# Patient Record
Sex: Female | Born: 1980
Health system: Southern US, Community
[De-identification: ages and names within clinical notes are randomized; demographics above are authoritative.]

## PROBLEM LIST (undated history)

## (undated) ENCOUNTER — Emergency Department (HOSPITAL_COMMUNITY): Admission: EM | Payer: Self-pay

## (undated) DIAGNOSIS — F32A Depression, unspecified: Secondary | ICD-10-CM

## (undated) DIAGNOSIS — J45909 Unspecified asthma, uncomplicated: Secondary | ICD-10-CM

## (undated) DIAGNOSIS — I10 Essential (primary) hypertension: Secondary | ICD-10-CM

## (undated) DIAGNOSIS — F329 Major depressive disorder, single episode, unspecified: Secondary | ICD-10-CM

## (undated) DIAGNOSIS — I639 Cerebral infarction, unspecified: Secondary | ICD-10-CM

## (undated) DIAGNOSIS — M109 Gout, unspecified: Secondary | ICD-10-CM

## (undated) HISTORY — DX: Cerebral infarction, unspecified: I63.9

---

## 2017-07-14 ENCOUNTER — Encounter (HOSPITAL_COMMUNITY): Payer: Self-pay

## 2017-07-14 ENCOUNTER — Emergency Department (HOSPITAL_COMMUNITY)
Admission: EM | Admit: 2017-07-14 | Discharge: 2017-07-14 | Disposition: A | Discharge status: Home / Self Care | Payer: Self-pay | Attending: Emergency Medicine | Admitting: Emergency Medicine

## 2017-07-14 ENCOUNTER — Other Ambulatory Visit: Payer: Self-pay

## 2017-07-14 ENCOUNTER — Emergency Department (HOSPITAL_COMMUNITY): Payer: Self-pay

## 2017-07-14 DIAGNOSIS — R51 Headache: Secondary | ICD-10-CM

## 2017-07-14 DIAGNOSIS — I10 Essential (primary) hypertension: Secondary | ICD-10-CM | POA: Insufficient documentation

## 2017-07-14 DIAGNOSIS — H538 Other visual disturbances: Secondary | ICD-10-CM | POA: Insufficient documentation

## 2017-07-14 DIAGNOSIS — Z760 Encounter for issue of repeat prescription: Secondary | ICD-10-CM

## 2017-07-14 DIAGNOSIS — F17210 Nicotine dependence, cigarettes, uncomplicated: Secondary | ICD-10-CM | POA: Insufficient documentation

## 2017-07-14 DIAGNOSIS — H53149 Visual discomfort, unspecified: Secondary | ICD-10-CM | POA: Insufficient documentation

## 2017-07-14 DIAGNOSIS — J4521 Mild intermittent asthma with (acute) exacerbation: Secondary | ICD-10-CM

## 2017-07-14 DIAGNOSIS — R05 Cough: Secondary | ICD-10-CM | POA: Insufficient documentation

## 2017-07-14 DIAGNOSIS — Z79899 Other long term (current) drug therapy: Secondary | ICD-10-CM | POA: Insufficient documentation

## 2017-07-14 DIAGNOSIS — J069 Acute upper respiratory infection, unspecified: Secondary | ICD-10-CM

## 2017-07-14 DIAGNOSIS — R519 Headache, unspecified: Secondary | ICD-10-CM

## 2017-07-14 HISTORY — DX: Essential (primary) hypertension: I10

## 2017-07-14 HISTORY — DX: Depression, unspecified: F32.A

## 2017-07-14 HISTORY — DX: Major depressive disorder, single episode, unspecified: F32.9

## 2017-07-14 HISTORY — DX: Gout, unspecified: M10.9

## 2017-07-14 HISTORY — DX: Unspecified asthma, uncomplicated: J45.909

## 2017-07-14 MED ORDER — PREDNISONE 20 MG PO TABS
60.0000 mg | ORAL_TABLET | Freq: Once | ORAL | Status: AC
  Administered 2017-07-14: 60 mg via ORAL
  Filled 2017-07-14: qty 3

## 2017-07-14 MED ORDER — ALBUTEROL SULFATE HFA 108 (90 BASE) MCG/ACT IN AERS
2.0000 | INHALATION_SPRAY | RESPIRATORY_TRACT | Status: DC | PRN
  Administered 2017-07-14: 2 via RESPIRATORY_TRACT
  Filled 2017-07-14: qty 6.7

## 2017-07-14 MED ORDER — METOCLOPRAMIDE HCL 5 MG/ML IJ SOLN
10.0000 mg | Freq: Once | INTRAMUSCULAR | Status: AC
  Administered 2017-07-14: 10 mg via INTRAVENOUS
  Filled 2017-07-14: qty 2

## 2017-07-14 MED ORDER — ASPIRIN-ACETAMINOPHEN-CAFFEINE 250-250-65 MG PO TABS: 1.0000 | ORAL_TABLET | Freq: Four times a day (QID) | ORAL | 0 refills | Status: DC | PRN

## 2017-07-14 MED ORDER — IPRATROPIUM BROMIDE 0.02 % IN SOLN
0.5000 mg | Freq: Once | RESPIRATORY_TRACT | Status: AC
  Administered 2017-07-14: 0.5 mg via RESPIRATORY_TRACT
  Filled 2017-07-14: qty 2.5

## 2017-07-14 MED ORDER — SODIUM CHLORIDE 0.9 % IV BOLUS (SEPSIS)
500.0000 mL | Freq: Once | INTRAVENOUS | Status: AC
  Administered 2017-07-14: 500 mL via INTRAVENOUS

## 2017-07-14 MED ORDER — DIPHENHYDRAMINE HCL 50 MG/ML IJ SOLN
25.0000 mg | Freq: Once | INTRAMUSCULAR | Status: AC
  Administered 2017-07-14: 25 mg via INTRAVENOUS
  Filled 2017-07-14: qty 1

## 2017-07-14 MED ORDER — ALBUTEROL SULFATE (2.5 MG/3ML) 0.083% IN NEBU
5.0000 mg | INHALATION_SOLUTION | Freq: Once | RESPIRATORY_TRACT | Status: AC
  Administered 2017-07-14: 5 mg via RESPIRATORY_TRACT
  Filled 2017-07-14: qty 6

## 2017-07-14 MED ORDER — LISINOPRIL 20 MG PO TABS
20.0000 mg | ORAL_TABLET | Freq: Once | ORAL | Status: AC
  Administered 2017-07-14: 20 mg via ORAL
  Filled 2017-07-14: qty 1

## 2017-07-14 MED ORDER — LISINOPRIL-HYDROCHLOROTHIAZIDE 20-12.5 MG PO TABS: 1.0000 | ORAL_TABLET | Freq: Every day | ORAL | 0 refills | Status: DC

## 2017-07-14 MED ORDER — AEROCHAMBER PLUS FLO-VU LARGE MISC
1.0000 | Freq: Once | Status: AC
  Administered 2017-07-14: 1
  Filled 2017-07-14 (×2): qty 1

## 2017-07-14 MED ORDER — PREDNISONE 10 MG PO TABS: 40.0000 mg | ORAL_TABLET | Freq: Every day | ORAL | 0 refills | Status: DC

## 2017-07-14 NOTE — ED Triage Notes (Addendum)
Patient c/o intermittent headache x 1 week, sensitivity to light. Patient has a productive cough with yellow sputum x 3 days. Patient states she is out of her Albuterol inhaler. Patient did not want a neb treatment because she gave herself one at home at 0600 today.  Patient was hypertensive in triage, but states she is out of her medication (Lisinopril and HCTZ)

## 2017-07-14 NOTE — ED Notes (Signed)
PA made aware patient refused chest XR.

## 2017-07-14 NOTE — ED Notes (Signed)
ED Provider at bedside. 

## 2017-07-14 NOTE — Discharge Instructions (Signed)
As discussed, use your inhaler with spacer as instructed and as needed.  Try Excedrin for headache or ibuprofen or Tylenol as needed.  Make sure that you stay well-hydrated.  Prednisone 40 mg daily for the next 4 days starting tomorrow.  [facility].  Return if symptoms worsen or new concerning symptoms in the meantime.

## 2017-07-14 NOTE — ED Provider Notes (Signed)
[facility]-EMERGENCY DEPT Provider Note   CSN: [id number] Arrival date & time: 07/14/17  9562     History   Chief Complaint Chief Complaint  Patient presents with  . Headache  . Wheezing  . Medication Refill  . Hypertension    HPI Suzanne Graham is a 37 y.o. female with past medical history significant for asthma, hypertension (noncompliant with medication), and anxiety/depression presenting with 1 week of intermittent left-sided throbbing headache with light sensitivity and 3 days of productive cough.  She also reports associated right-sided blurred vision.  Has had headaches like this before but not lasting as long or as frequent. She has tried ibuprofen for her headaches with relief for a few hours but the headache returns. She started having nasal congestion 5 days ago which improved and now she has been having a productive cough over the last 3 days. she has tried nebulizing treatment at home early this am but denies wheezing or shortness of breath.  She reports being out of her albuterol inhaler. She recently moved from [place] and does not have a PCP in the area.  She is currently out of her lisinopril/HCTZ 20/12.5. Denies chest pain, fever, chills, myalgias, nausea, vomiting.   HPI  Past Medical History:  Diagnosis Date  . Asthma   . Depression   . Gout   . Hypertension     There are no active problems to display for this patient.   History reviewed. No pertinent surgical history.  OB History    No data available       Home Medications    Prior to Admission medications   Medication Sig Start Date End Date Taking? Authorizing Provider  albuterol (PROVENTIL HFA;VENTOLIN HFA) 108 (90 Base) MCG/ACT inhaler Inhale 2 puffs into the lungs every 4 (four) hours as needed for wheezing or shortness of breath.   Yes [provider]  ibuprofen (ADVIL,MOTRIN) 200 MG tablet Take 400 mg by mouth 3 (three) times daily as needed.   Yes [provider]  meloxicam (MOBIC) 15 MG tablet Take 7.5 mg by mouth daily as needed for pain.   Yes [provider]  aspirin-acetaminophen-caffeine (EXCEDRIN MIGRAINE) [phone] MG tablet Take 1 tablet by mouth every 6 (six) hours as needed for headache or migraine. 07/14/17   Georgiana Shore, PA-C  lisinopril-hydrochlorothiazide (ZESTORETIC) 20-12.5 MG tablet Take 1 tablet by mouth daily. 07/14/17 08/13/17  Mathews Robinsons B, PA-C  predniSONE (DELTASONE) 10 MG tablet Take 4 tablets (40 mg total) by mouth daily for 4 days. 07/14/17 07/18/17  Georgiana Shore, PA-C    Family History Family History  Problem Relation Age of Onset  . Heart failure Mother   . Hypertension Mother     Social History Social History   Tobacco Use  . Smoking status: Current Every Day Smoker    Packs/day: 0.50    Types: Cigarettes  . Smokeless tobacco: Never Used  Substance Use Topics  . Alcohol use: Yes    Comment: occasinally  . Drug use: No     Allergies   Patient has no known allergies.   Review of Systems Review of Systems  Constitutional: Negative for chills, diaphoresis and fever.  HENT: Negative for congestion, ear pain, facial swelling, sinus pressure, sinus pain and sore throat.   Eyes: Positive for photophobia and visual disturbance. Negative for pain and redness.  Respiratory: Positive for cough. Negative for choking, chest tightness, shortness of breath, wheezing and stridor.   Cardiovascular: Negative  for chest pain, palpitations and leg swelling.  Gastrointestinal: Negative for abdominal distention, abdominal pain, nausea and vomiting.  Genitourinary: Negative for difficulty urinating, dysuria, flank pain and hematuria.  Musculoskeletal: Negative for gait problem, myalgias, neck pain and neck stiffness.  Skin: Negative for color change, pallor and rash.  Neurological: Positive for headaches. Negative for dizziness, tremors, seizures, syncope, facial asymmetry, speech  difficulty, weakness, light-headedness and numbness.  Psychiatric/Behavioral: Negative for behavioral problems.     Physical Exam Updated Vital Signs BP (!) 180/102   Pulse 75   Temp 98 F (36.7 C) (Oral)   Resp 16   Ht 5\' 4"  (1.626 m)   Wt 108.9 kg (240 lb)   LMP 07/14/2017   SpO2 99%   BMI 41.20 kg/m   Physical Exam  Constitutional: She is oriented to person, place, and time. She appears well-developed and well-nourished.  Non-toxic appearance. She does not appear ill. No distress.  Afebrile, nontoxic appearing in no acute distress, sitting comfortably in bed  HENT:  Head: Normocephalic and atraumatic.  Mouth/Throat: Oropharynx is clear and moist.  Eyes: EOM are normal. Pupils are equal, round, and reactive to light. Right eye exhibits normal extraocular motion and no nystagmus. Left eye exhibits normal extraocular motion and no nystagmus.  No photophobia on exam  Neck: Normal range of motion. Neck supple.  Cardiovascular: Normal rate, regular rhythm, normal heart sounds and intact distal pulses.  No murmur heard. Pulmonary/Chest: Effort normal. No stridor. No respiratory distress. She has wheezes. She has no rales. She exhibits no tenderness.  Diffuse expiratory wheezing bilaterally.  Abdominal: She exhibits no distension.  Musculoskeletal: Normal range of motion. She exhibits no edema or tenderness.  Neurological: She is alert and oriented to person, place, and time. She has normal strength. She is not disoriented. No cranial nerve deficit or sensory deficit. She displays a negative Romberg sign. Coordination and gait normal.  Neurologic Exam:  - Mental status: Patient is alert and cooperative. Fluent speech and words are clear. Coherent thought processes and insight is good. Patient is oriented x 4 to person, place, time and event.  - Cranial nerves:  CN III, IV, VI: pupils equally round, reactive to light both direct and conscensual. Full extra-ocular movement. CN V: motor  temporalis and masseter strength intact. CN VII : muscles of facial expression intact. CN X :  midline uvula. XI strength of sternocleidomastoid and trapezius muscles 5/5, XII: tongue is midline when protruded. - Motor: No involuntary movements. Muscle tone and bulk normal throughout. Muscle strength is 5/5 in bilateral shoulder abduction, elbow flexion and extension, grip, hip extension, flexion, leg flexion and extension, ankle dorsiflexion and plantar flexion.  - Sensory:light tough sensation intact in all extremities.  - Cerebellar: rapid alternating movements and point to point movement intact in upper and lower extremities. Normal stance and gait.  Skin: Skin is warm and dry. She is not diaphoretic. No erythema. No pallor.  Psychiatric: She has a normal mood and affect. Her behavior is normal.  Nursing note and vitals reviewed.    ED Treatments / Results  Labs (all labs ordered are listed, but only abnormal results are displayed) Labs Reviewed - No data to display  EKG  EKG Interpretation None       Radiology No results found.  Procedures Procedures (including critical care time)  Medications Ordered in ED Medications  albuterol (PROVENTIL HFA;VENTOLIN HFA) 108 (90 Base) MCG/ACT inhaler 2 puff (not administered)  AEROCHAMBER PLUS FLO-VU LARGE MISC 1 each (not  administered)  albuterol (PROVENTIL) (2.5 MG/3ML) 0.083% nebulizer solution 5 mg (5 mg Nebulization Given 07/14/17 1734)  ipratropium (ATROVENT) nebulizer solution 0.5 mg (0.5 mg Nebulization Given 07/14/17 1734)  predniSONE (DELTASONE) tablet 60 mg (60 mg Oral Given 07/14/17 1709)  sodium chloride 0.9 % bolus 500 mL (500 mLs Intravenous New Bag/Given 07/14/17 1716)  metoCLOPramide (REGLAN) injection 10 mg (10 mg Intravenous Given 07/14/17 1716)  diphenhydrAMINE (BENADRYL) injection 25 mg (25 mg Intravenous Given 07/14/17 1716)  lisinopril (PRINIVIL,ZESTRIL) tablet 20 mg (20 mg Oral Given 07/14/17 1709)     Initial Impression /  Assessment and Plan / ED Course  I have reviewed the triage vital signs and the nursing notes.  Pertinent labs & imaging results that were available during my care of the patient were reviewed by me and considered in my medical decision making (see chart for details).     Patient presenting with intermittent left-sided throbbing headache with right-sided blurred vision.  She also complains of productive cough for the last 3 days.  Patient recently moved to the area does not have a primary care provider and is noncompliant with medications at this time including antihypertensive and albuterol inhaler.  Reassuring exam, normal neuro Diffuse wheezing bilaterally Elevated blood pressure noted.  We will give migraine cocktail, nebulizing treatment and home antihypertensive meds and reassess  On reassessment, patient reported significant improvement and she stated that she was breathing much better and her headache had completely resolved. Denies any visual changes. Sates that the blurred vision is intermittent and only with the headache. Lungs CTA bilaterally. Blood pressure improving.  Patient was provided with short term refill of her antihypertensive and close follow-up with the wellness center. She was provided with a spacer and instructed on its use.  Discharge home with short course of prednisone and close follow-up with PCP.  Discussed strict return precautions and advised to return to the emergency department if experiencing any new or worsening symptoms. Instructions were understood and patient agreed with discharge plan.  Final Clinical Impressions(s) / ED Diagnoses   Final diagnoses:  Mild intermittent asthma with exacerbation  Medication refill  Viral upper respiratory tract infection  Acute nonintractable headache, unspecified headache type    ED Discharge Orders        Ordered    lisinopril-hydrochlorothiazide (ZESTORETIC) 20-12.5 MG tablet  Daily     07/14/17 1859     predniSONE (DELTASONE) 10 MG tablet  Daily     07/14/17 1859    aspirin-acetaminophen-caffeine (EXCEDRIN MIGRAINE) 250-250-65 MG tablet  Every 6 hours PRN     07/14/17 1859       Georgiana Shore, PA-C 07/14/17 Acquanetta Belling, MD 07/16/17 1424

## 2017-07-17 ENCOUNTER — Other Ambulatory Visit: Payer: Self-pay

## 2017-07-17 ENCOUNTER — Emergency Department (HOSPITAL_COMMUNITY): Payer: Self-pay

## 2017-07-17 ENCOUNTER — Inpatient Hospital Stay (HOSPITAL_COMMUNITY)
Admission: EM | Admit: 2017-07-17 | Discharge: 2017-07-18 | DRG: 066 | Disposition: A | Discharge status: Home Health | Payer: Self-pay | Attending: Internal Medicine | Admitting: Internal Medicine

## 2017-07-17 ENCOUNTER — Encounter (HOSPITAL_COMMUNITY): Payer: Self-pay | Admitting: Emergency Medicine

## 2017-07-17 DIAGNOSIS — R402362 Coma scale, best motor response, obeys commands, at arrival to emergency department: Secondary | ICD-10-CM | POA: Diagnosis present

## 2017-07-17 DIAGNOSIS — I10 Essential (primary) hypertension: Secondary | ICD-10-CM | POA: Diagnosis present

## 2017-07-17 DIAGNOSIS — M722 Plantar fascial fibromatosis: Secondary | ICD-10-CM | POA: Diagnosis present

## 2017-07-17 DIAGNOSIS — M109 Gout, unspecified: Secondary | ICD-10-CM | POA: Diagnosis present

## 2017-07-17 DIAGNOSIS — I63432 Cerebral infarction due to embolism of left posterior cerebral artery: Secondary | ICD-10-CM

## 2017-07-17 DIAGNOSIS — Z79899 Other long term (current) drug therapy: Secondary | ICD-10-CM

## 2017-07-17 DIAGNOSIS — R402252 Coma scale, best verbal response, oriented, at arrival to emergency department: Secondary | ICD-10-CM | POA: Diagnosis present

## 2017-07-17 DIAGNOSIS — E669 Obesity, unspecified: Secondary | ICD-10-CM | POA: Diagnosis present

## 2017-07-17 DIAGNOSIS — H53451 Other localized visual field defect, right eye: Secondary | ICD-10-CM

## 2017-07-17 DIAGNOSIS — R29702 NIHSS score 2: Secondary | ICD-10-CM | POA: Diagnosis present

## 2017-07-17 DIAGNOSIS — R8271 Bacteriuria: Secondary | ICD-10-CM | POA: Diagnosis present

## 2017-07-17 DIAGNOSIS — J45909 Unspecified asthma, uncomplicated: Secondary | ICD-10-CM | POA: Diagnosis present

## 2017-07-17 DIAGNOSIS — F17210 Nicotine dependence, cigarettes, uncomplicated: Secondary | ICD-10-CM | POA: Diagnosis present

## 2017-07-17 DIAGNOSIS — Z6839 Body mass index (BMI) 39.0-39.9, adult: Secondary | ICD-10-CM

## 2017-07-17 DIAGNOSIS — E876 Hypokalemia: Secondary | ICD-10-CM | POA: Diagnosis present

## 2017-07-17 DIAGNOSIS — I639 Cerebral infarction, unspecified: Secondary | ICD-10-CM | POA: Diagnosis present

## 2017-07-17 DIAGNOSIS — R402142 Coma scale, eyes open, spontaneous, at arrival to emergency department: Secondary | ICD-10-CM | POA: Diagnosis present

## 2017-07-17 DIAGNOSIS — F329 Major depressive disorder, single episode, unspecified: Secondary | ICD-10-CM | POA: Diagnosis present

## 2017-07-17 DIAGNOSIS — H53461 Homonymous bilateral field defects, right side: Secondary | ICD-10-CM | POA: Diagnosis present

## 2017-07-17 DIAGNOSIS — I63532 Cerebral infarction due to unspecified occlusion or stenosis of left posterior cerebral artery: Principal | ICD-10-CM | POA: Diagnosis present

## 2017-07-17 LAB — URINALYSIS, ROUTINE W REFLEX MICROSCOPIC
Bilirubin Urine: NEGATIVE
Glucose, UA: NEGATIVE mg/dL
Ketones, ur: NEGATIVE mg/dL
Nitrite: POSITIVE — AB
PROTEIN: NEGATIVE mg/dL
SPECIFIC GRAVITY, URINE: 1.014 (ref 1.005–1.030)
pH: 5 (ref 5.0–8.0)

## 2017-07-17 LAB — CBC
HCT: 37.1 % (ref 36.0–46.0)
HEMATOCRIT: 38.3 % (ref 36.0–46.0)
Hemoglobin: 12.2 g/dL (ref 12.0–15.0)
Hemoglobin: 11.8 g/dL — ABNORMAL LOW (ref 12.0–15.0)
MCH: 24.2 pg — ABNORMAL LOW (ref 26.0–34.0)
MCH: 24.1 pg — ABNORMAL LOW (ref 26.0–34.0)
MCHC: 31.9 g/dL (ref 30.0–36.0)
MCHC: 31.8 g/dL (ref 30.0–36.0)
MCV: 75.8 fL — ABNORMAL LOW (ref 78.0–100.0)
MCV: 75.9 fL — ABNORMAL LOW (ref 78.0–100.0)
PLATELETS: 240 10*3/uL (ref 150–400)
Platelets: 236 10*3/uL (ref 150–400)
RBC: 5.05 MIL/uL (ref 3.87–5.11)
RBC: 4.89 MIL/uL (ref 3.87–5.11)
RDW: 14 % (ref 11.5–15.5)
RDW: 13.9 % (ref 11.5–15.5)
WBC: 7.3 10*3/uL (ref 4.0–10.5)
WBC: 6.8 10*3/uL (ref 4.0–10.5)

## 2017-07-17 LAB — COMPREHENSIVE METABOLIC PANEL
ALBUMIN: 4 g/dL (ref 3.5–5.0)
ALT: 16 U/L (ref 14–54)
AST: 18 U/L (ref 15–41)
Alkaline Phosphatase: 41 U/L (ref 38–126)
Anion gap: 8 (ref 5–15)
BUN: 11 mg/dL (ref 6–20)
CHLORIDE: 102 mmol/L (ref 101–111)
CO2: 28 mmol/L (ref 22–32)
Calcium: 9.8 mg/dL (ref 8.9–10.3)
Creatinine, Ser: 0.99 mg/dL (ref 0.44–1.00)
GFR calc Af Amer: >60 mL/min (ref >60)
GFR calc non Af Amer: >60 mL/min (ref >60)
GLUCOSE: 92 mg/dL (ref 65–99)
POTASSIUM: 3.2 mmol/L — AB (ref 3.5–5.1)
Sodium: 138 mmol/L (ref 135–145)
Total Bilirubin: 0.8 mg/dL (ref 0.3–1.2)
Total Protein: 8.2 g/dL — ABNORMAL HIGH (ref 6.5–8.1)

## 2017-07-17 LAB — DIFFERENTIAL
BASOS ABS: 0 10*3/uL (ref 0.0–0.1)
Basophils Relative: 0 %
EOS ABS: 0.2 10*3/uL (ref 0.0–0.7)
Eosinophils Relative: 3 %
Lymphocytes Relative: 27 %
Lymphs Abs: 2 10*3/uL (ref 0.7–4.0)
MONO ABS: 0.7 10*3/uL (ref 0.1–1.0)
Monocytes Relative: 10 %
Neutro Abs: 4.4 10*3/uL (ref 1.7–7.7)
Neutrophils Relative %: 60 %

## 2017-07-17 LAB — RAPID URINE DRUG SCREEN, HOSP PERFORMED
Amphetamines: NOT DETECTED
BARBITURATES: NOT DETECTED
BENZODIAZEPINES: NOT DETECTED
COCAINE: NOT DETECTED
Opiates: NOT DETECTED
TETRAHYDROCANNABINOL: NOT DETECTED

## 2017-07-17 LAB — I-STAT CHEM 8, ED
BUN: 8 mg/dL (ref 6–20)
CHLORIDE: 100 mmol/L — AB (ref 101–111)
Calcium, Ion: 1.22 mmol/L (ref 1.15–1.40)
Creatinine, Ser: 0.9 mg/dL (ref 0.44–1.00)
GLUCOSE: 92 mg/dL (ref 65–99)
HCT: 42 % (ref 36.0–46.0)
Hemoglobin: 14.3 g/dL (ref 12.0–15.0)
POTASSIUM: 3.2 mmol/L — AB (ref 3.5–5.1)
Sodium: 141 mmol/L (ref 135–145)
TCO2: 27 mmol/L (ref 22–32)

## 2017-07-17 LAB — ETHANOL

## 2017-07-17 LAB — APTT: APTT: 32 s (ref 24–36)

## 2017-07-17 LAB — I-STAT TROPONIN, ED: Troponin i, poc: 0.01 ng/mL (ref 0.00–0.08)

## 2017-07-17 LAB — CREATININE, SERUM
CREATININE: 0.85 mg/dL (ref 0.44–1.00)
GFR calc Af Amer: >60 mL/min (ref >60)

## 2017-07-17 LAB — PROTIME-INR
INR: 1.07
PROTHROMBIN TIME: 13.8 s (ref 11.4–15.2)

## 2017-07-17 MED ORDER — ACETAMINOPHEN 325 MG PO TABS
650.0000 mg | ORAL_TABLET | ORAL | Status: DC | PRN
  Administered 2017-07-18: 650 mg via ORAL
  Filled 2017-07-17: qty 2

## 2017-07-17 MED ORDER — COLCHICINE 0.6 MG PO TABS
1.2000 mg | ORAL_TABLET | Freq: Once | ORAL | Status: AC
  Administered 2017-07-17: 1.2 mg via ORAL
  Filled 2017-07-17: qty 2

## 2017-07-17 MED ORDER — ACETAMINOPHEN 650 MG RE SUPP: 650.0000 mg | RECTAL | Status: DC | PRN

## 2017-07-17 MED ORDER — POTASSIUM CHLORIDE CRYS ER 20 MEQ PO TBCR
40.0000 meq | EXTENDED_RELEASE_TABLET | Freq: Once | ORAL | Status: AC
  Administered 2017-07-17: 40 meq via ORAL
  Filled 2017-07-17: qty 2

## 2017-07-17 MED ORDER — HEPARIN SODIUM (PORCINE) 5000 UNIT/ML IJ SOLN
5000.0000 [IU] | Freq: Three times a day (TID) | INTRAMUSCULAR | Status: DC
  Administered 2017-07-18 (×2): 5000 [IU] via SUBCUTANEOUS
  Filled 2017-07-17 (×3): qty 1

## 2017-07-17 MED ORDER — NICOTINE 14 MG/24HR TD PT24: 14.0000 mg | MEDICATED_PATCH | Freq: Every day | TRANSDERMAL | Status: DC | PRN

## 2017-07-17 MED ORDER — HYDROCHLOROTHIAZIDE 12.5 MG PO CAPS
12.5000 mg | ORAL_CAPSULE | Freq: Every day | ORAL | Status: DC
  Administered 2017-07-18: 12.5 mg via ORAL
  Filled 2017-07-17: qty 1

## 2017-07-17 MED ORDER — SENNOSIDES-DOCUSATE SODIUM 8.6-50 MG PO TABS: 1.0000 | ORAL_TABLET | Freq: Every evening | ORAL | Status: DC | PRN

## 2017-07-17 MED ORDER — DEXTROSE 5 % IV SOLN
1.0000 g | Freq: Once | INTRAVENOUS | Status: AC
  Administered 2017-07-17: 1 g via INTRAVENOUS
  Filled 2017-07-17: qty 10

## 2017-07-17 MED ORDER — ASPIRIN 325 MG PO TABS
325.0000 mg | ORAL_TABLET | Freq: Every day | ORAL | Status: DC
  Administered 2017-07-18: 325 mg via ORAL
  Filled 2017-07-17: qty 1

## 2017-07-17 MED ORDER — LISINOPRIL 20 MG PO TABS
20.0000 mg | ORAL_TABLET | Freq: Every day | ORAL | Status: DC
  Administered 2017-07-18: 20 mg via ORAL
  Filled 2017-07-17: qty 1

## 2017-07-17 MED ORDER — PRAVASTATIN SODIUM 40 MG PO TABS: 80.0000 mg | ORAL_TABLET | Freq: Every day | ORAL | Status: DC

## 2017-07-17 MED ORDER — STROKE: EARLY STAGES OF RECOVERY BOOK
Freq: Once | Status: AC
  Administered 2017-07-17: 23:00:00
  Filled 2017-07-17: qty 1

## 2017-07-17 MED ORDER — SODIUM CHLORIDE 0.9 % IV SOLN
INTRAVENOUS | Status: AC
  Administered 2017-07-18: 01:00:00 via INTRAVENOUS

## 2017-07-17 MED ORDER — HYDRALAZINE HCL 20 MG/ML IJ SOLN: 10.0000 mg | Freq: Three times a day (TID) | INTRAMUSCULAR | Status: DC | PRN

## 2017-07-17 MED ORDER — IOPAMIDOL (ISOVUE-370) INJECTION 76%
INTRAVENOUS | Status: AC
  Administered 2017-07-17: 100 mL via INTRAVENOUS
  Filled 2017-07-17: qty 100

## 2017-07-17 MED ORDER — LISINOPRIL-HYDROCHLOROTHIAZIDE 20-12.5 MG PO TABS
1.0000 | ORAL_TABLET | Freq: Every day | ORAL | Status: DC
Start: 2017-07-17 — End: 2017-07-17

## 2017-07-17 MED ORDER — ASPIRIN 81 MG PO CHEW
324.0000 mg | CHEWABLE_TABLET | Freq: Once | ORAL | Status: AC
  Administered 2017-07-17: 324 mg via ORAL
  Filled 2017-07-17: qty 4

## 2017-07-17 MED ORDER — ACETAMINOPHEN 160 MG/5ML PO SOLN: 650.0000 mg | ORAL | Status: DC | PRN

## 2017-07-17 MED ORDER — PROCHLORPERAZINE MALEATE 10 MG PO TABS
10.0000 mg | ORAL_TABLET | Freq: Once | ORAL | Status: AC
  Administered 2017-07-17: 10 mg via ORAL
  Filled 2017-07-17: qty 1

## 2017-07-17 MED ORDER — LORAZEPAM 2 MG/ML IJ SOLN
1.0000 mg | Freq: Once | INTRAMUSCULAR | Status: AC
  Administered 2017-07-17: 1 mg via INTRAVENOUS
  Filled 2017-07-17: qty 1

## 2017-07-17 NOTE — H&P (Signed)
[facility] History and Physical  [name] ZOX:[id number] DOB: May 15, 1981 DOA: 07/17/2017  Referring physician:  PCP: Patient, No Pcp Per   Chief Complaint: "My vision got blurry."  HPI: Suzanne Graham is a 37 y.o. female with past medical history of asthma, depression, gout and hypertension patient states 2 days ago she woke up with blurry vision.  In her right visual field her vision went in and out the first day.  Then the visual loss became permanent.  No history of visual issues.  Family history of stroke.  Personal history of stroke.  Patient states he also has a headache.   ED Course: EDP consulted optho and neuro.  Review of Systems:  As per HPI otherwise 10 point review of systems negative.    Past Medical History:  Diagnosis Date  . Asthma   . Depression   . Gout   . Hypertension    History reviewed. No pertinent surgical history. Social History:  reports that she has been smoking cigarettes.  She has been smoking about 0.50 packs per day. she has never used smokeless tobacco. She reports that she drinks alcohol. She reports that she does not use drugs.  No Known Allergies  Family History  Problem Relation Age of Onset  . Heart failure Mother   . Hypertension Mother   . Stroke Neg Hx      Prior to Admission medications   Medication Sig Start Date End Date Taking? Authorizing Provider  albuterol (PROVENTIL HFA;VENTOLIN HFA) 108 (90 Base) MCG/ACT inhaler Inhale 2 puffs into the lungs every 4 (four) hours as needed for wheezing or shortness of breath.   Yes [provider]  ibuprofen (ADVIL,MOTRIN) 200 MG tablet Take 400 mg by mouth 3 (three) times daily as needed.   Yes [provider]  lisinopril-hydrochlorothiazide (ZESTORETIC) 20-12.5 MG tablet Take 1 tablet by mouth daily. 07/14/17 08/13/17 Yes Mathews Robinsons B, PA-C  meloxicam (MOBIC) 15 MG tablet Take 7.5 mg by mouth daily as needed for pain.   Yes [provider]    predniSONE (DELTASONE) 10 MG tablet Take 4 tablets (40 mg total) by mouth daily for 4 days. 07/14/17 07/18/17 Yes Georgiana Shore, PA-C  aspirin-acetaminophen-caffeine (EXCEDRIN MIGRAINE) [phone] MG tablet Take 1 tablet by mouth every 6 (six) hours as needed for headache or migraine. 07/14/17   Georgiana Shore, PA-C   Physical Exam: Vitals:   07/17/17 1703 07/17/17 1705 07/17/17 1824 07/17/17 1825  BP:  (!) 163/110 (!) 177/105   Pulse: 74 75 81 81  Resp: 19 15 (!) 21 (!) 21  Temp:      TempSrc:      SpO2: 100% 100% 100% 98%    Wt Readings from Last 3 Encounters:  07/14/17 108.9 kg (240 lb)    General:  Appears calm and comfortable; A&Ox3 Eyes:  PERRL, EOMI, normal lids, iris ENT:  grossly normal hearing, lips & tongue Neck:  no LAD, masses or thyromegaly Cardiovascular:  RRR, no m/r/g. No LE edema.  Respiratory:  CTA bilaterally, no w/r/r. Normal respiratory effort. Abdomen:  soft, ntnd Skin:  no rash or induration seen on limited exam Musculoskeletal:  grossly normal tone BUE/BLE Psychiatric:  grossly normal mood and affect, speech fluent and appropriate Neurologic:  CN 2-12 grossly intact, moves all extremities in coordinated fashion.          Labs on Admission:  Basic Metabolic Panel: Recent Labs  Lab 07/17/17 1638 07/17/17 1648  NA 138 141  K 3.2*  3.2*  CL 102 100*  CO2 28  --   GLUCOSE 92 92  BUN 11 8  CREATININE 0.99 0.90  CALCIUM 9.8  --    Liver Function Tests: Recent Labs  Lab 07/17/17 1638  AST 18  ALT 16  ALKPHOS 41  BILITOT 0.8  PROT 8.2*  ALBUMIN 4.0   No results for input(s): LIPASE, AMYLASE in the last 168 hours. No results for input(s): AMMONIA in the last 168 hours. CBC: Recent Labs  Lab 07/17/17 1638 07/17/17 1648  WBC 7.3  --   NEUTROABS 4.4  --   HGB 12.2 14.3  HCT 38.3 42.0  MCV 75.8*  --   PLT 240  --    Cardiac Enzymes: No results for input(s): CKTOTAL, CKMB, CKMBINDEX, TROPONINI in the last 168 hours.  BNP  (last 3 results) No results for input(s): BNP in the last 8760 hours.  ProBNP (last 3 results) No results for input(s): PROBNP in the last 8760 hours.   Serum creatinine: 0.9 mg/dL 16/04/9600/10/19 [id number] Estimated creatinine clearance: 102.2 mL/min  CBG: No results for input(s): GLUCAP in the last 168 hours.  Radiological Exams on Admission: Ct Angio Head W Or Wo Contrast  Result Date: 07/17/2017 CLINICAL DATA:  37 y/o  F; vision changes. EXAM: CT ANGIOGRAPHY HEAD AND NECK TECHNIQUE: Multidetector CT imaging of the head and neck was performed using the standard protocol during bolus administration of intravenous contrast. Multiplanar CT image reconstructions and MIPs were obtained to evaluate the vascular anatomy. Carotid stenosis measurements (when applicable) are obtained utilizing NASCET criteria, using the distal internal carotid diameter as the denominator. CONTRAST:  100mL ISOVUE-370 IOPAMIDOL (ISOVUE-370) INJECTION 76% COMPARISON:  Same-day MRI of the brain. FINDINGS: CTA NECK FINDINGS Aortic arch: Bovine variant branching. Imaged portion shows no evidence of aneurysm or dissection. No significant stenosis of the major arch vessel origins. Mild calcific atherosclerosis. Right carotid system: No evidence of dissection, stenosis (50% or greater) or occlusion. Left carotid system: No evidence of dissection, stenosis (50% or greater) or occlusion. Vertebral arteries: Left dominant. No evidence of dissection, stenosis (50% or greater) or occlusion. Skeleton: Negative. Other neck: Negative. Upper chest: 13 mm left cheek dermal cyst. Review of the MIP images confirms the above findings CTA HEAD FINDINGS Anterior circulation: No significant stenosis, proximal occlusion, aneurysm, or vascular malformation. Posterior circulation: Bilateral P2 short segments of occlusion/near occlusion with distal reconstitution (series 12, image 23 and 17 as well as series 8, image 103). Otherwise no large vessel occlusion,  aneurysm, or significant stenosis is identified. Venous sinuses: As permitted by contrast timing, patent. Anatomic variants: Anterior and bilateral posterior communicating arteries are present. Delayed phase: No abnormal intracranial enhancement. Review of the MIP images confirms the above findings IMPRESSION: 1. Negative CTA of the neck. 2. Bilateral P2 short segments of occlusion/near occlusion with distal reconstitution. Given patient's age and symmetry consideration for vasculitis, reversible cerebral vasoconstriction syndrome, or vasospasm should be considered in addition to thromboembolic disease. 3. Otherwise negative CTA of the head. These results were called by telephone at the time of interpretation on 07/17/2017 at 6:19 pm to Dr. Erma HeritageIsaacs , who verbally acknowledged these results. Electronically Signed   By: Mitzi HansenLance  Furusawa-Stratton M.D.   On: 07/17/2017 18:31   Ct Head Wo Contrast  Result Date: 07/17/2017 CLINICAL DATA:  Right eye blurred vision. EXAM: CT HEAD WITHOUT CONTRAST TECHNIQUE: Contiguous axial images were obtained from the base of the skull through the vertex without intravenous contrast. COMPARISON:  None. FINDINGS:  Brain: Low-density noted in the left occipital lobe compatible with infarction. No hemorrhage or hydrocephalus. No mass lesion. Vascular: No hyperdense vessel or unexpected calcification. Skull: No acute calvarial abnormality. Sinuses/Orbits: No acute finding. Other: None IMPRESSION: Low-density in the left occipital lobe compatible with acute to subacute infarct. Electronically Signed   By: Charlett Nose M.D.   On: 07/17/2017 15:49   Ct Angio Neck W Or Wo Contrast  Result Date: 07/17/2017 CLINICAL DATA:  37 y/o  F; vision changes. EXAM: CT ANGIOGRAPHY HEAD AND NECK TECHNIQUE: Multidetector CT imaging of the head and neck was performed using the standard protocol during bolus administration of intravenous contrast. Multiplanar CT image reconstructions and MIPs were obtained to  evaluate the vascular anatomy. Carotid stenosis measurements (when applicable) are obtained utilizing NASCET criteria, using the distal internal carotid diameter as the denominator. CONTRAST:  ISOVUE-370 IOPAMIDOL (ISOVUE-370) INJECTION 76% COMPARISON:  Same-day MRI of the brain. FINDINGS: CTA NECK FINDINGS Aortic arch: Bovine variant branching. Imaged portion shows no evidence of aneurysm or dissection. No significant stenosis of the major arch vessel origins. Mild calcific atherosclerosis. Right carotid system: No evidence of dissection, stenosis (50% or greater) or occlusion. Left carotid system: No evidence of dissection, stenosis (50% or greater) or occlusion. Vertebral arteries: Left dominant. No evidence of dissection, stenosis (50% or greater) or occlusion. Skeleton: Negative. Other neck: Negative. Upper chest: 13 mm left cheek dermal cyst. Review of the MIP images confirms the above findings CTA HEAD FINDINGS Anterior circulation: No significant stenosis, proximal occlusion, aneurysm, or vascular malformation. Posterior circulation: Bilateral P2 short segments of occlusion/near occlusion with distal reconstitution (series 12, image 23 and 17 as well as series 8, image 103). Otherwise no large vessel occlusion, aneurysm, or significant stenosis is identified. Venous sinuses: As permitted by contrast timing, patent. Anatomic variants: Anterior and bilateral posterior communicating arteries are present. Delayed phase: No abnormal intracranial enhancement. Review of the MIP images confirms the above findings IMPRESSION: 1. Negative CTA of the neck. 2. Bilateral P2 short segments of occlusion/near occlusion with distal reconstitution. Given patient's age and symmetry consideration for vasculitis, reversible cerebral vasoconstriction syndrome, or vasospasm should be considered in addition to thromboembolic disease. 3. Otherwise negative CTA of the head. These results were called by telephone at the time of  interpretation on 07/17/2017 at 6:19 pm to Dr. Erma Heritage , who verbally acknowledged these results. Electronically Signed   By: Mitzi Hansen M.D.   On: 07/17/2017 18:31   Mr Brain Wo Contrast  Result Date: 07/17/2017 CLINICAL DATA:  37 y/o F; right-sided eye blurriness and visual field changes. EXAM: MRI HEAD WITHOUT CONTRAST TECHNIQUE: Multiplanar, multiecho pulse sequences of the brain and surrounding structures were obtained without intravenous contrast. COMPARISON:  07/17/2016 CT head.  CT angiogram of head. FINDINGS: Brain: Multiple foci of reduced diffusion are present throughout the left PCA distribution involving left medial temporal lobe, occipital lobe, left splenium of corpus callosum, and left thalamus compatible with acute/early subacute infarction. Areas of infarction demonstrate T2 FLAIR hyperintense signal abnormality. No significant mass effect or hemorrhage. Fewnonspecific foci of T2 FLAIR hyperintense signal abnormality in subcortical and periventricular white matter are compatible withmildchronic microvascular ischemic changes for age. No extra-axial collection, hydrocephalus, or effacement of basilar cisterns. No herniation. Vascular: Refer to same-day CT angiogram of head. Skull and upper cervical spine: Normal marrow signal. Sinuses/Orbits: Moderate maxillary and anterior ethmoid sinus mucosal thickening. No abnormal signal of mastoid air cells. Orbits are unremarkable. Other: None. IMPRESSION: Several areas of acute/early  subacute infarction within the left posterior cerebral artery distribution. No hemorrhage or mass effect. Electronically Signed   By: Mitzi Hansen M.D.   On: 07/17/2017 18:16    EKG: Independently reviewed.  Normal sinus rhythm.  T wave inversion in anterior lateral leads. No STEMI.  Assessment/Plan Active Problems:   Stroke [facility])   Stroke CT head negative for acute bleed, showed stroke Right lateral hemianopsia of R eye MRI ordered and  showed stroke Aspirin full dose given  And daily A1c ordered Lipid panel ordered Admitted to telemetry bed Echo ordered Carotid Dopplers ordered MRA ordered showed P2 narrowing bil Stroke team to follow patient Started statin EDP consulted neurology who will see patient in the morning  Low K Given in ED Will recheck in AM  UA abn Dirty catch pt w/o symptoms, no further abx  Hypertension When necessary hydralazine 10 mg IV as needed for severe blood pressure Cont Prinzide  L ankle gout attack Colchcine given Hold steroids  Tobacco dependence Nicotine patch prn  Plantar fascitis F/u as OP  Depression No SI/HI Will monitor    Code Status: FC  DVT Prophylaxis: heparin Family Communication: MOP at bedside Disposition Plan: Pending Improvement  Status: tele inpt  Suzanne Salter, MD Family Medicine [facility] [email or web address] Password [email or web address]

## 2017-07-17 NOTE — ED Provider Notes (Signed)
[facility]-EMERGENCY DEPT Provider Note   CSN: [id number] Arrival date & time: 07/17/17  1102     History   Chief Complaint Chief Complaint  Patient presents with  . Blurred Vision    HPI Suzanne Graham is a 37 y.o. female.  HPI   Reports blurred vision, began 3 days ago, initially it was intermittent and associated with headaches. Initially only had it with headache and it would go away when headache went away. No hx of known migraines. Now for 2 days has been noticing constant feeling of darkness in peripheral vision of the right eye.  Aching left sided headache. Comes and goes. Now having vision change even without headache.  Nothing makes headache or vision worse.  No eye pain.  No eye drainage.  No numbness or weakness one one side other. Has gout acting up in foot.  No trauma.  No family or personal hx of MS.   Smokes, hx of htn has been off of medications Also has had recent URI symptoms which are improving Had been seen for combination of symptoms above 3 days ago, was asymptomatic prior to discharge and had resolution of visual changes with resolution of headache and htn    Past Medical History:  Diagnosis Date  . Asthma   . Depression   . Gout   . Hypertension     There are no active problems to display for this patient.   History reviewed. No pertinent surgical history.  OB History    No data available       Home Medications    Prior to Admission medications   Medication Sig Start Date End Date Taking? Authorizing Provider  albuterol (PROVENTIL HFA;VENTOLIN HFA) 108 (90 Base) MCG/ACT inhaler Inhale 2 puffs into the lungs every 4 (four) hours as needed for wheezing or shortness of breath.   Yes [provider]  ibuprofen (ADVIL,MOTRIN) 200 MG tablet Take 400 mg by mouth 3 (three) times daily as needed.   Yes [provider]  lisinopril-hydrochlorothiazide (ZESTORETIC) 20-12.5 MG tablet Take 1 tablet by mouth  daily. 07/14/17 08/13/17 Yes Mathews Robinsons B, PA-C  meloxicam (MOBIC) 15 MG tablet Take 7.5 mg by mouth daily as needed for pain.   Yes [provider]  predniSONE (DELTASONE) 10 MG tablet Take 4 tablets (40 mg total) by mouth daily for 4 days. 07/14/17 07/18/17 Yes Georgiana Shore, PA-C  aspirin-acetaminophen-caffeine (EXCEDRIN MIGRAINE) [phone] MG tablet Take 1 tablet by mouth every 6 (six) hours as needed for headache or migraine. 07/14/17   Georgiana Shore, PA-C    Family History Family History  Problem Relation Age of Onset  . Heart failure Mother   . Hypertension Mother     Social History Social History   Tobacco Use  . Smoking status: Current Every Day Smoker    Packs/day: 0.50    Types: Cigarettes  . Smokeless tobacco: Never Used  Substance Use Topics  . Alcohol use: Yes    Comment: occasinally  . Drug use: No     Allergies   Patient has no known allergies.   Review of Systems Review of Systems  Constitutional: Negative for fever.  HENT: Positive for congestion (4 days).   Eyes: Positive for visual disturbance. Negative for photophobia, pain and discharge.  Respiratory: Positive for cough. Negative for shortness of breath.   Cardiovascular: Negative for chest pain.  Gastrointestinal: Negative for abdominal pain, nausea and vomiting.  Genitourinary: Negative for dysuria.  Musculoskeletal: Positive  for arthralgias.  Neurological: Positive for headaches. Negative for dizziness, speech difficulty, weakness and numbness.     Physical Exam Updated Vital Signs BP (!) 163/110 (BP Location: Left Arm)   Pulse 75   Temp 98.8 F (37.1 C) (Oral)   Resp 15   LMP 07/14/2017   SpO2 100%   Physical Exam  Constitutional: She is oriented to person, place, and time. She appears well-developed and well-nourished. No distress.  HENT:  Head: Normocephalic and atraumatic.  Eyes: Conjunctivae and EOM are normal.  Neck: Normal range of motion.    Cardiovascular: Normal rate, regular rhythm, normal heart sounds and intact distal pulses. Exam reveals no gallop and no friction rub.  No murmur heard. Pulmonary/Chest: Effort normal and breath sounds normal. No respiratory distress. She has no wheezes. She has no rales.  Abdominal: Soft. She exhibits no distension. There is no tenderness. There is no guarding.  Musculoskeletal: She exhibits no edema or tenderness.  Neurological: She is alert and oriented to person, place, and time. She has normal strength. No cranial nerve deficit or sensory deficit. Coordination and gait (walking with crutch now due to gout pain, difficult to assess) normal. GCS eye subscore is 4. GCS verbal subscore is 5. GCS motor subscore is 6.  Sensation of right sided visual field deficit in right eye, when tested initially reports she can see fingers, then says she cannot  Skin: Skin is warm and dry. No rash noted. She is not diaphoretic. No erythema.  Nursing note and vitals reviewed.    ED Treatments / Results  Labs (all labs ordered are listed, but only abnormal results are displayed) Labs Reviewed  I-STAT CHEM 8, ED - Abnormal; Notable for the following components:      Result Value   Potassium 3.2 (*)    Chloride 100 (*)    All other components within normal limits  ETHANOL  PROTIME-INR  APTT  CBC  DIFFERENTIAL  COMPREHENSIVE METABOLIC PANEL  RAPID URINE DRUG SCREEN, HOSP PERFORMED  URINALYSIS, ROUTINE W REFLEX MICROSCOPIC  I-STAT TROPONIN, ED    EKG  EKG Interpretation  Date/Time:  Thursday July 17 2017 16:33:54 EST Ventricular Rate:  71 PR Interval:    QRS Duration: 91 QT Interval:  400 QTC Calculation: 435 R Axis:   75 Text Interpretation:  Sinus rhythm Probable LVH with secondary repol abnrm No previous ECGs available Confirmed by Alvira MondaySchlossman, Mollyann Halbert ([id number]) on 07/17/2017 5:14:49 PM       Radiology Ct Head Wo Contrast  Result Date: 07/17/2017 CLINICAL DATA:  Right eye blurred  vision. EXAM: CT HEAD WITHOUT CONTRAST TECHNIQUE: Contiguous axial images were obtained from the base of the skull through the vertex without intravenous contrast. COMPARISON:  None. FINDINGS: Brain: Low-density noted in the left occipital lobe compatible with infarction. No hemorrhage or hydrocephalus. No mass lesion. Vascular: No hyperdense vessel or unexpected calcification. Skull: No acute calvarial abnormality. Sinuses/Orbits: No acute finding. Other: None IMPRESSION: Low-density in the left occipital lobe compatible with acute to subacute infarct. Electronically Signed   By: Charlett NoseKevin  Dover M.D.   On: 07/17/2017 15:49    Procedures Procedures (including critical care time)  Medications Ordered in ED Medications  prochlorperazine (COMPAZINE) tablet 10 mg (10 mg Oral Given 07/17/17 1652)  LORazepam (ATIVAN) injection 1 mg (1 mg Intravenous Given 07/17/17 1703)     Initial Impression / Assessment and Plan / ED Course  I have reviewed the triage vital signs and the nursing notes.  Pertinent labs & imaging  results that were available during my care of the patient were reviewed by me and considered in my medical decision making (see chart for details).     37 year old female with a history of asthma, hypertension, recently not on medications, who presents with concern for right sided eye blurriness and visual field changes.  Patient had been seen 3 days ago, with multiple concerns at that time, which included right eye blurriness that would come and go in association with her headache, and had resolved prior to discharge.  She reports for the last 2 days, and now the symptoms has been constant, and this time notes more of a right visual fields problem, and constant symptoms at this time that are not associated with her headache.  CT head was done which shows signs of acute or subacute CVA.  Discussed with Dr. Amada Jupiter of neurology.  Will plan to admit to [facility] for further stroke care.   Ordered MRI, CT Angio of head and neck for further evaluation.  Signed out to Dr. Erma Heritage with labwork and hospitalist consult for admission pending. Discussed plan with patient in detail.   Final Clinical Impressions(s) / ED Diagnoses   Final diagnoses:  Loss of peripheral visual field, right  Occipital stroke [facility])    ED Discharge Orders    None       Alvira Monday, MD 07/17/17 [phone]

## 2017-07-17 NOTE — ED Notes (Signed)
Patient c/o of blurred vision. Patient explains that everything is blurry even up close.

## 2017-07-17 NOTE — ED Provider Notes (Signed)
Assumed care from Dr. Dalene SeltzerSchlossman at 4:45 PM. Briefly, the patient is a 37 y.o. female with PMHx of  has a past medical history of Asthma, Depression, Gout, and Hypertension. here with new occipital stroke. Right sided peripheral vision impaired. Had HA x 2 days. CT scan is c/f subacute infarct.   Labs Reviewed  CBC - Abnormal; Notable for the following components:      Result Value   MCV 75.8 (*)    MCH 24.2 (*)    All other components within normal limits  COMPREHENSIVE METABOLIC PANEL - Abnormal; Notable for the following components:   Potassium 3.2 (*)    Total Protein 8.2 (*)    All other components within normal limits  URINALYSIS, ROUTINE W REFLEX MICROSCOPIC - Abnormal; Notable for the following components:   APPearance CLOUDY (*)    Hgb urine dipstick SMALL (*)    Nitrite POSITIVE (*)    Leukocytes, UA LARGE (*)    Bacteria, UA RARE (*)    Squamous Epithelial / LPF 6-30 (*)    All other components within normal limits  I-STAT CHEM 8, ED - Abnormal; Notable for the following components:   Potassium 3.2 (*)    Chloride 100 (*)    All other components within normal limits  ETHANOL  PROTIME-INR  APTT  DIFFERENTIAL  RAPID URINE DRUG SCREEN, HOSP PERFORMED  I-STAT TROPONIN, ED    Course of Care: -Lab work shows mild hypokalemia which has been replaced.  Patient also with possible UTI and will treat as such.  Will plan [facility] hospitalist [facility].     Suzanne Graham, Suzanne Trem[place], MD 07/17/17 [phone]

## 2017-07-17 NOTE — ED Notes (Signed)
EKG given to Schlossman,MD.

## 2017-07-17 NOTE — ED Notes (Signed)
Patient calls out for headache pain. Suzanne PicklesMade Schlossman, MD aware. Verbal compazine put in managed orders. Alerted pharmacy to verify and send medication.

## 2017-07-17 NOTE — ED Triage Notes (Signed)
Pt verbalizes continued right eye blurriness since seen three days ago; "they gave me something for my blood pressure but did nothing for my eye." Pt denies change in vision since last seen "it just isn't getting better."

## 2017-07-17 NOTE — ED Notes (Signed)
Patient in MRI 

## 2017-07-17 NOTE — ED Notes (Signed)
Waiting for pharmacy to send COMPAZINE.

## 2017-07-17 NOTE — ED Notes (Signed)
ED TO INPATIENT HANDOFF REPORT  Name/Age/Gender Suzanne Graham 37 y.o. female  Code Status    Code Status Orders  (From admission, onward)        Start     Ordered   07/17/17 2134  Full code  Continuous     07/17/17 2135    Code Status History    Date Active Date Inactive Code Status Order ID Comments User Context   This patient has a current code status but no historical code status.      Home/SNF/Other Home  Chief Complaint vision trouble  Level of Care/Admitting Diagnosis ED Disposition    ED Disposition Condition [place]: [place] [[id number]]  Level of Care: Telemetry [5]  Diagnosis: Stroke [facility]) [[id number]]  Admitting Physician: Elwin Mocha [[id number]]  Attending Physician: Elwin Mocha [[id number]]  Estimated length of stay: 3 - 4 days  Certification:: I certify this patient will need inpatient services for at least 2 midnights  PT Class (Do Not Modify): Inpatient [101]  PT Acc Code (Do Not Modify): Private [1]       Medical History Past Medical History:  Diagnosis Date  . Asthma   . Depression   . Gout   . Hypertension     Allergies No Known Allergies  IV Location/Drains/Wounds Patient Lines/Drains/Airways Status   Active Line/Drains/Airways    Name:   Placement date:   Placement time:   Site:   Days:   Peripheral IV 07/17/17 Left Antecubital   07/17/17    1941    Antecubital   less than 1          Labs/Imaging Results for orders placed or performed during the hospital encounter of 07/17/17 (from the past 48 hour(s))  Urine rapid drug screen (hosp performed)     Status: None   Collection Time: 07/17/17  4:29 PM  Result Value Ref Range   Opiates NONE DETECTED NONE DETECTED   Cocaine NONE DETECTED NONE DETECTED   Benzodiazepines NONE DETECTED NONE DETECTED   Amphetamines NONE DETECTED NONE DETECTED   Tetrahydrocannabinol NONE DETECTED NONE DETECTED   Barbiturates NONE DETECTED NONE DETECTED     Comment: (NOTE) DRUG SCREEN FOR MEDICAL PURPOSES ONLY.  IF CONFIRMATION IS NEEDED FOR ANY PURPOSE, NOTIFY LAB WITHIN 5 DAYS. LOWEST DETECTABLE LIMITS FOR URINE DRUG SCREEN Drug Class                     Cutoff (ng/mL) Amphetamine and metabolites    1000 Barbiturate and metabolites    200 Benzodiazepine                 937 Tricyclics and metabolites     300 Opiates and metabolites        300 Cocaine and metabolites        300 THC                            50   Urinalysis, Routine w reflex microscopic     Status: Abnormal   Collection Time: 07/17/17  4:29 PM  Result Value Ref Range   Color, Urine YELLOW YELLOW   APPearance CLOUDY (A) CLEAR   Specific Gravity, Urine 1.014 1.005 - 1.030   pH 5.0 5.0 - 8.0   Glucose, UA NEGATIVE NEGATIVE mg/dL   Hgb urine dipstick SMALL (A) NEGATIVE   Bilirubin Urine NEGATIVE NEGATIVE   Ketones, ur NEGATIVE  NEGATIVE mg/dL   Protein, ur NEGATIVE NEGATIVE mg/dL   Nitrite POSITIVE (A) NEGATIVE   Leukocytes, UA LARGE (A) NEGATIVE   RBC / HPF 6-30 0 - 5 RBC/hpf   WBC, UA 6-30 0 - 5 WBC/hpf   Bacteria, UA RARE (A) NONE SEEN   Squamous Epithelial / LPF 6-30 (A) NONE SEEN   Mucus PRESENT    Uric Acid Crys, UA PRESENT   Ethanol     Status: None   Collection Time: 07/17/17  4:38 PM  Result Value Ref Range   Alcohol, Ethyl (B) <10 <10 mg/dL    Comment:        LOWEST DETECTABLE LIMIT FOR SERUM ALCOHOL IS 10 mg/dL FOR MEDICAL PURPOSES ONLY   Protime-INR     Status: None   Collection Time: 07/17/17  4:38 PM  Result Value Ref Range   Prothrombin Time 13.8 11.4 - 15.2 seconds   INR 1.07   APTT     Status: None   Collection Time: 07/17/17  4:38 PM  Result Value Ref Range   aPTT 32 24 - 36 seconds  CBC     Status: Abnormal   Collection Time: 07/17/17  4:38 PM  Result Value Ref Range   WBC 7.3 4.0 - 10.5 K/uL   RBC 5.05 3.87 - 5.11 MIL/uL   Hemoglobin 12.2 12.0 - 15.0 g/dL   HCT 38.3 36.0 - 46.0 %   MCV 75.8 (L) 78.0 - 100.0 fL   MCH 24.2 (L)  26.0 - 34.0 pg   MCHC 31.9 30.0 - 36.0 g/dL   RDW 14.0 11.5 - 15.5 %   Platelets 240 150 - 400 K/uL  Differential     Status: None   Collection Time: 07/17/17  4:38 PM  Result Value Ref Range   Neutrophils Relative % 60 %   Lymphocytes Relative 27 %   Monocytes Relative 10 %   Eosinophils Relative 3 %   Basophils Relative 0 %   Neutro Abs 4.4 1.7 - 7.7 K/uL   Lymphs Abs 2.0 0.7 - 4.0 K/uL   Monocytes Absolute 0.7 0.1 - 1.0 K/uL   Eosinophils Absolute 0.2 0.0 - 0.7 K/uL   Basophils Absolute 0.0 0.0 - 0.1 K/uL   Smear Review MORPHOLOGY UNREMARKABLE     Comment: PLATELET COUNT CONFIRMED BY SMEAR  Comprehensive metabolic panel     Status: Abnormal   Collection Time: 07/17/17  4:38 PM  Result Value Ref Range   Sodium 138 135 - 145 mmol/L   Potassium 3.2 (L) 3.5 - 5.1 mmol/L   Chloride 102 101 - 111 mmol/L   CO2 28 22 - 32 mmol/L   Glucose, Bld 92 65 - 99 mg/dL   BUN 11 6 - 20 mg/dL   Creatinine, Ser 0.99 0.44 - 1.00 mg/dL   Calcium 9.8 8.9 - 10.3 mg/dL   Total Protein 8.2 (H) 6.5 - 8.1 g/dL   Albumin 4.0 3.5 - 5.0 g/dL   AST 18 15 - 41 U/L   ALT 16 14 - 54 U/L   Alkaline Phosphatase 41 38 - 126 U/L   Total Bilirubin 0.8 0.3 - 1.2 mg/dL   GFR calc non Af Amer >60 >60 mL/min   GFR calc Af Amer >60 >60 mL/min    Comment: (NOTE) The eGFR has been calculated using the CKD EPI equation. This calculation has not been validated in all clinical situations. eGFR's persistently <60 mL/min signify possible Chronic Kidney Disease.    Anion gap 8  5 - 15  I-stat troponin, ED     Status: None   Collection Time: 07/17/17  4:45 PM  Result Value Ref Range   Troponin i, poc 0.01 0.00 - 0.08 ng/mL   Comment 3            Comment: Due to the release kinetics of cTnI, a negative result within the first hours of the onset of symptoms does not rule out myocardial infarction with certainty. If myocardial infarction is still suspected, repeat the test at appropriate intervals.   I-Stat Chem  8, ED     Status: Abnormal   Collection Time: 07/17/17  4:48 PM  Result Value Ref Range   Sodium 141 135 - 145 mmol/L   Potassium 3.2 (L) 3.5 - 5.1 mmol/L   Chloride 100 (L) 101 - 111 mmol/L   BUN 8 6 - 20 mg/dL   Creatinine, Ser 0.90 0.44 - 1.00 mg/dL   Glucose, Bld 92 65 - 99 mg/dL   Calcium, Ion 1.22 1.15 - 1.40 mmol/L   TCO2 27 22 - 32 mmol/L   Hemoglobin 14.3 12.0 - 15.0 g/dL   HCT 42.0 36.0 - 46.0 %  CBC     Status: Abnormal   Collection Time: 07/17/17  9:34 PM  Result Value Ref Range   WBC 6.8 4.0 - 10.5 K/uL   RBC 4.89 3.87 - 5.11 MIL/uL   Hemoglobin 11.8 (L) 12.0 - 15.0 g/dL   HCT 37.1 36.0 - 46.0 %   MCV 75.9 (L) 78.0 - 100.0 fL   MCH 24.1 (L) 26.0 - 34.0 pg   MCHC 31.8 30.0 - 36.0 g/dL   RDW 13.9 11.5 - 15.5 %   Platelets 236 150 - 400 K/uL  Creatinine, serum     Status: None   Collection Time: 07/17/17  9:34 PM  Result Value Ref Range   Creatinine, Ser 0.85 0.44 - 1.00 mg/dL   GFR calc non Af Amer >60 >60 mL/min   GFR calc Af Amer >60 >60 mL/min    Comment: (NOTE) The eGFR has been calculated using the CKD EPI equation. This calculation has not been validated in all clinical situations. eGFR's persistently <60 mL/min signify possible Chronic Kidney Disease.    Ct Angio Head W Or Wo Contrast  Result Date: 07/17/2017 CLINICAL DATA:  37 y/o  F; vision changes. EXAM: CT ANGIOGRAPHY HEAD AND NECK TECHNIQUE: Multidetector CT imaging of the head and neck was performed using the standard protocol during bolus administration of intravenous contrast. Multiplanar CT image reconstructions and MIPs were obtained to evaluate the vascular anatomy. Carotid stenosis measurements (when applicable) are obtained utilizing NASCET criteria, using the distal internal carotid diameter as the denominator. CONTRAST:  176m ISOVUE-370 IOPAMIDOL (ISOVUE-370) INJECTION 76% COMPARISON:  Same-day MRI of the brain. FINDINGS: CTA NECK FINDINGS Aortic arch: Bovine variant branching. Imaged portion  shows no evidence of aneurysm or dissection. No significant stenosis of the major arch vessel origins. Mild calcific atherosclerosis. Right carotid system: No evidence of dissection, stenosis (50% or greater) or occlusion. Left carotid system: No evidence of dissection, stenosis (50% or greater) or occlusion. Vertebral arteries: Left dominant. No evidence of dissection, stenosis (50% or greater) or occlusion. Skeleton: Negative. Other neck: Negative. Upper chest: 13 mm left cheek dermal cyst. Review of the MIP images confirms the above findings CTA HEAD FINDINGS Anterior circulation: No significant stenosis, proximal occlusion, aneurysm, or vascular malformation. Posterior circulation: Bilateral P2 short segments of occlusion/near occlusion with distal reconstitution (series 12,  image 23 and 17 as well as series 8, image 103). Otherwise no large vessel occlusion, aneurysm, or significant stenosis is identified. Venous sinuses: As permitted by contrast timing, patent. Anatomic variants: Anterior and bilateral posterior communicating arteries are present. Delayed phase: No abnormal intracranial enhancement. Review of the MIP images confirms the above findings IMPRESSION: 1. Negative CTA of the neck. 2. Bilateral P2 short segments of occlusion/near occlusion with distal reconstitution. Given patient's age and symmetry consideration for vasculitis, reversible cerebral vasoconstriction syndrome, or vasospasm should be considered in addition to thromboembolic disease. 3. Otherwise negative CTA of the head. These results were called by telephone at the time of interpretation on 07/17/2017 at 6:19 pm to Dr. Ellender Hose , who verbally acknowledged these results. Electronically Signed   By: Kristine Garbe M.D.   On: 07/17/2017 18:31   Ct Head Wo Contrast  Result Date: 07/17/2017 CLINICAL DATA:  Right eye blurred vision. EXAM: CT HEAD WITHOUT CONTRAST TECHNIQUE: Contiguous axial images were obtained from the base of  the skull through the vertex without intravenous contrast. COMPARISON:  None. FINDINGS: Brain: Low-density noted in the left occipital lobe compatible with infarction. No hemorrhage or hydrocephalus. No mass lesion. Vascular: No hyperdense vessel or unexpected calcification. Skull: No acute calvarial abnormality. Sinuses/Orbits: No acute finding. Other: None IMPRESSION: Low-density in the left occipital lobe compatible with acute to subacute infarct. Electronically Signed   By: Rolm Baptise M.D.   On: 07/17/2017 15:49   Ct Angio Neck W Or Wo Contrast  Result Date: 07/17/2017 CLINICAL DATA:  37 y/o  F; vision changes. EXAM: CT ANGIOGRAPHY HEAD AND NECK TECHNIQUE: Multidetector CT imaging of the head and neck was performed using the standard protocol during bolus administration of intravenous contrast. Multiplanar CT image reconstructions and MIPs were obtained to evaluate the vascular anatomy. Carotid stenosis measurements (when applicable) are obtained utilizing NASCET criteria, using the distal internal carotid diameter as the denominator. CONTRAST:  144m ISOVUE-370 IOPAMIDOL (ISOVUE-370) INJECTION 76% COMPARISON:  Same-day MRI of the brain. FINDINGS: CTA NECK FINDINGS Aortic arch: Bovine variant branching. Imaged portion shows no evidence of aneurysm or dissection. No significant stenosis of the major arch vessel origins. Mild calcific atherosclerosis. Right carotid system: No evidence of dissection, stenosis (50% or greater) or occlusion. Left carotid system: No evidence of dissection, stenosis (50% or greater) or occlusion. Vertebral arteries: Left dominant. No evidence of dissection, stenosis (50% or greater) or occlusion. Skeleton: Negative. Other neck: Negative. Upper chest: 13 mm left cheek dermal cyst. Review of the MIP images confirms the above findings CTA HEAD FINDINGS Anterior circulation: No significant stenosis, proximal occlusion, aneurysm, or vascular malformation. Posterior circulation:  Bilateral P2 short segments of occlusion/near occlusion with distal reconstitution (series 12, image 23 and 17 as well as series 8, image 103). Otherwise no large vessel occlusion, aneurysm, or significant stenosis is identified. Venous sinuses: As permitted by contrast timing, patent. Anatomic variants: Anterior and bilateral posterior communicating arteries are present. Delayed phase: No abnormal intracranial enhancement. Review of the MIP images confirms the above findings IMPRESSION: 1. Negative CTA of the neck. 2. Bilateral P2 short segments of occlusion/near occlusion with distal reconstitution. Given patient's age and symmetry consideration for vasculitis, reversible cerebral vasoconstriction syndrome, or vasospasm should be considered in addition to thromboembolic disease. 3. Otherwise negative CTA of the head. These results were called by telephone at the time of interpretation on 07/17/2017 at 6:19 pm to Dr. IEllender Hose, who verbally acknowledged these results. Electronically Signed   By: LMia Creek  Furusawa-Stratton M.D.   On: 07/17/2017 18:31   Mr Brain Wo Contrast  Result Date: 07/17/2017 CLINICAL DATA:  37 y/o F; right-sided eye blurriness and visual field changes. EXAM: MRI HEAD WITHOUT CONTRAST TECHNIQUE: Multiplanar, multiecho pulse sequences of the brain and surrounding structures were obtained without intravenous contrast. COMPARISON:  07/17/2016 CT head.  CT angiogram of head. FINDINGS: Brain: Multiple foci of reduced diffusion are present throughout the left PCA distribution involving left medial temporal lobe, occipital lobe, left splenium of corpus callosum, and left thalamus compatible with acute/early subacute infarction. Areas of infarction demonstrate T2 FLAIR hyperintense signal abnormality. No significant mass effect or hemorrhage. Fewnonspecific foci of T2 FLAIR hyperintense signal abnormality in subcortical and periventricular white matter are compatible withmildchronic microvascular  ischemic changes for age. No extra-axial collection, hydrocephalus, or effacement of basilar cisterns. No herniation. Vascular: Refer to same-day CT angiogram of head. Skull and upper cervical spine: Normal marrow signal. Sinuses/Orbits: Moderate maxillary and anterior ethmoid sinus mucosal thickening. No abnormal signal of mastoid air cells. Orbits are unremarkable. Other: None. IMPRESSION: Several areas of acute/early subacute infarction within the left posterior cerebral artery distribution. No hemorrhage or mass effect. Electronically Signed   By: Kristine Garbe M.D.   On: 07/17/2017 18:16    Pending Labs Unresulted Labs (From admission, onward)   Start     Ordered   07/18/17 0500  Hemoglobin A1c  Tomorrow morning,   R     07/17/17 2135   07/18/17 0500  Lipid panel  Tomorrow morning,   R    Comments:  Fasting    07/17/17 2135   07/17/17 2134  HIV antibody (Routine Testing)  Once,   R     07/17/17 2135   07/17/17 1745  Urine culture  STAT,   STAT    Question:  Patient immune status  Answer:  Normal   07/17/17 1744      Vitals/Pain Today's Vitals   07/17/17 1705 07/17/17 1824 07/17/17 1825 07/17/17 2131  BP: (!) 163/110 (!) 177/105  (!) 156/90  Pulse: 75 81 81 83  Resp: 15 (!) 21 (!) 21 (!) 21  Temp:      TempSrc:      SpO2: 100% 100% 98% 99%    Isolation Precautions No active isolations  Medications Medications  pravastatin (PRAVACHOL) tablet 80 mg (not administered)  nicotine (NICODERM CQ - dosed in mg/24 hours) patch 14 mg (not administered)  colchicine tablet 1.2 mg (not administered)  hydrALAZINE (APRESOLINE) injection 10 mg (not administered)  aspirin tablet 325 mg (not administered)  lisinopril-hydrochlorothiazide (PRINZIDE,ZESTORETIC) 20-12.5 MG per tablet 1 tablet (not administered)   stroke: mapping our early stages of recovery book (not administered)  0.9 %  sodium chloride infusion (not administered)  acetaminophen (TYLENOL) tablet 650 mg (not  administered)    Or  acetaminophen (TYLENOL) solution 650 mg (not administered)    Or  acetaminophen (TYLENOL) suppository 650 mg (not administered)  senna-docusate (Senokot-S) tablet 1 tablet (not administered)  heparin injection 5,000 Units (not administered)  prochlorperazine (COMPAZINE) tablet 10 mg (10 mg Oral Given 07/17/17 1652)  LORazepam (ATIVAN) injection 1 mg (1 mg Intravenous Given 07/17/17 1703)  iopamidol (ISOVUE-370) 76 % injection (100 mLs Intravenous Contrast Given 07/17/17 1758)  potassium chloride SA (K-DUR,KLOR-CON) CR tablet 40 mEq (40 mEq Oral Given 07/17/17 1817)  cefTRIAXone (ROCEPHIN) 1 g in dextrose 5 % 50 mL IVPB (0 g Intravenous Stopped 07/17/17 1851)  aspirin chewable tablet 324 mg (324 mg Oral Given 07/17/17 1919)  Mobility walks  

## 2017-07-17 NOTE — ED Notes (Signed)
Stroke swallow screen done at 1707.

## 2017-07-17 NOTE — ED Notes (Signed)
Called pharmacy to check on compazine medication.

## 2017-07-18 ENCOUNTER — Inpatient Hospital Stay (HOSPITAL_COMMUNITY): Payer: Self-pay

## 2017-07-18 ENCOUNTER — Other Ambulatory Visit: Payer: Self-pay

## 2017-07-18 DIAGNOSIS — I63432 Cerebral infarction due to embolism of left posterior cerebral artery: Secondary | ICD-10-CM

## 2017-07-18 DIAGNOSIS — Z720 Tobacco use: Secondary | ICD-10-CM

## 2017-07-18 DIAGNOSIS — I639 Cerebral infarction, unspecified: Secondary | ICD-10-CM

## 2017-07-18 LAB — BASIC METABOLIC PANEL
ANION GAP: 10 (ref 5–15)
BUN: 7 mg/dL (ref 6–20)
CALCIUM: 9.8 mg/dL (ref 8.9–10.3)
CO2: 26 mmol/L (ref 22–32)
Chloride: 101 mmol/L (ref 101–111)
Creatinine, Ser: 0.97 mg/dL (ref 0.44–1.00)
GFR calc Af Amer: >60 mL/min (ref >60)
GLUCOSE: 101 mg/dL — AB (ref 65–99)
POTASSIUM: 3.6 mmol/L (ref 3.5–5.1)
Sodium: 137 mmol/L (ref 135–145)

## 2017-07-18 LAB — ECHOCARDIOGRAM COMPLETE
HEIGHTINCHES: 64 in
Weight: 3714.31 oz

## 2017-07-18 LAB — LIPID PANEL
CHOLESTEROL: 162 mg/dL (ref 0–200)
HDL: 48 mg/dL (ref >40)
LDL CALC: 98 mg/dL (ref 0–99)
Total CHOL/HDL Ratio: 3.4 RATIO
Triglycerides: 80 mg/dL (ref <150)
VLDL: 16 mg/dL (ref 0–40)

## 2017-07-18 LAB — HIV ANTIBODY (ROUTINE TESTING W REFLEX): HIV Screen 4th Generation wRfx: NONREACTIVE

## 2017-07-18 LAB — HEMOGLOBIN A1C
HEMOGLOBIN A1C: 5.1 % (ref 4.8–5.6)
Mean Plasma Glucose: 99.67 mg/dL

## 2017-07-18 LAB — SEDIMENTATION RATE: Sed Rate: 47 mm/hr — ABNORMAL HIGH (ref 0–22)

## 2017-07-18 LAB — ANTITHROMBIN III: ANTITHROMB III FUNC: 93 % (ref 75–120)

## 2017-07-18 MED ORDER — ATORVASTATIN CALCIUM 20 MG PO TABS: 20.0000 mg | ORAL_TABLET | Freq: Every day | ORAL | 0 refills | Status: DC

## 2017-07-18 MED ORDER — ATORVASTATIN CALCIUM 10 MG PO TABS: 20.0000 mg | ORAL_TABLET | Freq: Every day | ORAL | Status: DC

## 2017-07-18 MED ORDER — NICOTINE 14 MG/24HR TD PT24: 14.0000 mg | MEDICATED_PATCH | Freq: Every day | TRANSDERMAL | 0 refills | Status: DC | PRN

## 2017-07-18 MED ORDER — ALBUTEROL SULFATE (2.5 MG/3ML) 0.083% IN NEBU: 2.5000 mg | INHALATION_SOLUTION | RESPIRATORY_TRACT | Status: DC | PRN

## 2017-07-18 MED ORDER — ASPIRIN 325 MG PO TABS: 325.0000 mg | ORAL_TABLET | Freq: Every day | ORAL | 0 refills | Status: DC

## 2017-07-18 MED ORDER — COLCHICINE 0.6 MG PO TABS
0.6000 mg | ORAL_TABLET | Freq: Once | ORAL | Status: AC
  Administered 2017-07-18: 0.6 mg via ORAL
  Filled 2017-07-18: qty 1

## 2017-07-18 NOTE — Progress Notes (Signed)
Gave patient AVS information to sign...Marland Kitchen.answered all discharge information.  Patient has no questions at this time.  Awaiting ride.

## 2017-07-18 NOTE — Progress Notes (Signed)
*  PRELIMINARY RESULTS* Vascular Ultrasound Transcranial Doppler with Bubbles has been completed with Dr. Pearlean BrownieSethi. There is no evidence of high intensity transient signals (HITS) at rest or with Valsalva maneuver. Therefore, there is no evidence of patent foramen ovale (PFO).  Bilateral lower extremity venous duplex completed. Bilateral lower extremities are negative for deep vein thrombosis. There is no evidence of Baker's cyst bilaterally.  07/18/2017 4:08 PM Gertie FeyMichelle Koichi Platte, BS, RVT, RDCS, RDMS

## 2017-07-18 NOTE — Care Management Note (Signed)
Case Management Note  Patient Details  Name: Suzanne Graham MRN: [id number] Date of Birth: 11/02/1980  Subjective/Objective:  Pt admitted with CVA. She is from home with her mother. Pt has no PCP listed or insurance.                   Action/Plan: Pt states [facility] and has an appointment Feb 1st. CM will follow up with the clinics on Monday since they are currently closed.  [facility]  Pt has 2 prescriptions at d/c. CM cant assist with the nicotine patch. CM provided her with Good [employer] card to assist with the cost of the lipitor. Pt feels she can afford the medication with the discount card.  No DME needs.  Pt states her mother will provide transportation home.   Expected Discharge Date:  07/18/17               Expected Discharge Plan:  Home w Home Health Services  In-House Referral:     Discharge planning Services  CM Consult  Post Acute Care Choice:  Home Health Choice offered to:  Patient  DME Arranged:    DME Agency:     HH Arranged:  PT, OT, Speech Therapy HH Agency:  [facility](charity)  Status of Service:  Completed, signed off  If discussed at Long Length of Stay Meetings, dates discussed:    Additional Comments:  Kermit BaloKelli F Biruk Troia, RN 07/18/2017, 4:49 PM

## 2017-07-18 NOTE — Evaluation (Signed)
Occupational Therapy Evaluation Patient Details Name: Suzanne Graham MRN: [id number] DOB: 07/20/1980 Today's Date: 07/18/2017    History of Present Illness 37 y.o. female past history of asthma, depression and anxiety, gout and hypertension, along with chronic headaches, who presented to the emergency room for evaluation of headache and blurry vision. MRI of the brain was done that showed several areas of acute/early subacute infarct within the left PCA distribution.  She was admitted for further stroke workup.   Clinical Impression   Pt admitted with the above diagnoses and presents with below problem list. Pt will benefit from continued acute OT to address the below listed deficits and maximize independence with ADLs prior to d/c home. 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 Pt presents with visual changes impaciting ADLs. Pt also with 1 LOB ambulating in room when she encountered an obstacle (sink) on her right side, min A to correct balance. Overall pt is supervision to min guard with ADLs and functional transfers. Discussed visual compensation strategies for R hemianopsia to maximize safety in the home.      Follow Up Recommendations  Home health OT   Equipment Recommendations  None recommended by OT    Recommendations for Other Services PT consult     Precautions / Restrictions Restrictions Weight Bearing Restrictions: No      Mobility Bed Mobility Overal bed mobility: Modified Independent                Transfers Overall transfer level: Modified independent Equipment used: (1 crutch, pt reports using sometimes due to gout)             General transfer comment: supervision for safety    Balance Overall balance assessment: Needs assistance         Standing balance support: Single extremity supported;No upper extremity supported Standing balance-Leahy Scale: Fair Standing balance comment: fair  static, wide BOS and occasional external support sought during dynamic tasks.                            ADL either performed or assessed with clinical judgement   ADL Overall ADL's : Needs assistance/impaired Eating/Feeding: Set up;Sitting   Grooming: Set up;Sitting   Upper Body Bathing: Set up;Sitting   Lower Body Bathing: Min guard;Sit to/from stand   Upper Body Dressing : Set up;Sitting   Lower Body Dressing: Min guard;Sit to/from stand   Toilet Transfer: Min guard;Ambulation   Toileting- Clothing Manipulation and Hygiene: Set up;Sit to/from stand;Supervision/safety   Tub/ Shower Transfer: Tub transfer;Min guard;Ambulation   Functional mobility during ADLs: Min guard;Minimal assistance General ADL Comments: Pt with 1x LOB around obstacle (sink) on right side. Pt able to correct with min A from therapist. Otherwise min guard for safety.      Vision Baseline Vision/History: ("I'm suppose to wear glasses but I don't.") Patient Visual Report: Peripheral vision impairment;Other (comment)(right eye periphery) Vision Assessment?: Yes Eye Alignment: Within Functional Limits Ocular Range of Motion: Within Functional Limits Alignment/Gaze Preference: Within Defined Limits Visual Fields: Right homonymous hemianopsia     Perception     Praxis      Pertinent Vitals/Pain Pain Assessment: Faces Faces Pain Scale: Hurts even more Pain Location: headache Pain Descriptors / Indicators: Aching Pain Intervention(s): Monitored during session;Patient requesting pain meds-RN notified     Hand Dominance     Extremity/Trunk Assessment Upper Extremity Assessment Upper Extremity Assessment: Overall [facility]  for tasks assessed   Lower Extremity Assessment Lower Extremity Assessment: Defer to PT evaluation       Communication Communication Communication: No difficulties   Cognition Arousal/Alertness: Awake/alert Behavior During Therapy: WFL for tasks  assessed/performed Overall Cognitive Status: Within Functional Limits for tasks assessed                                     General Comments  Pt's mother present throughout session.     Exercises     Shoulder Instructions      Home Living Family/patient expects to be discharged to:: Private residence Living Arrangements: Parent Available Help at Discharge: Family Type of Home: House Home Access: Stairs to enter Secretary/administrator of Steps: 2   Home Layout: One level     Bathroom Shower/Tub: Tub/shower unit             Additional Comments: works at [employer] center, sorting and directing direction of items.      Prior Functioning/Environment Level of Independence: Independent                 OT Problem List: Impaired balance (sitting and/or standing);Impaired vision/perception;Decreased knowledge of use of DME or AE;Decreased knowledge of precautions      OT Treatment/Interventions: Self-care/ADL training;DME and/or AE instruction;Therapeutic activities;Visual/perceptual remediation/compensation;Balance training;Patient/family education    OT Goals(Current goals can be found in the care plan section) Acute Rehab OT Goals Patient Stated Goal: back to normal OT Goal Formulation: With patient Time For Goal Achievement: 07/25/17 Potential to Achieve Goals: Good ADL Goals Pt Will Perform Grooming: with modified independence;standing Pt Will Transfer to Toilet: with modified independence;ambulating Pt Will Perform Tub/Shower Transfer: Tub transfer;with modified independence;ambulating Additional ADL Goal #1: Pt will independently demonstrate 2 visual compensation strategies to improve safety with ADLs.  OT Frequency: Min 2X/week   Barriers to D/C:            Co-evaluation              AM-PAC PT "6 Clicks" Daily Activity     Outcome Measure Help from another person eating meals?: None Help from another person taking care of  personal grooming?: A Little Help from another person toileting, which includes using toliet, bedpan, or urinal?: A Little Help from another person bathing (including washing, rinsing, drying)?: A Little Help from another person to put on and taking off regular upper body clothing?: None Help from another person to put on and taking off regular lower body clothing?: A Little 6 Click Score: 20   End of Session Nurse Communication: Patient requests pain meds;Other (comment)(headache)  Activity Tolerance: Patient tolerated treatment well Patient left: in bed;with call bell/phone within reach;with nursing/sitter in room  OT Visit Diagnosis: Unsteadiness on feet (R26.81);Low vision, both eyes (H54.2)                Time: 1610-9604 OT Time Calculation (min): 22 min Charges:  OT General Charges $OT Visit: 1 Visit OT Evaluation $OT Eval Low Complexity: 1 Low G-Codes:       Pilar Grammes 07/18/2017, 1:23 PM

## 2017-07-18 NOTE — Evaluation (Signed)
Speech Language Pathology Evaluation Patient Details Name: Suzanne Graham MRN: [id number] DOB: 04/30/1981 Today's Date: 07/18/2017 Time: [id number] SLP Time Calculation (min) (ACUTE ONLY): 23 min  Problem List:  Patient Active Problem List   Diagnosis Date Noted  . Stroke [facility](HCC) 07/17/2017   Past Medical History:  Past Medical History:  Diagnosis Date  . Asthma   . Depression   . Gout   . Hypertension    Past Surgical History: History reviewed. No pertinent surgical history. HPI:  37 y.o. female past history of asthma, depression and anxiety, gout and hypertension, along with chronic headaches, who presented to the emergency room for evaluation of headache and blurry vision. MRI of the brain was done that showed several areas of acute/early subacute infarct within the left PCA distribution.  She was admitted for further stroke workup.   Assessment / Plan / Recommendation Clinical Impression   Pt presents with mild-moderate cognitive deficits characterized by decreased storage and retrieval of information, decreased recall of new information, decreased executive functioning, decreased safety awareness, decreased emergent awareness and decreased functional problem solving.  Pt scored 16/30 on the [employer] standardized cognitive assessment (N >/=26) and needed mod assist on evaluation tasks.  Pt's mother reports pt to be more forgetful and notes that pt "repeats herself" more often than at baseline.  Recommend HH ST follow up and 24/7 supervision at discharge.  ST will also follow up acutely for further ongoing diagnostic treatment of cognition.      SLP Assessment  SLP Recommendation/Assessment: Patient needs continued Speech Lanaguage Pathology Services SLP Visit Diagnosis: Cognitive communication deficit (R41.841)    Follow Up Recommendations  Home health SLP;24 hour supervision/assistance    Frequency and Duration min 1 x/week         SLP Evaluation Cognition  Overall Cognitive  Status: Impaired/Different from baseline Arousal/Alertness: Awake/alert Orientation Level: Oriented X4 Attention: Selective Selective Attention: Impaired Selective Attention Impairment: Verbal basic;Functional basic Memory: Impaired Memory Impairment: Retrieval deficit;Decreased recall of new information Awareness: Impaired Awareness Impairment: Emergent impairment Problem Solving: Impaired Problem Solving Impairment: Functional basic Executive Function: Organizing;Decision Making;Self Monitoring;Self Correcting Organizing: Impaired Organizing Impairment: Functional basic Decision Making: Impaired Decision Making Impairment: Functional basic;Verbal basic Self Monitoring: Impaired Self Monitoring Impairment: Functional basic Self Correcting: Impaired Self Correcting Impairment: Functional basic Behaviors: Impulsive Safety/Judgment: Impaired       Comprehension  Auditory Comprehension Overall Auditory Comprehension: Appears within functional limits for tasks assessed    Expression Expression Primary Mode of Expression: Verbal Verbal Expression Overall Verbal Expression: Appears within functional limits for tasks assessed   Oral / Motor  Oral Motor/Sensory Function Overall Oral Motor/Sensory Function: Within functional limits Motor Speech Overall Motor Speech: Appears within functional limits for tasks assessed   GO                    Maryjane HurterPage, Lil Lepage L 07/18/2017, 1:57 PM

## 2017-07-18 NOTE — Evaluation (Addendum)
Physical Therapy Evaluation Patient Details Name: Suzanne Graham MRN: [id number] DOB: [birth year] Today's Date: 07/18/2017   History of Present Illness  37 y.o. female past history of asthma, depression and anxiety, gout and hypertension, along with chronic headaches, who presented to the emergency room for evaluation of headache and blurry vision. MRI of the brain was done that showed several areas of acute/early subacute infarct within the left PCA distribution.  She was admitted for further stroke workup.  Clinical Impression  Pt admitted with above diagnosis. Pt currently with functional limitations due to the deficits listed below (see PT Problem List). 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 Pt is currently limited in her safe mobility by visual field deficits and decreased safety awareness. Pt is currently mod I for bed mobility, supervision for transfers and min guard for ambulation with single crutch to offload pressure on L foot secondary to gout flair up. Pt also min guard for ascent/descent of 3 steps with use of bilateral handrails. Pt educated in [facility] stroke symptomology and need to call 911 if she experiences any symptoms in the future. Pt requires vc for scanning Pt will benefit from skilled PT to increase their independence and safety with mobility to allow discharge to the venue listed below.       Follow Up Recommendations Home health PT;Supervision/Assistance - 24 hour    Equipment Recommendations  None recommended by PT    Recommendations for Other Services       Precautions / Restrictions Restrictions Weight Bearing Restrictions: No      Mobility  Bed Mobility Overal bed mobility: Modified Independent                Transfers Overall transfer level: Needs assistance Equipment used: Right platform walker(1 crutch, pt reports using sometimes due to gout) Transfers: Sit to/from Stand Sit to Stand: Supervision          General transfer comment: supervision for safety, good power up and steadying   Ambulation/Gait Ambulation/Gait assistance: Min guard Ambulation Distance (Feet): 150 Feet Assistive device: Crutches(1 crutch ) Gait Pattern/deviations: Step-through pattern;Decreased weight shift to left;Decreased stance time - left;Shuffle;Antalgic;Drifts right/left Gait velocity: slowed Gait velocity interpretation: Below normal speed for age/gender General Gait Details: hands on min guard for safety, antalgic gait with decreased weightbearing through L LE secondary to L foot gout, vc for visual scanning of environment to compensate for visual field deficit to account for obstacles in her pathway  Stairs Stairs: Yes Stairs assistance: Min guard Stair Management: Two rails;Forwards;Step to pattern Number of Stairs: 3 General stair comments: hands on min guard for safety with ascent/descent of 3 steps, vc for sequencing         Balance Overall balance assessment: Needs assistance Sitting-balance support: Feet supported Sitting balance-Leahy Scale: Good     Standing balance support: Single extremity supported;No upper extremity supported Standing balance-Leahy Scale: Poor Standing balance comment: pt requires crutch to maintain balance in dynamic activity                             Pertinent Vitals/Pain Pain Assessment: 0-10 Pain Score: 8  Faces Pain Scale: Hurts even more Pain Location: headache(L foot with ambulation ) Pain Descriptors / Indicators: Aching;Throbbing Pain Intervention(s): Monitored during session    Home Living Family/patient expects to be discharged to:: Private residence Living Arrangements: Parent Available Help at Discharge: Family Type of Home: House Home Access: Stairs to enter  Entrance Stairs-Number of Steps: 2 Home Layout: One level   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    Prior Function  Level of Independence: Independent                  Extremity/Trunk Assessment   Upper Extremity Assessment Upper Extremity Assessment: Overall WFL for tasks assessed    Lower Extremity Assessment Lower Extremity Assessment: Overall WFL for tasks assessed       Communication   Communication: No difficulties  Cognition Arousal/Alertness: Awake/alert Behavior During Therapy: WFL for tasks assessed/performed Overall Cognitive Status: Within Functional Limits for tasks assessed                                        General Comments General comments (skin integrity, edema, etc.): Pt's mother present throughout session.         Assessment/Plan    PT Assessment Patient needs continued PT services  PT Problem List Decreased activity tolerance;Decreased mobility;Decreased safety awareness;Decreased balance;Pain       PT Treatment Interventions DME instruction;Gait training;Stair training;Functional mobility training;Therapeutic activities;Therapeutic exercise;Balance training;Patient/family education;Cognitive remediation;Neuromuscular re-education    PT Goals (Current goals can be found in the Care Plan section)  Acute Rehab PT Goals Patient Stated Goal: back to normal PT Goal Formulation: With patient Time For Goal Achievement: 08/01/17 Potential to Achieve Goals: Good    Frequency Min 4X/week    AM-PAC PT "6 Clicks" Daily Activity  Outcome Measure Difficulty turning over in bed (including adjusting bedclothes, sheets and blankets)?: A Little Difficulty moving from lying on back to sitting on the side of the bed? : A Little Difficulty sitting down on and standing up from a chair with arms (e.g., wheelchair, bedside commode, etc,.)?: A Little Help needed moving to and from a bed to chair (including a wheelchair)?: A Little Help needed walking in hospital room?: A Little Help needed climbing 3-5 steps with a railing? : A Little 6 Click Score:  18    End of Session Equipment Utilized During Treatment: Back brace Activity Tolerance: Patient limited by pain Patient left: in bed;with call bell/phone within reach Nurse Communication: Mobility status PT Visit Diagnosis: Unsteadiness on feet (R26.81);Other abnormalities of gait and mobility (R26.89);Difficulty in walking, not elsewhere classified (R26.2);Other symptoms and signs involving the nervous system (R29.898);Pain Pain - Right/Left: Left Pain - part of body: Ankle and joints of foot    Time: 1345-1405 PT Time Calculation (min) (ACUTE ONLY): 20 min   Charges:   PT Evaluation $PT Eval Low Complexity: 1 Low     PT G Codes:        Suzanne Graham PT, DPT Acute Rehabilitation  [phone] Pager [phone]    Suzanne Graham 07/18/2017, 2:33 PM

## 2017-07-18 NOTE — Progress Notes (Signed)
PROGRESS NOTE    Suzanne BentonShawnell Graham  UJW:[id number]:[id number] DOB: 05/27/1981 DOA: 07/17/2017 PCP: Patient, No Pcp Per   Chief Complaint  Patient presents with  . Blurred Vision    Brief Narrative:  Admitted for CVA Assessment & Plan   Vision changes/Acute-subacute CVA -CT head:Low-density in the left occipital lobe compatible with acute to subacute infarct -MRI brain:Several areas of acute/early subacute infarctions of the left posterior cerebral artery distribution. -CTA head and neck:Bilateral P2 short segments of occlusion/near occlusion with distal reconstitution. Otherwise negative CTA of head and neck -Echocardiogram: pending -LDL and hemoglobin A1c pending  -PT/OT consulted -Neurology consulted and appreciated, pending further recommendations -Continueaspirin, statin  Hypokalemia -Pending repeat BMP  Asymptomatic bacteriuria -UA showed 6-30 WBC, rare bacteria, large leukocytes, positive nitrites -Patient remains asymptomatic mother for give antibiotics -Urine culture pending and can be followed up as an outpatient  Essential hypertension -Continue lisinopril, HCTZ  Gout flare, left ankle -Given colchicine on admission, will give additional dose of 0.6 mg given today -Continue to monitor  Tobacco abuse -discussed cessation  Plantarfasciitis -Follow-up as an outpatient  Depression -Outpatient follow up  DVT Prophylaxis  Heparin  Code Status: Full  Family Communication: none at bedside  Disposition Plan: Admitted, pending CVA workup  Consultants Neurology  Procedures  None  Antibiotics   Anti-infectives (From admission, onward)   Start     Dose/Rate Route Frequency Ordered Stop   07/17/17 1745  cefTRIAXone (ROCEPHIN) 1 g in dextrose 5 % 50 mL IVPB     1 g 100 mL/hr over 30 Minutes Intravenous  Once 07/17/17 1744 07/17/17 1851      Subjective:   Suzanne BentonShawnell Graham seen and examined today.  Feels left ankle pain has improved. Continues to complain  of right vision blurriness. Denies chest pain, shortness of breath, abdominal pain, nausea vomiting, diarrhea or constipation.  Objective:   Vitals:   07/18/17 0000 07/18/17 0111 07/18/17 0525 07/18/17 1032  BP: (!) 143/84  134/82 (!) 145/95  Pulse: 83 75 92 69  Resp: 18 20 18 18   Temp: 98.9 F (37.2 C)  98.6 F (37 C) 98.9 F (37.2 C)  TempSrc: Oral  Oral Oral  SpO2: 94% 92% 93% 96%  Weight: 105.3 kg (232 lb 2.3 oz)     Height: 5\' 4"  (1.626 m)       Intake/Output Summary (Last 24 hours) at 07/18/2017 1054 Last data filed at 07/18/2017 0805 Gross per 24 hour  Intake 561.67 ml  Output -  Net 561.67 ml   Filed Weights   07/18/17 0000  Weight: 105.3 kg (232 lb 2.3 oz)    Exam  General: Well developed, well nourished, NAD, appears stated age  HEENT: NCAT, mucous membranes moist.   Cardiovascular: S1 S2 auscultated, no rubs, murmurs or gallops. Regular rate and rhythm.  Respiratory: Clear to auscultation bilaterally with equal chest rise  Abdomen: Soft, obese, nontender, nondistended, + bowel sounds  Extremities: warm dry without cyanosis clubbing or edema. Left ankle wrapped  Neuro: AAOx3, Strength 5/5 in patient's upper and lower extremities bilaterally  Psych: Normal affect and demeanor with intact judgement and insight   Data Reviewed: I have personally reviewed following labs and imaging studies  CBC: Recent Labs  Lab 07/17/17 1638 07/17/17 1648 07/17/17 2134  WBC 7.3  --  6.8  NEUTROABS 4.4  --   --   HGB 12.2 14.3 11.8*  HCT 38.3 42.0 37.1  MCV 75.8*  --  75.9*  PLT 240  --  236   Basic Metabolic Panel: Recent Labs  Lab 07/17/17 1638 07/17/17 1648 07/17/17 2134  NA 138 141  --   K 3.2* 3.2*  --   CL 102 100*  --   CO2 28  --   --   GLUCOSE 92 92  --   BUN 11 8  --   CREATININE 0.99 0.90 0.85  CALCIUM 9.8  --   --    GFR: Estimated Creatinine Clearance: 106.1 mL/min (by C-G formula based on SCr of 0.85 mg/dL). Liver Function  Tests: Recent Labs  Lab 07/17/17 1638  AST 18  ALT 16  ALKPHOS 41  BILITOT 0.8  PROT 8.2*  ALBUMIN 4.0   No results for input(s): LIPASE, AMYLASE in the last 168 hours. No results for input(s): AMMONIA in the last 168 hours. Coagulation Profile: Recent Labs  Lab 07/17/17 1638  INR 1.07   Cardiac Enzymes: No results for input(s): CKTOTAL, CKMB, CKMBINDEX, TROPONINI in the last 168 hours. BNP (last 3 results) No results for input(s): PROBNP in the last 8760 hours. HbA1C: No results for input(s): HGBA1C in the last 72 hours. CBG: No results for input(s): GLUCAP in the last 168 hours. Lipid Profile: No results for input(s): CHOL, HDL, LDLCALC, TRIG, CHOLHDL, LDLDIRECT in the last 72 hours. Thyroid Function Tests: No results for input(s): TSH, T4TOTAL, FREET4, T3FREE, THYROIDAB in the last 72 hours. Anemia Panel: No results for input(s): VITAMINB12, FOLATE, FERRITIN, TIBC, IRON, RETICCTPCT in the last 72 hours. Urine analysis:    Component Value Date/Time   COLORURINE YELLOW 07/17/2017 1629   APPEARANCEUR CLOUDY (A) 07/17/2017 1629   LABSPEC 1.014 07/17/2017 1629   PHURINE 5.0 07/17/2017 1629   GLUCOSEU NEGATIVE 07/17/2017 1629   HGBUR SMALL (A) 07/17/2017 1629   BILIRUBINUR NEGATIVE 07/17/2017 1629   KETONESUR NEGATIVE 07/17/2017 1629   PROTEINUR NEGATIVE 07/17/2017 1629   NITRITE POSITIVE (A) 07/17/2017 1629   LEUKOCYTESUR LARGE (A) 07/17/2017 1629   Sepsis Labs: @LABRCNTIP (procalcitonin:4,lacticidven:4)  )No results found for this or any previous visit (from the past 240 hour(s)).    Radiology Studies: Ct Angio Head W Or Wo Contrast  Result Date: 07/17/2017 CLINICAL DATA:  37 y/o  F; vision changes. EXAM: CT ANGIOGRAPHY HEAD AND NECK TECHNIQUE: Multidetector CT imaging of the head and neck was performed using the standard protocol during bolus administration of intravenous contrast. Multiplanar CT image reconstructions and MIPs were obtained to evaluate the  vascular anatomy. Carotid stenosis measurements (when applicable) are obtained utilizing NASCET criteria, using the distal internal carotid diameter as the denominator. CONTRAST:  ISOVUE-370 IOPAMIDOL (ISOVUE-370) INJECTION 76% COMPARISON:  Same-day MRI of the brain. FINDINGS: CTA NECK FINDINGS Aortic arch: Bovine variant branching. Imaged portion shows no evidence of aneurysm or dissection. No significant stenosis of the major arch vessel origins. Mild calcific atherosclerosis. Right carotid system: No evidence of dissection, stenosis (50% or greater) or occlusion. Left carotid system: No evidence of dissection, stenosis (50% or greater) or occlusion. Vertebral arteries: Left dominant. No evidence of dissection, stenosis (50% or greater) or occlusion. Skeleton: Negative. Other neck: Negative. Upper chest: 13 mm left cheek dermal cyst. Review of the MIP images confirms the above findings CTA HEAD FINDINGS Anterior circulation: No significant stenosis, proximal occlusion, aneurysm, or vascular malformation. Posterior circulation: Bilateral P2 short segments of occlusion/near occlusion with distal reconstitution (series 12, image 23 and 17 as well as series 8, image 103). Otherwise no large vessel occlusion, aneurysm, or significant stenosis is identified. Venous sinuses: As  permitted by contrast timing, patent. Anatomic variants: Anterior and bilateral posterior communicating arteries are present. Delayed phase: No abnormal intracranial enhancement. Review of the MIP images confirms the above findings IMPRESSION: 1. Negative CTA of the neck. 2. Bilateral P2 short segments of occlusion/near occlusion with distal reconstitution. Given patient's age and symmetry consideration for vasculitis, reversible cerebral vasoconstriction syndrome, or vasospasm should be considered in addition to thromboembolic disease. 3. Otherwise negative CTA of the head. These results were called by telephone at the time of  interpretation on 07/17/2017 at 6:19 pm to Dr. Erma Heritage , who verbally acknowledged these results. Electronically Signed   By: Mitzi Hansen M.D.   On: 07/17/2017 18:31   Ct Head Wo Contrast  Result Date: 07/17/2017 CLINICAL DATA:  Right eye blurred vision. EXAM: CT HEAD WITHOUT CONTRAST TECHNIQUE: Contiguous axial images were obtained from the base of the skull through the vertex without intravenous contrast. COMPARISON:  None. FINDINGS: Brain: Low-density noted in the left occipital lobe compatible with infarction. No hemorrhage or hydrocephalus. No mass lesion. Vascular: No hyperdense vessel or unexpected calcification. Skull: No acute calvarial abnormality. Sinuses/Orbits: No acute finding. Other: None IMPRESSION: Low-density in the left occipital lobe compatible with acute to subacute infarct. Electronically Signed   By: Charlett Nose M.D.   On: 07/17/2017 15:49   Ct Angio Neck W Or Wo Contrast  Result Date: 07/17/2017 CLINICAL DATA:  37 y/o  F; vision changes. EXAM: CT ANGIOGRAPHY HEAD AND NECK TECHNIQUE: Multidetector CT imaging of the head and neck was performed using the standard protocol during bolus administration of intravenous contrast. Multiplanar CT image reconstructions and MIPs were obtained to evaluate the vascular anatomy. Carotid stenosis measurements (when applicable) are obtained utilizing NASCET criteria, using the distal internal carotid diameter as the denominator. CONTRAST:  ISOVUE-370 IOPAMIDOL (ISOVUE-370) INJECTION 76% COMPARISON:  Same-day MRI of the brain. FINDINGS: CTA NECK FINDINGS Aortic arch: Bovine variant branching. Imaged portion shows no evidence of aneurysm or dissection. No significant stenosis of the major arch vessel origins. Mild calcific atherosclerosis. Right carotid system: No evidence of dissection, stenosis (50% or greater) or occlusion. Left carotid system: No evidence of dissection, stenosis (50% or greater) or occlusion. Vertebral arteries:  Left dominant. No evidence of dissection, stenosis (50% or greater) or occlusion. Skeleton: Negative. Other neck: Negative. Upper chest: 13 mm left cheek dermal cyst. Review of the MIP images confirms the above findings CTA HEAD FINDINGS Anterior circulation: No significant stenosis, proximal occlusion, aneurysm, or vascular malformation. Posterior circulation: Bilateral P2 short segments of occlusion/near occlusion with distal reconstitution (series 12, image 23 and 17 as well as series 8, image 103). Otherwise no large vessel occlusion, aneurysm, or significant stenosis is identified. Venous sinuses: As permitted by contrast timing, patent. Anatomic variants: Anterior and bilateral posterior communicating arteries are present. Delayed phase: No abnormal intracranial enhancement. Review of the MIP images confirms the above findings IMPRESSION: 1. Negative CTA of the neck. 2. Bilateral P2 short segments of occlusion/near occlusion with distal reconstitution. Given patient's age and symmetry consideration for vasculitis, reversible cerebral vasoconstriction syndrome, or vasospasm should be considered in addition to thromboembolic disease. 3. Otherwise negative CTA of the head. These results were called by telephone at the time of interpretation on 07/17/2017 at 6:19 pm to Dr. Erma Heritage , who verbally acknowledged these results. Electronically Signed   By: Mitzi Hansen M.D.   On: 07/17/2017 18:31   Mr Brain Wo Contrast  Result Date: 07/17/2017 CLINICAL DATA:  37 y/o F; right-sided eye  blurriness and visual field changes. EXAM: MRI HEAD WITHOUT CONTRAST TECHNIQUE: Multiplanar, multiecho pulse sequences of the brain and surrounding structures were obtained without intravenous contrast. COMPARISON:  07/17/2016 CT head.  CT angiogram of head. FINDINGS: Brain: Multiple foci of reduced diffusion are present throughout the left PCA distribution involving left medial temporal lobe, occipital lobe, left splenium of  corpus callosum, and left thalamus compatible with acute/early subacute infarction. Areas of infarction demonstrate T2 FLAIR hyperintense signal abnormality. No significant mass effect or hemorrhage. Fewnonspecific foci of T2 FLAIR hyperintense signal abnormality in subcortical and periventricular white matter are compatible withmildchronic microvascular ischemic changes for age. No extra-axial collection, hydrocephalus, or effacement of basilar cisterns. No herniation. Vascular: Refer to same-day CT angiogram of head. Skull and upper cervical spine: Normal marrow signal. Sinuses/Orbits: Moderate maxillary and anterior ethmoid sinus mucosal thickening. No abnormal signal of mastoid air cells. Orbits are unremarkable. Other: None. IMPRESSION: Several areas of acute/early subacute infarction within the left posterior cerebral artery distribution. No hemorrhage or mass effect. Electronically Signed   By: Mitzi Hansen M.D.   On: 07/17/2017 18:16     Scheduled Meds: . aspirin  325 mg Oral Daily  . colchicine  0.6 mg Oral Once  . heparin  5,000 Units Subcutaneous Q8H  . lisinopril  20 mg Oral Daily   And  . hydrochlorothiazide  12.5 mg Oral Daily  . pravastatin  80 mg Oral q1800   Continuous Infusions:   LOS: 1 day   Time Spent in minutes   30 minutes  Ahmia Colford D.O. on 07/18/2017 at 10:54 AM  Between 7am to 7pm - Pager - [phone]  After 7pm go to [email or web address] - password [email or web address]  And look for the night coverage person covering for me after hours  [facility] Office  [phone]

## 2017-07-18 NOTE — Progress Notes (Signed)
  Echocardiogram 2D Echocardiogram has been performed.  Suzanne SkeenVijay  [name] 07/18/2017, 11:27 AM

## 2017-07-18 NOTE — Progress Notes (Addendum)
NEUROHOSPITALISTS STROKE TEAM - DAILY PROGRESS NOTE   ADMISSION HISTORY: Suzanne Graham is a 37 y.o. female past history of asthma, depression and anxiety, gout and hypertension, along with chronic headaches, who presented to the emergency room for evaluation of headache and blurry vision. She said that she is been having a headache, throbbing kind in whole of her head, with no aggravating or relieving factors.  Along with this headache, she also started having blurred vision which initially was intermittent but then became sustained.  She came to the ER a day or so ago.  She was treated for the headache and sent home with outpatient follow-up.  Her headaches did not resolve.  She presented back again to the emergency room for further evaluation.  A noncontrast CT of the head was done which was concerning for posterior circulation stroke.  MRI of the brain was done that showed several areas of acute/early subacute infarct within the left PCA distribution.  She was admitted for further stroke workup. Neurology consultation was obtained for the stroke. Getting more history about her headache, she says she is always had headaches but her headaches do not last as long as this particular one lasted for these many days.  Her headaches usually last a few hours and are relieved by NSAIDs.  She currently smokes over a pack per day.  She also drinks alcohol-at least 32 ounces of beer a day.  She is not on birth control.  There is no family history of strokes in the young.  No family history of heart attacks in the young.  No family history of miscarriages.  LKW: 3 days ago tpa given?: no, outside the window Premorbid modified Rankin scale (mRS): 0 NIHSS = 2  SUBJECTIVE (INTERVAL HISTORY) No family is at the bedside. Patient is found laying in bed in NAD. Overall she feels her condition is unchanged. Voices no new complaints. No new events reported  overnight. She denies any prior history of TIA, strokes, recurrent miscarriages, DVT or pulmonary embolism. There is no family history of strokes or heart attacks at a young age.   OBJECTIVE Lab Results: CBC:  Recent Labs  Lab 07/17/17 1638 07/17/17 1648 07/17/17 2134  WBC 7.3  --  6.8  HGB 12.2 14.3 11.8*  HCT 38.3 42.0 37.1  MCV 75.8*  --  75.9*  PLT 240  --  236   BMP: Recent Labs  Lab 07/17/17 1638 07/17/17 1648 07/17/17 2134 07/18/17 0938  NA 138 141  --  137  K 3.2* 3.2*  --  3.6  CL 102 100*  --  101  CO2 28  --   --  26  GLUCOSE 92 92  --  101*  BUN 11 8  --  7  CREATININE 0.99 0.90 0.85 0.97  CALCIUM 9.8  --   --  9.8   Liver Function Tests:  Recent Labs  Lab 07/17/17 1638  AST 18  ALT 16  ALKPHOS 41  BILITOT 0.8  PROT 8.2*  ALBUMIN 4.0   Coagulation Studies:  Recent Labs    07/17/17 1638  APTT 32  INR 1.07   Urinalysis:  Recent Labs  Lab 07/17/17 1629  COLORURINE YELLOW  APPEARANCEUR CLOUDY*  LABSPEC 1.014  PHURINE 5.0  GLUCOSEU NEGATIVE  HGBUR SMALL*  BILIRUBINUR NEGATIVE  KETONESUR NEGATIVE  PROTEINUR NEGATIVE  NITRITE POSITIVE*  LEUKOCYTESUR LARGE*   Urine Drug Screen:     Component Value Date/Time   LABOPIA NONE DETECTED 07/17/2017 1629  COCAINSCRNUR NONE DETECTED 07/17/2017 1629   LABBENZ NONE DETECTED 07/17/2017 1629   AMPHETMU NONE DETECTED 07/17/2017 1629   THCU NONE DETECTED 07/17/2017 1629   LABBARB NONE DETECTED 07/17/2017 1629    Alcohol Level:  Recent Labs  Lab 07/17/17 1638  ETH <10    PHYSICAL EXAM Temp:  [98.6 F (37 C)-98.9 F (37.2 C)] 98.9 F (37.2 C) (01/11 1032) Pulse Rate:  [69-92] 69 (01/11 1032) Resp:  [15-21] 18 (01/11 1032) BP: (129-177)/(82-110) 145/95 (01/11 1032) SpO2:  [92 %-100 %] 96 % (01/11 1032) Weight:  [105.3 kg (232 lb 2.3 oz)] 105.3 kg (232 lb 2.3 oz) (01/11 0000) General - Well nourished, well developed, in no apparent distress HEENT-  Normocephalic, Normal external  eye/conjunctiva.  Normal external ears. Normal external nose, mucus membranes and septum.   Cardiovascular - Regular rate and rhythm  Respiratory - Lungs clear bilaterally. No wheezing. Abdomen - soft and non-tender, BS normal Extremities- no edema or cyanosis NEURO:  Mental Status: AA&Ox3  Language: speech is clear.  Naming, repetition, fluency, and comprehension intact. Cranial Nerves: PERR. EOMI, visual fields examination shows  Dense right homonymous hemianopsia, no facial asymmetry, facial sensation intact, hearing intact, tongue/uvula/soft palate midline, normal sternocleidomastoid and trapezius muscle strength. No evidence of tongue atrophy or fibrillations Motor: 5/5 all over with the exception of the left foot plantar and dorsiflexion because of the swelling on the left ankle. Tone: is normal and bulk is normal Sensation- Intact to light touch bilaterally Coordination: FTN intact bilaterally Gait- deferred  IMAGING: I have personally reviewed the radiological images below and agree with the radiology interpretations.  Ct Head Wo Contrast Result Date: 07/17/2017 IMPRESSION: Low-density in the left occipital lobe compatible with acute to subacute infarct. Electronically Signed   By: Charlett Nose M.D.   On: 07/17/2017 15:49   Ct Angio Head and Neck W Or Wo Contrast Result Date: 07/17/2017 IMPRESSION: 1. Negative CTA of the neck. 2. Bilateral P2 short segments of occlusion/near occlusion with distal reconstitution. Given patient's age and symmetry consideration for vasculitis, reversible cerebral vasoconstriction syndrome, or vasospasm should be considered in addition to thromboembolic disease. 3. Otherwise negative CTA of the head. These results were called by telephone at the time of interpretation on 07/17/2017 at 6:19 pm to Dr. Erma Heritage , who verbally acknowledged these results. Electronically Signed   By: Mitzi Hansen M.D.   On: 07/17/2017 18:31   Mr Brain Wo  Contrast Result Date: 07/17/2017 IMPRESSION: Several areas of acute/early subacute infarction within the left posterior cerebral artery distribution. No hemorrhage or mass effect. Electronically Signed   By: Mitzi Hansen M.D.   On: 07/17/2017 18:16   Echocardiogram:                                               Study Conclusions - Left ventricle: The cavity size was normal. There was severe   concentric hypertrophy. Systolic function was normal. The   estimated ejection fraction was in the range of 60% to 65%. Wall   motion was normal; there were no regional wall motion   abnormalities. Left ventricular diastolic function parameters   were normal.  B/L LE Duplex:  PENDING TCD:                                                                   PENDING    IMPRESSION: Suzanne Graham is a 37 y.o. female with PMH of HTN, HLD,smoking, obesity, presenting with headaches and right homonymous hemianopsia, initially thought to be possible complex migraine but because of unremitting headaches received brain imaging that revealed embolic-looking strokes in the left PCA territory.  The CT angiogram of the head shows bilateral P2 short segment occlusions with distal recanalization and the overall appearance of intracerebral vessels is concerning for RCVS versus vasculitis versus atherosclerosis. MRI reveals:  Acute/early subacute infarction within the left posterior cerebral artery distribution  Suspected Etiology:   cardioembolic vs unknown Resultant Symptoms: right homonymous hemianopsia, Stroke Risk Factors: hyperlipidemia and hypertension, Family Hx of CVA Other Stroke Risk Factors: Cigarette smoker, Obesity, Body mass index is 39.85 kg/m.  Migraines, OSA/Likely undx, Gout  Outstanding Stroke Work-up Studies:     B/L LE Duplex:                                                       PENDING TEE /Loop Recorder:                                              PENDING TCD:                                                                      PENDING Hypercoagulable Labs                                          PENDING  07/18/2017 ASSESSMENT:   Neuro exam stable. Continues to complain of Right vision deficit. Denies H/A on exam. Multiple tests pending. Long discussion with patient regarding stroke and POC.  PLAN  07/18/2017: Continue Aspirin/ Statin Frequent neuro checks Telemetry monitoring PT/OT/SLP Outpatient TEE and Loop Recorder Placement- Cardiology aware Needs outpatient OSA - Sleep study Ongoing aggressive stroke risk factor management Patient counseled to be compliant with her antithrombotic medications Patient counseled on Lifestyle modifications including, Diet, Exercise, and Stress Follow up with [facility] in 6 weeks  R/O AFIB: Outpatient TEE and Loop Recorder Placement - Cardiology notified  HYPERTENSION: Stable Permissive hypertension (OK if <220/120) for 24-48 hours post stroke and then gradually normalized within 5-7 days. Long term BP goal normotensive. May slowly restart home B/P medications today Home Meds: Lisinopril-HCTZ  HYPERLIPIDEMIA:    Component Value Date/Time   CHOL 162 07/18/2017 0938   TRIG 80 07/18/2017 0938   HDL 48 07/18/2017 1610  CHOLHDL 3.4 07/18/2017 0938   VLDL 16 07/18/2017 0938   LDLCALC 98 07/18/2017 0938  Home Meds:  NONE LDL  goal < 70 Started on Lipitor to 20 mg daily Continue statin at discharge  R/O DIABETES: Lab Results  Component Value Date   HGBA1C 5.1 07/18/2017  HgbA1c goal < 7.0  TOBACCO ABUSE Current smoker Smoking cessation counseling provided Nicotine patch provided  OBESITY Obesity, Body mass index is 39.85 kg/m. Greater than/equal to 30  Other Active Problems: Active Problems:   Stroke [facility](HCC)    Hospital day # 1 VTE prophylaxis: Heparin Diet : Diet Heart Room service appropriate? Yes; Fluid consistency: Thin   FAMILY  UPDATES: No family at bedside  TEAM UPDATES: Edsel PetrinMikhail, Maryann, DO   Prior Home Stroke Medications:  No antithrombotic  Discharge Stroke Meds:  Please discharge patient on aspirin 325 mg daily   Disposition: 01-Home or Self Care Therapy Recs:               [facility] Home Equipment:         TBD Follow Up:  Follow-up Information    Micki RileySethi, Damante Spragg S, MD. Schedule an appointment as soon as possible for a visit in 6 week(s).   Specialties:  Neurology, Radiology Contact information: [address] [place] [address] [phone]          Patient, No Pcp Per -PCP Follow up in 1-2 weeks   Case Management aware of need   Assessment & plan discussed with with attending physician and they are in agreement.    Beryl MeagerMary A Costello, ANP-C Stroke Neurology Team 07/18/2017 2:02 PM I have personally examined this patient, reviewed notes, independently viewed imaging studies, participated in medical decision making and plan of care.ROS completed by me personally and pertinent positives fully documented  I have made any additions or clarifications directly to the above note. Agree with note above.  She has presented with a subacute left PCA infarct of embolic etiology and source to be determined. Check echocardiogram, bubble study for PFO, lab work for vasculitis and hypercoagulable disorders. Sickle cell screen. Start aspirin for stroke prevention and aggressive risk factor modification. Patient was counseled to quit smoking cigarettes and limit alcohol use not more than 1 or 2 drinks per day. Review of telemetry monitoring strips since admission has not shown any evidence of paroxysmal atrial fibrillation. Greater than 50% time during this 35 minute visit was spent on counseling and coordination of care about her stroke and related workup and answered questions Patient was advised not to drive. Patient has expressed interest in participation in the [facility]  Delia HeadyPramod Cassiel Fernandez,  MD Medical Director [facility] Pager: [phone] 07/18/2017 2:31 PM  To contact Stroke Continuity provider, please refer to [email or web address]. After hours, contact General Neurology

## 2017-07-18 NOTE — Consult Note (Signed)
Neurology Consultation  Reason for Consult: Stroke Referring Physician: Dr. Erma Heritage  CC: Blurred vision, headache  History is obtained from: Chart, patient  HPI: Suzanne Graham is a 37 y.o. female past history of asthma, depression and anxiety, gout and hypertension, along with chronic headaches, who presented to the emergency room for evaluation of headache and blurry vision. She said that she is been having a headache, throbbing kind in whole of her head, with no aggravating or relieving factors.  Along with this headache, she also started having blurred vision which initially was intermittent but then became sustained.  She came to the ER a day or so ago.  She was treated for the headache and sent home with outpatient follow-up.  Her headaches did not resolve.  She presented back again to the emergency room for further evaluation.  A noncontrast CT of the head was done which was concerning for posterior circulation stroke.  MRI of the brain was done that showed several areas of acute/early subacute infarct within the left PCA distribution.  She was admitted for further stroke workup. Neurology consultation was obtained for the stroke. Getting more history about her headache, she says she is always had headaches but her headaches do not last as long as this particular one lasted for these many days.  Her headaches usually last a few hours and are relieved by NSAIDs.  She currently smokes over a pack per day.  She also drinks alcohol-at least 32 ounces of beer a day.  She is not on birth control.  There is no family history of strokes in the young.  No family history of heart attacks in the young.  No family history of miscarriages.  LKW: 3 days ago tpa given?: no, outside the window Premorbid modified Rankin scale (mRS): 0  ROS: Review of Systems  Constitutional: Negative for chills and fever.  HENT: Negative for hearing loss and tinnitus.   Eyes: Positive for blurred vision, double vision and  photophobia. Negative for pain, discharge and redness.  Respiratory: Negative for cough and hemoptysis.   Cardiovascular: Negative for chest pain and palpitations.  Gastrointestinal: Negative for heartburn and nausea.  Genitourinary: Negative for dysuria and urgency.  Musculoskeletal: Negative for myalgias and neck pain.  Skin: Negative for itching and rash.  Neurological: Positive for headaches. Negative for dizziness, tingling, tremors, sensory change, speech change, focal weakness, seizures and loss of consciousness.  Endo/Heme/Allergies: Negative for environmental allergies. Does not bruise/bleed easily.  Psychiatric/Behavioral: Positive for depression. Negative for substance abuse and suicidal ideas. The patient is nervous/anxious.    Past Medical History:  Diagnosis Date  . Asthma   . Depression   . Gout   . Hypertension     Family History  Problem Relation Age of Onset  . Heart failure Mother   . Hypertension Mother   . Stroke Neg Hx     Social History:   reports that she has been smoking cigarettes.  She has been smoking about 0.50 packs per day. she has never used smokeless tobacco. She reports that she drinks alcohol. She reports that she does not use drugs.  Medications  Current Facility-Administered Medications:  .  0.9 %  sodium chloride infusion, , Intravenous, Continuous, Haydee Salter, MD, Last Rate: 50 mL/hr at 07/18/17 0119 .  acetaminophen (TYLENOL) tablet 650 mg, 650 mg, Oral, Q4H PRN **OR** acetaminophen (TYLENOL) solution 650 mg, 650 mg, Per Tube, Q4H PRN **OR** acetaminophen (TYLENOL) suppository 650 mg, 650 mg, Rectal, Q4H PRN, Melynda Ripple,  Talmadge Coventry, MD .  albuterol (PROVENTIL) (2.5 MG/3ML) 0.083% nebulizer solution 2.5 mg, 2.5 mg, Nebulization, Q2H PRN, Haydee Salter, MD .  aspirin tablet 325 mg, 325 mg, Oral, Daily, Haydee Salter, MD .  heparin injection 5,000 Units, 5,000 Units, Subcutaneous, Q8H, Haydee Salter, MD .  hydrALAZINE (APRESOLINE)  injection 10 mg, 10 mg, Intravenous, Q8H PRN, Haydee Salter, MD .  lisinopril (PRINIVIL,ZESTRIL) tablet 20 mg, 20 mg, Oral, Daily **AND** hydrochlorothiazide (MICROZIDE) capsule 12.5 mg, 12.5 mg, Oral, Daily, Haydee Salter, MD .  nicotine (NICODERM CQ - dosed in mg/24 hours) patch 14 mg, 14 mg, Transdermal, Daily PRN, Haydee Salter, MD .  pravastatin (PRAVACHOL) tablet 80 mg, 80 mg, Oral, q1800, Haydee Salter, MD .  senna-docusate (Senokot-S) tablet 1 tablet, 1 tablet, Oral, QHS PRN, Haydee Salter, MD   Exam: Current vital signs: BP (!) 143/84 (BP Location: Right Arm)   Pulse 75   Temp 98.9 F (37.2 C) (Oral)   Resp 20   Ht 5\' 4"  (1.626 m)   Wt 105.3 kg (232 lb 2.3 oz)   LMP 07/14/2017   SpO2 92%   BMI 39.85 kg/m   Vital signs in last 24 hours: Temp:  [98.8 F (37.1 C)-98.9 F (37.2 C)] 98.9 F (37.2 C) (01/11 0000) Pulse Rate:  [71-87] 75 (01/11 0111) Resp:  [15-21] 20 (01/11 0111) BP: (129-177)/(84-110) 143/84 (01/11 0000) SpO2:  [92 %-100 %] 92 % (01/11 0111) Weight:  [105.3 kg (232 lb 2.3 oz)] 105.3 kg (232 lb 2.3 oz) (01/11 0000)  GENERAL: A very pleasant overweight woman, awake, alert in NAD HEENT: - Normocephalic and atraumatic, dry mm, no LN++, no Thyromegally LUNGS - Clear to auscultation bilaterally with no wheezes CV - S1S2 RRR, no m/r/g, equal pulses bilaterally. ABDOMEN - Soft, nontender, nondistended with normoactive BS Ext: warm, well perfused, intact peripheral pulses, left ankle wrapped in bandage attributed to a gout flareup  NEURO:  Mental Status: AA&Ox3  Language: speech is clear.  Naming, repetition, fluency, and comprehension intact. Cranial Nerves: PERR. EOMI, visual fields examination shows right homonymous hemianopsia, no facial asymmetry, facial sensation intact, hearing intact, tongue/uvula/soft palate midline, normal sternocleidomastoid and trapezius muscle strength. No evidence of tongue atrophy or fibrillations Motor: 5/5 all over  with the exception of the left foot plantar and dorsiflexion because of the swelling on the left ankle. Tone: is normal and bulk is normal Sensation- Intact to light touch bilaterally Coordination: FTN intact bilaterally Gait- deferred  NIHSS - 2   Labs I have reviewed labs in epic and the results pertinent to this consultation are:  CBC    Component Value Date/Time   WBC 6.8 07/17/2017 2134   RBC 4.89 07/17/2017 2134   HGB 11.8 (L) 07/17/2017 2134   HCT 37.1 07/17/2017 2134   PLT 236 07/17/2017 2134   MCV 75.9 (L) 07/17/2017 2134   MCH 24.1 (L) 07/17/2017 2134   MCHC 31.8 07/17/2017 2134   RDW 13.9 07/17/2017 2134   LYMPHSABS 2.0 07/17/2017 1638   MONOABS 0.7 07/17/2017 1638   EOSABS 0.2 07/17/2017 1638   BASOSABS 0.0 07/17/2017 1638    CMP     Component Value Date/Time   NA 141 07/17/2017 1648   K 3.2 (L) 07/17/2017 1648   CL 100 (L) 07/17/2017 1648   CO2 28 07/17/2017 1638   GLUCOSE 92 07/17/2017 1648   BUN 8 07/17/2017 1648   CREATININE 0.85 07/17/2017 2134   CALCIUM 9.8 07/17/2017  1638   PROT 8.2 (H) 07/17/2017 1638   ALBUMIN 4.0 07/17/2017 1638   AST 18 07/17/2017 1638   ALT 16 07/17/2017 1638   ALKPHOS 41 07/17/2017 1638   BILITOT 0.8 07/17/2017 1638   GFRNONAA >60 07/17/2017 2134   GFRAA >60 07/17/2017 2134    Imaging I have reviewed the images obtained: CT-scan of the brain -possible hypodensity in the left occipital lobe. CT angiogram of the head and neck shows bilateral P2 short segment occlusions with distal reconstitution, vessels in general look of low caliber. MRI examination of the brain shows multiple areas of restricted diffusion in the left PCA territory including a faint punctate lesion in the left thalamus.  Assessment:  37 year old woman presenting with headaches and right homonymous hemianopsia, initially thought to be possible complex migraine but because of unremitting headaches received brain imaging that revealed embolic-looking  strokes in the left PCA territory.  The CT angiogram of the head shows bilateral P2 short segment occlusions with distal recanalization and the overall appearance of intracerebral vessels is concerning for RCVS versus vasculitis versus atherosclerosis. Given her young age and strokes, she would also benefit from a hypercoagulable workup.  Impression: Acute ischemic stroke in the left PCA territory Evaluate for stroke causes in young Consider evaluation for vasculitis and RCVS Evaluate for PFO  Recommendations: Admit to hospitalist Telemetry monitoring Permissive hypertension-treat only if systolic greater than 220. Normalize blood pressure less than 140 upon discharge Consider diagnostic cerebral angiogram and spinal tap to evaluate for vasculitis versus R CVS Hemoglobin A1c, lipid panel 2D echo cardio gram Aspirin 325 p.o. daily for now Atorvastatin 80 mg p.o. daily for now Risk factor modification-I discussed the importance of exercise as well as cessation of alcohol and tobacco abuse in detail with the patient. PT, OT, speech therapy consults. Hypercoagulable workup per stroke team Might need TEE to evaluate for PFO.  Please page stroke NP/PA/MD (listed on AMION)  from 8am-4 pm as this patient will be followed by the stroke team at this point.  -- Milon DikesAshish Jayvyn Haselton, MD [facility] Pager: [phone] If 7pm to 7am, please call on call as listed on AMION.

## 2017-07-18 NOTE — Discharge Summary (Signed)
Physician Discharge Summary  Suzanne Graham ZOX:[id number] DOB: 10-11-1980 DOA: 07/17/2017  PCP: Patient, No Pcp Per  Admit date: 07/17/2017 Discharge date: 07/18/2017  Time spent: 45 minutes  Recommendations for Outpatient Follow-up:  Patient will be discharged to home with home health physical, occupational, and speech therapy.  Patient will need to follow up with primary care provider within one week of discharge.  Follow up with Dr. Pearlean Brownie, neurology. Cardiology will contact you for TEE. Patient should continue medications as prescribed.  Patient should follow a heart healthy diet.   Discharge Diagnoses:  Vision changes/Acute-subacute CVA Hypokalemia Asymptomatic bacteriuria Essential hypertension Gout flare, left ankle Tobacco abuse Plantar fasciitis Depression  Discharge Condition: Stable  Diet recommendation: heart healthy  Filed Weights   07/18/17 0000  Weight: 105.3 kg (232 lb 2.3 oz)    History of present illness:  On 07/17/2017 by Dr. Eliezer Bottom Suzanne Graham is a 37 y.o. female with past medical history of asthma, depression, gout and hypertension patient states 2 days ago she woke up with blurry vision.  In her right visual field her vision went in and out the first day.  Then the visual loss became permanent.  No history of visual issues.  Family history of stroke.  Personal history of stroke.  Patient states he also has a headache.  Hospital Course:  Vision changes/Acute-subacute CVA -CT head:Low-density in the left occipital lobe compatible with acute to subacute infarct -MRI brain:Several areas of acute/early subacute infarctions of the left posterior cerebral artery distribution. -CTA head and neck:Bilateral P2 short segments of occlusion/near occlusion with distal reconstitution. Otherwise negative CTA of head and neck -Echocardiogram: pending -LDL 98 and hemoglobin A1c 5.1 -PT/OT consulted, recommended [facility] -Neurology consulted and appreciated -TCD  bubble study negative for PFO -LE doppler negative for DVT -Continueaspirin, statin -patient will be contacted by cardiology for outpatient TEE -Hypercoagulable workup pending and can be followed up by neurology -ANA pending -patient will participate in the [facility] registry  Hypokalemia -replaced and resolved  Asymptomatic bacteriuria -UA showed 6-30 WBC, rare bacteria, large leukocytes, positive nitrites -Patient remains asymptomatic mother for give antibiotics -Urine culture pending and can be followed up as an outpatient  Essential hypertension -Continue lisinopril, HCTZ  Gout flare, left ankle -Given colchicine on admission, was given additional dose of 0.6 mg given today  Tobacco abuse/Alcohol abuse -discussed cessation -will discharge with nicotine patch  Plantarfasciitis -Follow-up as an outpatient  Depression -Outpatient follow up  Procedures: Echocardiogram  Consultations: Neurology  Discharge Exam: Vitals:   07/18/17 1032 07/18/17 1415  BP: (!) 145/95 139/88  Pulse: 69 77  Resp: 18 18  Temp: 98.9 F (37.2 C) 98 F (36.7 C)  SpO2: 96% 98%   Exam  General: Well developed, well nourished, NAD, appears stated age  HEENT: NCAT, mucous membranes moist.   Cardiovascular: S1 S2 auscultated, no rubs, murmurs or gallops. Regular rate and rhythm.  Respiratory: Clear to auscultation bilaterally with equal chest rise  Abdomen: Soft, obese, nontender, nondistended, + bowel sounds  Extremities: warm dry without cyanosis clubbing or edema. Left ankle wrapped  Neuro: AAOx3, Strength 5/5 in patient's upper and lower extremities bilaterally  Psych: Normal affect and demeanor with intact judgement and insight  Discharge Instructions Discharge Instructions    Ambulatory referral to Neurology   Complete by:  As directed    An appointment is requested in approximately: 6 weeks Follow up with stroke clinic (Dr  Pearlean Brownie  preferred, if not available,  then consider Sylvie Farrier,  [place] or Lucia Gaskins whoever is available) at [facility] in about 6-8 weeks. Thanks.   Discharge instructions   Complete by:  As directed    Patient will be discharged to home with home health physical, occupational, and speech therapy.  Patient will need to follow up with primary care provider within one week of discharge.  Follow up with Dr. Pearlean Brownie, neurology. Cardiology will contact you for TEE. Patient should continue medications as prescribed.  Patient should follow a heart healthy diet.     Allergies as of 07/18/2017   No Known Allergies     Medication List    STOP taking these medications   aspirin-acetaminophen-caffeine 250-250-65 MG tablet Commonly known as:  EXCEDRIN MIGRAINE   ibuprofen 200 MG tablet Commonly known as:  ADVIL,MOTRIN   meloxicam 15 MG tablet Commonly known as:  MOBIC   predniSONE 10 MG tablet Commonly known as:  DELTASONE     TAKE these medications   albuterol 108 (90 Base) MCG/ACT inhaler Commonly known as:  PROVENTIL HFA;VENTOLIN HFA Inhale 2 puffs into the lungs every 4 (four) hours as needed for wheezing or shortness of breath.   aspirin 325 MG tablet Take 1 tablet (325 mg total) by mouth daily. Start taking on:  07/19/2017   atorvastatin 20 MG tablet Commonly known as:  LIPITOR Take 1 tablet (20 mg total) by mouth daily at 6 PM.   lisinopril-hydrochlorothiazide 20-12.5 MG tablet Commonly known as:  ZESTORETIC Take 1 tablet by mouth daily.   nicotine 14 mg/24hr patch Commonly known as:  NICODERM CQ - dosed in mg/24 hours Place 1 patch (14 mg total) onto the skin daily as needed (nicotine craving).      No Known Allergies Follow-up Information    Micki Riley, MD. Schedule an appointment as soon as possible for a visit in 6 week(s).   Specialties:  Neurology, Radiology Contact information: [address] [place] [address] [phone]            The results of significant diagnostics from  this hospitalization (including imaging, microbiology, ancillary and laboratory) are listed below for reference.    Significant Diagnostic Studies: Ct Angio Head W Or Wo Contrast  Result Date: 07/17/2017 CLINICAL DATA:  37 y/o  F; vision changes. EXAM: CT ANGIOGRAPHY HEAD AND NECK TECHNIQUE: Multidetector CT imaging of the head and neck was performed using the standard protocol during bolus administration of intravenous contrast. Multiplanar CT image reconstructions and MIPs were obtained to evaluate the vascular anatomy. Carotid stenosis measurements (when applicable) are obtained utilizing NASCET criteria, using the distal internal carotid diameter as the denominator. CONTRAST:  ISOVUE-370 IOPAMIDOL (ISOVUE-370) INJECTION 76% COMPARISON:  Same-day MRI of the brain. FINDINGS: CTA NECK FINDINGS Aortic arch: Bovine variant branching. Imaged portion shows no evidence of aneurysm or dissection. No significant stenosis of the major arch vessel origins. Mild calcific atherosclerosis. Right carotid system: No evidence of dissection, stenosis (50% or greater) or occlusion. Left carotid system: No evidence of dissection, stenosis (50% or greater) or occlusion. Vertebral arteries: Left dominant. No evidence of dissection, stenosis (50% or greater) or occlusion. Skeleton: Negative. Other neck: Negative. Upper chest: 13 mm left cheek dermal cyst. Review of the MIP images confirms the above findings CTA HEAD FINDINGS Anterior circulation: No significant stenosis, proximal occlusion, aneurysm, or vascular malformation. Posterior circulation: Bilateral P2 short segments of occlusion/near occlusion with distal reconstitution (series 12, image 23 and 17 as well as series 8, image 103). Otherwise no large vessel occlusion,  aneurysm, or significant stenosis is identified. Venous sinuses: As permitted by contrast timing, patent. Anatomic variants: Anterior and bilateral posterior communicating arteries are present. Delayed  phase: No abnormal intracranial enhancement. Review of the MIP images confirms the above findings IMPRESSION: 1. Negative CTA of the neck. 2. Bilateral P2 short segments of occlusion/near occlusion with distal reconstitution. Given patient's age and symmetry consideration for vasculitis, reversible cerebral vasoconstriction syndrome, or vasospasm should be considered in addition to thromboembolic disease. 3. Otherwise negative CTA of the head. These results were called by telephone at the time of interpretation on 07/17/2017 at 6:19 pm to Dr. Erma HeritageIsaacs , who verbally acknowledged these results. Electronically Signed   By: Mitzi HansenLance  Furusawa-Stratton M.D.   On: 07/17/2017 18:31   Ct Head Wo Contrast  Result Date: 07/17/2017 CLINICAL DATA:  Right eye blurred vision. EXAM: CT HEAD WITHOUT CONTRAST TECHNIQUE: Contiguous axial images were obtained from the base of the skull through the vertex without intravenous contrast. COMPARISON:  None. FINDINGS: Brain: Low-density noted in the left occipital lobe compatible with infarction. No hemorrhage or hydrocephalus. No mass lesion. Vascular: No hyperdense vessel or unexpected calcification. Skull: No acute calvarial abnormality. Sinuses/Orbits: No acute finding. Other: None IMPRESSION: Low-density in the left occipital lobe compatible with acute to subacute infarct. Electronically Signed   By: Charlett NoseKevin  Dover M.D.   On: 07/17/2017 15:49   Ct Angio Neck W Or Wo Contrast  Result Date: 07/17/2017 CLINICAL DATA:  37 y/o  F; vision changes. EXAM: CT ANGIOGRAPHY HEAD AND NECK TECHNIQUE: Multidetector CT imaging of the head and neck was performed using the standard protocol during bolus administration of intravenous contrast. Multiplanar CT image reconstructions and MIPs were obtained to evaluate the vascular anatomy. Carotid stenosis measurements (when applicable) are obtained utilizing NASCET criteria, using the distal internal carotid diameter as the denominator. CONTRAST:  100mL  ISOVUE-370 IOPAMIDOL (ISOVUE-370) INJECTION 76% COMPARISON:  Same-day MRI of the brain. FINDINGS: CTA NECK FINDINGS Aortic arch: Bovine variant branching. Imaged portion shows no evidence of aneurysm or dissection. No significant stenosis of the major arch vessel origins. Mild calcific atherosclerosis. Right carotid system: No evidence of dissection, stenosis (50% or greater) or occlusion. Left carotid system: No evidence of dissection, stenosis (50% or greater) or occlusion. Vertebral arteries: Left dominant. No evidence of dissection, stenosis (50% or greater) or occlusion. Skeleton: Negative. Other neck: Negative. Upper chest: 13 mm left cheek dermal cyst. Review of the MIP images confirms the above findings CTA HEAD FINDINGS Anterior circulation: No significant stenosis, proximal occlusion, aneurysm, or vascular malformation. Posterior circulation: Bilateral P2 short segments of occlusion/near occlusion with distal reconstitution (series 12, image 23 and 17 as well as series 8, image 103). Otherwise no large vessel occlusion, aneurysm, or significant stenosis is identified. Venous sinuses: As permitted by contrast timing, patent. Anatomic variants: Anterior and bilateral posterior communicating arteries are present. Delayed phase: No abnormal intracranial enhancement. Review of the MIP images confirms the above findings IMPRESSION: 1. Negative CTA of the neck. 2. Bilateral P2 short segments of occlusion/near occlusion with distal reconstitution. Given patient's age and symmetry consideration for vasculitis, reversible cerebral vasoconstriction syndrome, or vasospasm should be considered in addition to thromboembolic disease. 3. Otherwise negative CTA of the head. These results were called by telephone at the time of interpretation on 07/17/2017 at 6:19 pm to Dr. Erma HeritageIsaacs , who verbally acknowledged these results. Electronically Signed   By: Mitzi HansenLance  Furusawa-Stratton M.D.   On: 07/17/2017 18:31   Mr Brain Wo  Contrast  Result  Date: 07/17/2017 CLINICAL DATA:  37 y/o F; right-sided eye blurriness and visual field changes. EXAM: MRI HEAD WITHOUT CONTRAST TECHNIQUE: Multiplanar, multiecho pulse sequences of the brain and surrounding structures were obtained without intravenous contrast. COMPARISON:  07/17/2016 CT head.  CT angiogram of head. FINDINGS: Brain: Multiple foci of reduced diffusion are present throughout the left PCA distribution involving left medial temporal lobe, occipital lobe, left splenium of corpus callosum, and left thalamus compatible with acute/early subacute infarction. Areas of infarction demonstrate T2 FLAIR hyperintense signal abnormality. No significant mass effect or hemorrhage. Fewnonspecific foci of T2 FLAIR hyperintense signal abnormality in subcortical and periventricular white matter are compatible withmildchronic microvascular ischemic changes for age. No extra-axial collection, hydrocephalus, or effacement of basilar cisterns. No herniation. Vascular: Refer to same-day CT angiogram of head. Skull and upper cervical spine: Normal marrow signal. Sinuses/Orbits: Moderate maxillary and anterior ethmoid sinus mucosal thickening. No abnormal signal of mastoid air cells. Orbits are unremarkable. Other: None. IMPRESSION: Several areas of acute/early subacute infarction within the left posterior cerebral artery distribution. No hemorrhage or mass effect. Electronically Signed   By: Mitzi Hansen M.D.   On: 07/17/2017 18:16    Microbiology: No results found for this or any previous visit (from the past 240 hour(s)).   Labs: Basic Metabolic Panel: Recent Labs  Lab 07/17/17 1638 07/17/17 1648 07/17/17 2134 07/18/17 0938  NA 138 141  --  137  K 3.2* 3.2*  --  3.6  CL 102 100*  --  101  CO2 28  --   --  26  GLUCOSE 92 92  --  101*  BUN 11 8  --  7  CREATININE 0.99 0.90 0.85 0.97  CALCIUM 9.8  --   --  9.8   Liver Function Tests: Recent Labs  Lab 07/17/17 1638  AST  18  ALT 16  ALKPHOS 41  BILITOT 0.8  PROT 8.2*  ALBUMIN 4.0   No results for input(s): LIPASE, AMYLASE in the last 168 hours. No results for input(s): AMMONIA in the last 168 hours. CBC: Recent Labs  Lab 07/17/17 1638 07/17/17 1648 07/17/17 2134  WBC 7.3  --  6.8  NEUTROABS 4.4  --   --   HGB 12.2 14.3 11.8*  HCT 38.3 42.0 37.1  MCV 75.8*  --  75.9*  PLT 240  --  236   Cardiac Enzymes: No results for input(s): CKTOTAL, CKMB, CKMBINDEX, TROPONINI in the last 168 hours. BNP: BNP (last 3 results) No results for input(s): BNP in the last 8760 hours.  ProBNP (last 3 results) No results for input(s): PROBNP in the last 8760 hours.  CBG: No results for input(s): GLUCAP in the last 168 hours.     Signed:  Edsel Petrin  [facility] 07/18/2017, 4:58 PM

## 2017-07-19 LAB — PROTEIN S ACTIVITY: PROTEIN S ACTIVITY: 93 % (ref 63–140)

## 2017-07-19 LAB — BETA-2-GLYCOPROTEIN I ABS, IGG/M/A
Beta-2-Glycoprotein I IgA: 20 GPI IgA units (ref 0–25)
Beta-2-Glycoprotein I IgM: 13 GPI IgM units (ref 0–32)

## 2017-07-19 LAB — PROTEIN C ACTIVITY: PROTEIN C ACTIVITY: 107 % (ref 73–180)

## 2017-07-19 LAB — PROTEIN S, TOTAL: PROTEIN S AG TOTAL: 89 % (ref 60–150)

## 2017-07-20 ENCOUNTER — Emergency Department (HOSPITAL_COMMUNITY): Payer: Self-pay

## 2017-07-20 ENCOUNTER — Encounter (HOSPITAL_COMMUNITY): Payer: Self-pay | Admitting: Emergency Medicine

## 2017-07-20 ENCOUNTER — Emergency Department (HOSPITAL_COMMUNITY)
Admission: EM | Admit: 2017-07-20 | Discharge: 2017-07-20 | Disposition: A | Discharge status: Home / Self Care | Payer: Self-pay | Attending: Emergency Medicine | Admitting: Emergency Medicine

## 2017-07-20 DIAGNOSIS — J45909 Unspecified asthma, uncomplicated: Secondary | ICD-10-CM | POA: Insufficient documentation

## 2017-07-20 DIAGNOSIS — F17210 Nicotine dependence, cigarettes, uncomplicated: Secondary | ICD-10-CM | POA: Insufficient documentation

## 2017-07-20 DIAGNOSIS — Z79899 Other long term (current) drug therapy: Secondary | ICD-10-CM | POA: Insufficient documentation

## 2017-07-20 DIAGNOSIS — I10 Essential (primary) hypertension: Secondary | ICD-10-CM | POA: Insufficient documentation

## 2017-07-20 DIAGNOSIS — R51 Headache: Secondary | ICD-10-CM | POA: Insufficient documentation

## 2017-07-20 DIAGNOSIS — R519 Headache, unspecified: Secondary | ICD-10-CM

## 2017-07-20 DIAGNOSIS — Z7982 Long term (current) use of aspirin: Secondary | ICD-10-CM | POA: Insufficient documentation

## 2017-07-20 LAB — PROTEIN C, TOTAL: Protein C, Total: 85 % (ref 60–150)

## 2017-07-20 LAB — URINE CULTURE: Special Requests: NORMAL

## 2017-07-20 NOTE — ED Notes (Signed)
Patient able to ambulate independently  

## 2017-07-20 NOTE — ED Notes (Signed)
Patient at CT

## 2017-07-20 NOTE — ED Provider Notes (Signed)
[facility] EMERGENCY DEPARTMENT Provider Note   CSN: [id number] Arrival date & time: 07/20/17  1101     History   Chief Complaint Chief Complaint  Patient presents with  . Headache    HPI Suzanne Graham is a 37 y.o. female.  37 year old female with history of hypertension, asthma, depression and CVA presents following recent admission and diagnosis of CVA.  Patient was just discharged from this facility 2 days prior.  During that admission she was diagnosed with a CVA.  Her presenting symptoms for that admission were mild headache and blurry vision.  She presents today complaining that her headache and blurry vision have not improved.  She denies new symptoms.  She is concerned that her symptoms that were suggestive of the initial diagnosis have failed to improve.  She reports that she is compliant with taking a daily aspirin.  She has yet to establish the follow-up visits as instructed at the time of her recent discharge.  MRI results from recent admission demonstrated several areas of acute/early subacute infarctions of the left posterior cerebral artery distribution.   The history is provided by the patient and medical records.  Headache   This is a recurrent problem. The current episode started more than 1 week ago. The problem occurs constantly. The problem has not changed since onset.The headache is associated with nothing. The quality of the pain is described as dull. The pain does not radiate.    Past Medical History:  Diagnosis Date  . Asthma   . Depression   . Gout   . Hypertension     Patient Active Problem List   Diagnosis Date Noted  . Stroke [facility]) 07/17/2017    History reviewed. No pertinent surgical history.  OB History    No data available       Home Medications    Prior to Admission medications   Medication Sig Start Date End Date Taking? Authorizing Provider  albuterol (PROVENTIL HFA;VENTOLIN HFA) 108 (90 Base) MCG/ACT inhaler  Inhale 2 puffs into the lungs every 4 (four) hours as needed for wheezing or shortness of breath.   Yes [provider]  aspirin 325 MG tablet Take 1 tablet (325 mg total) by mouth daily. 07/19/17  Yes Mikhail, Nita Sells, DO  lisinopril-hydrochlorothiazide (ZESTORETIC) 20-12.5 MG tablet Take 1 tablet by mouth daily. 07/14/17 08/13/17 Yes Mathews Robinsons B, PA-C  atorvastatin (LIPITOR) 20 MG tablet Take 1 tablet (20 mg total) by mouth daily at 6 PM. Patient not taking: Reported on 07/20/2017 07/18/17   Edsel Petrin, DO  nicotine (NICODERM CQ - DOSED IN MG/24 HOURS) 14 mg/24hr patch Place 1 patch (14 mg total) onto the skin daily as needed (nicotine craving). Patient not taking: Reported on 07/20/2017 07/18/17   Edsel Petrin, DO    Family History Family History  Problem Relation Age of Onset  . Heart failure Mother   . Hypertension Mother   . Stroke Neg Hx     Social History Social History   Tobacco Use  . Smoking status: Current Every Day Smoker    Packs/day: 0.50    Types: Cigarettes  . Smokeless tobacco: Never Used  Substance Use Topics  . Alcohol use: Yes    Comment: occasinally  . Drug use: No     Allergies   Patient has no known allergies.   Review of Systems Review of Systems  Neurological: Positive for headaches.  All other systems reviewed and are negative.    Physical Exam Updated Vital  Signs BP 129/78 (BP Location: Left Arm)   Pulse 83   Temp 99.7 F (37.6 C) (Oral)   Resp 16   LMP 07/14/2017   SpO2 97%   Physical Exam  Constitutional: She is oriented to person, place, and time. She appears well-developed and well-nourished. No distress.  HENT:  Head: Normocephalic and atraumatic.  Mouth/Throat: Oropharynx is clear and moist.  Eyes: Conjunctivae and EOM are normal. Pupils are equal, round, and reactive to light.  Neck: Normal range of motion. Neck supple.  Cardiovascular: Normal rate, regular rhythm and normal heart sounds.    Pulmonary/Chest: Effort normal and breath sounds normal. No respiratory distress.  Abdominal: Soft. She exhibits no distension. There is no tenderness.  Musculoskeletal: Normal range of motion. She exhibits no edema or deformity.  Neurological: She is alert and oriented to person, place, and time.  Skin: Skin is warm and dry.  Psychiatric: She has a normal mood and affect.  Nursing note and vitals reviewed.    ED Treatments / Results  Labs (all labs ordered are listed, but only abnormal results are displayed) Labs Reviewed - No data to display  EKG  EKG Interpretation None       Radiology No results found.  Procedures Procedures (including critical care time)  Medications Ordered in ED Medications - No data to display   Initial Impression / Assessment and Plan / ED Course  I have reviewed the triage vital signs and the nursing notes.  Pertinent labs & imaging results that were available during my care of the patient were reviewed by me and considered in my medical decision making (see chart for details).     MDM screen complete  Patient is presenting for reassurance following recent CVA.  Patient is without new acute symptoms.  CT imaging of the brain does not reveal new pathology.  This case was discussed with Dr. Amada JupiterKirkpatrick of Neurology.  He agrees that the patient does not require further workup at this time.  Patient understands that her symptoms, unfortunately, may be an ongoing issue.  Close follow-up with neuro is stressed --patient already understands that she has to make appointments with neurology tomorrow.  Strict return precautions given and understood.  Final Clinical Impressions(s) / ED Diagnoses   Final diagnoses:  Nonintractable headache, unspecified chronicity pattern, unspecified headache type    ED Discharge Orders    None       Wynetta FinesMessick, Peter C, MD 07/20/17 1338

## 2017-07-20 NOTE — ED Notes (Signed)
EDP at bedside, updating patient for d/c.

## 2017-07-20 NOTE — ED Triage Notes (Signed)
Pt presents to ED after being released last week for stroke.  Pt c/o headache has never gotten better, and "my head feels weak all the time".  Denies any new weakness.  Normotensive in room.  Pt's family member states memory issues since.  Pt states she is still smoking.

## 2017-07-20 NOTE — ED Notes (Signed)
EDP at bedside  

## 2017-07-21 ENCOUNTER — Emergency Department (HOSPITAL_COMMUNITY)
Admission: EM | Admit: 2017-07-21 | Discharge: 2017-07-21 | Disposition: A | Discharge status: Home / Self Care | Payer: Self-pay | Attending: Emergency Medicine | Admitting: Emergency Medicine

## 2017-07-21 ENCOUNTER — Encounter (HOSPITAL_COMMUNITY): Payer: Self-pay

## 2017-07-21 ENCOUNTER — Other Ambulatory Visit: Payer: Self-pay

## 2017-07-21 DIAGNOSIS — Z79899 Other long term (current) drug therapy: Secondary | ICD-10-CM | POA: Insufficient documentation

## 2017-07-21 DIAGNOSIS — R519 Headache, unspecified: Secondary | ICD-10-CM

## 2017-07-21 DIAGNOSIS — R51 Headache: Secondary | ICD-10-CM | POA: Insufficient documentation

## 2017-07-21 DIAGNOSIS — Z7982 Long term (current) use of aspirin: Secondary | ICD-10-CM | POA: Insufficient documentation

## 2017-07-21 DIAGNOSIS — I10 Essential (primary) hypertension: Secondary | ICD-10-CM | POA: Insufficient documentation

## 2017-07-21 DIAGNOSIS — J45909 Unspecified asthma, uncomplicated: Secondary | ICD-10-CM | POA: Insufficient documentation

## 2017-07-21 DIAGNOSIS — F17210 Nicotine dependence, cigarettes, uncomplicated: Secondary | ICD-10-CM | POA: Insufficient documentation

## 2017-07-21 LAB — ENA+DNA/DS+ANTICH+CENTRO+JO...
Centromere Ab Screen: <0.2 AI (ref 0.0–0.9)
Chromatin Ab SerPl-aCnc: 0.2 AI (ref 0.0–0.9)
SSA (RO) (ENA) ANTIBODY, IGG: 1.5 AI — AB (ref 0.0–0.9)
SSB (La) (ENA) Antibody, IgG: <0.2 AI (ref 0.0–0.9)
ds DNA Ab: 1 IU/mL (ref 0–9)

## 2017-07-21 LAB — PROTHROMBIN GENE MUTATION

## 2017-07-21 LAB — CARDIOLIPIN ANTIBODIES, IGG, IGM, IGA: ANTICARDIOLIPIN IGM: 12 [MPL'U]/mL (ref 0–12)

## 2017-07-21 LAB — LUPUS ANTICOAGULANT PANEL
DRVVT: 51.3 s — ABNORMAL HIGH (ref 0.0–47.0)
PTT Lupus Anticoagulant: 38.1 s (ref 0.0–51.9)

## 2017-07-21 LAB — HOMOCYSTEINE: HOMOCYSTEINE-NORM: 17.8 umol/L — AB (ref 0.0–15.0)

## 2017-07-21 LAB — ANA W/REFLEX IF POSITIVE: ANA: POSITIVE — AB

## 2017-07-21 LAB — DRVVT MIX: DRVVT MIX: 43.9 s (ref 0.0–47.0)

## 2017-07-21 MED ORDER — ACETAMINOPHEN 500 MG PO TABS
1000.0000 mg | ORAL_TABLET | Freq: Once | ORAL | Status: AC
  Administered 2017-07-21: 1000 mg via ORAL
  Filled 2017-07-21: qty 2

## 2017-07-21 NOTE — Care Management (Signed)
07/21/2017 at 10:17 am: CM was able to obtain her an appointment [facility]. CM called Suzanne Graham and provided her the information. CM also texted her the information per her request.  Pt states she hasnt started her cholesterol medication. CM inquired about why she had not. Pt discussed the cost. CM reminded her that CM had provided her a discount card. Pt states she now remembers and will find the card and go and pick up the medication.

## 2017-07-21 NOTE — Discharge Instructions (Signed)
Take [phone] mg of Tylenol every 6 hours as needed for headache.  Do not exceed 4000 mg of Tylenol daily.  Drink plenty of fluids and get plenty of rest.  Follow-up with your neurologist in 1 week as scheduled.  Return to the ED if any concerning signs or symptoms develop such as change in the severity or quality of your headache, passing out, or fever.

## 2017-07-21 NOTE — ED Triage Notes (Signed)
PT arrives VIA EMS .with complaints of headache and blurred vision x 4 day. Pt reports she was seen for same several days ago and told she had mild stroke and has been back multiple times since for migraine cocktail. Neuro neg.

## 2017-07-21 NOTE — ED Provider Notes (Signed)
[facility] EMERGENCY DEPARTMENT Provider Note   CSN: [id number] Arrival date & time: 07/21/17  1318     History   Chief Complaint No chief complaint on file.   HPI Suzanne Graham is a 37 y.o. female with history of CVA, asthma, hypertension presents with chief complaint gradual onset, intermittent headaches.  She was seen and evaluated for these headaches on a 07/17/17 admitted for further workup and diagnosed with CVA, discharged to the following day.  MRI results from that time demonstrated several areas of acute/early subacute infarctions of the left posterior cerebral artery distribution.  She was seen and evaluated yesterday for the same complaint and was found to be stable for discharge.  She states that headaches are either left-sided or right-sided, sharp in nature and do not radiate.  The will last for several minutes.  Before resolving.  Associated with photophobia and intermittent right eye vision changes.  States she has been compliant with her blood pressure medication and.  Has tried ibuprofen for her headaches at home without significant relief.  Denies nausea, vomiting, recent  trauma, numbness, tingling, weakness, chest pain, shortness of breath, or any other symptoms.  She was instructed to call her neurologist to set up a follow-up appointment and states that her appointment is next week. States most recent headache began 5 minutes prior to my assessment and is left sided.   The history is provided by the patient.    Past Medical History:  Diagnosis Date  . Asthma   . Depression   . Gout   . Hypertension     Patient Active Problem List   Diagnosis Date Noted  . Stroke [facility]) 07/17/2017    History reviewed. No pertinent surgical history.  OB History    No data available       Home Medications    Prior to Admission medications   Medication Sig Start Date End Date Taking? Authorizing Provider  albuterol (PROVENTIL HFA;VENTOLIN HFA) 108  (90 Base) MCG/ACT inhaler Inhale 2 puffs into the lungs every 4 (four) hours as needed for wheezing or shortness of breath.    [provider]  aspirin 325 MG tablet Take 1 tablet (325 mg total) by mouth daily. 07/19/17   Mikhail, Nita Sells, DO  atorvastatin (LIPITOR) 20 MG tablet Take 1 tablet (20 mg total) by mouth daily at 6 PM. Patient not taking: Reported on 07/20/2017 07/18/17   Edsel Petrin, DO  lisinopril-hydrochlorothiazide (ZESTORETIC) 20-12.5 MG tablet Take 1 tablet by mouth daily. 07/14/17 08/13/17  Mathews Robinsons B, PA-C  nicotine (NICODERM CQ - DOSED IN MG/24 HOURS) 14 mg/24hr patch Place 1 patch (14 mg total) onto the skin daily as needed (nicotine craving). Patient not taking: Reported on 07/20/2017 07/18/17   Edsel Petrin, DO    Family History Family History  Problem Relation Age of Onset  . Heart failure Mother   . Hypertension Mother   . Stroke Neg Hx     Social History Social History   Tobacco Use  . Smoking status: Current Every Day Smoker    Packs/day: 0.50    Types: Cigarettes  . Smokeless tobacco: Never Used  Substance Use Topics  . Alcohol use: Yes    Comment: occasinally  . Drug use: No     Allergies   Patient has no known allergies.   Review of Systems Review of Systems  Constitutional: Negative for chills and fever.  Eyes: Positive for photophobia and visual disturbance.  Neurological: Positive for headaches. Negative  for syncope, weakness, light-headedness and numbness.  All other systems reviewed and are negative.    Physical Exam Updated Vital Signs BP 136/88 (BP Location: Left Arm)   Pulse 75   Temp 98 F (36.7 C) (Oral)   Resp 16   LMP 07/14/2017   SpO2 99%   Physical Exam  Constitutional: She is oriented to person, place, and time. She appears well-developed and well-nourished. No distress.  HENT:  Head: Normocephalic and atraumatic.  Right Ear: External ear normal.  Left Ear: External ear normal.  No tenderness  to palpation of the face or skull including the temples  Eyes: Conjunctivae and EOM are normal. Pupils are equal, round, and reactive to light. Right eye exhibits no discharge. Left eye exhibits no discharge.  No pain with EOMs.   Neck: Normal range of motion. Neck supple. No JVD present. No tracheal deviation present.  Cardiovascular: Normal rate, regular rhythm, normal heart sounds and intact distal pulses.  Pulmonary/Chest: Effort normal and breath sounds normal. No respiratory distress.  Abdominal: Soft. Bowel sounds are normal. She exhibits no distension. There is no tenderness.  Musculoskeletal: Normal range of motion. She exhibits no edema or tenderness.  5/5 strength of BUE and BLE major muscle groups  Neurological: She is alert and oriented to person, place, and time. No cranial nerve deficit or sensory deficit. She exhibits normal muscle tone.  Mental Status:  Alert, thought content appropriate, able to give a coherent history. Speech fluent without evidence of aphasia. Able to follow 2 step commands without difficulty.  Cranial Nerves:  II:  pupils equal, round, reactive to light III,IV, VI: ptosis not present, extra-ocular motions intact bilaterally  V,VII: smile symmetric, facial light touch sensation equal VIII: hearing grossly normal to voice  X: uvula elevates symmetrically  XI: bilateral shoulder shrug symmetric and strong XII: midline tongue extension without fassiculations Motor:  Normal tone. 5/5 strength of BUE and BLE major muscle groups including strong and equal grip strength and dorsiflexion/plantar flexion Sensory: light touch normal in all extremities. DTRs: biceps and achilles 2+ symmetric b/l Cerebellar: normal finger-to-nose with bilateral upper extremities Gait: normal gait and balance. Able to walk on toes and heels with ease.  CV: 2+ radial and DP/PT pulses   Skin: Skin is warm and dry. No erythema.  Psychiatric: She has a normal mood and affect. Her  behavior is normal.  Nursing note and vitals reviewed.    ED Treatments / Results  Labs (all labs ordered are listed, but only abnormal results are displayed) Labs Reviewed - No data to display  EKG  EKG Interpretation None       Radiology Ct Head Wo Contrast  Result Date: 07/20/2017 CLINICAL DATA:  Headache for the past 2 days. History of hypertension. EXAM: CT HEAD WITHOUT CONTRAST TECHNIQUE: Contiguous axial images were obtained from the base of the skull through the vertex without intravenous contrast. COMPARISON:  Head CT dated 07/17/2017 and brain MR dated 07/17/2017. FINDINGS: Brain: Better defined previously demonstrated left occipital lobe infarct. There is also better defined infarct in the posterior left temporal lobe. No areas of acute infarction visible. Normal size and position of the ventricles. No intracranial hemorrhage mass. Vascular: No hyperdense vessel or unexpected calcification. Skull: Normal. Negative for fracture or focal lesion. Sinuses/Orbits: Mild left maxillary and bilateral ethmoid sinus mucosal thickening. Minimal right sphenoid sinus mucosal thickening. Unremarkable orbits. Other: None. IMPRESSION: 1. Better defined previously demonstrated infarcts. 2. No new areas of infarction visible by CT. 3. No  intracranial hemorrhage. 4. Mild chronic sinusitis. Electronically Signed   By: Beckie Salts M.D.   On: 07/20/2017 12:08    Procedures Procedures (including critical care time)  Medications Ordered in ED Medications  acetaminophen (TYLENOL) tablet 1,000 mg (not administered)     Initial Impression / Assessment and Plan / ED Course  I have reviewed the triage vital signs and the nursing notes.  Pertinent labs & imaging results that were available during my care of the patient were reviewed by me and considered in my medical decision making (see chart for details).    Patient presents for reevaluation of recurrent headache.  She states no change in  quality or severity of headache.  Afebrile, vital signs are stable.  No focal neurological deficits on examination.  She has follow-up scheduled with a neurologist in 1 week which I encouraged the patient to keep.  Discussed the risks and benefits of a migraine cocktail, patient states she feels comfortable for discharge with just Tylenol for pain control.  I think this is reasonable at this time.  Patient is well-appearing and in no apparent distress.  Ambulatory without difficulty.  No fever or meningeal signs to suggest meningitis.  No red flag signs concerning for [facility] or intracranial hemorrhage.  She will follow-up with her neurologist as scheduled.  She will continue taking her blood pressure medicine and aspirin and will try Tylenol as needed for headache.  She understands that she will likely continue to have recurrent headache.  Discussed indications for return to the ED. Pt verbalized understanding of and agreement with plan and is safe for discharge home at this time.  No complaints prior to discharge.   Final Clinical Impressions(s) / ED Diagnoses   Final diagnoses:  Intermittent headache    ED Discharge Orders    None       Jeanie Sewer, PA-C 07/21/17 1647    Arby Barrette, MD 07/31/17 1310

## 2017-07-23 ENCOUNTER — Telehealth: Payer: Self-pay | Admitting: Pharmacist

## 2017-07-23 ENCOUNTER — Encounter: Payer: Self-pay | Admitting: Pharmacist

## 2017-07-23 ENCOUNTER — Other Ambulatory Visit: Payer: Self-pay

## 2017-07-23 LAB — FACTOR 5 LEIDEN

## 2017-07-23 NOTE — Telephone Encounter (Signed)
-----   Message from Sharalyn InkLavelda M Comer sent at 07/23/2017 10:36 AM EST ----- Regarding: Referral: Order for Leonidas RombergBROWN,Kinzey Nakeshia Waldeck  Referral from Antionette Fairyoshanda Florance, RN  "Please see below request"  Elza RafterLavelda ----- Message ----- From: Charlyn MinervaFlorance, Roshanda Jeanette, RN Sent: 07/23/2017   9:40 AM To: [facility] Cm Communication Orders Subject: Order for Graham,Suzanne                        Patient Name: 16[name],Suzanne([id number]) Sex: Female DOB: 1981/04/22    PCP: NO PCP PER PATIENT   Center: None   Types of orders made on 07/23/2017: Nursing  Order Date:07/23/2017 Ordering User:FLORANCE, Jolene SchimkeROSHANDA J [[id number]][[id number]] Encounter Provider:Florance, Adrienne Mochaoshanda Jeanette, RN [[id number]] Authorizing Provi der: Default, Conversion, NP [[id number]] Department:[facility][[id number]]  Order Specific Information Order: Comm to Pharmacy [Custom: [id number]]  Order #: [id number] Qty: 1   Priority: STAT  Class: Clinic Performed   Comment:[facility] Pharmacy Referral            Referral from: EMMI-STROKE-patient is not on APL            Patient recently discharged from hospital on 07/18/17 following a             st roke.            She is unable to locate her discount card for Lipitor given to her             by inpatient CM.Patient uninsured and unable to afford med. She is             high risk for readmission.     Priority: STAT  Class: Clinic Performed   Comment:[facility] Pharmacy Referral            Referral from: EMMI-STROKE-patient is not on APL            Patient recently discharged from hospital on 07/18/17 following a              stroke.            She is unable to locate her discount card for Lipitor given to her             by inpatient CM.Patient uninsured and unable to afford med. She is             high risk for readmission.

## 2017-07-23 NOTE — Patient Outreach (Signed)
[facility]) Care Management  07/23/2017  Suzanne BentonShawnell Graham 06/24/1981 [id number]     EMMI-STROKE RED ON EMMI ALERT Day # 1 Date: 07/22/17 Red Alert Reason: " Questions/problems with meds? Yes  Been able to take every dose of meds? No  Problems setting up rehab? Yes"   Outreach attempt # 1 to patient.  Spoke with patient. Reviewed and addressed red alerts. Initially, patient reported she did not have any med issus then she called out her meds to RN CM and did not mention Lipitor. Per CM notes patient was given discount card for med. She voices she can not find the card and is asking for a replacement. She is currently uninsured with Medicaid pending. RN CM reviewed with patient appts. [facility] to establish care with PCP(Kimberly Harris). Provided patient with neuro office contact info to call and schedule e appt as she voices she does not know where her discharge papers are currently. RN CM encouraged patient to make f/u as soon as possible. She voiced understanding.  Her mother will be transporting her to appts. [facility].        Plan: [facility] with [facility].  RN CM will f/u with patient within a week to check on her condition and status.    Antionette Fairyoshanda Burnett Spray, RN,BSN,CCM [facility] Care Management Telephonic Care Management Coordinator Direct Phone: [phone] Toll Free: [phone] Fax: [phone]

## 2017-07-24 ENCOUNTER — Telehealth: Payer: Self-pay | Admitting: Neurology

## 2017-07-24 NOTE — Patient Outreach (Addendum)
[facility]) Care Management  [facility]   07/24/2017  Suzanne BentonShawnell Graham 08/30/1980 [id number]  Subjective: Patient was called per referral for medication assistance. HIPAA identifiers were obtained. Patient is a 37 year old female with hypertension, gout, depression and asthma. She was recently hospitalized for a CVA 05/18/18 and subsequently visited the ED 07/20/17 and 07/21/17 for headaches.  Patient reported managing her medications on her own. She reported that she had not picked up her atorvastatin or nicotine patches from [facility].  When the original referral was sent, it stated the patient was given a copay card for Lipitor but lost it.  Patient confirmed she misplaced the card and had no idea how much her prescriptions would cost [facility] at [facility].  Objective:   Encounter Medications: Outpatient Encounter Medications as of 07/23/2017  Medication Sig  . albuterol (PROVENTIL HFA;VENTOLIN HFA) 108 (90 Base) MCG/ACT inhaler Inhale 2 puffs into the lungs every 4 (four) hours as needed for wheezing or shortness of breath.  . lisinopril-hydrochlorothiazide (ZESTORETIC) 20-12.5 MG tablet Take 1 tablet by mouth daily.  Marland Kitchen. aspirin 325 MG tablet Take 1 tablet (325 mg total) by mouth daily.  Marland Kitchen. atorvastatin (LIPITOR) 20 MG tablet Take 1 tablet (20 mg total) by mouth daily at 6 PM. (Patient not taking: Reported on 07/20/2017)  . nicotine (NICODERM CQ - DOSED IN MG/24 HOURS) 14 mg/24hr patch Place 1 patch (14 mg total) onto the skin daily as needed (nicotine craving). (Patient not taking: Reported on 07/20/2017)   No facility-administered encounter medications on file as of 07/23/2017.     Functional Status: In your present state of health, do you have any difficulty performing the following activities: 07/18/2017  Hearing? N  Vision? N  Difficulty concentrating or making decisions? N  Walking or climbing stairs? Y  Dressing or bathing? N  Doing errands, shopping? N      Assessment: ASSESSMENT: Date Discharged from Hospital: 07/18/17 Date Medication Reconciliation Performed: 07/23/17  Medications Discontinued at Discharge:   Excedrin Migraine  Prednisone 10mg   Meloxicam 15mg   Ibuprofen  New Medications at Discharge: (delete if applicable)  Nicotine patches  Atorvastatin   Drugs sorted by system:  Cardiovascular: ASA Lisinopril/HCTZ Atorvastatin   Pulmonary/Allergy: ProAir HFA  Miscellaneous: Nicotine Patches  Medication Assistance Findings:  -patient does not have insurance that pays for her medications -the copay card she was given at discharge can only be used with a patient's insurance as an adjunct to decrease the cost of the medication to $4 -the [facility]vastatin [facility] and had a copay of $17.55 (GoodRx price) -[facility]'s price for the same prescription was $9 but the patient said she would rather just [facility]. -patient said she would go and pick up the prescription or have her mother pick it up for her. -the Nicotine patches were $49 -it was suggested to the patient that she may want move her profile to [facility], [facility] or [facility] as a way to decrease her cost.  Plan: Close Pharmacy Case as patient cannot use the card she was referred for but will go and pick up her prescription for [facility] from [facility].  Beecher McardleKatina J. Raydin Bielinski, PharmD, [facility] SurgeryBCACP [facility] Clinical Pharmacist [phone]

## 2017-07-24 NOTE — Telephone Encounter (Signed)
Physical Therapist Amber from [facility] has called stating she evaluated pt on 07-23-2017 her orders are for balance and navigating since pt has had a stroke and a change in her vision.  Amber is requesting 1 time a week for 4 weeks , she has indicated pt's greater need will be speech therapy

## 2017-07-24 NOTE — Telephone Encounter (Signed)
Rn Engineer, drillingcall Amber at [facility] at [phone]. Rn stated per Dr. Roda ShuttersXu pt can have physical therapy one time weekly for 4 weeks. Also Rn stated she had vision problems in the hospital. Rn recommend Amber tell pt to get eye exam before she sees Dr. Roda ShuttersXu.

## 2017-07-28 ENCOUNTER — Other Ambulatory Visit: Payer: Self-pay

## 2017-07-28 NOTE — Patient Outreach (Signed)
[facility]) Care Management  07/28/2017  Colin BentonShawnell Deihl 04/05/1981 [id number]     EMMI-STROKE RED ON EMMI ALERT Day # 6 Date: 07/27/17 Red Alert Reason: "feeling worse overall? Yes   New problems walking/talking/speaking and seeing? Yes   Outreach attempt # 1 to patient. Spoke with patient. Reviewed and addressed red alerts.Patient voices that she is not feeling worse but admits to still feeling the same. She continues to have some numbness/tingling on affected side, dizziness and blurred vision. She goes to PCP appt on tomorrow. Reviewed with patient PCP info. She voices that her mother will be taking her to her appt. Patient reports she still has not picked up her Zocor and Nicotine patches even [facility]. RN CM strongly encouraged patient to pick up meds and begin taking. No further needs or concerns at this time.      Plan: RN CM will follow with patient within a week.    Antionette Fairyoshanda Jaycen Vercher, RN,BSN,CCM [facility] Care Management Telephonic Care Management Coordinator Direct Phone: [phone] Toll Free: [phone] Fax: [phone]

## 2017-07-29 ENCOUNTER — Encounter: Payer: Self-pay | Admitting: Family Medicine

## 2017-07-29 ENCOUNTER — Ambulatory Visit (INDEPENDENT_AMBULATORY_CARE_PROVIDER_SITE_OTHER): Payer: Self-pay | Admitting: Family Medicine

## 2017-07-29 VITALS — BP 136/80 | HR 70 | Temp 97.9°F | Resp 16 | Ht 64.0 in | Wt 225.0 lb

## 2017-07-29 DIAGNOSIS — I639 Cerebral infarction, unspecified: Secondary | ICD-10-CM

## 2017-07-29 DIAGNOSIS — G8929 Other chronic pain: Secondary | ICD-10-CM

## 2017-07-29 DIAGNOSIS — R51 Headache: Secondary | ICD-10-CM

## 2017-07-29 DIAGNOSIS — I10 Essential (primary) hypertension: Secondary | ICD-10-CM

## 2017-07-29 LAB — POCT URINALYSIS DIP (DEVICE)
BILIRUBIN URINE: NEGATIVE
GLUCOSE, UA: NEGATIVE mg/dL
KETONES UR: NEGATIVE mg/dL
Nitrite: NEGATIVE
PH: 5.5 (ref 5.0–8.0)
Protein, ur: NEGATIVE mg/dL
Specific Gravity, Urine: 1.02 (ref 1.005–1.030)
Urobilinogen, UA: 0.2 mg/dL (ref 0.0–1.0)

## 2017-07-29 MED ORDER — ATORVASTATIN CALCIUM 20 MG PO TABS: 20.0000 mg | ORAL_TABLET | Freq: Every day | ORAL | 0 refills | Status: DC

## 2017-07-29 NOTE — Progress Notes (Signed)
Patient ID: Roslind Michaux, female    DOB: 1980-12-30, 37 y.o.   MRN: [id number]  PCP: Bing Neighbors, FNP  Chief Complaint  Patient presents with  . Establish Care  . Hospitalization Follow-up    stroke, headaches    Subjective:  HPI Adelene Polivka is a 37 y.o. female presents to establish care and hospital follow-up. Medical history significant only for CVA, asthma, current daily smoker, gout, chronic headaches, and hypertension.  On 07/17/17, patient was admitted to [facility] with a complaint of a visual disturbance of the right eye which MRI confirmed was a stroke. She admitted to being off of previously prescribed antihypertension medications for several years due to lack of health insurance. Today, complains of visual deficits bilateral eyes. Difficulty seeing thing far away. She continues to experience significant headaches which are mostly localized to the left temporal region. Comes and go. Since resuming blood pressure medications headache symptoms have improved.  She is accompanied today by her mother that reports patient is experiencing loss of memory mainly short-term and gait/balance instability. Patient agrees with short-term memory deficit although denies any problems with her gait. Currently receiving home health and PT services. She is actively attempting to quit smoking. She denies chest pain, dizziness, shortness of breath, changes in mood, or new weakness. Social History   Socioeconomic History  . Marital status: Single    Spouse name: Not on file  . Number of children: Not on file  . Years of education: Not on file  . Highest education level: Not on file  Social Needs  . Financial resource strain: Not on file  . Food insecurity - worry: Not on file  . Food insecurity - inability: Not on file  . Transportation needs - medical: Not on file  . Transportation needs - non-medical: Not on file  Occupational History  . Not on file  Tobacco Use  . Smoking  status: Current Every Day Smoker    Packs/day: 0.50    Types: Cigarettes  . Smokeless tobacco: Never Used  Substance and Sexual Activity  . Alcohol use: Yes    Comment: occasinally  . Drug use: No  . Sexual activity: Not on file  Other Topics Concern  . Not on file  Social History Narrative  . Not on file    Family History  Problem Relation Age of Onset  . Heart failure Mother   . Hypertension Mother   . Stroke Neg Hx      Review of Systems  Constitutional: Positive for fatigue.  Eyes: Positive for visual disturbance.  Respiratory: Negative.   Cardiovascular: Negative.   Genitourinary: Negative.   Musculoskeletal: Negative.   Neurological: Positive for headaches.  Psychiatric/Behavioral: Negative.     Patient Active Problem List   Diagnosis Date Noted  . Stroke (HCC) 07/17/2017    No Known Allergies  Prior to Admission medications   Medication Sig Start Date End Date Taking? Authorizing Provider  albuterol (PROVENTIL HFA;VENTOLIN HFA) 108 (90 Base) MCG/ACT inhaler Inhale 2 puffs into the lungs every 4 (four) hours as needed for wheezing or shortness of breath.    [provider]  aspirin 325 MG tablet Take 1 tablet (325 mg total) by mouth daily. 07/19/17   Mikhail, Nita Sells, DO  atorvastatin (LIPITOR) 20 MG tablet Take 1 tablet (20 mg total) by mouth daily at 6 PM. Patient not taking: Reported on 07/20/2017 07/18/17   Edsel Petrin, DO  lisinopril-hydrochlorothiazide (ZESTORETIC) 20-12.5 MG tablet Take 1 tablet by  mouth daily. 07/14/17 08/13/17  Mathews RobinsonsMitchell, Jessica B, PA-C  nicotine (NICODERM CQ - DOSED IN MG/24 HOURS) 14 mg/24hr patch Place 1 patch (14 mg total) onto the skin daily as needed (nicotine craving). Patient not taking: Reported on 07/20/2017 07/18/17   Edsel PetrinMikhail, Maryann, DO    Past Medical, Surgical Family and Social History reviewed and updated.    Objective:   Today's Vitals   07/29/17 1037  BP: 136/80  Pulse: 70  Resp: 16  Temp: 97.9 F  (36.6 C)  TempSrc: Oral  SpO2: 99%  Weight: 225 lb (102.1 kg)  Height: 5\' 4"  (1.626 m)    Wt Readings from Last 3 Encounters:  07/18/17 232 lb 2.3 oz (105.3 kg)  07/14/17 240 lb (108.9 kg)    Physical Exam Constitutional: Patient appears well-developed and well-nourished. No distress. HENT: Normocephalic, atraumatic, External right and left ear normal. Oropharynx is clear and moist.  Eyes: Conjunctivae and EOM are normal. PERRLA, no scleral icterus, Impaired distant vision. Neck: Normal ROM. Neck supple. No JVD. No tracheal deviation. No thyromegaly. CVS: RRR, S1/S2 +, no murmurs, no gallops, no carotid bruit.  Pulmonary: Effort and breath sounds normal, no stridor, rhonchi, wheezes, rales.  Abdominal: Soft. BS +, no distension, tenderness, rebound or guarding.  Musculoskeletal: Normal range of motion. No edema and no tenderness.  Lymphadenopathy: Negative of lymphadenopathy-cervical  Neuro: Alert. Normal reflexes, muscle tone coordination. No cranial nerve deficit. Skin: Skin is warm and dry. No rash noted. Not diaphoretic. No erythema. No pallor. Psychiatric: Normal mood and affect. Behavior, judgment, thought content normal.   Assessment & Plan:  1. Cerebrovascular accident (CVA), unspecified mechanism (HCC), visual deficit present. Neurological exam today, remarkably intact. Recommend continue PT and follow-up with the Stroke Clinic as scheduled for March.   2. Essential hypertension, controlled today. We have discussed target BP range and blood pressure goal. I have advised patient to check BP regularly and to call us back or report to clinic if the numbers are consistently higher than 140/90. We discussed the importance of compliance with medical therapy and DASH diet recommended, consequences of uncontrolled hypertension discussed. Continue current BP medications   3. Chronic nonintractable headache, unspecified headache type, headaches have improved, although continue to  occur. HA pain is responsive to acetaminophen. Recommended increasing dose of  acetaminophen 500 mg every 8 hours as needed with small quantity of caffeine beverage.    Meds ordered this encounter  Medications  . atorvastatin (LIPITOR) 20 MG tablet    Sig: Take 1 tablet (20 mg total) by mouth daily at 6 PM.    Dispense:  90 tablet    Refill:  0    Order Specific Question:   Supervising Provider    Answer:   Quentin AngstJEGEDE, OLUGBEMIGA E [id number]]  . lisinopril-hydrochlorothiazide (ZESTORETIC) 20-12.5 MG tablet    Sig: Take 1 tablet by mouth daily.    Dispense:  90 tablet    Refill:  0    Order Specific Question:   Supervising Provider    Answer:   Quentin AngstJEGEDE, OLUGBEMIGA E [id number]]    Orders Placed This Encounter  Procedures  . Comprehensive metabolic panel  . POCT urinalysis dip (device)   RTC: 6 weeks for follow-up of BP and headaches  Provided [place] Patient Assistance Application. Please complete and return the application to the  address list on the application.      Godfrey PickKimberly S. Tiburcio PeaHarris, MSN, FNP-C The Patient Care [facility]  [address] [place], [place]  [address] [phone]    

## 2017-07-29 NOTE — Patient Instructions (Addendum)
For now, treat headache with Tylenol 500 mg every 8 hours as needed. Take tylenol with a small amount of caffeine beverage. If headaches worsen or increase in intensity, return for follow-up sooner.     DASH Eating Plan DASH stands for "Dietary Approaches to Stop Hypertension." The DASH eating plan is a healthy eating plan that has been shown to reduce high blood pressure (hypertension). It may also reduce your risk for type 2 diabetes, heart disease, and stroke. The DASH eating plan may also help with weight loss. What are tips for following this plan? General guidelines  Avoid eating more than 2,300 mg (milligrams) of salt (sodium) a day. If you have hypertension, you may need to reduce your sodium intake to 1,500 mg a day.  Limit alcohol intake to no more than 1 drink a day for nonpregnant women and 2 drinks a day for men. One drink equals 12 oz of beer, 5 oz of wine, or 1 oz of hard liquor.  Work with your health care provider to maintain a healthy body weight or to lose weight. Ask what an ideal weight is for you.  Get at least 30 minutes of exercise that causes your heart to beat faster (aerobic exercise) most days of the week. Activities may include walking, swimming, or biking.  Work with your health care provider or diet and nutrition specialist (dietitian) to adjust your eating plan to your individual calorie needs. Reading food labels  Check food labels for the amount of sodium per serving. Choose foods with less than 5 percent of the Daily Value of sodium. Generally, foods with less than 300 mg of sodium per serving fit into this eating plan.  To find whole grains, look for the word "whole" as the first word in the ingredient list. Shopping  Buy products labeled as "low-sodium" or "no salt added."  Buy fresh foods. Avoid canned foods and premade or frozen meals. Cooking  Avoid adding salt when cooking. Use salt-free seasonings or herbs instead of table salt or sea salt.  Check with your health care provider or pharmacist before using salt substitutes.  Do not fry foods. Cook foods using healthy methods such as baking, boiling, grilling, and broiling instead.  Cook with heart-healthy oils, such as olive, canola, soybean, or sunflower oil. Meal planning   Eat a balanced diet that includes: ? 5 or more servings of fruits and vegetables each day. At each meal, try to fill half of your plate with fruits and vegetables. ? Up to 6-8 servings of whole grains each day. ? Less than 6 oz of lean meat, poultry, or fish each day. A 3-oz serving of meat is about the same size as a deck of cards. One egg equals 1 oz. ? 2 servings of low-fat dairy each day. ? A serving of nuts, seeds, or beans 5 times each week. ? Heart-healthy fats. Healthy fats called Omega-3 fatty acids are found in foods such as flaxseeds and coldwater fish, like sardines, salmon, and mackerel.  Limit how much you eat of the following: ? Canned or prepackaged foods. ? Food that is high in trans fat, such as fried foods. ? Food that is high in saturated fat, such as fatty meat. ? Sweets, desserts, sugary drinks, and other foods with added sugar. ? Full-fat dairy products.  Do not salt foods before eating.  Try to eat at least 2 vegetarian meals each week.  Eat more home-cooked food and less restaurant, buffet, and fast food.  When  eating at a restaurant, ask that your food be prepared with less salt or no salt, if possible. What foods are recommended? The items listed may not be a complete list. Talk with your dietitian about what dietary choices are best for you. Grains Whole-grain or whole-wheat bread. Whole-grain or whole-wheat pasta. Derhammer rice. Modena Morrow. Bulgur. Whole-grain and low-sodium cereals. Pita bread. Low-fat, low-sodium crackers. Whole-wheat flour tortillas. Vegetables Fresh or frozen vegetables (raw, steamed, roasted, or grilled). Low-sodium or reduced-sodium tomato and  vegetable juice. Low-sodium or reduced-sodium tomato sauce and tomato paste. Low-sodium or reduced-sodium canned vegetables. Fruits All fresh, dried, or frozen fruit. Canned fruit in natural juice (without added sugar). Meat and other protein foods Skinless chicken or [place]. Ground chicken or [place]. Pork with fat trimmed off. Fish and seafood. Egg whites. Dried beans, peas, or lentils. Unsalted nuts, nut butters, and seeds. Unsalted canned beans. Lean cuts of beef with fat trimmed off. Low-sodium, lean deli meat. Dairy Low-fat (1%) or fat-free (skim) milk. Fat-free, low-fat, or reduced-fat cheeses. Nonfat, low-sodium ricotta or cottage cheese. Low-fat or nonfat yogurt. Low-fat, low-sodium cheese. Fats and oils Soft margarine without trans fats. Vegetable oil. Low-fat, reduced-fat, or light mayonnaise and salad dressings (reduced-sodium). Canola, safflower, olive, soybean, and sunflower oils. Avocado. Seasoning and other foods Herbs. Spices. Seasoning mixes without salt. Unsalted popcorn and pretzels. Fat-free sweets. What foods are not recommended? The items listed may not be a complete list. Talk with your dietitian about what dietary choices are best for you. Grains Baked goods made with fat, such as croissants, muffins, or some breads. Dry pasta or rice meal packs. Vegetables Creamed or fried vegetables. Vegetables in a cheese sauce. Regular canned vegetables (not low-sodium or reduced-sodium). Regular canned tomato sauce and paste (not low-sodium or reduced-sodium). Regular tomato and vegetable juice (not low-sodium or reduced-sodium). Angie Fava. Olives. Fruits Canned fruit in a light or heavy syrup. Fried fruit. Fruit in cream or butter sauce. Meat and other protein foods Fatty cuts of meat. Ribs. Fried meat. Berniece Salines. Sausage. Bologna and other processed lunch meats. Salami. Fatback. Hotdogs. Bratwurst. Salted nuts and seeds. Canned beans with added salt. Canned or smoked fish. Whole eggs or  egg yolks. Chicken or [place] with skin. Dairy Whole or 2% milk, cream, and half-and-half. Whole or full-fat cream cheese. Whole-fat or sweetened yogurt. Full-fat cheese. Nondairy creamers. Whipped toppings. Processed cheese and cheese spreads. Fats and oils Butter. Stick margarine. Lard. Shortening. Ghee. Bacon fat. Tropical oils, such as coconut, palm kernel, or palm oil. Seasoning and other foods Salted popcorn and pretzels. Onion salt, garlic salt, seasoned salt, table salt, and sea salt. Worcestershire sauce. Tartar sauce. Barbecue sauce. Teriyaki sauce. Soy sauce, including reduced-sodium. Steak sauce. Canned and packaged gravies. Fish sauce. Oyster sauce. Cocktail sauce. Horseradish that you find on the shelf. Ketchup. Mustard. Meat flavorings and tenderizers. Bouillon cubes. Hot sauce and Tabasco sauce. Premade or packaged marinades. Premade or packaged taco seasonings. Relishes. Regular salad dressings. Where to find more information:  National Heart, Lung, and [place]: https://wilson-eaton.com/  American Heart Association: www.heart.org Summary  The DASH eating plan is a healthy eating plan that has been shown to reduce high blood pressure (hypertension). It may also reduce your risk for type 2 diabetes, heart disease, and stroke.  With the DASH eating plan, you should limit salt (sodium) intake to 2,300 mg a day. If you have hypertension, you may need to reduce your sodium intake to 1,500 mg a day.  When on the DASH eating plan, aim  to eat more fresh fruits and vegetables, whole grains, lean proteins, low-fat dairy, and heart-healthy fats.  Work with your health care provider or diet and nutrition specialist (dietitian) to adjust your eating plan to your individual calorie needs. This information is not intended to replace advice given to you by your health care provider. Make sure you discuss any questions you have with your health care provider. Document Released: 06/13/2011  Document Revised: 06/17/2016 Document Reviewed: 06/17/2016 Elsevier Interactive Patient Education  2018 [employer].  Steps to Quit Smoking Smoking tobacco can be bad for your health. It can also affect almost every organ in your body. Smoking puts you and people around you at risk for many serious long-lasting (chronic) diseases. Quitting smoking is hard, but it is one of the best things that you can do for your health. It is never too late to quit. What are the benefits of quitting smoking? When you quit smoking, you lower your risk for getting serious diseases and conditions. They can include:  Lung cancer or lung disease.  Heart disease.  Stroke.  Heart attack.  Not being able to have children (infertility).  Weak bones (osteoporosis) and broken bones (fractures).  If you have coughing, wheezing, and shortness of breath, those symptoms may get better when you quit. You may also get sick less often. If you are pregnant, quitting smoking can help to lower your chances of having a baby of low birth weight. What can I do to help me quit smoking? Talk with your doctor about what can help you quit smoking. Some things you can do (strategies) include:  Quitting smoking totally, instead of slowly cutting back how much you smoke over a period of time.  Going to in-person counseling. You are more likely to quit if you go to many counseling sessions.  Using resources and support systems, such as: ? Agricultural engineer with a Veterinary surgeon. ? Phone quitlines. ? Automotive engineer. ? Support groups or group counseling. ? Text messaging programs. ? Mobile phone apps or applications.  Taking medicines. Some of these medicines may have nicotine in them. If you are pregnant or breastfeeding, do not take any medicines to quit smoking unless your doctor says it is okay. Talk with your doctor about counseling or other things that can help you.  Talk with your doctor about using more than one  strategy at the same time, such as taking medicines while you are also going to in-person counseling. This can help make quitting easier. What things can I do to make it easier to quit? Quitting smoking might feel very hard at first, but there is a lot that you can do to make it easier. Take these steps:  Talk to your family and friends. Ask them to support and encourage you.  Call phone quitlines, reach out to support groups, or work with a Veterinary surgeon.  Ask people who smoke to not smoke around you.  Avoid places that make you want (trigger) to smoke, such as: ? Bars. ? Parties. ? Smoke-break areas at work.  Spend time with people who do not smoke.  Lower the stress in your life. Stress can make you want to smoke. Try these things to help your stress: ? Getting regular exercise. ? Deep-breathing exercises. ? Yoga. ? Meditating. ? Doing a body scan. To do this, close your eyes, focus on one area of your body at a time from head to toe, and notice which parts of your body are tense. Try to  relax the muscles in those areas.  Download or buy apps on your mobile phone or tablet that can help you stick to your quit plan. There are many free apps, such as QuitGuide from the [employer] developer(Centers for Disease Control and Prevention). You can find more support from smokefree.gov and other websites.  This information is not intended to replace advice given to you by your health care provider. Make sure you discuss any questions you have with your health care provider. Document Released: 04/20/2009 Document Revised: 02/20/2016 Document Reviewed: 11/08/2014 Elsevier Interactive Patient Education  2018 [employer]

## 2017-07-30 ENCOUNTER — Telehealth: Payer: Self-pay | Admitting: Neurology

## 2017-07-30 ENCOUNTER — Ambulatory Visit: Payer: Self-pay

## 2017-07-30 LAB — COMPREHENSIVE METABOLIC PANEL
A/G RATIO: 1.3 (ref 1.2–2.2)
ALBUMIN: 4.3 g/dL (ref 3.5–5.5)
ALT: 13 IU/L (ref 0–32)
AST: 15 IU/L (ref 0–40)
Alkaline Phosphatase: 44 IU/L (ref 39–117)
BILIRUBIN TOTAL: 0.2 mg/dL (ref 0.0–1.2)
BUN / CREAT RATIO: 14 (ref 9–23)
BUN: 19 mg/dL (ref 6–20)
CALCIUM: 10.2 mg/dL (ref 8.7–10.2)
CO2: 27 mmol/L (ref 20–29)
Chloride: 99 mmol/L (ref 96–106)
Creatinine, Ser: 1.32 mg/dL — ABNORMAL HIGH (ref 0.57–1.00)
GFR, EST AFRICAN AMERICAN: 59 mL/min/{1.73_m2} — AB (ref >59)
GFR, EST NON AFRICAN AMERICAN: 51 mL/min/{1.73_m2} — AB (ref >59)
Globulin, Total: 3.3 g/dL (ref 1.5–4.5)
Glucose: 104 mg/dL — ABNORMAL HIGH (ref 65–99)
POTASSIUM: 4.1 mmol/L (ref 3.5–5.2)
Sodium: 143 mmol/L (ref 134–144)
TOTAL PROTEIN: 7.6 g/dL (ref 6.0–8.5)

## 2017-07-30 NOTE — Telephone Encounter (Signed)
Revonda Standardllison [facility] [phone] called request VO for speech therapy to address cognition 2 x 3.  She also wants to advise the patient says she is having intermittent numbness in the left and the lips since the stroke. Pt is wanting to know if that is normal is should it be looked into. Please call Revonda Standardllison to advise   [facility]

## 2017-07-30 NOTE — Telephone Encounter (Signed)
Ratazio with [facility] is requesting a call from RN to get verbal orders for OT for twice a week for one week and once a week for two weeks. Please contact at [phone]

## 2017-07-30 NOTE — Telephone Encounter (Signed)
Message sent to Dr. Pearlean BrownieSethi for review, and advice.

## 2017-07-30 NOTE — Telephone Encounter (Signed)
[facility]. Rn stated per Dr .Pearlean BrownieSEthi pt can have orders for OT for twice a week, and once a week for two weeks. Ratazio at [phone] verbalized understanding.

## 2017-07-31 NOTE — Telephone Encounter (Signed)
I spoke to the patient who informed me that she was having transient numbness involving left lower lip and left hand lasting 40 seconds or so off and on since her stroke but for the last 1 week it seems to be getting better.  She denies any accompanying headache vision loss weakness or any other focal stroke like symptoms.  I advised conservative follow-up for now since it seems to be improving.  She has an upcoming appointment to see me.  In case her symptoms worsen or she had new symptoms she was advised to call to be seen sooner if needed.  She voiced understanding.

## 2017-07-31 NOTE — Telephone Encounter (Signed)
Left vm for Suzanne Graham from [facility] to call back speech therapy orders. Also pt having intermittent numbness in lips on left side.

## 2017-07-31 NOTE — Telephone Encounter (Signed)
Rn receive [facility]. Rn stated per Dr.SEthi order for speech therapy to address cognition 2 times for 3 weeks can be done.  Rn stated Dr. Pearlean BrownieSethi call patient before he left for a conference about her intermittent numbness lips and hand. Per patient its getting better, and he told her to do conservative follow up. ALlison verbalized understanding.

## 2017-07-31 NOTE — Telephone Encounter (Signed)
Ok EdwardsAllison Mallroy returned RN's call. She said a message can be left on her VM as she will be with patients this afternoon, said she will do her best to answer the call

## 2017-08-02 ENCOUNTER — Telehealth: Payer: Self-pay | Admitting: Family Medicine

## 2017-08-02 NOTE — Telephone Encounter (Signed)
Advised patient that her renal function is mildly abnormal. I encourage water intake as this abnormalities could be secondary to dehydrations.  I will recheck renal function at next follow-up.  Godfrey PickKimberly S. Tiburcio PeaHarris, MSN, FNP-C The Patient Care [facility]  [address] [place], [place] [address] [phone]

## 2017-08-04 ENCOUNTER — Other Ambulatory Visit: Payer: Self-pay

## 2017-08-04 NOTE — Patient Outreach (Signed)
[facility] Care Management  08/04/2017  [name] 01/16/1981 [id number]   EMMI-Stroke Follow Up Call     Outreach attempt #1 to patient. Spoke with patient. She voices that she is doing well. She continues to get in home therapy. She voices that the sym,symptoms she was having are improving and getting better. patient did complete PCP f/u appt and established care with MD. She voices appt went well. She has f/u appt with neurology in a few weeks. Patient states she still has not picked up cholesterol pill and nicotine patches. She is unable to provide a reason. RN CM reiterated importance of taking meds and getting them ASAP. RN CM reviewed with patient notes and phone [facility]. Cholesterol med will cost $17.55 and patches are $49. Patient reported she will work on getting them. Advised patient that is she does not get them then she needs to notify her PCP. She voiced understanding. No further RN CM needs or interventions warranted at this time.      Plan: RN CM will notify [facility] administrative assistant of case status.   Antionette Fairyoshanda Travares Nelles, RN,BSN,CCM [facility] Care [facility] Telephonic Care Management Coordinator Direct Phone: [phone] Toll Free: [phone] Fax: [phone]

## 2017-08-04 NOTE — Telephone Encounter (Signed)
Patient notified

## 2017-08-06 DIAGNOSIS — R51 Headache: Secondary | ICD-10-CM

## 2017-08-06 DIAGNOSIS — R519 Headache, unspecified: Secondary | ICD-10-CM | POA: Insufficient documentation

## 2017-08-06 DIAGNOSIS — I10 Essential (primary) hypertension: Secondary | ICD-10-CM | POA: Insufficient documentation

## 2017-08-06 MED ORDER — LISINOPRIL-HYDROCHLOROTHIAZIDE 20-12.5 MG PO TABS: 1.0000 | ORAL_TABLET | Freq: Every day | ORAL | 0 refills | Status: DC

## 2017-08-06 MED FILL — LISINOPRIL-HCTZ 20-12.5 MG: 20-12.5 | 30 days supply | Qty: 30 | Fill #0

## 2017-08-08 ENCOUNTER — Telehealth: Payer: Self-pay

## 2017-08-08 MED FILL — ?ATORVASTATIN 20MG TABLET: 20 | 30 days supply | Qty: 30 | Fill #0

## 2017-08-08 NOTE — Telephone Encounter (Addendum)
Dr.Sethi came to me that pt needed blood pressure medicine because she ran out. Rn call patient and explain her the following:  Rn stated there is note she saw her PCP on July 29, 2017 for a follow up. Rn also stated the PCP sent her blood pressure lisinopril on [facility]/06/[facility] [address]. Rn gave pt the address of the pharmacy.Pt will call [facility] about the blood pressure medications.

## 2017-08-08 NOTE — Telephone Encounter (Signed)
Dr.Sethi reviewed chart and he knows patient saw PCP on 07/29/2017, and was given a rx on 08/06/2017. He would like for patient to call her PCP, or the pharmacy.

## 2017-08-08 NOTE — Telephone Encounter (Signed)
Revised. 

## 2017-08-12 ENCOUNTER — Telehealth: Payer: Self-pay

## 2017-08-12 DIAGNOSIS — R200 Anesthesia of skin: Secondary | ICD-10-CM

## 2017-08-12 DIAGNOSIS — I639 Cerebral infarction, unspecified: Secondary | ICD-10-CM

## 2017-08-12 NOTE — H&P (View-Only) (Signed)
  ELECTROPHYSIOLOGY CONSULT NOTE  Patient ID: Suzanne Graham MRN: [id number], DOB/AGE: 02/09/1981   Date of Consult: 08/13/2017  Primary Physician: Harris, Kimberly S, FNP Primary Cardiologist: new to [facility] Reason for Consultation: Cryptogenic stroke; recommendations regarding Implantable Loop Recorder  History of Present Illness: EP has been asked to evaluate Suzanne Graham for placement of an implantable loop recorder to monitor for atrial fibrillation by Dr Sethi.  The patient was admitted in January of this year with headache and blurry vision.  Imaging demonstrated embolic strokes in left PCA territory.  She underwent workup for stroke including echocardiogram and carotid dopplers.  The patient was monitored on telemetry which has demonstrated sinus rhythm with no arrhythmias.  Stroke work up is to be completed with TEE and ILR.  Echocardiogram 07/2017 demonstrated EF 60-65%, severe concentric hypertrophy, no RWMA, LA 36.  Lab work is reviewed.  The patient denies chest pain, shortness of breath, dizziness, or syncope. She does have palpitations that occur rarely (<1 time/month).  She is trying to quit smoking. She has stopped drinking ETOH.  She does not use recreational drugs.   Past Medical History:  Diagnosis Date  . Asthma   . Depression   . Gout   . Hypertension      Surgical History: History reviewed. No pertinent surgical history.   Current Outpatient Medications:  .  albuterol (PROVENTIL HFA;VENTOLIN HFA) 108 (90 Base) MCG/ACT inhaler, Inhale 2 puffs into the lungs every 4 (four) hours as needed for wheezing or shortness of breath., Disp: , Rfl:  .  aspirin 325 MG tablet, Take 1 tablet (325 mg total) by mouth daily., Disp: 30 tablet, Rfl: 0 .  atorvastatin (LIPITOR) 20 MG tablet, Take 1 tablet (20 mg total) by mouth daily at 6 PM., Disp: 90 tablet, Rfl: 0 .  lisinopril-hydrochlorothiazide (ZESTORETIC) 20-12.5 MG tablet, Take 1 tablet by mouth daily., Disp: 90 tablet,  Rfl: 0   Allergies: No Known Allergies  Social History   Socioeconomic History  . Marital status: Single    Spouse name: Not on file  . Number of children: Not on file  . Years of education: Not on file  . Highest education level: Not on file  Social Needs  . Financial resource strain: Not on file  . Food insecurity - worry: Not on file  . Food insecurity - inability: Not on file  . Transportation needs - medical: Not on file  . Transportation needs - non-medical: Not on file  Occupational History  . Not on file  Tobacco Use  . Smoking status: Current Every Day Smoker    Packs/day: 0.50    Types: Cigarettes  . Smokeless tobacco: Never Used  Substance and Sexual Activity  . Alcohol use: Yes    Comment: occasinally  . Drug use: No  . Sexual activity: Not on file  Other Topics Concern  . Not on file  Social History Narrative  . Not on file     Family History  Problem Relation Age of Onset  . Heart failure Mother   . Hypertension Mother   . Stroke Neg Hx       Review of Systems: All other systems reviewed and are otherwise negative except as noted above.  Physical Exam: Vitals:   08/13/17 0831 08/13/17 0838  BP: 122/60 118/64  Pulse: 63   SpO2: 97%   Weight: 217 lb 6.4 oz (98.6 kg)   Height: 5' 4" (1.626 m)     GEN- The patient is obese   appearing, alert and oriented x 3 today.   Head- normocephalic, atraumatic Eyes-  Sclera clear, conjunctiva pink Ears- hearing intact Oropharynx- clear Neck- supple Lungs- Clear to ausculation bilaterally, normal work of breathing Heart- Regular rate and rhythm   GI- soft, NT, ND, + BS Extremities- no clubbing, cyanosis, or edema MS- no significant deformity or atrophy Skin- no rash or lesion Psych- euthymic mood, full affect   Labs:   Lab Results  Component Value Date   WBC 6.8 07/17/2017   HGB 11.8 (L) 07/17/2017   HCT 37.1 07/17/2017   MCV 75.9 (L) 07/17/2017   PLT 236 07/17/2017   No results for  input(s): NA, K, CL, CO2, BUN, CREATININE, CALCIUM, PROT, BILITOT, ALKPHOS, ALT, AST, GLUCOSE in the last 168 hours.  Invalid input(s): LABALBU   Radiology/Studies: Ct Angio Head W Or Wo Contrast  Result Date: 07/17/2017 CLINICAL DATA:  36 y/o  F; vision changes. EXAM: CT ANGIOGRAPHY HEAD AND NECK TECHNIQUE: Multidetector CT imaging of the head and neck was performed using the standard protocol during bolus administration of intravenous contrast. Multiplanar CT image reconstructions and MIPs were obtained to evaluate the vascular anatomy. Carotid stenosis measurements (when applicable) are obtained utilizing NASCET criteria, using the distal internal carotid diameter as the denominator. CONTRAST:  100mL ISOVUE-370 IOPAMIDOL (ISOVUE-370) INJECTION 76% COMPARISON:  Same-day MRI of the brain. FINDINGS: CTA NECK FINDINGS Aortic arch: Bovine variant branching. Imaged portion shows no evidence of aneurysm or dissection. No significant stenosis of the major arch vessel origins. Mild calcific atherosclerosis. Right carotid system: No evidence of dissection, stenosis (50% or greater) or occlusion. Left carotid system: No evidence of dissection, stenosis (50% or greater) or occlusion. Vertebral arteries: Left dominant. No evidence of dissection, stenosis (50% or greater) or occlusion. Skeleton: Negative. Other neck: Negative. Upper chest: 13 mm left cheek dermal cyst. Review of the MIP images confirms the above findings CTA HEAD FINDINGS Anterior circulation: No significant stenosis, proximal occlusion, aneurysm, or vascular malformation. Posterior circulation: Bilateral P2 short segments of occlusion/near occlusion with distal reconstitution (series 12, image 23 and 17 as well as series 8, image 103). Otherwise no large vessel occlusion, aneurysm, or significant stenosis is identified. Venous sinuses: As permitted by contrast timing, patent. Anatomic variants: Anterior and bilateral posterior communicating arteries are  present. Delayed phase: No abnormal intracranial enhancement. Review of the MIP images confirms the above findings IMPRESSION: 1. Negative CTA of the neck. 2. Bilateral P2 short segments of occlusion/near occlusion with distal reconstitution. Given patient's age and symmetry consideration for vasculitis, reversible cerebral vasoconstriction syndrome, or vasospasm should be considered in addition to thromboembolic disease. 3. Otherwise negative CTA of the head. These results were called by telephone at the time of interpretation on 07/17/2017 at 6:19 pm to Dr. Isaacs , who verbally acknowledged these results. Electronically Signed   By: Lance  Furusawa-Stratton M.D.   On: 07/17/2017 18:31   Ct Head Wo Contrast  Result Date: 07/20/2017 CLINICAL DATA:  Headache for the past 2 days. History of hypertension. EXAM: CT HEAD WITHOUT CONTRAST TECHNIQUE: Contiguous axial images were obtained from the base of the skull through the vertex without intravenous contrast. COMPARISON:  Head CT dated 07/17/2017 and brain MR dated 07/17/2017. FINDINGS: Brain: Better defined previously demonstrated left occipital lobe infarct. There is also better defined infarct in the posterior left temporal lobe. No areas of acute infarction visible. Normal size and position of the ventricles. No intracranial hemorrhage mass. Vascular: No hyperdense vessel or unexpected calcification. Skull: Normal. Negative   for fracture or focal lesion. Sinuses/Orbits: Mild left maxillary and bilateral ethmoid sinus mucosal thickening. Minimal right sphenoid sinus mucosal thickening. Unremarkable orbits. Other: None. IMPRESSION: 1. Better defined previously demonstrated infarcts. 2. No new areas of infarction visible by CT. 3. No intracranial hemorrhage. 4. Mild chronic sinusitis. Electronically Signed   By: Steven  Reid M.D.   On: 07/20/2017 12:08   Ct Head Wo Contrast  Result Date: 07/17/2017 CLINICAL DATA:  Right eye blurred vision. EXAM: CT HEAD WITHOUT  CONTRAST TECHNIQUE: Contiguous axial images were obtained from the base of the skull through the vertex without intravenous contrast. COMPARISON:  None. FINDINGS: Brain: Low-density noted in the left occipital lobe compatible with infarction. No hemorrhage or hydrocephalus. No mass lesion. Vascular: No hyperdense vessel or unexpected calcification. Skull: No acute calvarial abnormality. Sinuses/Orbits: No acute finding. Other: None IMPRESSION: Low-density in the left occipital lobe compatible with acute to subacute infarct. Electronically Signed   By: Kevin  Dover M.D.   On: 07/17/2017 15:49   Ct Angio Neck W Or Wo Contrast  Result Date: 07/17/2017 CLINICAL DATA:  36 y/o  F; vision changes. EXAM: CT ANGIOGRAPHY HEAD AND NECK TECHNIQUE: Multidetector CT imaging of the head and neck was performed using the standard protocol during bolus administration of intravenous contrast. Multiplanar CT image reconstructions and MIPs were obtained to evaluate the vascular anatomy. Carotid stenosis measurements (when applicable) are obtained utilizing NASCET criteria, using the distal internal carotid diameter as the denominator. CONTRAST:  100mL ISOVUE-370 IOPAMIDOL (ISOVUE-370) INJECTION 76% COMPARISON:  Same-day MRI of the brain. FINDINGS: CTA NECK FINDINGS Aortic arch: Bovine variant branching. Imaged portion shows no evidence of aneurysm or dissection. No significant stenosis of the major arch vessel origins. Mild calcific atherosclerosis. Right carotid system: No evidence of dissection, stenosis (50% or greater) or occlusion. Left carotid system: No evidence of dissection, stenosis (50% or greater) or occlusion. Vertebral arteries: Left dominant. No evidence of dissection, stenosis (50% or greater) or occlusion. Skeleton: Negative. Other neck: Negative. Upper chest: 13 mm left cheek dermal cyst. Review of the MIP images confirms the above findings CTA HEAD FINDINGS Anterior circulation: No significant stenosis, proximal  occlusion, aneurysm, or vascular malformation. Posterior circulation: Bilateral P2 short segments of occlusion/near occlusion with distal reconstitution (series 12, image 23 and 17 as well as series 8, image 103). Otherwise no large vessel occlusion, aneurysm, or significant stenosis is identified. Venous sinuses: As permitted by contrast timing, patent. Anatomic variants: Anterior and bilateral posterior communicating arteries are present. Delayed phase: No abnormal intracranial enhancement. Review of the MIP images confirms the above findings IMPRESSION: 1. Negative CTA of the neck. 2. Bilateral P2 short segments of occlusion/near occlusion with distal reconstitution. Given patient's age and symmetry consideration for vasculitis, reversible cerebral vasoconstriction syndrome, or vasospasm should be considered in addition to thromboembolic disease. 3. Otherwise negative CTA of the head. These results were called by telephone at the time of interpretation on 07/17/2017 at 6:19 pm to Dr. Isaacs , who verbally acknowledged these results. Electronically Signed   By: Lance  Furusawa-Stratton M.D.   On: 07/17/2017 18:31   Mr Brain Wo Contrast  Result Date: 07/17/2017 CLINICAL DATA:  36 y/o F; right-sided eye blurriness and visual field changes. EXAM: MRI HEAD WITHOUT CONTRAST TECHNIQUE: Multiplanar, multiecho pulse sequences of the brain and surrounding structures were obtained without intravenous contrast. COMPARISON:  07/17/2016 CT head.  CT angiogram of head. FINDINGS: Brain: Multiple foci of reduced diffusion are present throughout the left PCA distribution involving left medial   temporal lobe, occipital lobe, left splenium of corpus callosum, and left thalamus compatible with acute/early subacute infarction. Areas of infarction demonstrate T2 FLAIR hyperintense signal abnormality. No significant mass effect or hemorrhage. Fewnonspecific foci of T2 FLAIR hyperintense signal abnormality in subcortical and  periventricular white matter are compatible withmildchronic microvascular ischemic changes for age. No extra-axial collection, hydrocephalus, or effacement of basilar cisterns. No herniation. Vascular: Refer to same-day CT angiogram of head. Skull and upper cervical spine: Normal marrow signal. Sinuses/Orbits: Moderate maxillary and anterior ethmoid sinus mucosal thickening. No abnormal signal of mastoid air cells. Orbits are unremarkable. Other: None. IMPRESSION: Several areas of acute/early subacute infarction within the left posterior cerebral artery distribution. No hemorrhage or mass effect. Electronically Signed   By: Lance  Furusawa-Stratton M.D.   On: 07/17/2017 18:16    12-lead ECG sinus rhythm (personally reviewed) All prior EKG's in EPIC reviewed with no documented atrial fibrillation  Telemetry sinus rhythm (personally reviewed)  Assessment and Plan:  1. Cryptogenic stroke The patient presented with cryptogenic stroke.  Neurology has recommended TEE and ILR implant.  I spoke at length with the patient and her mother about monitoring for afib with an implantable loop recorder.  Risks, benefits, and alteratives to TEE and implantable loop recorder were discussed with the patient today.   At this time, the patient is very clear in their decision to proceed with TEE and implantable loop recorder. Will schedule at the next available time.   We discussed financial obligations with ILR monitoring. They are applying for social security as well as medicaid and would like to proceed at this time.   Suzanne Kohl MD, FACC 08/13/2017 9:40 AM   

## 2017-08-12 NOTE — Telephone Encounter (Signed)
Patient needs a referral place for outpatient speech therapy to address cognition.

## 2017-08-12 NOTE — Progress Notes (Signed)
ELECTROPHYSIOLOGY CONSULT NOTE  [name] MRN: [id number], DOB/AGE: 39/07/1980   Date of Consult: 08/13/2017  Primary Physician: Bing NeighborsHarris, Suzanne S, FNP Primary Cardiologist: new [facility] Reason for Consultation: Cryptogenic stroke; recommendations regarding Implantable Loop Recorder  History of Present Illness: EP has been asked to evaluate Suzanne Graham for placement of an implantable loop recorder to monitor for atrial fibrillation by Dr Pearlean BrownieSethi.  Suzanne Graham was admitted in January of this year with headache and blurry vision.  Imaging demonstrated embolic strokes in left PCA territory.  She underwent workup for stroke including echocardiogram and carotid dopplers.  Suzanne Graham was monitored on telemetry which has demonstrated sinus rhythm with no arrhythmias.  Stroke work up is to be completed with TEE and ILR.  Echocardiogram 07/2017 demonstrated EF 60-65%, severe concentric hypertrophy, no RWMA, LA 36.  Lab work is reviewed.  Suzanne Graham denies chest pain, shortness of breath, dizziness, or syncope. She does have palpitations that occur rarely (<1 time/month).  She is trying to quit smoking. She has stopped drinking ETOH.  She does not use recreational drugs.   Past Medical History:  Diagnosis Date  . Asthma   . Depression   . Gout   . Hypertension      Surgical History: History reviewed. No pertinent surgical history.   Current Outpatient Medications:  .  albuterol (PROVENTIL HFA;VENTOLIN HFA) 108 (90 Base) MCG/ACT inhaler, Inhale 2 puffs into Suzanne lungs every 4 (four) hours as needed for wheezing or shortness of breath., Disp: , Rfl:  .  aspirin 325 MG tablet, Take 1 tablet (325 mg total) by mouth daily., Disp: 30 tablet, Rfl: 0 .  atorvastatin (LIPITOR) 20 MG tablet, Take 1 tablet (20 mg total) by mouth daily at 6 PM., Disp: 90 tablet, Rfl: 0 .  lisinopril-hydrochlorothiazide (ZESTORETIC) 20-12.5 MG tablet, Take 1 tablet by mouth daily., Disp: 90 tablet,  Rfl: 0   Allergies: No Known Allergies  Social History   Socioeconomic History  . Marital status: Single    Spouse name: Not on file  . Number of children: Not on file  . Years of education: Not on file  . Highest education level: Not on file  Social Needs  . Financial resource strain: Not on file  . Food insecurity - worry: Not on file  . Food insecurity - inability: Not on file  . Transportation needs - medical: Not on file  . Transportation needs - non-medical: Not on file  Occupational History  . Not on file  Tobacco Use  . Smoking status: Current Every Day Smoker    Packs/day: 0.50    Types: Cigarettes  . Smokeless tobacco: Never Used  Substance and Sexual Activity  . Alcohol use: Yes    Comment: occasinally  . Drug use: No  . Sexual activity: Not on file  Other Topics Concern  . Not on file  Social History Narrative  . Not on file     Family History  Problem Relation Age of Onset  . Heart failure Graham   . Hypertension Graham   . Stroke Neg Hx       Review of Systems: All other systems reviewed and are otherwise negative except as noted above.  Physical Exam: Vitals:   08/13/17 0831 08/13/17 0838  BP: 122/60 118/64  Pulse: 63   SpO2: 97%   Weight: 217 lb 6.4 oz (98.6 kg)   Height: 5\' 4"  (1.626 m)     GEN- Suzanne Graham is obese  appearing, alert and oriented x 3 today.   Head- normocephalic, atraumatic Eyes-  Sclera clear, conjunctiva pink Ears- hearing intact Oropharynx- clear Neck- supple Lungs- Clear to ausculation bilaterally, normal work of breathing Heart- Regular rate and rhythm   GI- soft, NT, ND, + BS Extremities- no clubbing, cyanosis, or edema MS- no significant deformity or atrophy Skin- no rash or lesion Psych- euthymic mood, full affect   Labs:   Lab Results  Component Value Date   WBC 6.8 07/17/2017   HGB 11.8 (L) 07/17/2017   HCT 37.1 07/17/2017   MCV 75.9 (L) 07/17/2017   PLT 236 07/17/2017   No results for  input(s): NA, K, CL, CO2, BUN, CREATININE, CALCIUM, PROT, BILITOT, ALKPHOS, ALT, AST, GLUCOSE in Suzanne last 168 hours.  Invalid input(s): LABALBU   Radiology/Studies: Ct Angio Head W Or Wo Contrast  Result Date: 07/17/2017 CLINICAL DATA:  37 y/o  F; vision changes. EXAM: CT ANGIOGRAPHY HEAD AND NECK TECHNIQUE: Multidetector CT imaging of Suzanne head and neck was performed using Suzanne standard protocol during bolus administration of intravenous contrast. Multiplanar CT image reconstructions and MIPs were obtained to evaluate Suzanne vascular anatomy. Carotid stenosis measurements (when applicable) are obtained utilizing NASCET criteria, using Suzanne distal internal carotid diameter as Suzanne denominator. CONTRAST:  ISOVUE-370 IOPAMIDOL (ISOVUE-370) INJECTION 76% COMPARISON:  Same-day MRI of Suzanne brain. FINDINGS: CTA NECK FINDINGS Aortic arch: Bovine variant branching. Imaged portion shows no evidence of aneurysm or dissection. No significant stenosis of Suzanne major arch vessel origins. Mild calcific atherosclerosis. Right carotid system: No evidence of dissection, stenosis (50% or greater) or occlusion. Left carotid system: No evidence of dissection, stenosis (50% or greater) or occlusion. Vertebral arteries: Left dominant. No evidence of dissection, stenosis (50% or greater) or occlusion. Skeleton: Negative. Other neck: Negative. Upper chest: 13 mm left cheek dermal cyst. Review of Suzanne MIP images confirms Suzanne above findings CTA HEAD FINDINGS Anterior circulation: No significant stenosis, proximal occlusion, aneurysm, or vascular malformation. Posterior circulation: Bilateral P2 short segments of occlusion/near occlusion with distal reconstitution (series 12, image 23 and 17 as well as series 8, image 103). Otherwise no large vessel occlusion, aneurysm, or significant stenosis is identified. Venous sinuses: As permitted by contrast timing, patent. Anatomic variants: Anterior and bilateral posterior communicating arteries are  present. Delayed phase: No abnormal intracranial enhancement. Review of Suzanne MIP images confirms Suzanne above findings IMPRESSION: 1. Negative CTA of Suzanne neck. 2. Bilateral P2 short segments of occlusion/near occlusion with distal reconstitution. Given Graham's age and symmetry consideration for vasculitis, reversible cerebral vasoconstriction syndrome, or vasospasm should be considered in addition to thromboembolic disease. 3. Otherwise negative CTA of Suzanne head. These results were called by telephone at Suzanne time of interpretation on 07/17/2017 at 6:19 pm to Dr. Erma Heritage , who verbally acknowledged these results. Electronically Signed   By: Mitzi Hansen M.D.   On: 07/17/2017 18:31   Ct Head Wo Contrast  Result Date: 07/20/2017 CLINICAL DATA:  Headache for Suzanne past 2 days. History of hypertension. EXAM: CT HEAD WITHOUT CONTRAST TECHNIQUE: Contiguous axial images were obtained from Suzanne base of Suzanne skull through Suzanne vertex without intravenous contrast. COMPARISON:  Head CT dated 07/17/2017 and brain MR dated 07/17/2017. FINDINGS: Brain: Better defined previously demonstrated left occipital lobe infarct. There is also better defined infarct in Suzanne posterior left temporal lobe. No areas of acute infarction visible. Normal size and position of Suzanne ventricles. No intracranial hemorrhage mass. Vascular: No hyperdense vessel or unexpected calcification. Skull: Normal. Negative  for fracture or focal lesion. Sinuses/Orbits: Mild left maxillary and bilateral ethmoid sinus mucosal thickening. Minimal right sphenoid sinus mucosal thickening. Unremarkable orbits. Other: None. IMPRESSION: 1. Better defined previously demonstrated infarcts. 2. No new areas of infarction visible by CT. 3. No intracranial hemorrhage. 4. Mild chronic sinusitis. Electronically Signed   By: Beckie Salts M.D.   On: 07/20/2017 12:08   Ct Head Wo Contrast  Result Date: 07/17/2017 CLINICAL DATA:  Right eye blurred vision. EXAM: CT HEAD WITHOUT  CONTRAST TECHNIQUE: Contiguous axial images were obtained from Suzanne base of Suzanne skull through Suzanne vertex without intravenous contrast. COMPARISON:  None. FINDINGS: Brain: Low-density noted in Suzanne left occipital lobe compatible with infarction. No hemorrhage or hydrocephalus. No mass lesion. Vascular: No hyperdense vessel or unexpected calcification. Skull: No acute calvarial abnormality. Sinuses/Orbits: No acute finding. Other: None IMPRESSION: Low-density in Suzanne left occipital lobe compatible with acute to subacute infarct. Electronically Signed   By: Charlett Nose M.D.   On: 07/17/2017 15:49   Ct Angio Neck W Or Wo Contrast  Result Date: 07/17/2017 CLINICAL DATA:  37 y/o  F; vision changes. EXAM: CT ANGIOGRAPHY HEAD AND NECK TECHNIQUE: Multidetector CT imaging of Suzanne head and neck was performed using Suzanne standard protocol during bolus administration of intravenous contrast. Multiplanar CT image reconstructions and MIPs were obtained to evaluate Suzanne vascular anatomy. Carotid stenosis measurements (when applicable) are obtained utilizing NASCET criteria, using Suzanne distal internal carotid diameter as Suzanne denominator. CONTRAST:  ISOVUE-370 IOPAMIDOL (ISOVUE-370) INJECTION 76% COMPARISON:  Same-day MRI of Suzanne brain. FINDINGS: CTA NECK FINDINGS Aortic arch: Bovine variant branching. Imaged portion shows no evidence of aneurysm or dissection. No significant stenosis of Suzanne major arch vessel origins. Mild calcific atherosclerosis. Right carotid system: No evidence of dissection, stenosis (50% or greater) or occlusion. Left carotid system: No evidence of dissection, stenosis (50% or greater) or occlusion. Vertebral arteries: Left dominant. No evidence of dissection, stenosis (50% or greater) or occlusion. Skeleton: Negative. Other neck: Negative. Upper chest: 13 mm left cheek dermal cyst. Review of Suzanne MIP images confirms Suzanne above findings CTA HEAD FINDINGS Anterior circulation: No significant stenosis, proximal  occlusion, aneurysm, or vascular malformation. Posterior circulation: Bilateral P2 short segments of occlusion/near occlusion with distal reconstitution (series 12, image 23 and 17 as well as series 8, image 103). Otherwise no large vessel occlusion, aneurysm, or significant stenosis is identified. Venous sinuses: As permitted by contrast timing, patent. Anatomic variants: Anterior and bilateral posterior communicating arteries are present. Delayed phase: No abnormal intracranial enhancement. Review of Suzanne MIP images confirms Suzanne above findings IMPRESSION: 1. Negative CTA of Suzanne neck. 2. Bilateral P2 short segments of occlusion/near occlusion with distal reconstitution. Given Graham's age and symmetry consideration for vasculitis, reversible cerebral vasoconstriction syndrome, or vasospasm should be considered in addition to thromboembolic disease. 3. Otherwise negative CTA of Suzanne head. These results were called by telephone at Suzanne time of interpretation on 07/17/2017 at 6:19 pm to Dr. Erma Heritage , who verbally acknowledged these results. Electronically Signed   By: Mitzi Hansen M.D.   On: 07/17/2017 18:31   Mr Brain Wo Contrast  Result Date: 07/17/2017 CLINICAL DATA:  37 y/o F; right-sided eye blurriness and visual field changes. EXAM: MRI HEAD WITHOUT CONTRAST TECHNIQUE: Multiplanar, multiecho pulse sequences of Suzanne brain and surrounding structures were obtained without intravenous contrast. COMPARISON:  07/17/2016 CT head.  CT angiogram of head. FINDINGS: Brain: Multiple foci of reduced diffusion are present throughout Suzanne left PCA distribution involving left medial  temporal lobe, occipital lobe, left splenium of corpus callosum, and left thalamus compatible with acute/early subacute infarction. Areas of infarction demonstrate T2 FLAIR hyperintense signal abnormality. No significant mass effect or hemorrhage. Fewnonspecific foci of T2 FLAIR hyperintense signal abnormality in subcortical and  periventricular white matter are compatible withmildchronic microvascular ischemic changes for age. No extra-axial collection, hydrocephalus, or effacement of basilar cisterns. No herniation. Vascular: Refer to same-day CT angiogram of head. Skull and upper cervical spine: Normal marrow signal. Sinuses/Orbits: Moderate maxillary and anterior ethmoid sinus mucosal thickening. No abnormal signal of mastoid air cells. Orbits are unremarkable. Other: None. IMPRESSION: Several areas of acute/early subacute infarction within Suzanne left posterior cerebral artery distribution. No hemorrhage or mass effect. Electronically Signed   By: Mitzi Hansen M.D.   On: 07/17/2017 18:16    12-lead ECG sinus rhythm (personally reviewed) All prior EKG's in EPIC reviewed with no documented atrial fibrillation  Telemetry sinus rhythm (personally reviewed)  Assessment and Plan:  1. Cryptogenic stroke Suzanne Graham presented with cryptogenic stroke.  Neurology has recommended TEE and ILR implant.  I spoke at length with Suzanne Graham and Suzanne Graham about monitoring for afib with an implantable loop recorder.  Risks, benefits, and alteratives to TEE and implantable loop recorder were discussed with Suzanne Graham today.   At this time, Suzanne Graham is very clear in their decision to proceed with TEE and implantable loop recorder. Will schedule at Suzanne next available time.   We discussed financial obligations with ILR monitoring. They are applying for social security as well as medicaid and would like to proceed at this time.   Hillis Range MD, [facility] 08/13/2017 9:40 AM

## 2017-08-13 ENCOUNTER — Telehealth: Payer: Self-pay | Admitting: *Deleted

## 2017-08-13 ENCOUNTER — Encounter: Payer: Self-pay | Admitting: *Deleted

## 2017-08-13 ENCOUNTER — Ambulatory Visit (INDEPENDENT_AMBULATORY_CARE_PROVIDER_SITE_OTHER): Payer: Self-pay | Admitting: Internal Medicine

## 2017-08-13 ENCOUNTER — Encounter (INDEPENDENT_AMBULATORY_CARE_PROVIDER_SITE_OTHER): Payer: Self-pay

## 2017-08-13 ENCOUNTER — Encounter: Payer: Self-pay | Admitting: Nurse Practitioner

## 2017-08-13 VITALS — BP 118/64 | HR 63 | Ht 64.0 in | Wt 217.4 lb

## 2017-08-13 DIAGNOSIS — I639 Cerebral infarction, unspecified: Secondary | ICD-10-CM

## 2017-08-13 NOTE — Patient Instructions (Addendum)
Medication Instructions:   Your physician recommends that you continue on your current medications as directed. Please refer to the Current Medication list given to you today.   If you need a refill on your cardiac medications before your next appointment, please call your pharmacy.  Labwork: NONE ORDERED  TODAY    Testing/Procedures: SEE LETTER FOR TEE AND LOOP RECORDER    Follow-Up:   YOU WILL BE CONTACTED BACK FOR FOLLOW UP BASED UPON RESULTS   Any Other Special Instructions Will Be Listed Below (If Applicable).

## 2017-08-13 NOTE — Telephone Encounter (Signed)
SPOKE  WITH PT  AND PT AWARE  INSTRUCTION LETTER FOR TEE AND LOOP RECORDER AT CHECK IN DESK AREA FOR PICK UP

## 2017-08-13 NOTE — Telephone Encounter (Signed)
Order placed for speech therapy.

## 2017-08-19 ENCOUNTER — Ambulatory Visit (HOSPITAL_COMMUNITY)
Admission: RE | Admit: 2017-08-19 | Discharge: 2017-08-19 | Disposition: A | Discharge status: Home / Self Care | Payer: Self-pay | Source: Ambulatory Visit | Attending: Cardiology | Admitting: Cardiology

## 2017-08-19 ENCOUNTER — Encounter (HOSPITAL_COMMUNITY)
Admission: RE | Disposition: A | Discharge status: Home / Self Care | Payer: Self-pay | Source: Ambulatory Visit | Attending: Cardiology

## 2017-08-19 ENCOUNTER — Ambulatory Visit (HOSPITAL_BASED_OUTPATIENT_CLINIC_OR_DEPARTMENT_OTHER): Payer: Self-pay

## 2017-08-19 ENCOUNTER — Encounter (HOSPITAL_COMMUNITY): Payer: Self-pay | Admitting: *Deleted

## 2017-08-19 DIAGNOSIS — I6389 Other cerebral infarction: Secondary | ICD-10-CM

## 2017-08-19 DIAGNOSIS — F17210 Nicotine dependence, cigarettes, uncomplicated: Secondary | ICD-10-CM | POA: Insufficient documentation

## 2017-08-19 DIAGNOSIS — I10 Essential (primary) hypertension: Secondary | ICD-10-CM | POA: Insufficient documentation

## 2017-08-19 DIAGNOSIS — F329 Major depressive disorder, single episode, unspecified: Secondary | ICD-10-CM | POA: Insufficient documentation

## 2017-08-19 DIAGNOSIS — Z8249 Family history of ischemic heart disease and other diseases of the circulatory system: Secondary | ICD-10-CM | POA: Insufficient documentation

## 2017-08-19 DIAGNOSIS — J45909 Unspecified asthma, uncomplicated: Secondary | ICD-10-CM | POA: Insufficient documentation

## 2017-08-19 DIAGNOSIS — Z79899 Other long term (current) drug therapy: Secondary | ICD-10-CM | POA: Insufficient documentation

## 2017-08-19 DIAGNOSIS — I639 Cerebral infarction, unspecified: Secondary | ICD-10-CM | POA: Insufficient documentation

## 2017-08-19 HISTORY — PX: TEE WITHOUT CARDIOVERSION: SHX5443

## 2017-08-19 HISTORY — PX: LOOP RECORDER INSERTION: EP1214

## 2017-08-19 SURGERY — LOOP RECORDER INSERTION

## 2017-08-19 SURGERY — ECHOCARDIOGRAM, TRANSESOPHAGEAL
Anesthesia: Moderate Sedation

## 2017-08-19 MED ORDER — BUTAMBEN-TETRACAINE-BENZOCAINE 2-2-14 % EX AERO
INHALATION_SPRAY | CUTANEOUS | Status: DC | PRN
  Administered 2017-08-19: 2 via TOPICAL

## 2017-08-19 MED ORDER — LIDOCAINE-EPINEPHRINE 1 %-1:100000 IJ SOLN
INTRAMUSCULAR | Status: AC
  Filled 2017-08-19: qty 1

## 2017-08-19 MED ORDER — LIDOCAINE VISCOUS 2 % MT SOLN
OROMUCOSAL | Status: AC
  Filled 2017-08-19: qty 15

## 2017-08-19 MED ORDER — MIDAZOLAM HCL 5 MG/ML IJ SOLN
INTRAMUSCULAR | Status: AC
  Filled 2017-08-19: qty 2

## 2017-08-19 MED ORDER — MIDAZOLAM HCL 10 MG/2ML IJ SOLN
INTRAMUSCULAR | Status: DC | PRN
  Administered 2017-08-19 (×4): 2 mg via INTRAVENOUS

## 2017-08-19 MED ORDER — SODIUM CHLORIDE 0.9 % IV SOLN
INTRAVENOUS | Status: DC
  Administered 2017-08-19: 07:00:00 via INTRAVENOUS

## 2017-08-19 MED ORDER — LIDOCAINE HCL (PF) 1 % IJ SOLN
INTRAMUSCULAR | Status: DC | PRN
  Administered 2017-08-19: 20 mL

## 2017-08-19 MED ORDER — FENTANYL CITRATE (PF) 100 MCG/2ML IJ SOLN
INTRAMUSCULAR | Status: AC
  Filled 2017-08-19: qty 2

## 2017-08-19 MED ORDER — FENTANYL CITRATE (PF) 100 MCG/2ML IJ SOLN
INTRAMUSCULAR | Status: DC | PRN
  Administered 2017-08-19 (×3): 25 ug via INTRAVENOUS

## 2017-08-19 MED ORDER — DIPHENHYDRAMINE HCL 50 MG/ML IJ SOLN
INTRAMUSCULAR | Status: AC
  Filled 2017-08-19: qty 1

## 2017-08-19 SURGICAL SUPPLY — 2 items
LOOP REVEAL LINQSYS (Prosthesis & Implant Heart) ×3 IMPLANT
PACK LOOP INSERTION (CUSTOM PROCEDURE TRAY) ×3 IMPLANT

## 2017-08-19 NOTE — Discharge Instructions (Signed)
TEE ° °YOU HAD AN CARDIAC PROCEDURE TODAY: Refer to the procedure report and other information in the discharge instructions given to you for any specific questions about what was found during the examination. If this information does not answer your questions, please call [facility] office at [phone] to clarify.  ° °DIET: Your first meal following the procedure should be a light meal and then it is ok to progress to your normal diet. A half-sandwich or bowl of soup is an example of a good first meal. Heavy or fried foods are harder to digest and may make you feel nauseous or bloated. Drink plenty of fluids but you should avoid alcoholic beverages for 24 hours. If you had a esophageal dilation, please see attached instructions for diet.  ° °ACTIVITY: Your care partner should take you home directly after the procedure. You should plan to take it easy, moving slowly for the rest of the day. You can resume normal activity the day after the procedure however YOU SHOULD NOT DRIVE, use power tools, machinery or perform tasks that involve climbing or major physical exertion for 24 hours (because of the sedation medicines used during the test).  ° °SYMPTOMS TO REPORT IMMEDIATELY: °A cardiologist can be reached at any hour. Please call [phone] for any of the following symptoms:  °Vomiting of blood or coffee ground material  °New, significant abdominal pain  °New, significant chest pain or pain under the shoulder blades  °Painful or persistently difficult swallowing  °New shortness of breath  °Black, tarry-looking or red, bloody stools ° °FOLLOW UP:  °Please also call with any specific questions about appointments or follow up tests. ° ° °Moderate Conscious Sedation, Adult, Care After °These instructions provide you with information about caring for yourself after your procedure. Your health care provider may also give you more specific instructions. Your treatment has been planned according to current medical  practices, but problems sometimes occur. Call your health care provider if you have any problems or questions after your procedure. °What can I expect after the procedure? °After your procedure, it is common: °· To feel sleepy for several hours. °· To feel clumsy and have poor balance for several hours. °· To have poor judgment for several hours. °· To vomit if you eat too soon. ° °Follow these instructions at home: °For at least 24 hours after the procedure: ° °· Do not: °? Participate in activities where you could fall or become injured. °? Drive. °? Use heavy machinery. °? Drink alcohol. °? Take sleeping pills or medicines that cause drowsiness. °? Make important decisions or sign legal documents. °? Take care of children on your own. °· Rest. °Eating and drinking °· Follow the diet recommended by your health care provider. °· If you vomit: °? Drink water, juice, or soup when you can drink without vomiting. °? Make sure you have little or no nausea before eating solid foods. °General instructions °· Have a responsible adult stay with you until you are awake and alert. °· Take over-the-counter and prescription medicines only as told by your health care provider. °· If you smoke, do not smoke without supervision. °· Keep all follow-up visits as told by your health care provider. This is important. °Contact a health care provider if: °· You keep feeling nauseous or you keep vomiting. °· You feel light-headed. °· You develop a rash. °· You have a fever. °Get help right away if: °· You have trouble breathing. °This information is not intended to replace advice   given to you by your health care provider. Make sure you discuss any questions you have with your health care provider. °Document Released: 04/14/2013 Document Revised: 11/27/2015 Document Reviewed: 10/14/2015 °Elsevier Interactive Patient Education © 2018 [employer] ° °

## 2017-08-19 NOTE — Interval H&P Note (Signed)
History and Physical Interval Note:  08/19/2017 8:08 AM  Suzanne Graham  has presented today for surgery, with the diagnosis of STROKE  The various methods of treatment have been discussed with the patient and family. After consideration of risks, benefits and other options for treatment, the patient has consented to  Procedure(s): TRANSESOPHAGEAL ECHOCARDIOGRAM (TEE) (N/A) as a surgical intervention .  The patient's history has been reviewed, patient examined, no change in status, stable for surgery.  I have reviewed the patient's chart and labs.  Questions were answered to the patient's satisfaction.     Dietrich PatesPaula Ross

## 2017-08-19 NOTE — CV Procedure (Signed)
TEE  LA, LA appendage, RA without masses No PFO by color doppler or as tested with injection of agitated saline  TV normal  Mild TR AV mildly thickened  Tr AI MV normal  No MR PV normal  No PI LVEF and RVEF normal NOrmal thoracic aorta   Full report to follow

## 2017-08-19 NOTE — Interval H&P Note (Signed)
History and Physical Interval Note:  08/19/2017 7:22 AM  Suzanne Graham  has presented today for surgery, with the diagnosis of stroke  The various methods of treatment have been discussed with the patient and family. After consideration of risks, benefits and other options for treatment, the patient has consented to transesophageal echo and implantable loop recorder placement as a surgical intervention .  The patient's history has been reviewed, patient examined, no change in status, stable for surgery.  I have reviewed the patient's chart and labs.  Questions were answered to the patient's satisfaction.     Hillis RangeJames Tamirah George

## 2017-08-20 ENCOUNTER — Encounter (HOSPITAL_COMMUNITY): Payer: Self-pay | Admitting: Internal Medicine

## 2017-08-26 ENCOUNTER — Ambulatory Visit: Payer: Self-pay | Admitting: Speech Pathology

## 2017-08-28 ENCOUNTER — Ambulatory Visit: Payer: Self-pay | Attending: Family Medicine

## 2017-08-28 ENCOUNTER — Other Ambulatory Visit: Payer: Self-pay

## 2017-08-28 DIAGNOSIS — R41841 Cognitive communication deficit: Secondary | ICD-10-CM | POA: Insufficient documentation

## 2017-08-28 NOTE — Therapy (Signed)
[facility] [address] [place], [place], [address] Phone: [phone]   Fax:  [phone]  Speech Language Pathology Evaluation  Patient Details  Name: Suzanne Graham MRN: [id number] Date of Birth: [birth year] Referring Provider: Delia Heady, MD   Encounter Date: 08/28/2017  End of Session - 08/28/17 1714    Visit Number  1    Number of Visits  5    Date for SLP Re-Evaluation  10/03/17    SLP Start Time  1404    SLP Stop Time   1440    SLP Time Calculation (min)  36 min    Activity Tolerance  Patient tolerated treatment well       Past Medical History:  Diagnosis Date  . Asthma   . Depression   . Gout   . Hypertension   . Stroke [facility]     Past Surgical History:  Procedure Laterality Date  . LOOP RECORDER INSERTION N/A 08/19/2017   Procedure: LOOP RECORDER INSERTION;  Surgeon: Hillis Range, MD;  Location: [facility] INVASIVE CV LAB;  Service: Cardiovascular;  Laterality: N/A;  . TEE WITHOUT CARDIOVERSION N/A 08/19/2017   Procedure: TRANSESOPHAGEAL ECHOCARDIOGRAM (TEE);  Surgeon: Pricilla Riffle, MD;  Location: [facility] ENDOSCOPY;  Service: Cardiovascular;  Laterality: N/A;    There were no vitals filed for this visit.  Subjective Assessment - 08/28/17 1414    Subjective  "Memory stuff, I guess." (re: what home health SLP was doing with pt) "My vision, and remembering certain things."    Currently in Pain?  Yes    Pain Score  4     Pain Location  Foot    Pain Orientation  Right    Pain Descriptors / Indicators  Shooting    Pain Onset  In the past 7 days    Pain Frequency  Intermittent    Aggravating Factors   walking    Pain Relieving Factors  meds         SLP Evaluation [facility] - 08/28/17 1414      SLP Visit Information   SLP Received On  08/28/17    Referring Provider  Delia Heady, MD    Onset Date  "Late December of last year"    Medical Diagnosis  CVA      General Information   HPI  SLE done on acute  on 07-18-17 indicating mild-mod cognitive linguistic deficits mainly with storage/retrieval, but also decr'd safety awareness, executive function, emergent awareness, and functional problem solving. Pt reports she has incr'd difficulty with short term memory, and that HHST was working on [employer], with a Glass blower/designer that pt states she is using for remembering things. "It's a lot better now than it was," pt stated.      Balance Screen   Has the patient fallen in the past 6 months  No      Prior Functional Status   Cognitive/Linguistic Baseline  Within functional limits      Cognition   Overall Cognitive Status  Impaired/Different from baseline    Area of Impairment  Memory    Memory  Decreased short-term memory    Memory Comments  Hopkins Verbal Learning Test: RECALL- 6/12 (WNL), 7/12 (below WNL), 7/12 (below WNL); RECOGNITION- 12/12 (WNL)      Auditory Comprehension   Overall Auditory Comprehension  Appears within functional limits for tasks assessed      Verbal Expression   Overall Verbal Expression  Appears within functional limits for tasks assessed  Oral Motor/Sensory Function   Overall Oral Motor/Sensory Function  Appears within functional limits for tasks assessed      Motor Speech   Overall Motor Speech  Appears within functional limits for tasks assessed      SLP informally assessed organization, reasoning, executive function in conversation today and this SLP feels pt's cognitive linguistic skills are WFL/WNL at this time. Further assessment may be necessary once pt completes a wider variety of therapy tasks.                SLP Education - 08/28/17 1713    Education provided  Yes    Education Details  results of ST eval, x1/week x4 weeks to review/eval effectiveness of compensations for awareness, might not go all 4 weeks    Person(s) Educated  Patient    Methods  Explanation    Comprehension  Verbalized understanding         SLP Long  Term Goals - 08/28/17 1718      SLP LONG TERM GOAL #1   Title  pt will report successful use of a memory strategy between two therapy sessions    Time  4    Period  Weeks or 4 visits    Status  New      SLP LONG TERM GOAL #2   Title  pt will demo [facility] memory skills in two sessions using compensations for memory    Time  4    Period  Weeks or 4 visits    Status  New      SLP LONG TERM GOAL #3   Title  pt will tell SLP 4 memory strategies she could use with modified independence    Time  4    Period  Weeks or 4 visits       Plan - 08/28/17 1716    Clinical Impression Statement  Pt reports her memory has improved as has her overall cognitive linguistic ability, since CVA. Pt appears to have a memory deficit affecting short term memory. Pt also reports deficits in peripheral vision. Pt with below WNL score on Hopkins Verbal Learning Test, however pt states the compensations she is using for decr'd memory are working well for her. She agrees that a short course of ST may be beneficial to reinforce her use of compensatory strategies in order to improve her memory.     Speech Therapy Frequency  1x /week    Duration  4 weeks    Treatment/Interventions  Compensatory techniques;Internal/external aids;Patient/family education;SLP instruction and feedback    Potential to Achieve Goals  Good       Patient will benefit from skilled therapeutic intervention in order to improve the following deficits and impairments:   Cognitive communication deficit    Problem List Patient Active Problem List   Diagnosis Date Noted  . Chronic nonintractable headache 08/06/2017  . Essential hypertension 08/06/2017  . Stroke [facility](H[facility]) 07/17/2017    [facility] ScottCHINKE,Kaylynne Andres ,MS, CCC-SLP  08/28/2017, 5:21 PM  [facility] [address] [place], [place], [address] Phone: [phone]   Fax:  [phone]  Name: Suzanne Graham MRN: [id number] Date of Birth:  05/31/1981

## 2017-09-03 ENCOUNTER — Ambulatory Visit: Payer: Self-pay

## 2017-09-04 ENCOUNTER — Encounter: Payer: Self-pay | Admitting: Internal Medicine

## 2017-09-05 ENCOUNTER — Ambulatory Visit: Payer: Self-pay | Attending: Family Medicine | Admitting: Speech Pathology

## 2017-09-05 DIAGNOSIS — R41841 Cognitive communication deficit: Secondary | ICD-10-CM | POA: Insufficient documentation

## 2017-09-08 MED FILL — LISINOPRIL-HCTZ 20-12.5 MG: 20-12.5 | 30 days supply | Qty: 30 | Fill #1

## 2017-09-09 ENCOUNTER — Ambulatory Visit (INDEPENDENT_AMBULATORY_CARE_PROVIDER_SITE_OTHER): Payer: Self-pay | Admitting: Family Medicine

## 2017-09-09 ENCOUNTER — Encounter: Payer: Self-pay | Admitting: Family Medicine

## 2017-09-09 ENCOUNTER — Ambulatory Visit: Payer: Self-pay

## 2017-09-09 VITALS — BP 122/74 | HR 80 | Temp 97.9°F | Ht 64.0 in | Wt 215.0 lb

## 2017-09-09 DIAGNOSIS — I639 Cerebral infarction, unspecified: Secondary | ICD-10-CM

## 2017-09-09 DIAGNOSIS — R51 Headache: Secondary | ICD-10-CM

## 2017-09-09 DIAGNOSIS — R519 Headache, unspecified: Secondary | ICD-10-CM

## 2017-09-09 DIAGNOSIS — F17200 Nicotine dependence, unspecified, uncomplicated: Secondary | ICD-10-CM

## 2017-09-09 DIAGNOSIS — I10 Essential (primary) hypertension: Secondary | ICD-10-CM

## 2017-09-09 DIAGNOSIS — R7989 Other specified abnormal findings of blood chemistry: Secondary | ICD-10-CM

## 2017-09-09 DIAGNOSIS — M1A0710 Idiopathic chronic gout, right ankle and foot, without tophus (tophi): Secondary | ICD-10-CM

## 2017-09-09 MED ORDER — ASPIRIN 325 MG PO TABS: 325.0000 mg | ORAL_TABLET | Freq: Every day | ORAL | 0 refills | Status: DC

## 2017-09-09 MED ORDER — LISINOPRIL-HYDROCHLOROTHIAZIDE 20-12.5 MG PO TABS: 1.0000 | ORAL_TABLET | Freq: Every day | ORAL | 0 refills | Status: DC

## 2017-09-09 MED ORDER — ALLOPURINOL 100 MG PO TABS: 100.0000 mg | ORAL_TABLET | Freq: Every day | ORAL | 6 refills | Status: DC

## 2017-09-09 MED ORDER — ALBUTEROL SULFATE HFA 108 (90 BASE) MCG/ACT IN AERS: 2.0000 | INHALATION_SPRAY | RESPIRATORY_TRACT | 1 refills | Status: DC | PRN

## 2017-09-09 MED ORDER — ATORVASTATIN CALCIUM 20 MG PO TABS: 20.0000 mg | ORAL_TABLET | Freq: Every day | ORAL | 1 refills | Status: DC

## 2017-09-09 MED FILL — ALLOPURINOL 100 MG TABLET: 100 | 30 days supply | Qty: 30 | Fill #0

## 2017-09-09 NOTE — Patient Instructions (Signed)
Gout Gout is painful swelling that can happen in some of your joints. Gout is a type of arthritis. This condition is caused by having too much uric acid in your body. Uric acid is a chemical that is made when your body breaks down substances called purines. If your body has too much uric acid, sharp crystals can form and build up in your joints. This causes pain and swelling. Gout attacks can happen quickly and be very painful (acute gout). Over time, the attacks can affect more joints and happen more often (chronic gout). Follow these instructions at home: During a Gout Attack  If directed, put ice on the painful area: ? Put ice in a plastic bag. ? Place a towel between your skin and the bag. ? Leave the ice on for 20 minutes, 2-3 times a day.  Rest the joint as much as possible. If the joint is in your leg, you may be given crutches to use.  Raise (elevate) the painful joint above the level of your heart as often as you can.  Drink enough fluids to keep your pee (urine) clear or pale yellow.  Take over-the-counter and prescription medicines only as told by your doctor.  Do not drive or use heavy machinery while taking prescription pain medicine.  Follow instructions from your doctor about what you can or cannot eat and drink.  Return to your normal activities as told by your doctor. Ask your doctor what activities are safe for you. Avoiding Future Gout Attacks  Follow a low-purine diet as told by a specialist (dietitian) or your doctor. Avoid foods and drinks that have a lot of purines, such as: ? Liver. ? Kidney. ? Anchovies. ? Asparagus. ? Herring. ? Mushrooms ? Mussels. ? Beer.  Limit alcohol intake to no more than 1 drink a day for nonpregnant women and 2 drinks a day for men. One drink equals 12 oz of beer, 5 oz of wine, or 1 oz of hard liquor.  Stay at a healthy weight or lose weight if you are overweight. If you want to lose weight, talk with your doctor. It is  important that you do not lose weight too fast.  Start or continue an exercise plan as told by your doctor.  Drink enough fluids to keep your pee clear or pale yellow.  Take over-the-counter and prescription medicines only as told by your doctor.  Keep all follow-up visits as told by your doctor. This is important. Contact a doctor if:  You have another gout attack.  You still have symptoms of a gout attack after10 days of treatment.  You have problems (side effects) because of your medicines.  You have chills or a fever.  You have burning pain when you pee (urinate).  You have pain in your lower back or belly. Get help right away if:  You have very bad pain.  Your pain cannot be controlled.  You cannot pee. This information is not intended to replace advice given to you by your health care provider. Make sure you discuss any questions you have with your health care provider. Document Released: 04/02/2008 Document Revised: 11/30/2015 Document Reviewed: 04/06/2015 Elsevier Interactive Patient Education  2018 Elsevier Inc. Low-Purine Diet Purines are compounds that affect the level of uric acid in your body. A low-purine diet is a diet that is low in purines. Eating a low-purine diet can prevent the level of uric acid in your body from getting too high and causing gout or kidney stones   or both. What do I need to know about this diet?  Choose low-purine foods. Examples of low-purine foods are listed in the next section.  Drink plenty of fluids, especially water. Fluids can help remove uric acid from your body. Try to drink 8-16 cups (1.9-3.8 L) a day.  Limit foods high in fat, especially saturated fat, as fat makes it harder for the body to get rid of uric acid. Foods high in saturated fat include pizza, cheese, ice cream, whole milk, fried foods, and gravies. Choose foods that are lower in fat and lean sources of protein. Use olive oil when cooking as it contains healthy fats  that are not high in saturated fat.  Limit alcohol. Alcohol interferes with the elimination of uric acid from your body. If you are having a gout attack, avoid all alcohol.  Keep in mind that different people's bodies react differently to different foods. You will probably learn over time which foods do or do not affect you. If you discover that a food tends to cause your gout to flare up, avoid eating that food. You can more freely enjoy foods that do not cause problems. If you have any questions about a food item, talk to your dietitian or health care provider. Which foods are low, moderate, and high in purines? The following is a list of foods that are low, moderate, and high in purines. You can eat any amount of the foods that are low in purines. You may be able to have small amounts of foods that are moderate in purines. Ask your health care provider how much of a food moderate in purines you can have. Avoid foods high in purines. Grains  Foods low in purines: Enriched white bread, pasta, rice, cake, cornbread, popcorn.  Foods moderate in purines: Whole-grain breads and cereals, wheat germ, bran, oatmeal. Uncooked oatmeal. Dry wheat bran or wheat germ.  Foods high in purines: Pancakes, French toast, biscuits, muffins. Vegetables  Foods low in purines: All vegetables, except those that are moderate in purines.  Foods moderate in purines: Asparagus, cauliflower, spinach, mushrooms, green peas. Fruits  All fruits are low in purines. Meats and other Protein Foods  Foods low in purines: Eggs, nuts, peanut butter.  Foods moderate in purines: 80-90% lean beef, lamb, veal, pork, poultry, fish, eggs, peanut butter, nuts. Crab, lobster, oysters, and shrimp. Cooked dried beans, peas, and lentils.  Foods high in purines: Anchovies, sardines, herring, mussels, tuna, codfish, scallops, trout, and haddock. Bacon. Organ meats (such as liver or kidney). Tripe. Game meat. Goose.  Sweetbreads. Dairy  All dairy foods are low in purines. Low-fat and fat-free dairy products are best because they are low in saturated fat. Beverages  Drinks low in purines: Water, carbonated beverages, tea, coffee, cocoa.  Drinks moderate in purines: Soft drinks and other drinks sweetened with high-fructose corn syrup. Juices. To find whether a food or drink is sweetened with high-fructose corn syrup, look at the ingredients list.  Drinks high in purines: Alcoholic beverages (such as beer). Condiments  Foods low in purines: Salt, herbs, olives, pickles, relishes, vinegar.  Foods moderate in purines: Butter, margarine, oils, mayonnaise. Fats and Oils  Foods low in purines: All types, except gravies and sauces made with meat.  Foods high in purines: Gravies and sauces made with meat. Other Foods  Foods low in purines: Sugars, sweets, gelatin. Cake. Soups made without meat.  Foods moderate in purines: Meat-based or fish-based soups, broths, or bouillons. Foods and drinks sweetened with high-fructose   corn syrup.  Foods high in purines: High-fat desserts (such as ice cream, cookies, cakes, pies, doughnuts, and chocolate). Contact your dietitian for more information on foods that are not listed here. This information is not intended to replace advice given to you by your health care provider. Make sure you discuss any questions you have with your health care provider. Document Released: 10/19/2010 Document Revised: 11/30/2015 Document Reviewed: 05/31/2013 Elsevier Interactive Patient Education  2017 Elsevier Inc.  

## 2017-09-09 NOTE — Progress Notes (Signed)
Patient ID: Suzanne Graham, female    DOB: 1980-10-14, 37 y.o.   MRN: [id number]  PCP: Bing Neighbors, FNP  Chief Complaint  Patient presents with  . Follow-up    6 weeks on blood pressure and headaches    Subjective:  HPI Suzanne Graham is a 37 y.o. female with HTN, CVA, right ankle gout,  presents for six week follow-up for hypertension and headaches.  General reports that she feels overall improved since her last office visit.  Her headaches have completely resolved.  She has had her blood pressure checked a couple times during outpatient rehab visits and blood pressure has remained stable.  She reports compliance with medication therapy and denies any known side effects from medications.  She has been negative of chest pain, shortness of breath, new weakness, or dizziness.   She complains today of ongoing intermittent right ankle pain secondary to gout.  She has had gout in her right ankle for quite some time for which she has managed with chronic NSAIDs.  Denies any previous use of colchicine and or allopurinol.  She admits that diet may influence the recurrence of gout exacerbation.  She also continues to smoke however is making efforts to achieve smoking cessation.  At present she is down to half a pack per day and sometimes less.  She denies chronic cough, wheezing, chest tightness.   Social History   Socioeconomic History  . Marital status: Single    Spouse name: Not on file  . Number of children: Not on file  . Years of education: Not on file  . Highest education level: Not on file  Social Needs  . Financial resource strain: Not on file  . Food insecurity - worry: Not on file  . Food insecurity - inability: Not on file  . Transportation needs - medical: Not on file  . Transportation needs - non-medical: Not on file  Occupational History  . Not on file  Tobacco Use  . Smoking status: Current Every Day Smoker    Packs/day: 0.25    Types: Cigarettes  . Smokeless tobacco:  Never Used  Substance and Sexual Activity  . Alcohol use: Yes    Comment: occasinally  . Drug use: No  . Sexual activity: Not on file  Other Topics Concern  . Not on file  Social History Narrative  . Not on file    Family History  Problem Relation Age of Onset  . Heart failure Mother   . Hypertension Mother   . Stroke Neg Hx    Review of Systems Pertinent negatives indicated in HPI. Patient Active Problem List   Diagnosis Date Noted  . Chronic nonintractable headache 08/06/2017  . Essential hypertension 08/06/2017  . Stroke (HCC) 07/17/2017    No Known Allergies  Prior to Admission medications   Medication Sig Start Date End Date Taking? Authorizing Provider  albuterol (PROVENTIL HFA;VENTOLIN HFA) 108 (90 Base) MCG/ACT inhaler Inhale 2 puffs into the lungs every 4 (four) hours as needed for wheezing or shortness of breath.   Yes [provider]  aspirin 325 MG tablet Take 1 tablet (325 mg total) by mouth daily. 07/19/17  Yes Mikhail, Nita Sells, DO  atorvastatin (LIPITOR) 20 MG tablet Take 1 tablet (20 mg total) by mouth daily at 6 PM. 07/29/17  Yes Bing Neighbors, FNP  lisinopril-hydrochlorothiazide (ZESTORETIC) 20-12.5 MG tablet Take 1 tablet by mouth daily. 08/06/17 11/04/17 Yes Bing Neighbors, FNP  hydrochlorothiazide (HYDRODIURIL) 25 MG tablet Take 25 mg  by mouth daily.    [provider]    Past Medical, Surgical Family and Social History reviewed and updated.    Objective:   Today's Vitals   09/09/17 0841  BP: 122/74  Pulse: 80  Temp: 97.9 F (36.6 C)  TempSrc: Oral  SpO2: 98%  Weight: 215 lb (97.5 kg)  Height: 5\' 4"  (1.626 m)    Wt Readings from Last 3 Encounters:  09/09/17 215 lb (97.5 kg)  08/19/17 215 lb (97.5 kg)  08/13/17 217 lb 6.4 oz (98.6 kg)    Physical Exam Constitutional: Patient appears well-developed and well-nourished. No distress. HENT: Normocephalic, atraumatic, External right and left ear normal. Oropharynx is  clear and moist.  Eyes: Conjunctivae and EOM are normal. PERRLA, no scleral icterus, Impaired distant vision. Neck: Normal ROM. Neck supple. No JVD. No tracheal deviation. No thyromegaly. CVS: RRR, S1/S2 +, no murmurs, no gallops, no carotid bruit.  Pulmonary: Effort and breath sounds normal, no stridor, rhonchi, wheezes, rales.  Abdominal: Soft. BS +, no distension, tenderness, rebound or guarding.  Musculoskeletal: Normal range of motion. No edema and no tenderness.  Lymphadenopathy: Negative of lymphadenopathy-cervical  Neuro: Alert. Normal reflexes, muscle tone coordination. No cranial nerve deficit. Skin: Skin is warm and dry. No rash noted. Not diaphoretic. No erythema. No pallor. Psychiatric: Normal mood and affect. Behavior, judgment, thought content normal.   Assessment & Plan:  1. Cerebrovascular accident (CVA), unspecified mechanism (HCC), continue follow-up with rehab at the stroke clinic.  Continue Lipitor as prescribed.  Advised to continue consistent taking of blood pressure medication as it is imperative to continue good management of blood pressure in order to prevent another stroke.  2. Essential hypertension, Controlled, stable. We have discussed target BP range and blood pressure goal. I have advised patient to check BP regularly and to call us back or report to clinic if the numbers are consistently higher than 140/90. We discussed the importance of compliance with medical therapy and DASH diet recommended, consequences of uncontrolled hypertension discussed. Continue current BP medications  3. Elevated serum creatinine, 1.32 07/29/17. Repeating CMP today.  4. Chronic gout of right ankle, unspecified cause, will trial a course of allopurinol 100 mg once daily.  Advised patient to take at least for 90 days consecutive daily in order to evaluate the absence of gout flares.  5. Current smoker, continue efforts for smoking cessation.  6. Chronic nonintractable headache,  unspecified headache type, resolved with good blood pressure control.  Meds ordered this encounter  Medications  . atorvastatin (LIPITOR) 20 MG tablet    Sig: Take 1 tablet (20 mg total) by mouth daily at 6 PM.    Dispense:  90 tablet    Refill:  1    Pick up as needed    Order Specific Question:   Supervising Provider    Answer:   Quentin Angst [id number]  . lisinopril-hydrochlorothiazide (ZESTORETIC) 20-12.5 MG tablet    Sig: Take 1 tablet by mouth daily.    Dispense:  90 tablet    Refill:  0    Pick up needed    Order Specific Question:   Supervising Provider    Answer:   Quentin Angst [id number]  . aspirin 325 MG tablet    Sig: Take 1 tablet (325 mg total) by mouth daily.    Dispense:  30 tablet    Refill:  0    Order Specific Question:   Supervising Provider    Answer:   Jeanann Lewandowsky  E [[id number]]  . albuterol (PROVENTIL HFA;VENTOLIN HFA) 108 (90 Base) MCG/ACT inhaler    Sig: Inhale 2 puffs into the lungs every 4 (four) hours as needed for wheezing or shortness of breath.    Dispense:  1 Inhaler    Refill:  1    Order Specific Question:   Supervising Provider    Answer:   Quentin AngstJEGEDE, OLUGBEMIGA E [id number]]  . allopurinol (ZYLOPRIM) 100 MG tablet    Sig: Take 1 tablet (100 mg total) by mouth daily.    Dispense:  30 tablet    Refill:  6    Order Specific Question:   Supervising Provider    Answer:   Quentin AngstJEGEDE, OLUGBEMIGA E [[id number]]   RTC: 6 months chronic condition management     Suzanne PickKimberly S. Tiburcio PeaHarris, MSN, FNP-C [facility]  [address] [place], [place] [address] [phone]

## 2017-09-10 ENCOUNTER — Encounter: Payer: Self-pay | Admitting: Internal Medicine

## 2017-09-10 LAB — COMPREHENSIVE METABOLIC PANEL
ALBUMIN: 4.1 g/dL (ref 3.5–5.5)
ALK PHOS: 54 IU/L (ref 39–117)
ALT: 17 IU/L (ref 0–32)
AST: 21 IU/L (ref 0–40)
Albumin/Globulin Ratio: 1.2 (ref 1.2–2.2)
BILIRUBIN TOTAL: 0.4 mg/dL (ref 0.0–1.2)
BUN / CREAT RATIO: 11 (ref 9–23)
BUN: 14 mg/dL (ref 6–20)
CHLORIDE: 96 mmol/L (ref 96–106)
CO2: 25 mmol/L (ref 20–29)
Calcium: 9.7 mg/dL (ref 8.7–10.2)
Creatinine, Ser: 1.31 mg/dL — ABNORMAL HIGH (ref 0.57–1.00)
GFR calc Af Amer: 59 mL/min/{1.73_m2} — ABNORMAL LOW (ref >59)
GFR calc non Af Amer: 51 mL/min/{1.73_m2} — ABNORMAL LOW (ref >59)
GLUCOSE: 92 mg/dL (ref 65–99)
Globulin, Total: 3.5 g/dL (ref 1.5–4.5)
Potassium: 4.2 mmol/L (ref 3.5–5.2)
SODIUM: 137 mmol/L (ref 134–144)
Total Protein: 7.6 g/dL (ref 6.0–8.5)

## 2017-09-16 ENCOUNTER — Ambulatory Visit: Payer: Self-pay | Admitting: Speech Pathology

## 2017-09-16 ENCOUNTER — Telehealth: Payer: Self-pay | Admitting: Family Medicine

## 2017-09-16 DIAGNOSIS — R7989 Other specified abnormal findings of blood chemistry: Secondary | ICD-10-CM

## 2017-09-16 DIAGNOSIS — R799 Abnormal finding of blood chemistry, unspecified: Principal | ICD-10-CM

## 2017-09-16 NOTE — Telephone Encounter (Signed)
Contact patient to advise recent labs indicated improving renal function. Electrolytes are within normal range. Recommend repeat renal function panel in 3 months. Lab pending in system

## 2017-09-17 NOTE — Telephone Encounter (Signed)
Called, no answer and voicemail was full.  

## 2017-09-17 NOTE — Telephone Encounter (Signed)
Patient notified and will come back to repeat labs

## 2017-09-18 ENCOUNTER — Encounter: Payer: Self-pay | Admitting: Speech Pathology

## 2017-09-18 ENCOUNTER — Ambulatory Visit (INDEPENDENT_AMBULATORY_CARE_PROVIDER_SITE_OTHER): Payer: Self-pay | Admitting: *Deleted

## 2017-09-18 ENCOUNTER — Ambulatory Visit: Payer: Self-pay | Admitting: Speech Pathology

## 2017-09-18 DIAGNOSIS — I639 Cerebral infarction, unspecified: Secondary | ICD-10-CM

## 2017-09-18 DIAGNOSIS — R41841 Cognitive communication deficit: Secondary | ICD-10-CM

## 2017-09-18 NOTE — Patient Instructions (Addendum)
Memory Compensation Strategies  1. Use "WARM" strategy. W= write it down A=  associate it R=  repeat it M=  make a mental picture  2. Use Notes your phone: Take notes in the note section - grocery lists, medications, questions for your doctor, anything you need to remember.  3. Use a calendar to write appointments down.  4. Use the Voice Memo on your phone for a quick way to get down something you need to remember when you are out and about   5. Use medication organizer with sections for each day or morning/evening pills  You may need help loading it  6. Keep a basket, or pegboard by the door.   Place items that you need to take out with you in the basket or on the pegboard.  You may also want to include a message board for reminders.  7. Use sticky notes. Place sticky notes with reminders in a place where the task is performed.  For example:  "turn off the stove" placed by the stove, "lock the door" placed on the door at eye level, "take your medications" on the bathroom mirror or by the place where you normally take your medications  8. Use alarms/timers.  Use while cooking to remind yourself to check on food or as a reminder to take your medicine, or as a reminder to make a call, or as a reminder to perform another task, etc.  9. Use you photos in your phone to keep track of schedule, calendar, important numbers/passwords, etc. Take a picture of your calendar filled out  Be sure to update and delete the old calendar photo as you add to it   Memory Strategies  W - Write it down  A - Associate it with something  R - Repeat it  M - Mental Image   Daily practice with your memory - 2x a day 15 minutes (at least)  Play the memory game  Try to remember 3-5 items on your store list without looking  Study a detailed picture in a magazine for 1 minute, then write down everything you can remember from the picture  Play Simon app  Memory game app  Use a story or mental  image to practice recalling the 5 words on the list - with a short delay  When you are going to a room for a purpose - repeat it over and over until you complete what you went in for

## 2017-09-18 NOTE — Progress Notes (Signed)
Carelink Summary Report / Loop Recorder 

## 2017-09-18 NOTE — Therapy (Signed)
[facility] [address] [place], [place], [address] Phone: [phone]   Fax:  [phone]  Speech Language Pathology Treatment  Patient Details  Name: Suzanne BentonShawnell Tedesco MRN: [id number] Date of Birth: 12/18/1980 Referring Provider: Delia HeadySethi, Pramod, MD   Encounter Date: 09/18/2017  End of Session - 09/18/17 1414    Visit Number  2    Number of Visits  5    Date for SLP Re-Evaluation  10/03/17    SLP Start Time  0933    SLP Stop Time   1015    SLP Time Calculation (min)  42 min    Activity Tolerance  Patient tolerated treatment well       Past Medical History:  Diagnosis Date  . Asthma   . Depression   . Gout   . Hypertension   . Stroke [facility](HCC)     Past Surgical History:  Procedure Laterality Date  . LOOP RECORDER INSERTION N/A 08/19/2017   Procedure: LOOP RECORDER INSERTION;  Surgeon: Hillis RangeAllred, James, MD;  Location: [facility] INVASIVE CV LAB;  Service: Cardiovascular;  Laterality: N/A;  . TEE WITHOUT CARDIOVERSION N/A 08/19/2017   Procedure: TRANSESOPHAGEAL ECHOCARDIOGRAM (TEE);  Surgeon: Pricilla Riffleoss, Paula V, MD;  [facility];  Service: Cardiovascular;  Laterality: N/A;    There were no vitals filed for this visit.  Subjective Assessment - 09/18/17 0937    Subjective  "I think it's getting better"    Currently in Pain?  No/denies            ADULT SLP TREATMENT - 09/18/17 0937      General Information   Behavior/Cognition  Alert;Cooperative;Pleasant mood      Treatment Provided   Treatment provided  Cognitive-Linquistic      Cognitive-Linquistic Treatment   Treatment focused on  Cognition    Skilled Treatment  Pt reports she is not writing down her appointments and needs to. Provided her blank calendars with instructions to keep on her living room table (where she organizies). Pt to bring back calendar completed. Trained pt in memory compensations including use of voice memo on her phone and note taking on  her phone. Also instructed pt to take a photo of her calendar on her phone. Pt trained in using making up a story or mental image of list of unrelated 5 words. She required mod A to complete this with slight delay/distraction of mental congnitive task (30 seconds to 1minute) pt recalled 2/5 words.       Assessment / Recommendations / Plan   Plan  Continue with current plan of care      Progression Toward Goals   Progression toward goals  Progressing toward goals       SLP Education - 09/18/17 1411    Education provided  Yes    Education Details  compensations for memory, memory activities to do at home, set up calendar         SLP Long Term Goals - 09/18/17 1413      SLP LONG TERM GOAL #1   Title  pt will report successful use of a memory strategy between two therapy sessions    Time  4    Period  Weeks or 4 visits    Status  On-going      SLP LONG TERM GOAL #2   Title  [facility] two sessions using compensations for memory    Time  4    Period  Weeks or  4 visits    Status  On-going      SLP LONG TERM GOAL #3   Title  pt will tell SLP 4 memory strategies she could use with modified independence    Time  4    Period  Weeks or 4 visits    Status  On-going       Plan - 09/18/17 1412    Clinical Impression Statement  Pt reports memory improving, however she still has difficulty remembering her appointments and difficulty remembering what she went into a room for. Initiated training in compensations for memory including use of a calendar and smart phone. Contiue skilled ST to maximize carryover of memory strategies.     Speech Therapy Frequency  1x /week    Duration  4 weeks    Treatment/Interventions  Compensatory techniques;Internal/external aids;Patient/family education;SLP instruction and feedback    Potential to Achieve Goals  Good       Patient will benefit from skilled therapeutic intervention in order to improve the following deficits and  impairments:   Cognitive communication deficit    Problem List Patient Active Problem List   Diagnosis Date Noted  . Chronic nonintractable headache 08/06/2017  . Essential hypertension 08/06/2017  . Stroke [facility]) 07/17/2017    Eden Toohey, Radene Journey MS, CCC-SLP 09/18/2017, 2:14 PM  [facility] [address] [place], [place], [address] Phone: [phone]   Fax:  [phone]   Name: Suzanne Graham MRN: [id number] Date of Birth: Aug 14, 1980

## 2017-09-23 ENCOUNTER — Telehealth: Payer: Self-pay

## 2017-09-23 ENCOUNTER — Ambulatory Visit: Payer: Self-pay | Admitting: Neurology

## 2017-09-23 NOTE — Telephone Encounter (Signed)
Rn call the toll free number at [phone] to inquire if they receive the OT orders sign that were fax to [phone]. The customer service rep stated she does not see any of the faxes that were sent from our office. She states they are sent their computer fax. She gave another number of [phone]. Rn stated that the fax number should be provided on the OT form that's sign to make sure they receive it. Rn fax OT order that was sign by Dr Roda ShuttersXu to [phone], and it was receive twice.

## 2017-09-23 NOTE — Telephone Encounter (Signed)
Rn call [facility]Jennifer at [facility] that the OT orders for Dr.Xu were fax twice to the listed number of [phone] that they provided. RN stated it was fax yesterday at around 1804, and confirmation was receive. Victorino DikeJennifer stated they did not get the fax. Rn fax the sign order 3 times to the number they provided of [phone]. The sign OT order was confirmed three times via fax.

## 2017-09-24 ENCOUNTER — Telehealth: Payer: Self-pay | Admitting: Cardiology

## 2017-09-24 ENCOUNTER — Ambulatory Visit: Payer: Self-pay

## 2017-09-24 NOTE — Telephone Encounter (Signed)
Spoke w/ pt and requested that she send a manual transmission b/c her home monitor has not updated in at least 14 days.   

## 2017-10-01 ENCOUNTER — Ambulatory Visit: Payer: Self-pay

## 2017-10-02 ENCOUNTER — Ambulatory Visit: Payer: Self-pay | Admitting: Neurology

## 2017-10-02 ENCOUNTER — Telehealth: Payer: Self-pay

## 2017-10-02 ENCOUNTER — Encounter: Payer: Self-pay | Admitting: Neurology

## 2017-10-02 NOTE — Telephone Encounter (Signed)
Patient no show for appt today. 

## 2017-10-09 MED FILL — PROAIR HFA 90 MCG INHALER: 108 (90 BAS | 16 days supply | Qty: 9 | Fill #0

## 2017-10-09 MED FILL — LISINOPRIL-HCTZ 20-12.5 TAB: 20-12.5 | 30 days supply | Qty: 30 | Fill #2

## 2017-10-18 ENCOUNTER — Emergency Department (HOSPITAL_COMMUNITY): Payer: Self-pay

## 2017-10-18 ENCOUNTER — Other Ambulatory Visit: Payer: Self-pay

## 2017-10-18 ENCOUNTER — Emergency Department (HOSPITAL_COMMUNITY)
Admission: EM | Admit: 2017-10-18 | Discharge: 2017-10-18 | Disposition: A | Discharge status: Home / Self Care | Payer: Self-pay | Attending: Emergency Medicine | Admitting: Emergency Medicine

## 2017-10-18 ENCOUNTER — Encounter (HOSPITAL_COMMUNITY): Payer: Self-pay | Admitting: Emergency Medicine

## 2017-10-18 DIAGNOSIS — M546 Pain in thoracic spine: Secondary | ICD-10-CM | POA: Insufficient documentation

## 2017-10-18 DIAGNOSIS — I10 Essential (primary) hypertension: Secondary | ICD-10-CM | POA: Insufficient documentation

## 2017-10-18 DIAGNOSIS — F17210 Nicotine dependence, cigarettes, uncomplicated: Secondary | ICD-10-CM | POA: Insufficient documentation

## 2017-10-18 DIAGNOSIS — J45909 Unspecified asthma, uncomplicated: Secondary | ICD-10-CM | POA: Insufficient documentation

## 2017-10-18 DIAGNOSIS — Z79899 Other long term (current) drug therapy: Secondary | ICD-10-CM | POA: Insufficient documentation

## 2017-10-18 MED ORDER — NAPROXEN 500 MG PO TABS: 500.0000 mg | ORAL_TABLET | Freq: Two times a day (BID) | ORAL | 0 refills | Status: DC

## 2017-10-18 MED ORDER — CYCLOBENZAPRINE HCL 10 MG PO TABS: 10.0000 mg | ORAL_TABLET | Freq: Two times a day (BID) | ORAL | 0 refills | Status: DC | PRN

## 2017-10-18 NOTE — Discharge Instructions (Addendum)
Rest and take the Naprosyn on a regular basis for the next 7 days.  Supplement with the Flexeril as a muscle relaxer.  Return for any new or worse symptoms.

## 2017-10-18 NOTE — ED Provider Notes (Signed)
[facility]ENC[facility] Provider Note   CSN: [id number] Arrival date & time: 10/18/17  [id number]     History   Chief Complaint Chief Complaint  Patient presents with  . Back Pain    HPI Suzanne Graham is a 37 y.o. female.  Patient with onset of lower thoracic back pain yesterday no history of any injury its bilateral.  Worse with movement.  May be a slight cough but no fever no upper respiratory infection.  No leg swelling.  No abdominal pain.  No lower back pain history of some back pains in the past patient thought may be this was musculoskeletal.  No lower extremity numbness or weakness.     Past Medical History:  Diagnosis Date  . Asthma   . Depression   . Gout   . Hypertension   . Stroke [facility](HCC)     Patient Active Problem List   Diagnosis Date Noted  . Chronic nonintractable headache 08/06/2017  . Essential hypertension 08/06/2017  . Stroke [facility](HCC) 07/17/2017    Past Surgical History:  Procedure Laterality Date  . LOOP RECORDER INSERTION N/A 08/19/2017   Procedure: LOOP RECORDER INSERTION;  Surgeon: Hillis RangeAllred, James, MD;  Location: [facility] INVAS[facility] CV LAB;  Service: Cardiovascular;  Laterality: N/A;  . TEE WITHOUT CARDIOVERSION N/A 08/19/2017   Procedure: TRANSESOPHAGEAL ECHOCARDIOGRAM (TEE);  Surgeon: Pricilla Riffleoss, Paula V, MD;  [facility];  Service: Cardiovascular;  Laterality: N/A;     OB History   None      Home Medications    Prior to Admission medications   Medication Sig Start Date End Date Taking? Authorizing Provider  albuterol (PROVENTIL HFA;VENTOLIN HFA) 108 (90 Base) MCG/ACT inhaler Inhale 2 puffs into the lungs every 4 (four) hours as needed for wheezing or shortness of breath. 09/09/17   Bing NeighborsHarris, Kimberly S, FNP  allopurinol (ZYLOPRIM) 100 MG tablet Take 1 tablet (100 mg total) by mouth daily. 09/09/17   Bing NeighborsHarris, Kimberly S, FNP  aspirin 325 MG tablet Take 1 tablet (325 mg total) by mouth daily. 09/09/17   Bing NeighborsHarris, Kimberly S, FNP    atorvastatin (LIPITOR) 20 MG tablet Take 1 tablet (20 mg total) by mouth daily at 6 PM. 09/09/17   Bing NeighborsHarris, Kimberly S, FNP  cyclobenzaprine (FLEXERIL) 10 MG tablet Take 1 tablet (10 mg total) by mouth 2 (two) times daily as needed for muscle spasms. 10/18/17   Vanetta MuldersZackowski, Shirley Decamp, MD  hydrochlorothiazide (HYDRODIURIL) 25 MG tablet Take 25 mg by mouth daily.    [provider]  lisinopril-hydrochlorothiazide (ZESTORETIC) 20-12.5 MG tablet Take 1 tablet by mouth daily. 09/09/17 12/08/17  Bing NeighborsHarris, Kimberly S, FNP  naproxen (NAPROSYN) 500 MG tablet Take 1 tablet (500 mg total) by mouth 2 (two) times daily. 10/18/17   Vanetta MuldersZackowski, Rani Idler, MD    Family History Family History  Problem Relation Age of Onset  . Heart failure Mother   . Hypertension Mother   . Stroke Neg Hx     Social History Social History   Tobacco Use  . Smoking status: Current Every Day Smoker    Packs/day: 0.25    Types: Cigarettes  . Smokeless tobacco: Never Used  Substance Use Topics  . Alcohol use: Yes    Comment: occasinally  . Drug use: No     Allergies   Patient has no known allergies.   Review of Systems Review of Systems  Constitutional: Negative for fever.  HENT: Negative for congestion.   Eyes: Negative for redness.  Respiratory: Positive for cough.  Negative for shortness of breath.   Cardiovascular: Negative for chest pain.  Gastrointestinal: Negative for abdominal pain.  Genitourinary: Negative for dysuria.  Musculoskeletal: Positive for back pain. Negative for neck pain.  Skin: Negative for rash.  Neurological: Negative for weakness, numbness and headaches.  Hematological: Does not bruise/bleed easily.  Psychiatric/Behavioral: Negative for confusion.     Physical Exam Updated Vital Signs BP 100/75 (BP Location: Left Arm)   Pulse 60   Temp 98.2 F (36.8 C) (Oral)   Resp 14   Ht 1.626 m (5\' 4" )   Wt 99.8 kg (220 lb)   LMP 09/06/2017   SpO2 100%   BMI 37.76 kg/m   Physical Exam   Constitutional: She is oriented to person, place, and time. She appears well-developed and well-nourished. No distress.  HENT:  Head: Normocephalic and atraumatic.  Mouth/Throat: Oropharynx is clear and moist.  Eyes: Pupils are equal, round, and reactive to light. Conjunctivae and EOM are normal.  Neck: Normal range of motion. Neck supple.  Cardiovascular: Normal rate, regular rhythm and normal heart sounds.  Pulmonary/Chest: Effort normal and breath sounds normal. She has no wheezes.  Abdominal: Soft. Bowel sounds are normal. There is no tenderness.  Musculoskeletal: Normal range of motion. She exhibits no edema.  No tenderness to palpation over the thoracic or lumbar spine area.  Neurological: She is alert and oriented to person, place, and time. No cranial nerve deficit or sensory deficit. She exhibits normal muscle tone. Coordination normal.  Skin: Skin is warm.  Nursing note and vitals reviewed.    ED Treatments / Results  Labs (all labs ordered are listed, but only abnormal results are displayed) Labs Reviewed - No data to display  EKG None  Radiology Dg Chest 2 View  Result Date: 10/18/2017 CLINICAL DATA:  Bilateral flank pain for 2 days. EXAM: CHEST - 2 VIEW COMPARISON:  None. FINDINGS: Heart size and mediastinal contours are normal. Loop recorder overlies the LEFT chest. Lungs are clear. No pleural effusion or pneumothorax seen. Osseous structures about the chest are unremarkable. IMPRESSION: No active cardiopulmonary disease. No evidence of pneumonia or pulmonary edema. Electronically Signed   By: Bary Richard M.D.   On: 10/18/2017 11:47    Procedures Procedures (including critical care time)  Medications Ordered in ED Medications - No data to display   Initial Impression / Assessment and Plan / ED Course  I have reviewed the triage vital signs and the nursing notes.  Pertinent labs & imaging results that were available during my care of the patient were reviewed  by me and considered in my medical decision making (see chart for details).     Patient with lower thoracic back pain.  No lumbar back pain.  Made worse with movement.  Chest x-ray negative for any acute findings.  Will treat symptomatically with Flexeril and Naprosyn.  Patient not hypoxic not tachycardic no anterior chest pain.  No upper respiratory symptoms.  Final Clinical Impressions(s) / ED Diagnoses   Final diagnoses:  Acute bilateral thoracic back pain    ED Discharge Orders        Ordered    naproxen (NAPROSYN) 500 MG tablet  2 times daily     10/18/17 1315    cyclobenzaprine (FLEXERIL) 10 MG tablet  2 times daily PRN     10/18/17 1315       Vanetta Mulders, MD 10/18/17 1321

## 2017-10-18 NOTE — ED Triage Notes (Signed)
Pt. Stated, My back started hurting yesterday morning , hurts in the middle.

## 2017-10-21 ENCOUNTER — Ambulatory Visit (INDEPENDENT_AMBULATORY_CARE_PROVIDER_SITE_OTHER): Payer: Self-pay | Admitting: *Deleted

## 2017-10-21 DIAGNOSIS — I639 Cerebral infarction, unspecified: Secondary | ICD-10-CM

## 2017-10-21 NOTE — Progress Notes (Signed)
Carelink Summary Report / Loop Recorder 

## 2017-10-27 LAB — CUP PACEART REMOTE DEVICE CHECK
Date Time Interrogation Session: 20190314143907
Implantable Pulse Generator Implant Date: 20190212

## 2017-11-07 MED FILL — LISINOPRIL-HCTZ 20-12.5 MG: 20-12.5 | 30 days supply | Qty: 30 | Fill #0

## 2017-11-12 ENCOUNTER — Telehealth: Payer: Self-pay | Admitting: Cardiology

## 2017-11-12 NOTE — Telephone Encounter (Signed)
Spoke w/ pt and requested that she send a manual transmission b/c her home monitor has not updated in at least 14 days.   

## 2017-11-24 ENCOUNTER — Ambulatory Visit (INDEPENDENT_AMBULATORY_CARE_PROVIDER_SITE_OTHER): Payer: Self-pay | Admitting: *Deleted

## 2017-11-24 DIAGNOSIS — I639 Cerebral infarction, unspecified: Secondary | ICD-10-CM

## 2017-11-24 LAB — CUP PACEART REMOTE DEVICE CHECK
Date Time Interrogation Session: 20190416153842
MDC IDC PG IMPLANT DT: 20190212

## 2017-11-24 NOTE — Progress Notes (Signed)
Carelink Summary Report / Loop Recorder 

## 2017-12-11 MED FILL — LISINOPRIL-HCTZ 20-12.5 MG: 20-12.5 | 30 days supply | Qty: 30 | Fill #1

## 2017-12-15 LAB — CUP PACEART REMOTE DEVICE CHECK
Implantable Pulse Generator Implant Date: 20190212
MDC IDC SESS DTM: 20190519161032

## 2017-12-26 ENCOUNTER — Ambulatory Visit (INDEPENDENT_AMBULATORY_CARE_PROVIDER_SITE_OTHER): Payer: Self-pay | Admitting: *Deleted

## 2017-12-26 DIAGNOSIS — I639 Cerebral infarction, unspecified: Secondary | ICD-10-CM

## 2017-12-29 NOTE — Progress Notes (Signed)
Carelink Summary Report / Loop Recorder 

## 2018-01-13 ENCOUNTER — Emergency Department (HOSPITAL_COMMUNITY)
Admission: EM | Admit: 2018-01-13 | Discharge: 2018-01-13 | Disposition: A | Discharge status: Home / Self Care | Payer: Self-pay | Attending: Emergency Medicine | Admitting: Emergency Medicine

## 2018-01-13 ENCOUNTER — Encounter (HOSPITAL_COMMUNITY): Payer: Self-pay

## 2018-01-13 ENCOUNTER — Other Ambulatory Visit: Payer: Self-pay

## 2018-01-13 DIAGNOSIS — Z79899 Other long term (current) drug therapy: Secondary | ICD-10-CM | POA: Insufficient documentation

## 2018-01-13 DIAGNOSIS — F17210 Nicotine dependence, cigarettes, uncomplicated: Secondary | ICD-10-CM | POA: Insufficient documentation

## 2018-01-13 DIAGNOSIS — Z8673 Personal history of transient ischemic attack (TIA), and cerebral infarction without residual deficits: Secondary | ICD-10-CM | POA: Insufficient documentation

## 2018-01-13 DIAGNOSIS — I10 Essential (primary) hypertension: Secondary | ICD-10-CM | POA: Insufficient documentation

## 2018-01-13 DIAGNOSIS — T783XXA Angioneurotic edema, initial encounter: Secondary | ICD-10-CM | POA: Insufficient documentation

## 2018-01-13 DIAGNOSIS — J45909 Unspecified asthma, uncomplicated: Secondary | ICD-10-CM | POA: Insufficient documentation

## 2018-01-13 DIAGNOSIS — Z7982 Long term (current) use of aspirin: Secondary | ICD-10-CM | POA: Insufficient documentation

## 2018-01-13 MED ORDER — FAMOTIDINE IN NACL 20-0.9 MG/50ML-% IV SOLN
20.0000 mg | INTRAVENOUS | Status: AC
  Administered 2018-01-13: 20 mg via INTRAVENOUS
  Filled 2018-01-13: qty 50

## 2018-01-13 MED ORDER — METHYLPREDNISOLONE SODIUM SUCC 125 MG IJ SOLR
125.0000 mg | Freq: Once | INTRAMUSCULAR | Status: AC
  Administered 2018-01-13: 125 mg via INTRAVENOUS
  Filled 2018-01-13: qty 2

## 2018-01-13 MED ORDER — DIPHENHYDRAMINE HCL 50 MG/ML IJ SOLN
25.0000 mg | Freq: Once | INTRAMUSCULAR | Status: AC
  Administered 2018-01-13: 25 mg via INTRAVENOUS
  Filled 2018-01-13: qty 1

## 2018-01-13 MED ORDER — HYDROCHLOROTHIAZIDE 25 MG PO TABS: 25.0000 mg | ORAL_TABLET | Freq: Every day | ORAL | 0 refills | Status: DC

## 2018-01-13 MED FILL — HYDROCHLOROTHIAZIDE 25 MG T: 25 | 30 days supply | Qty: 30 | Fill #0

## 2018-01-13 NOTE — ED Provider Notes (Signed)
[facility] EMERGENCY DEPARTMENT Provider Note  CSN: [id number] Arrival date & time: 01/13/18 6962  Chief Complaint(s) Angioedema  HPI Suzanne Graham is a 37 y.o. female with a history of asthma and hypertension on lisinopril who presents to the emergency department with tongue swelling that she noted less than 1 hour prior to arrival.  Patient reports that she has had intermittent tongue swelling over the past 4 months.  States that she has been on lisinopril for 1 year.  The previous tongue swelling resolved spontaneously.  She reports that today's episode was more severe than the previous ones.  She endorsed difficulty swallowing due to the size of her tongue.  Denied any shortness of breath, difficulty breathing, or facial swelling.  Patient reports dry cough for 1 month.  No chest pain or shortness of breath.  No headache, nausea or vomiting.  No abdominal pain.  No extremity swelling.  She denies any recent fevers or infections.  Patient reports that she did not take anything for the swelling but states that it feels like it is improving.  The history is provided by the patient.    Past Medical History Past Medical History:  Diagnosis Date  . Asthma   . Depression   . Gout   . Hypertension   . Stroke [facility])    Patient Active Problem List   Diagnosis Date Noted  . Chronic nonintractable headache 08/06/2017  . Essential hypertension 08/06/2017  . Stroke (HCC) 07/17/2017   Home Medication(s) Prior to Admission medications   Medication Sig Start Date End Date Taking? Authorizing Provider  albuterol (PROVENTIL HFA;VENTOLIN HFA) 108 (90 Base) MCG/ACT inhaler Inhale 2 puffs into the lungs every 4 (four) hours as needed for wheezing or shortness of breath. 09/09/17  Yes Bing Neighbors, FNP  allopurinol (ZYLOPRIM) 100 MG tablet Take 1 tablet (100 mg total) by mouth daily. Patient taking differently: Take 100 mg by mouth daily as needed (gout pain).  09/09/17  Yes  Bing Neighbors, FNP  aspirin 325 MG tablet Take 1 tablet (325 mg total) by mouth daily. 09/09/17  Yes Bing Neighbors, FNP  cyclobenzaprine (FLEXERIL) 10 MG tablet Take 1 tablet (10 mg total) by mouth 2 (two) times daily as needed for muscle spasms. 10/18/17  Yes Vanetta Mulders, MD  lisinopril-hydrochlorothiazide (ZESTORETIC) 20-12.5 MG tablet Take 1 tablet by mouth daily. 09/09/17 01/13/18 Yes Bing Neighbors, FNP  atorvastatin (LIPITOR) 20 MG tablet Take 1 tablet (20 mg total) by mouth daily at 6 PM. Patient not taking: Reported on 01/13/2018 09/09/17   Bing Neighbors, FNP  hydrochlorothiazide (HYDRODIURIL) 25 MG tablet Take 1 tablet (25 mg total) by mouth daily. 01/13/18 02/12/18  Nira Conn, MD  naproxen (NAPROSYN) 500 MG tablet Take 1 tablet (500 mg total) by mouth 2 (two) times daily. Patient not taking: Reported on 01/13/2018 10/18/17   Vanetta Mulders, MD  Past Surgical History Past Surgical History:  Procedure Laterality Date  . LOOP RECORDER INSERTION N/A 08/19/2017   Procedure: LOOP RECORDER INSERTION;  Surgeon: Hillis Range, MD;  [facility] CV LAB;  Service: Cardiovascular;  Laterality: N/A;  . TEE WITHOUT CARDIOVERSION N/A 08/19/2017   Procedure: TRANSESOPHAGEAL ECHOCARDIOGRAM (TEE);  Surgeon: Pricilla Riffle, MD;  Location: [facility] ENDOSCOPY;  Service: Cardiovascular;  Laterality: N/A;   Family History Family History  Problem Relation Age of Onset  . Heart failure Mother   . Hypertension Mother   . Stroke Neg Hx     Social History Social History   Tobacco Use  . Smoking status: Current Every Day Smoker    Packs/day: 0.25    Types: Cigarettes  . Smokeless tobacco: Never Used  Substance Use Topics  . Alcohol use: Yes    Comment: occasinally  . Drug use: No   Allergies Patient has no known allergies.  Review of  Systems Review of Systems All other systems are reviewed and are negative for acute change except as noted in the HPI  Physical Exam Vital Signs  I have reviewed the triage vital signs BP (!) 145/95   Pulse 83   Temp 98 F (36.7 C) (Oral)   Resp 20   SpO2 95%   Physical Exam  Constitutional: She is oriented to person, place, and time. She appears well-developed and well-nourished. No distress.  HENT:  Head: Normocephalic and atraumatic.  Nose: Nose normal.  Mouth/Throat: No uvula swelling. No posterior oropharyngeal edema.    Eyes: Pupils are equal, round, and reactive to light. Conjunctivae and EOM are normal. Right eye exhibits no discharge. Left eye exhibits no discharge. No scleral icterus.  Neck: Normal range of motion. Neck supple.  Cardiovascular: Normal rate and regular rhythm. Exam reveals no gallop and no friction rub.  No murmur heard. Pulmonary/Chest: Effort normal and breath sounds normal. No stridor. No respiratory distress. She has no rales.  Abdominal: Soft. She exhibits no distension. There is no tenderness.  Musculoskeletal: She exhibits no edema or tenderness.  Neurological: She is alert and oriented to person, place, and time.  Skin: Skin is warm and dry. No rash noted. She is not diaphoretic. No erythema.  Psychiatric: She has a normal mood and affect.  Vitals reviewed.   ED Results and Treatments Labs (all labs ordered are listed, but only abnormal results are displayed) Labs Reviewed - No data to display                                                                                                                       EKG  EKG Interpretation  Date/Time:    Ventricular Rate:    PR Interval:    QRS Duration:   QT Interval:    QTC Calculation:   R Axis:     Text Interpretation:        Radiology No results found. Pertinent labs & imaging results that were available during my care of  the patient were reviewed by me and considered in my  medical decision making (see chart for details).  Medications Ordered in ED Medications  methylPREDNISolone sodium succinate (SOLU-MEDROL) 125 mg/2 mL injection 125 mg (125 mg Intravenous Given 01/13/18 0526)  famotidine (PEPCID) IVPB 20 mg premix (0 mg Intravenous Stopped 01/13/18 0557)  diphenhydrAMINE (BENADRYL) injection 25 mg (25 mg Intravenous Given 01/13/18 0553)                                                                                                                                    Procedures Procedures  (including critical care time)  Medical Decision Making / ED Course I have reviewed the nursing notes for this encounter and the patient's prior records (if available in EHR or on provided paperwork).    Angioedema likely ACE inhibitor induced.  No other triggers identified.  Involving the right side of the tongue only.  No other airway involvement.  No respiratory distress.  Given allergy cocktail.  Will require monitoring for 4 to 6 hours.  On reassessment 2 hours into her stay, patient reports improved swelling.  Patient care turned over to Dr Rubin PayorPickering at 0800. Patient case and results discussed in detail; please see their note for further ED managment.      Final Clinical Impression(s) / ED Diagnoses Final diagnoses:  Angioedema, initial encounter      This chart was dictated using voice recognition software.  Despite best efforts to proofread,  errors can occur which can change the documentation meaning.   Nira Connardama, Alvaro Aungst Eduardo, MD 01/13/18 [phone]

## 2018-01-13 NOTE — Discharge Instructions (Addendum)
Discontinued lisinopril use.  Please follow-up closely with your primary care provider.  We will start you on HCTZ for blood pressure. Your primary care provider will decide whether you need to remain on this blood pressure medicine

## 2018-01-13 NOTE — ED Triage Notes (Signed)
Pt states that she woke up with significant swelling to her tongue, on lisinopril, has not taken in two days. Denies SOB

## 2018-01-26 ENCOUNTER — Ambulatory Visit: Payer: Self-pay

## 2018-01-28 ENCOUNTER — Ambulatory Visit (INDEPENDENT_AMBULATORY_CARE_PROVIDER_SITE_OTHER): Payer: Self-pay | Admitting: *Deleted

## 2018-01-28 DIAGNOSIS — I639 Cerebral infarction, unspecified: Secondary | ICD-10-CM

## 2018-01-29 LAB — CUP PACEART REMOTE DEVICE CHECK
Implantable Pulse Generator Implant Date: 20190212
MDC IDC SESS DTM: 20190621163724

## 2018-01-29 NOTE — Progress Notes (Signed)
Carelink Summary Report / Loop Recorder 

## 2018-02-12 ENCOUNTER — Encounter (HOSPITAL_COMMUNITY): Payer: Self-pay | Admitting: *Deleted

## 2018-02-12 ENCOUNTER — Emergency Department (HOSPITAL_COMMUNITY)
Admission: EM | Admit: 2018-02-12 | Discharge: 2018-02-12 | Disposition: A | Discharge status: Home / Self Care | Payer: Self-pay | Attending: Emergency Medicine | Admitting: Emergency Medicine

## 2018-02-12 ENCOUNTER — Other Ambulatory Visit: Payer: Self-pay

## 2018-02-12 DIAGNOSIS — J45909 Unspecified asthma, uncomplicated: Secondary | ICD-10-CM | POA: Insufficient documentation

## 2018-02-12 DIAGNOSIS — Z8673 Personal history of transient ischemic attack (TIA), and cerebral infarction without residual deficits: Secondary | ICD-10-CM | POA: Insufficient documentation

## 2018-02-12 DIAGNOSIS — F17210 Nicotine dependence, cigarettes, uncomplicated: Secondary | ICD-10-CM | POA: Insufficient documentation

## 2018-02-12 DIAGNOSIS — Z79899 Other long term (current) drug therapy: Secondary | ICD-10-CM | POA: Insufficient documentation

## 2018-02-12 DIAGNOSIS — M549 Dorsalgia, unspecified: Secondary | ICD-10-CM | POA: Insufficient documentation

## 2018-02-12 DIAGNOSIS — I10 Essential (primary) hypertension: Secondary | ICD-10-CM | POA: Insufficient documentation

## 2018-02-12 NOTE — ED Provider Notes (Signed)
[facility] EMERGENCY DEPARTMENT Provider Note  CSN: [id number] Arrival date & time: 02/12/18  1231  History   Chief Complaint Chief Complaint  Patient presents with  . Back Pain   HPI Delona Clasby is a 37 y.o. female with a medical history of HTN and stroke who presented to the ED for back pain x1 week. She describes bilateral thoracic and low back pain which is aching. Denies recent falls, traumas or injuries. She reports starting a new job where she is doing a lot of bending and lifting heavy items. Denies neck pain, paresthesias, weakness, radiating pain, arthralgias and bowel/bladder dysfunction. Patient has tried ibuprofen prior to coming to the ED.  Past Medical History:  Diagnosis Date  . Asthma   . Depression   . Gout   . Hypertension   . Stroke [facility])     Patient Active Problem List   Diagnosis Date Noted  . Chronic nonintractable headache 08/06/2017  . Essential hypertension 08/06/2017  . Stroke [facility]) 07/17/2017    Past Surgical History:  Procedure Laterality Date  . LOOP RECORDER INSERTION N/A 08/19/2017   Procedure: LOOP RECORDER INSERTION;  Surgeon: Hillis Range, MD;  [facility]: [facility] INVASIVE CV LAB;  Service: Cardiovascular;  Laterality: N/A;  . TEE WITHOUT CARDIOVERSION N/A 08/19/2017   Procedure: TRANSESOPHAGEAL ECHOCARDIOGRAM (TEE);  Surgeon: Pricilla Riffle, MD;  Location: [facility] ENDOSCOPY;  Service: Cardiovascular;  Laterality: N/A;     OB History   None      Home Medications    Prior to Admission medications   Medication Sig Start Date End Date Taking? Authorizing Provider  albuterol (PROVENTIL HFA;VENTOLIN HFA) 108 (90 Base) MCG/ACT inhaler Inhale 2 puffs into the lungs every 4 (four) hours as needed for wheezing or shortness of breath. 09/09/17   Bing Neighbors, FNP  allopurinol (ZYLOPRIM) 100 MG tablet Take 1 tablet (100 mg total) by mouth daily. Patient taking differently: Take 100 mg by mouth daily as needed (gout pain).  09/09/17    Bing Neighbors, FNP  aspirin 325 MG tablet Take 1 tablet (325 mg total) by mouth daily. 09/09/17   Bing Neighbors, FNP  atorvastatin (LIPITOR) 20 MG tablet Take 1 tablet (20 mg total) by mouth daily at 6 PM. Patient not taking: Reported on 01/13/2018 09/09/17   Bing Neighbors, FNP  cyclobenzaprine (FLEXERIL) 10 MG tablet Take 1 tablet (10 mg total) by mouth 2 (two) times daily as needed for muscle spasms. 10/18/17   Vanetta Mulders, MD  hydrochlorothiazide (HYDRODIURIL) 25 MG tablet Take 1 tablet (25 mg total) by mouth daily. 01/13/18 02/12/18  Nira Conn, MD  lisinopril-hydrochlorothiazide (ZESTORETIC) 20-12.5 MG tablet Take 1 tablet by mouth daily. 09/09/17 01/13/18  Bing Neighbors, FNP  naproxen (NAPROSYN) 500 MG tablet Take 1 tablet (500 mg total) by mouth 2 (two) times daily. Patient not taking: Reported on 01/13/2018 10/18/17   Vanetta Mulders, MD    Family History Family History  Problem Relation Age of Onset  . Heart failure Mother   . Hypertension Mother   . Stroke Neg Hx     Social History Social History   Tobacco Use  . Smoking status: Current Every Day Smoker    Packs/day: 0.25    Types: Cigarettes  . Smokeless tobacco: Never Used  Substance Use Topics  . Alcohol use: Yes    Comment: occasinally  . Drug use: No     Allergies   Patient has no known allergies.   Review  of Systems Review of Systems  Constitutional: Negative.   Gastrointestinal: Negative.   Genitourinary: Negative.   Musculoskeletal: Positive for back pain. Negative for arthralgias, gait problem and neck pain.  Skin: Negative.   Neurological: Negative for weakness and numbness.   Physical Exam Updated Vital Signs BP (!) 144/95 (BP Location: Right Arm)   Pulse 85   Temp 98 F (36.7 C) (Oral)   Resp 20   SpO2 100%   Physical Exam  Constitutional: She is cooperative.  Musculoskeletal:       Cervical back: Normal.       Thoracic back: She exhibits tenderness and bony  tenderness. She exhibits normal range of motion and no spasm.       Lumbar back: She exhibits tenderness. She exhibits normal range of motion, no bony tenderness and no spasm.  Upper and lower extremities have full ROM bilaterally with 5/5 strength. Bilateral muscular tenderness in thoracic and lumbar region. No muscle spasm appreciated.  Neurological: She is alert.  Skin: Skin is warm and intact. Capillary refill takes less than 2 seconds.  Nursing note and vitals reviewed.  ED Treatments / Results  Labs (all labs ordered are listed, but only abnormal results are displayed) Labs Reviewed - No data to display  EKG None  Radiology No results found.  Procedures Procedures (including critical care time)  Medications Ordered in ED Medications - No data to display   Initial Impression / Assessment and Plan / ED Course  Triage vital signs and the nursing notes have been reviewed.  Pertinent labs & imaging results that were available during care of the patient were reviewed and considered in medical decision making (see chart for details).    Patient presents with bilateral back pain which correlates with patient's recent change in occupation which is more physically demanding. Physical exam is unremarkable except for mild tenderness over parapsinal muscles of thoracic and lumbar regions. There are no neuro deficits or GU complaints that would raise concern for spinal cord pathology. Patient is in no distress and well appearing. Patient has full sensation in upper and lower extremities bilaterally. She also has full ROM in her back and no bony tenderness. No indication for back imaging due to physical exam findings, lack of bony tenderness and neuro abnormalities and acute onset of pain. There are no other physical exam findings or s/s that suggest an underlying infectious or rheumatologic process that warrant further evaluation or intervention today.  Final Clinical Impressions(s) / ED  Diagnoses  1. Back Pain. Likely due to muscle overuse. Education provided on OTC and supportive treatment for pain relief and inflammation. Advised to follow-up with PCP if pain persists > 6 weeks.  Dispo: Home. After thorough clinical evaluation, this patient is determined to be medically stable and can be safely discharged with the previously mentioned treatment and/or outpatient follow-up/referral(s). At this time, there are no other apparent medical conditions that require further screening, evaluation or treatment.   Final diagnoses:  Acute left-sided back pain, unspecified back location    ED Discharge Orders    None        Reva BoresMortis, Carley Strickling I, PA-C 02/12/18 1606    Raeford RazorKohut, Stephen, MD 02/12/18 1635

## 2018-02-12 NOTE — ED Notes (Signed)
Patient able to ambulate independently  

## 2018-02-12 NOTE — ED Triage Notes (Signed)
Pt in c/o mid back pain that started last week, pain is worse with movement, worse going from laying to standing, unsure of injury

## 2018-02-12 NOTE — Discharge Instructions (Addendum)
Your back pain is coming from muscle strain.  You may use Tylenol and/or Ibuprofen (or Naproxen) for pain relief and swelling. You may also use heat or ice in 15-20 minute intervals for additional relief. You may follow-up with your PCP if you continue to have issues for more than 4-6 weeks.

## 2018-02-13 ENCOUNTER — Other Ambulatory Visit: Payer: Self-pay

## 2018-02-15 MED ORDER — HYDROCHLOROTHIAZIDE 25 MG PO TABS: 25.0000 mg | ORAL_TABLET | Freq: Every day | ORAL | 0 refills | Status: DC

## 2018-02-16 ENCOUNTER — Other Ambulatory Visit: Payer: Self-pay

## 2018-02-16 MED ORDER — HYDROCHLOROTHIAZIDE 25 MG PO TABS: 25.0000 mg | ORAL_TABLET | Freq: Every day | ORAL | 0 refills | Status: DC

## 2018-02-16 MED FILL — HYDROCHLOROTHIAZIDE 25 MG T: 25 | 30 days supply | Qty: 30 | Fill #0

## 2018-02-16 NOTE — Telephone Encounter (Signed)
Patient has appointment scheduled for 03/17/2018.

## 2018-03-02 ENCOUNTER — Ambulatory Visit (INDEPENDENT_AMBULATORY_CARE_PROVIDER_SITE_OTHER): Payer: Self-pay | Admitting: *Deleted

## 2018-03-02 DIAGNOSIS — I639 Cerebral infarction, unspecified: Secondary | ICD-10-CM

## 2018-03-02 NOTE — Progress Notes (Signed)
Carelink Summary Report / Loop Recorder 

## 2018-03-11 ENCOUNTER — Emergency Department (HOSPITAL_COMMUNITY): Payer: Self-pay

## 2018-03-11 ENCOUNTER — Emergency Department (HOSPITAL_COMMUNITY)
Admission: EM | Admit: 2018-03-11 | Discharge: 2018-03-11 | Disposition: A | Discharge status: Home / Self Care | Payer: Self-pay | Attending: Emergency Medicine | Admitting: Emergency Medicine

## 2018-03-11 ENCOUNTER — Encounter (HOSPITAL_COMMUNITY): Payer: Self-pay | Admitting: Emergency Medicine

## 2018-03-11 DIAGNOSIS — Z79899 Other long term (current) drug therapy: Secondary | ICD-10-CM | POA: Insufficient documentation

## 2018-03-11 DIAGNOSIS — Y929 Unspecified place or not applicable: Secondary | ICD-10-CM | POA: Insufficient documentation

## 2018-03-11 DIAGNOSIS — W19XXXA Unspecified fall, initial encounter: Secondary | ICD-10-CM | POA: Insufficient documentation

## 2018-03-11 DIAGNOSIS — S93402A Sprain of unspecified ligament of left ankle, initial encounter: Secondary | ICD-10-CM | POA: Insufficient documentation

## 2018-03-11 DIAGNOSIS — Y999 Unspecified external cause status: Secondary | ICD-10-CM | POA: Insufficient documentation

## 2018-03-11 DIAGNOSIS — I10 Essential (primary) hypertension: Secondary | ICD-10-CM | POA: Insufficient documentation

## 2018-03-11 DIAGNOSIS — X501XXA Overexertion from prolonged static or awkward postures, initial encounter: Secondary | ICD-10-CM | POA: Insufficient documentation

## 2018-03-11 DIAGNOSIS — F17210 Nicotine dependence, cigarettes, uncomplicated: Secondary | ICD-10-CM | POA: Insufficient documentation

## 2018-03-11 DIAGNOSIS — Y939 Activity, unspecified: Secondary | ICD-10-CM | POA: Insufficient documentation

## 2018-03-11 DIAGNOSIS — Z8673 Personal history of transient ischemic attack (TIA), and cerebral infarction without residual deficits: Secondary | ICD-10-CM | POA: Insufficient documentation

## 2018-03-11 MED ORDER — ACETAMINOPHEN 500 MG PO TABS
1000.0000 mg | ORAL_TABLET | Freq: Once | ORAL | Status: AC
  Administered 2018-03-11: 1000 mg via ORAL
  Filled 2018-03-11: qty 2

## 2018-03-11 NOTE — ED Triage Notes (Signed)
Patient to ED c/o L ankle pain after tripping and falling 2 days ago, able to bear weight but very painful.

## 2018-03-11 NOTE — ED Provider Notes (Signed)
[facility] Provider Note   CSN: [id number] Arrival date & time: 03/11/18  1327     History   Chief Complaint Chief Complaint  Patient presents with  . Foot Injury    HPI [facility] is a 37 y.o. female.  [facility] is a 37 y.o. Female with a history of hypertension, stroke, asthma and gout, who presents to the emergency department for evaluation of left ankle pain and swelling.  Patient reports she had a mechanical fall 2 days ago where she tripped rolling her ankle.  She did fall to the ground but did not hit her head, denies any neck or back pain.  She reports she initially had some pain in her hip but this has completely resolved.  She continues to have pain and swelling over the ankle with a constant dull ache.  Pain has not improved since onset.  Patient has not taken any medication prior to arrival for pain was given a dose of Tylenol in triage which she reports significantly improved her symptoms.  She reports pain is worse with palpation as well as ambulation and she has had a hard time bearing weight, presented to the ED using a single crutch.  She denies any numbness or weakness in the ankle or foot.  No bruising or discoloration.  No prior injury or surgeries.     Past Medical History:  Diagnosis Date  . Asthma   . Depression   . Gout   . Hypertension   . Stroke [facility])     Patient Active Problem List   Diagnosis Date Noted  . Chronic nonintractable headache 08/06/2017  . Essential hypertension 08/06/2017  . Stroke [facility]) 07/17/2017    Past Surgical History:  Procedure Laterality Date  . LOOP RECORDER INSERTION N/A 08/19/2017   Procedure: LOOP RECORDER INSERTION;  Surgeon: Hillis Range, MD;  [facility]: [facility] INVASIVE CV LAB;  Service: Cardiovascular;  Laterality: N/A;  . TEE WITHOUT CARDIOVERSION N/A 08/19/2017   Procedure: TRANSESOPHAGEAL ECHOCARDIOGRAM (TEE);  Surgeon: Pricilla Riffle, MD;  Location: [facility] ENDOSCOPY;  Service:  Cardiovascular;  Laterality: N/A;     OB History   None      Home Medications    Prior to Admission medications   Medication Sig Start Date End Date Taking? Authorizing Provider  albuterol (PROVENTIL HFA;VENTOLIN HFA) 108 (90 Base) MCG/ACT inhaler Inhale 2 puffs into the lungs every 4 (four) hours as needed for wheezing or shortness of breath. 09/09/17   Bing Neighbors, FNP  allopurinol (ZYLOPRIM) 100 MG tablet Take 1 tablet (100 mg total) by mouth daily. Patient taking differently: Take 100 mg by mouth daily as needed (gout pain).  09/09/17   Bing Neighbors, FNP  aspirin 325 MG tablet Take 1 tablet (325 mg total) by mouth daily. 09/09/17   Bing Neighbors, FNP  atorvastatin (LIPITOR) 20 MG tablet Take 1 tablet (20 mg total) by mouth daily at 6 PM. Patient not taking: Reported on 01/13/2018 09/09/17   Bing Neighbors, FNP  cyclobenzaprine (FLEXERIL) 10 MG tablet Take 1 tablet (10 mg total) by mouth 2 (two) times daily as needed for muscle spasms. 10/18/17   Vanetta Mulders, MD  hydrochlorothiazide (HYDRODIURIL) 25 MG tablet Take 1 tablet (25 mg total) by mouth daily. 02/16/18 03/18/18  Kallie Locks, FNP  lisinopril-hydrochlorothiazide (ZESTORETIC) 20-12.5 MG tablet Take 1 tablet by mouth daily. 09/09/17 01/13/18  Bing Neighbors, FNP  naproxen (NAPROSYN) 500 MG tablet Take 1 tablet (500  mg total) by mouth 2 (two) times daily. Patient not taking: Reported on 01/13/2018 10/18/17   Vanetta Mulders, MD    Family History Family History  Problem Relation Age of Onset  . Heart failure Mother   . Hypertension Mother   . Stroke Neg Hx     Social History Social History   Tobacco Use  . Smoking status: Current Every Day Smoker    Packs/day: 0.25    Types: Cigarettes  . Smokeless tobacco: Never Used  Substance Use Topics  . Alcohol use: Yes    Comment: occasinally  . Drug use: No     Allergies   Patient has no known allergies.   Review of Systems Review of Systems    Constitutional: Negative for chills and fever.  Musculoskeletal: Positive for arthralgias and joint swelling.  Skin: Negative for color change and rash.  Neurological: Negative for seizures and weakness.     Physical Exam Updated Vital Signs Pulse 83   Temp 98.8 F (37.1 C) (Oral)   Resp 18   LMP 02/18/2018 (Approximate)   SpO2 100%   Physical Exam  Constitutional: She appears well-developed and well-nourished. No distress.  HENT:  Head: Normocephalic and atraumatic.  Eyes: Right eye exhibits no discharge. Left eye exhibits no discharge.  Pulmonary/Chest: Effort normal. No respiratory distress.  Musculoskeletal:  Tenderness to palpation over the left ankle, there is mild amount of swelling over the medial and lateral malleolus with faint ecchymosis present, 5/5 strength with dorsi and plantar flexion with some discomfort.  2+ DP and TP pulses and sensation intact, all compartments soft.  No pain over the calf, no pain at the knee or hip.  Neurological: She is alert. Coordination normal.  Skin: Skin is warm and dry. Capillary refill takes less than 2 seconds. She is not diaphoretic.  Psychiatric: She has a normal mood and affect. Her behavior is normal.  Nursing note and vitals reviewed.    ED Treatments / Results  Labs (all labs ordered are listed, but only abnormal results are displayed) Labs Reviewed - No data to display  EKG None  Radiology Dg Ankle Complete Left  Result Date: 03/11/2018 CLINICAL DATA:  Ankle pain after fall EXAM: LEFT ANKLE COMPLETE - 3+ VIEW COMPARISON:  None. FINDINGS: No fracture or malalignment. Ankle mortise is symmetric. There is generalized soft tissue swelling. Small plantar calcaneal spur. IMPRESSION: No acute osseous abnormality. Electronically Signed   By: Jasmine Pang M.D.   On: 03/11/2018 14:30    Procedures Procedures (including critical care time)  Medications Ordered in ED Medications  acetaminophen (TYLENOL) tablet 1,000 mg  (1,000 mg Oral Given 03/11/18 1348)     Initial Impression / Assessment and Plan / ED Course  I have reviewed the triage vital signs and the nursing notes.  Pertinent labs & imaging results that were available during my care of the patient were reviewed by me and considered in my medical decision making (see chart for details).  Presentation consistent with ankle sprain. Tenderness and swelling over medial and lateral malleolus, pt is neurovascularly intact, and x-ray negative for fracture, and shows ankle mortise is intact. Pain treated in the ED. Pt placed in ASO brace and provided crutches, ambulated without difficulty. Pt stable for discharge home with ibuprofen for pain. Pt to follow-up with ortho in one week if symptoms not improving. Return precautions discussed, Pt expresses understanding and agrees with plan.   Final Clinical Impressions(s) / ED Diagnoses   Final diagnoses:  Sprain  of left ankle, unspecified ligament, initial encounter    ED Discharge Orders    None       Dartha Lodge, [place] 03/11/18 1600    Loren Racer, MD 03/12/18 1019

## 2018-03-11 NOTE — ED Provider Notes (Signed)
Patient placed in Quick Look pathway, seen and evaluated   Chief Complaint: left foot pain   HPI:   37 year old with left foot pain after a mechanical fall 2 days ago.  She has not been evaluated since the injury.  She denies hitting her head.  She reports that she has been able to ambulate, but it is been extremely painful and she has had a limp.  Denies numbness or weakness.  ROS: Left foot pain   Physical Exam:   Gen: No distress  Neuro: Awake and Alert  Skin: Warm    Focused Exam: Diffuse TTP over the medial malleolus of the left ankle and dorsum of the left foot with mild edema and ecchymosis.  DP pulses are 2+ and symmetric.   Initiation of care has begun. The patient has been counseled on the process, plan, and necessity for staying for the completion/evaluation, and the remainder of the medical screening examination    Barkley Boards, PA-C 03/11/18 1407    Raeford Razor, MD 03/13/18 [phone]

## 2018-03-11 NOTE — Discharge Instructions (Signed)
Your x-ray shows no evidence of fracture, pain and swelling likely due to ankle sprain, please use brace and crutches try and stay off the ankle as much as possible, ice and elevate the ankle.  Continue using Tylenol for pain.  If symptoms are not improving in 1 week you can follow-up with orthopedics.  Return to the emergency department for significantly worsened pain or swelling, redness, warmth or fevers, numbness or weakness or any other new or concerning symptoms.

## 2018-03-13 LAB — CUP PACEART REMOTE DEVICE CHECK
Implantable Pulse Generator Implant Date: 20190212
MDC IDC SESS DTM: 20190724164032

## 2018-03-17 ENCOUNTER — Ambulatory Visit: Payer: Self-pay | Admitting: Family Medicine

## 2018-03-20 ENCOUNTER — Other Ambulatory Visit: Payer: Self-pay | Admitting: Family Medicine

## 2018-03-23 ENCOUNTER — Other Ambulatory Visit: Payer: Self-pay | Admitting: Family Medicine

## 2018-03-25 ENCOUNTER — Telehealth: Payer: Self-pay

## 2018-03-25 NOTE — Telephone Encounter (Signed)
Left a vm for patient to come in for a office visit before medication can be refilled. Patient no showed her appointment on 03/17/2018.

## 2018-03-30 ENCOUNTER — Ambulatory Visit (INDEPENDENT_AMBULATORY_CARE_PROVIDER_SITE_OTHER): Payer: Self-pay | Admitting: Family Medicine

## 2018-03-30 ENCOUNTER — Ambulatory Visit: Payer: Self-pay | Admitting: Family Medicine

## 2018-03-30 ENCOUNTER — Encounter: Payer: Self-pay | Admitting: Family Medicine

## 2018-03-30 VITALS — BP 152/99 | HR 84 | Temp 98.1°F | Ht 64.0 in | Wt 212.0 lb

## 2018-03-30 DIAGNOSIS — I10 Essential (primary) hypertension: Secondary | ICD-10-CM

## 2018-03-30 DIAGNOSIS — R52 Pain, unspecified: Secondary | ICD-10-CM

## 2018-03-30 DIAGNOSIS — F17200 Nicotine dependence, unspecified, uncomplicated: Secondary | ICD-10-CM

## 2018-03-30 DIAGNOSIS — M10471 Other secondary gout, right ankle and foot: Secondary | ICD-10-CM

## 2018-03-30 DIAGNOSIS — Z131 Encounter for screening for diabetes mellitus: Secondary | ICD-10-CM

## 2018-03-30 DIAGNOSIS — I639 Cerebral infarction, unspecified: Secondary | ICD-10-CM

## 2018-03-30 DIAGNOSIS — Z09 Encounter for follow-up examination after completed treatment for conditions other than malignant neoplasm: Secondary | ICD-10-CM

## 2018-03-30 LAB — POCT GLYCOSYLATED HEMOGLOBIN (HGB A1C): Hemoglobin A1C: 4.9 % (ref 4.0–5.6)

## 2018-03-30 MED ORDER — ASPIRIN 325 MG PO TABS: 325.0000 mg | ORAL_TABLET | Freq: Every day | ORAL | 3 refills | Status: DC

## 2018-03-30 MED ORDER — ATORVASTATIN CALCIUM 20 MG PO TABS: 20.0000 mg | ORAL_TABLET | Freq: Every day | ORAL | 3 refills | Status: DC

## 2018-03-30 MED ORDER — ALBUTEROL SULFATE HFA 108 (90 BASE) MCG/ACT IN AERS: 2.0000 | INHALATION_SPRAY | RESPIRATORY_TRACT | 11 refills | Status: DC | PRN

## 2018-03-30 MED ORDER — ALLOPURINOL 100 MG PO TABS: 100.0000 mg | ORAL_TABLET | Freq: Every day | ORAL | 3 refills | Status: DC

## 2018-03-30 MED ORDER — HYDROCHLOROTHIAZIDE 25 MG PO TABS: 25.0000 mg | ORAL_TABLET | Freq: Every day | ORAL | 3 refills | Status: DC

## 2018-03-30 MED FILL — HYDROCHLOROTHIAZIDE 25 MG T: 25 | 30 days supply | Qty: 30 | Fill #0

## 2018-03-30 MED FILL — ATORVASTATIN 20 MG TABLET: 20 | 30 days supply | Qty: 30 | Fill #0

## 2018-03-30 MED FILL — ALBUTEROL SULFATE HFA 108 (: 108 (90 BAS | 16 days supply | Qty: 9 | Fill #0

## 2018-03-30 MED FILL — ALLOPURINOL 100 MG TABLET: 100 | 30 days supply | Qty: 30 | Fill #0

## 2018-03-30 NOTE — Progress Notes (Signed)
Follow Up  Subjective:    Patient ID: [facility], female    DOB: [birth year], 37 y.o.   MRN: [id number]  No chief complaint on file.   HPI  [facility] is a 37 year old female with a past medical history of Stroke, Hypertension, Gout, Depression, and Asthma. She is here today for follow up.  Current Status: Since her last office visit, she is has had several ED visits for ankle and back problems.   She is s/p: stroke on 07/2017. She recently had visit with Cardiologist 45/2019.   She continues to smoke 8 cigarettes daily.   She denies dizziness, visual changes, shortness of breath, cough, chest pain, heart palpitations, and falls. She has occasionally headaches with position changes. Denies severe headaches, confusion, seizures, double vision, and blurred vision, nausea and vomiting.  She denies fevers, chills, fatigue, recent infections, weight loss, and night sweats. No reports of GI problems such as nausea, vomiting, diarrhea, and constipation. She has no reports of blood in stools, dysuria and hematuria. No depression or anxiety, and denies suicidal ideations, homicidal ideations, or auditory hallucinations. She denies pain today.   Past Medical History:  Diagnosis Date  . Asthma   . Depression   . Gout   . Hypertension   . Stroke [facility])     Family History  Problem Relation Age of Onset  . Heart failure Mother   . Hypertension Mother   . Stroke Neg Hx     Social History   Socioeconomic History  . Marital status: Single    Spouse name: Not on file  . Number of children: Not on file  . Years of education: Not on file  . Highest education level: Not on file  Occupational History  . Not on file  Social Needs  . Financial resource strain: Not on file  . Food insecurity:    Worry: Not on file    Inability: Not on file  . Transportation needs:    Medical: Not on file    Non-medical: Not on file  Tobacco Use  . Smoking status: Current Every Day Smoker    Packs/day:  0.25    Types: Cigarettes  . Smokeless tobacco: Never Used  Substance and Sexual Activity  . Alcohol use: Yes    Comment: occasinally  . Drug use: No  . Sexual activity: Not on file  Lifestyle  . Physical activity:    Days per week: Not on file    Minutes per session: Not on file  . Stress: Not on file  Relationships  . Social connections:    Talks on phone: Not on file    Gets together: Not on file    Attends religious service: Not on file    Active member of club or organization: Not on file    Attends meetings of clubs or organizations: Not on file    Relationship status: Not on file  . Intimate partner violence:    Fear of current or ex partner: Not on file    Emotionally abused: Not on file    Physically abused: Not on file    Forced sexual activity: Not on file  Other Topics Concern  . Not on file  Social History Narrative  . Not on file    Past Surgical History:  Procedure Laterality Date  . LOOP RECORDER INSERTION N/A 08/19/2017   Procedure: LOOP RECORDER INSERTION;  Surgeon: Hillis Range, MD;  [facility] INVASIVE CV LAB;  Service: Cardiovascular;  Laterality:  N/A;  . TEE WITHOUT CARDIOVERSION N/A 08/19/2017   Procedure: TRANSESOPHAGEAL ECHOCARDIOGRAM (TEE);  Surgeon: Pricilla Riffleoss, Paula V, MD;  [facility];  Service: Cardiovascular;  Laterality: N/A;     There is no immunization history on file for this patient.  Meds ordered this encounter  Medications  . albuterol (PROVENTIL HFA;VENTOLIN HFA) 108 (90 Base) MCG/ACT inhaler    Sig: Inhale 2 puffs into the lungs every 4 (four) hours as needed for wheezing or shortness of breath.    Dispense:  1 Inhaler    Refill:  11  . aspirin 325 MG tablet    Sig: Take 1 tablet (325 mg total) by mouth daily.    Dispense:  30 tablet    Refill:  3  . hydrochlorothiazide (HYDRODIURIL) 25 MG tablet    Sig: Take 1 tablet (25 mg total) by mouth daily.    Dispense:  30 tablet    Refill:  3  . atorvastatin (LIPITOR) 20  MG tablet    Sig: Take 1 tablet (20 mg total) by mouth daily at 6 PM.    Dispense:  30 tablet    Refill:  3    Pick up as needed  . allopurinol (ZYLOPRIM) 100 MG tablet    Sig: Take 1 tablet (100 mg total) by mouth daily.    Dispense:  30 tablet    Refill:  3    No Known Allergies  BP (!) 152/99   Pulse 84   Temp 98.1 F (36.7 C) (Oral)   Ht 5\' 4"  (1.626 m)   Wt 212 lb (96.2 kg)   LMP 03/04/2018   SpO2 99%   BMI 36.39 kg/m    Review of Systems  Constitutional: Negative.   HENT: Negative.   Respiratory: Negative.   Cardiovascular: Negative.   Gastrointestinal: Positive for abdominal distention (Obese).  Genitourinary: Negative.   Musculoskeletal: Negative.   Skin: Negative.   Allergic/Immunologic: Negative.   Neurological: Negative.   Psychiatric/Behavioral: Negative.     Objective:   Physical Exam  Constitutional: She appears well-developed and well-nourished.  Cardiovascular: Normal rate, regular rhythm, normal heart sounds and intact distal pulses.  Pulmonary/Chest: Effort normal and breath sounds normal.  Abdominal: Soft.  Musculoskeletal: Normal range of motion.  Skin: Skin is warm.  Nursing note and vitals reviewed.  Assessment & Plan:   1. Screening for diabetes mellitus Hgb A1c is within normal levels at 4.9 today.  She will continue to decrease foods/beverages high in sugars and carbs and follow Heart Healthy or DASH diet. Increase physical activity to at least 30 minutes cardio exercise daily.  - POCT urinalysis dipstick - POCT glycosylated hemoglobin (Hb A1C)  2. Cerebrovascular accident (CVA), unspecified mechanism (HCC) Stable. She is doing well. She will continue medications as prescribed. She will continue to follow up with Cardiologist every 6 months.  - atorvastatin (LIPITOR) 20 MG tablet; Take 1 tablet (20 mg total) by mouth daily at 6 PM.  Dispense: 30 tablet; Refill: 3  3. Essential hypertension Blood pressure is elevated today at  152/99. She will restart HCTZ as prescribed. We will have her come in for blood pressure check in 1 week. We will continue to monitor.  - aspirin 325 MG tablet; Take 1 tablet (325 mg total) by mouth daily.  Dispense: 30 tablet; Refill: 3 - hydrochlorothiazide (HYDRODIURIL) 25 MG tablet; Take 1 tablet (25 mg total) by mouth daily.  Dispense: 30 tablet; Refill: 3  4. Current smoker - albuterol (PROVENTIL HFA;VENTOLIN HFA)  108 (90 Base) MCG/ACT inhaler; Inhale 2 puffs into the lungs every 4 (four) hours as needed for wheezing or shortness of breath.  Dispense: 1 Inhaler; Refill: 11  5. 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 Monitor.  - allopurinol (ZYLOPRIM) 100 MG tablet; Take 1 tablet (100 mg total) by mouth daily.  Dispense: 30 tablet; Refill: 3  6. Follow up She will follow up in 3 months for Office Visit.  She will follow up in 1 week for Blood pressure check.   Meds ordered this encounter  Medications  . albuterol (PROVENTIL HFA;VENTOLIN HFA) 108 (90 Base) MCG/ACT inhaler    Sig: Inhale 2 puffs into the lungs every 4 (four) hours as needed for wheezing or shortness of breath.    Dispense:  1 Inhaler    Refill:  11  . aspirin 325 MG tablet    Sig: Take 1 tablet (325 mg total) by mouth daily.    Dispense:  30 tablet    Refill:  3  . hydrochlorothiazide (HYDRODIURIL) 25 MG tablet    Sig: Take 1 tablet (25 mg total) by mouth daily.    Dispense:  30 tablet    Refill:  3  . atorvastatin (LIPITOR) 20 MG tablet    Sig: Take 1 tablet (20 mg total) by mouth daily at 6 PM.    Dispense:  30 tablet    Refill:  3    Pick up as needed  . allopurinol (ZYLOPRIM) 100 MG tablet    Sig: Take 1 tablet (100 mg total) by mouth daily.    Dispense:  30 tablet    Refill:  3    Raliegh Ip,  MSN, FNP-C [facility] [address] [place], [place]  [address] [phone]

## 2018-03-31 LAB — CUP PACEART REMOTE DEVICE CHECK
Implantable Pulse Generator Implant Date: 20190212
MDC IDC SESS DTM: 20190826180956

## 2018-04-06 ENCOUNTER — Telehealth: Payer: Self-pay | Admitting: Cardiology

## 2018-04-06 ENCOUNTER — Ambulatory Visit: Payer: Self-pay | Admitting: Family Medicine

## 2018-04-06 ENCOUNTER — Ambulatory Visit (INDEPENDENT_AMBULATORY_CARE_PROVIDER_SITE_OTHER): Payer: Self-pay | Admitting: *Deleted

## 2018-04-06 VITALS — BP 138/90 | HR 74 | Ht 64.0 in | Wt 212.0 lb

## 2018-04-06 DIAGNOSIS — I639 Cerebral infarction, unspecified: Secondary | ICD-10-CM

## 2018-04-06 DIAGNOSIS — Z0130 Encounter for examination of blood pressure without abnormal findings: Secondary | ICD-10-CM

## 2018-04-06 NOTE — Telephone Encounter (Signed)
LMOVM requesting that pt send manual transmission b/c home monitor has not updated in at least 14 days.    

## 2018-04-06 NOTE — Progress Notes (Signed)
Carelink Summary Report / Loop Recorder 

## 2018-04-07 LAB — CUP PACEART REMOTE DEVICE CHECK
MDC IDC PG IMPLANT DT: 20190212
MDC IDC SESS DTM: 20190928181015

## 2018-04-24 ENCOUNTER — Encounter: Payer: Self-pay | Admitting: Cardiology

## 2018-05-05 MED FILL — HYDROCHLOROTHIAZIDE 25 MG T: 25 | 30 days supply | Qty: 30 | Fill #1

## 2018-05-07 ENCOUNTER — Ambulatory Visit (INDEPENDENT_AMBULATORY_CARE_PROVIDER_SITE_OTHER): Payer: Self-pay | Admitting: *Deleted

## 2018-05-07 DIAGNOSIS — I639 Cerebral infarction, unspecified: Secondary | ICD-10-CM

## 2018-05-08 NOTE — Progress Notes (Signed)
Carelink Summary Report / Loop Recorder 

## 2018-05-29 LAB — CUP PACEART REMOTE DEVICE CHECK
Implantable Pulse Generator Implant Date: 20190212
MDC IDC SESS DTM: 20191031183555

## 2018-06-09 ENCOUNTER — Ambulatory Visit (INDEPENDENT_AMBULATORY_CARE_PROVIDER_SITE_OTHER): Payer: Self-pay

## 2018-06-09 DIAGNOSIS — I639 Cerebral infarction, unspecified: Secondary | ICD-10-CM

## 2018-06-10 ENCOUNTER — Emergency Department (HOSPITAL_COMMUNITY)
Admission: EM | Admit: 2018-06-10 | Discharge: 2018-06-10 | Disposition: A | Discharge status: Home / Self Care | Payer: Self-pay | Attending: Emergency Medicine | Admitting: Emergency Medicine

## 2018-06-10 ENCOUNTER — Encounter (HOSPITAL_COMMUNITY): Payer: Self-pay

## 2018-06-10 ENCOUNTER — Emergency Department (HOSPITAL_COMMUNITY): Payer: Self-pay

## 2018-06-10 ENCOUNTER — Other Ambulatory Visit: Payer: Self-pay

## 2018-06-10 DIAGNOSIS — D649 Anemia, unspecified: Secondary | ICD-10-CM | POA: Insufficient documentation

## 2018-06-10 DIAGNOSIS — F17210 Nicotine dependence, cigarettes, uncomplicated: Secondary | ICD-10-CM | POA: Insufficient documentation

## 2018-06-10 DIAGNOSIS — Z79899 Other long term (current) drug therapy: Secondary | ICD-10-CM | POA: Insufficient documentation

## 2018-06-10 DIAGNOSIS — D509 Iron deficiency anemia, unspecified: Secondary | ICD-10-CM

## 2018-06-10 DIAGNOSIS — B9789 Other viral agents as the cause of diseases classified elsewhere: Secondary | ICD-10-CM

## 2018-06-10 DIAGNOSIS — J45909 Unspecified asthma, uncomplicated: Secondary | ICD-10-CM | POA: Insufficient documentation

## 2018-06-10 DIAGNOSIS — J069 Acute upper respiratory infection, unspecified: Secondary | ICD-10-CM | POA: Insufficient documentation

## 2018-06-10 DIAGNOSIS — I10 Essential (primary) hypertension: Secondary | ICD-10-CM | POA: Insufficient documentation

## 2018-06-10 DIAGNOSIS — F17200 Nicotine dependence, unspecified, uncomplicated: Secondary | ICD-10-CM

## 2018-06-10 DIAGNOSIS — S20212A Contusion of left front wall of thorax, initial encounter: Secondary | ICD-10-CM

## 2018-06-10 DIAGNOSIS — Z8673 Personal history of transient ischemic attack (TIA), and cerebral infarction without residual deficits: Secondary | ICD-10-CM | POA: Insufficient documentation

## 2018-06-10 LAB — BASIC METABOLIC PANEL
Anion gap: 9 (ref 5–15)
BUN: 15 mg/dL (ref 6–20)
CO2: 24 mmol/L (ref 22–32)
Calcium: 9 mg/dL (ref 8.9–10.3)
Chloride: 105 mmol/L (ref 98–111)
Creatinine, Ser: 1.46 mg/dL — ABNORMAL HIGH (ref 0.44–1.00)
GFR calc non Af Amer: 45 mL/min — ABNORMAL LOW (ref >60)
GFR, EST AFRICAN AMERICAN: 52 mL/min — AB (ref >60)
Glucose, Bld: 90 mg/dL (ref 70–99)
Potassium: 4.2 mmol/L (ref 3.5–5.1)
SODIUM: 138 mmol/L (ref 135–145)

## 2018-06-10 LAB — CBC WITH DIFFERENTIAL/PLATELET
Abs Immature Granulocytes: 0.04 10*3/uL (ref 0.00–0.07)
Basophils Absolute: 0.1 10*3/uL (ref 0.0–0.1)
Basophils Relative: 1 %
Eosinophils Absolute: 0.3 10*3/uL (ref 0.0–0.5)
Eosinophils Relative: 3 %
HCT: 34.5 % — ABNORMAL LOW (ref 36.0–46.0)
Hemoglobin: 10.4 g/dL — ABNORMAL LOW (ref 12.0–15.0)
IMMATURE GRANULOCYTES: 1 %
LYMPHS PCT: 17 %
Lymphs Abs: 1.5 10*3/uL (ref 0.7–4.0)
MCH: 23.7 pg — ABNORMAL LOW (ref 26.0–34.0)
MCHC: 30.1 g/dL (ref 30.0–36.0)
MCV: 78.8 fL — ABNORMAL LOW (ref 80.0–100.0)
Monocytes Absolute: 0.6 10*3/uL (ref 0.1–1.0)
Monocytes Relative: 7 %
Neutro Abs: 6.1 10*3/uL (ref 1.7–7.7)
Neutrophils Relative %: 71 %
Platelets: 271 10*3/uL (ref 150–400)
RBC: 4.38 MIL/uL (ref 3.87–5.11)
RDW: 14.9 % (ref 11.5–15.5)
WBC: 8.6 10*3/uL (ref 4.0–10.5)
nRBC: 0 % (ref 0.0–0.2)

## 2018-06-10 LAB — I-STAT BETA HCG BLOOD, ED (MC, WL, AP ONLY): I-stat hCG, quantitative: <5 m[IU]/mL (ref <5)

## 2018-06-10 LAB — I-STAT TROPONIN, ED: TROPONIN I, POC: 0 ng/mL (ref 0.00–0.08)

## 2018-06-10 LAB — D-DIMER, QUANTITATIVE: D-Dimer, Quant: 0.59 ug/mL-FEU — ABNORMAL HIGH (ref 0.00–0.50)

## 2018-06-10 MED ORDER — GUAIFENESIN 100 MG/5ML PO LIQD: 100.0000 mg | ORAL | 0 refills | Status: DC | PRN

## 2018-06-10 MED ORDER — ACETAMINOPHEN 500 MG PO TABS
1000.0000 mg | ORAL_TABLET | Freq: Once | ORAL | Status: AC
  Administered 2018-06-10: 1000 mg via ORAL
  Filled 2018-06-10: qty 2

## 2018-06-10 MED ORDER — IPRATROPIUM-ALBUTEROL 0.5-2.5 (3) MG/3ML IN SOLN
3.0000 mL | Freq: Once | RESPIRATORY_TRACT | Status: AC
  Administered 2018-06-10: 3 mL via RESPIRATORY_TRACT
  Filled 2018-06-10: qty 3

## 2018-06-10 MED ORDER — ALBUTEROL SULFATE HFA 108 (90 BASE) MCG/ACT IN AERS: 2.0000 | INHALATION_SPRAY | RESPIRATORY_TRACT | 1 refills | Status: DC | PRN

## 2018-06-10 MED ORDER — IOPAMIDOL (ISOVUE-370) INJECTION 76%
100.0000 mL | Freq: Once | INTRAVENOUS | Status: AC | PRN
  Administered 2018-06-10: 100 mL via INTRAVENOUS

## 2018-06-10 MED ORDER — SODIUM CHLORIDE 0.9 % IV BOLUS
1000.0000 mL | Freq: Once | INTRAVENOUS | Status: AC
  Administered 2018-06-10: 1000 mL via INTRAVENOUS

## 2018-06-10 MED ORDER — IOPAMIDOL (ISOVUE-370) INJECTION 76%
INTRAVENOUS | Status: AC
  Filled 2018-06-10: qty 100

## 2018-06-10 MED FILL — GUAIATUSSIN AC LIQUID: 100-10 | 1 days supply | Qty: 60 | Fill #0

## 2018-06-10 NOTE — ED Triage Notes (Signed)
Pt presents via EMS.  Pt seen earlier today with cough and chest wall pain.  Presents with same now, pt reports worse CP with inspiration and "hard to take a deep breath".  Per pt she was prescribed cough medication upon discharge.

## 2018-06-10 NOTE — ED Provider Notes (Signed)
[facility] EMERGENCY DEPARTMENT Provider Note   CSN: [id number] Arrival date & time: 06/10/18  1442     History   Chief Complaint Chief Complaint  Patient presents with  . Cough    HPI Suzanne Graham is a 37 y.o. female.  HPI  Patient is a 36 year old female with a history of asthma, gout, hypertension, cryptogenic stroke, depression presenting for left-sided chest pain with inspiration, shortness of breath, and cough earlier today for cough productive of sputum.  Cough is present over the past 4 days with nasal congestion, rhinorrhea, and sore throat.  She reports that she has been using her inhaler 2-3 times a day per her baseline.  Denies wheezing.  She reports that when she returned home from the emergency department today, she began to have worsening chest pain particularly deep inspiration.  She reports that it became distressing to her, and she was concerned about worsening clinical status and presented back to the emergency department.  She reports that it is resolved at present, however she will still get the sharp pain when she coughs.  Denies feeling heaviness or pressure, or radiation of the pain.  Denies diaphoresis, nausea, vomiting, or abdominal pain.  Patient denies any fever or chills over the course of her illness.  Patient denies any hemoptysis, leg swelling or tenderness, recent immobilization, hospitalization, recent surgery, hormone use, cancer treatment, history DVT/PE, family history of DVT/PE.  Patient denies any personal history of cardiovascular disease, however she does currently have a loop recorder in place..   Past Medical History:  Diagnosis Date  . Asthma   . Depression   . Gout   . Hypertension   . Stroke [facility])     Patient Active Problem List   Diagnosis Date Noted  . Chronic nonintractable headache 08/06/2017  . Essential hypertension 08/06/2017  . Stroke [facility]) 07/17/2017    Past Surgical History:  Procedure Laterality Date   . LOOP RECORDER INSERTION N/A 08/19/2017   Procedure: LOOP RECORDER INSERTION;  Surgeon: Hillis Range, MD;  Location: [facility] INVASIVE CV LAB;  Service: Cardiovascular;  Laterality: N/A;  . TEE WITHOUT CARDIOVERSION N/A 08/19/2017   Procedure: TRANSESOPHAGEAL ECHOCARDIOGRAM (TEE);  Surgeon: Pricilla Riffle, MD;  Location: [facility] ENDOSCOPY;  Service: Cardiovascular;  Laterality: N/A;     OB History   None      Home Medications    Prior to Admission medications   Medication Sig Start Date End Date Taking? Authorizing Provider  albuterol (PROVENTIL HFA;VENTOLIN HFA) 108 (90 Base) MCG/ACT inhaler Inhale 2 puffs into the lungs every 4 (four) hours as needed for wheezing or shortness of breath. 03/30/18   Kallie Locks, FNP  allopurinol (ZYLOPRIM) 100 MG tablet Take 1 tablet (100 mg total) by mouth daily. 03/30/18   Kallie Locks, FNP  atorvastatin (LIPITOR) 20 MG tablet Take 1 tablet (20 mg total) by mouth daily at 6 PM. 03/30/18   Kallie Locks, FNP  guaiFENesin (ROBITUSSIN) 100 MG/5ML liquid Take 5-10 mLs (100-200 mg total) by mouth every 4 (four) hours as needed for cough. 06/10/18   McDonald, Mia A, PA-C  hydrochlorothiazide (HYDRODIURIL) 25 MG tablet Take 1 tablet (25 mg total) by mouth daily. 03/30/18 07/28/18  Kallie Locks, FNP  ibuprofen (ADVIL,MOTRIN) 200 MG tablet Take 400 mg by mouth every 6 (six) hours as needed for mild pain.    [provider]    Family History Family History  Problem Relation Age of Onset  . Heart failure  Mother   . Hypertension Mother   . Stroke Neg Hx     Social History Social History   Tobacco Use  . Smoking status: Current Every Day Smoker    Packs/day: 0.25    Types: Cigarettes  . Smokeless tobacco: Never Used  Substance Use Topics  . Alcohol use: Yes    Comment: occasinally  . Drug use: No     Allergies   Patient has no known allergies.   Review of Systems Review of Systems  Constitutional: Negative for chills and fever.    HENT: Positive for congestion, rhinorrhea and sore throat. Negative for sinus pain.   Eyes: Negative for visual disturbance.  Respiratory: Positive for cough, chest tightness and shortness of breath.   Cardiovascular: Positive for chest pain. Negative for palpitations and leg swelling.  Gastrointestinal: Negative for abdominal pain, constipation, diarrhea, nausea and vomiting.  Genitourinary: Negative for dysuria and flank pain.  Musculoskeletal: Negative for back pain and myalgias.  Skin: Negative for rash.  Neurological: Negative for dizziness, syncope, light-headedness and headaches.     Physical Exam Updated Vital Signs BP (!) 156/105 (BP Location: Right Arm)   Pulse 83   Temp 98 F (36.7 C) (Oral)   Resp 18   SpO2 97%   Physical Exam  Constitutional: She appears well-developed and well-nourished. No distress.  HENT:  Head: Normocephalic and atraumatic.  Mouth/Throat: Oropharynx is clear and moist.  Bilateral tympanic membranes without erythema or effusion.  Eyes: Pupils are equal, round, and reactive to light. Conjunctivae and EOM are normal.  Neck: Normal range of motion. Neck supple.  Cardiovascular: Normal rate, regular rhythm, S1 normal, S2 normal, normal heart sounds and intact distal pulses.  No murmur heard. Pulmonary/Chest: Effort normal and breath sounds normal. She has no wheezes. She has no rales.  No tachypnea.  Speaking in full sentences.  Abdominal: Soft. She exhibits no distension. There is no tenderness. There is no guarding.  Musculoskeletal: Normal range of motion. She exhibits no edema or deformity.  Lymphadenopathy:    She has no cervical adenopathy.  Neurological: She is alert.  Cranial nerves grossly intact. Patient moves extremities symmetrically and with good coordination.  Skin: Skin is warm and dry. No rash noted. No erythema.  Psychiatric: She has a normal mood and affect. Her behavior is normal. Judgment and thought content normal.  Nursing  note and vitals reviewed.    ED Treatments / Results  Labs (all labs ordered are listed, but only abnormal results are displayed) Labs Reviewed  D-DIMER, QUANTITATIVE (NOT AT [facility]) - Abnormal; Notable for the following components:      Result Value   D-Dimer, Quant 0.59 (*)    All other components within normal limits  CBC WITH DIFFERENTIAL/PLATELET - Abnormal; Notable for the following components:   Hemoglobin 10.4 (*)    HCT 34.5 (*)    MCV 78.8 (*)    MCH 23.7 (*)    All other components within normal limits  BASIC METABOLIC PANEL - Abnormal; Notable for the following components:   Creatinine, Ser 1.46 (*)    GFR calc non Af Amer 45 (*)    GFR calc Af Amer 52 (*)    All other components within normal limits  I-STAT TROPONIN, ED  I-STAT BETA HCG BLOOD, ED ([facility], [facility], [facility] ONLY)    EKG EKG Interpretation  Date/Time:  Wednesday June 10 2018 17:03:51 EST Ventricular Rate:  68 PR Interval:  136 QRS Duration: 88 QT Interval:  410  QTC Calculation: 435 R Axis:   10 Text Interpretation:  Normal sinus rhythm Possible Left atrial enlargement Borderline ECG Confirmed by Kennis Carina [phone]) on 06/10/2018 7:09:06 PM   Radiology Dg Chest 2 View  Result Date: 06/10/2018 CLINICAL DATA:  Productive cough EXAM: CHEST - 2 VIEW COMPARISON:  10/18/2017 FINDINGS: Normal heart size and aortic contours. Implantable loop recorder. There is no edema, consolidation, effusion, or pneumothorax. IMPRESSION: No active cardiopulmonary disease. Electronically Signed   By: Marnee Spring M.D.   On: 06/10/2018 09:07   Ct Angio Chest Pe W/cm &/or Wo Cm  Result Date: 06/10/2018 CLINICAL DATA:  Productive cough for 4 days. EXAM: CT ANGIOGRAPHY CHEST WITH CONTRAST TECHNIQUE: Multidetector CT imaging of the chest was performed using the standard protocol during bolus administration of intravenous contrast. Multiplanar CT image reconstructions and MIPs were obtained to evaluate the vascular anatomy.  CONTRAST:  ISOVUE-370 IOPAMIDOL (ISOVUE-370) INJECTION 76% COMPARISON:  Chest radiograph 06/10/2018 FINDINGS: Cardiovascular: Satisfactory opacification of the pulmonary arteries to the segmental level. No evidence of pulmonary embolism. Normal heart size. No pericardial effusion. Mediastinum/Nodes: No enlarged mediastinal, hilar, or axillary lymph nodes. Thyroid gland, esophagus and trachea demonstrate no significant findings. Lungs/Pleura: No focal consolidation or pulmonary nodules. Upper Abdomen: No acute abnormality. Musculoskeletal: No chest wall abnormality. Loop recorder in the left anterior chest wall. No acute or significant osseous findings. Review of the MIP images confirms the above findings. IMPRESSION: No evidence of pulmonary embolus. No acute abnormalities within thorax. Electronically Signed   By: Ted Mcalpine M.D.   On: 06/10/2018 19:09    Procedures Procedures (including critical care time)  Medications Ordered in ED Medications - No data to display   Initial Impression / Assessment and Plan / ED Course  I have reviewed the triage vital signs and the nursing notes.  Pertinent labs & imaging results that were available during my care of the patient were reviewed by me and considered in my medical decision making (see chart for details).  Clinical Course as of Jun 11 38  Wed Jun 10, 2018  1732 Will order CTPA.  D-Dimer, Quant(!): 0.59 [AM]  1732 Slight increase from prior. Will give post contrast bolus.   Creatinine(!): 1.46 [AM]  1907 Hemoglobin(!): 10.4 [AM]  1913 Negative. Will continue with anti-tussives.   CT Angio Chest PE W/Cm &/Or Wo Cm [AM]    Clinical Course User Index [AM] Aviva Kluver B, PA-C    Patient nontoxic-appearing, afebrile, not tachypneic not tachycardic.  No hypoxia.  Patient represented after worsening chest discomfort particularly with deep inspiration that because patient called EMS.  Different diagnosis includes pleurisy,  pericarditis, pulmonary embolism, ACS.  Patient is a symptomatic at present, but given the change in patient's symptoms, will pulmonary embolism and screening troponin. Case discussed with Dr. Kennis Carina.   Patient remained asymptomatic of chest pain or shortness of breath during emergency department course.  No hypoxia or tachypnea.  Patient did have multiple elevated blood pressure readings, instructed to follow-up with her primary care provider.  Lab work otherwise demonstrating slightly worsening of microcytic anemia from prior laboratory values.  Patient also slightly elevated creatinine from prior values.  See below for trend.  Patient instructed to follow-up with primary care provider in 10 to 14 days for laboratory recheck.  CTPA is negative for pulmonary embolism or lung parenchymal abnormality.  Low suspicion for ACS at this time given the lack of ongoing symptoms, atypical nature, and in the setting of a respiratory infection.  Troponin is negative.  Do not feel delta troponin is necessary.  EKG unchanged from earlier today with no evidence of ischemia, infarction, or arrhythmia.  Lab Results  Component Value Date   CREATININE 1.46 (H) 06/10/2018   CREATININE 1.31 (H) 09/09/2017   CREATININE 1.32 (H) 07/29/2017   Patient instructed to continue with antitussive therapy prescribed earlier in the emergency department, and use her inhaler.  Additional refill prescribed.  Return precautions given for any worsening chest pain, shortness of breath, fever or chills, syncope or presyncope.  Patient is in understanding and agrees with the plan of care.  This is a supervised visit with Dr. Kennis CarinaMichael Bero. Evaluation, management, and discharge planning discussed with this attending physician.  Final Clinical Impressions(s) / ED Diagnoses   Final diagnoses:  Viral URI with cough  Microcytic anemia  Elevated blood pressure reading in office with diagnosis of hypertension    ED Discharge Orders          Ordered    albuterol (PROVENTIL HFA;VENTOLIN HFA) 108 (90 Base) MCG/ACT inhaler  Every 4 hours PRN     06/10/18 1918           Delia ChimesMurray, Rashawna Scoles B, PA-C 06/11/18 0046    Sabas SousBero, Michael M, MD 06/15/18 [phone]

## 2018-06-10 NOTE — Discharge Instructions (Signed)
Please read and follow all provided instructions.  Your diagnoses today include:  1. Viral URI with cough   2. Microcytic anemia   3. Elevated blood pressure reading in office with diagnosis of hypertension     You appear to have an upper respiratory infection (URI). An upper respiratory tract infection, or cold, is a viral infection of the air passages leading to the lungs. It should improve gradually after 5-7 days. You may have a lingering cough that lasts for 2- 4 weeks after the infection.  Tests performed today include: Vital signs. See below for your results today.   Medications prescribed:   Take any prescribed medications only as directed. Treatment for your infection is aimed at treating the symptoms. There are no medications, such as antibiotics, that will cure your infection.   Home care instructions:  Follow any educational materials contained in this packet.   Your illness is contagious and can be spread to others, especially during the first 3 or 4 days. It cannot be cured by antibiotics or other medicines. Take basic precautions such as washing your hands often, covering your mouth when you cough or sneeze, and avoiding public places where you could spread your illness to others.   Please continue drinking plenty of fluids.  Use over-the-counter medicines as needed as directed on packaging for symptom relief.  You may also use ibuprofen or tylenol as directed on packaging for pain or fever.  Do not take multiple medicines containing Tylenol or acetaminophen to avoid taking too much of this medication.  Follow-up instructions: Please follow-up with your primary care provider in the next 3 days for further evaluation of your symptoms if you are not feeling better.   Return instructions:  Please return to the Emergency Department if you experience worsening symptoms.  RETURN IMMEDIATELY IF you develop shortness of breath, chest pain, confusion or altered mental status, a new  rash, become dizzy, faint, or poorly responsive, or are unable to be cared for at home. Please return if you have persistent vomiting and cannot keep down fluids or develop a fever that is not controlled by tylenol or motrin.   Please return if you have any other emergent concerns.  Additional Information:  Your vital signs today were: BP (!) 138/93    Pulse 62    Temp 98 F (36.7 C) (Oral)    Resp 16    SpO2 96%  If your blood pressure (BP) was elevated above 135/85 this visit, please have this repeated by your doctor within one month. --------------
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
## 2018-06-10 NOTE — Progress Notes (Signed)
Carelink Summary Report / Loop Recorder 

## 2018-06-10 NOTE — ED Notes (Signed)
Pt ambulated throughout hallway without assistance. Tolerated well, oxygen saturation remained 95% and above throughout ambulation.

## 2018-06-10 NOTE — Discharge Instructions (Addendum)
Thank you for allowing me to care for you today in the Emergency Department.   Use 2 puffs of your albuterol inhaler with the spacer every 4 hours as needed for cough, wheezing, or shortness of breath.  Use caution with taking cough and cold medicine because it can elevate your blood pressure.  Coricidin is available over-the-counter and can help with cough and cold symptoms, but does not elevate your blood pressure.  Take 5 to 10 mL's of guaifenesin every 4 hours to help break up your cough and congestion.  Stop taking this medication if your blood pressure elevates.  Take 650 mg of Tylenol or 600 mg of ibuprofen with food every 6 hours for pain control.  You can also apply an ice pack for 15 to 20 minutes to help with pain and swelling.  Follow-up with your primary care provider if your symptoms do not start to improve in 1 week.  Quitting smoking may also help prevent recurrence of symptoms in the future.  Return to the emergency department if you develop chest pain with sweating, severe shortness of breath, chest pain that radiates up your neck or down your arm, a high fever despite taking ibuprofen and Tylenol as described above, or other new, concerning symptoms.

## 2018-06-10 NOTE — ED Triage Notes (Signed)
Pt c/o cough for about 4 days. Pt denies SOB, chest pain, or fevers. Pt endorses yellow sputum production.

## 2018-06-10 NOTE — ED Notes (Signed)
Pt verbalizes understanding of d/c instructions. Prescriptions reviewed with patient. Pt ambulatory at d/c with all belongings and with family.   

## 2018-06-23 MED FILL — HYDROCHLOROTHIAZIDE 25 MG T: 25 | 30 days supply | Qty: 30 | Fill #2

## 2018-07-06 ENCOUNTER — Ambulatory Visit (INDEPENDENT_AMBULATORY_CARE_PROVIDER_SITE_OTHER): Payer: Self-pay | Admitting: Family Medicine

## 2018-07-06 VITALS — BP 166/102 | HR 84 | Temp 98.0°F | Ht 64.0 in | Wt 219.0 lb

## 2018-07-06 DIAGNOSIS — Z09 Encounter for follow-up examination after completed treatment for conditions other than malignant neoplasm: Secondary | ICD-10-CM

## 2018-07-06 DIAGNOSIS — I10 Essential (primary) hypertension: Secondary | ICD-10-CM

## 2018-07-06 DIAGNOSIS — R8290 Unspecified abnormal findings in urine: Secondary | ICD-10-CM

## 2018-07-06 LAB — POCT URINALYSIS DIP (MANUAL ENTRY)
Bilirubin, UA: NEGATIVE
Glucose, UA: NEGATIVE mg/dL
Ketones, POC UA: NEGATIVE mg/dL
Nitrite, UA: NEGATIVE
Protein Ur, POC: NEGATIVE mg/dL
Spec Grav, UA: 1.02 (ref 1.010–1.025)
Urobilinogen, UA: 0.2 E.U./dL
pH, UA: 5.5 (ref 5.0–8.0)

## 2018-07-06 MED ORDER — CLONIDINE HCL 0.1 MG PO TABS
0.2000 mg | ORAL_TABLET | Freq: Once | ORAL | Status: AC
  Administered 2018-07-06: 0.2 mg via ORAL

## 2018-07-06 MED ORDER — CLONIDINE HCL 0.1 MG PO TABS
0.1000 mg | ORAL_TABLET | Freq: Once | ORAL | Status: AC
  Administered 2018-07-06: 0.1 mg via ORAL

## 2018-07-06 MED ORDER — AMLODIPINE BESYLATE 10 MG PO TABS: 10.0000 mg | ORAL_TABLET | Freq: Every day | ORAL | 1 refills | Status: DC

## 2018-07-06 MED FILL — AMLODIPINE BESYLATE 10 MG T: 10 | 30 days supply | Qty: 30 | Fill #0

## 2018-07-06 NOTE — Patient Instructions (Addendum)
Amlodipine tablets  What is this medicine?  AMLODIPINE (am LOE di peen) is a calcium-channel blocker. It affects the amount of calcium found in your heart and muscle cells. This relaxes your blood vessels, which can reduce the amount of work the heart has to do. This medicine is used to lower high blood pressure. It is also used to prevent chest pain.  This medicine may be used for other purposes; ask your health care provider or pharmacist if you have questions.  COMMON BRAND NAME(S): Norvasc  What should I tell my health care provider before I take this medicine?  They need to know if you have any of these conditions:  -heart disease  -liver disease  -an unusual or allergic reaction to amlodipine, other medicines, foods, dyes, or preservatives  -pregnant or trying to get pregnant  -breast-feeding  How should I use this medicine?  Take this medicine by mouth with a glass of water. Follow the directions on the prescription label. You can take it with or without food. If it upsets your stomach, take it with food. Take your medicine at regular intervals. Do not take it more often than directed. Do not stop taking except on your doctor's advice.  Talk to your pediatrician regarding the use of this medicine in children. While this drug may be prescribed for children as young as 6 years for selected conditions, precautions do apply.  Patients over 65 years of age may have a stronger reaction and need a smaller dose.  Overdosage: If you think you have taken too much of this medicine contact a poison control center or emergency room at once.  NOTE: This medicine is only for you. Do not share this medicine with others.  What if I miss a dose?  If you miss a dose, take it as soon as you can. If it is almost time for your next dose, take only that dose. Do not take double or extra doses.  What may interact with this medicine?  Do not take this medicine with any of the following medications:  -tranylcypromine  This medicine  may also interact with the following medications:  -clarithromycin  -cyclosporine  -diltiazem  -itraconazole  -simvastatin  -tacrolimus  This list may not describe all possible interactions. Give your health care provider a list of all the medicines, herbs, non-prescription drugs, or dietary supplements you use. Also tell them if you smoke, drink alcohol, or use illegal drugs. Some items may interact with your medicine.  What should I watch for while using this medicine?  Visit your healthcare professional for regular checks on your progress. Check your blood pressure as directed. Ask your healthcare professional what your blood pressure should be and when you should contact him or her.  Do not treat yourself for coughs, colds, or pain while you are using this medicine without asking your healthcare professional for advice. Some medicines may increase your blood pressure.  You may get dizzy. Do not drive, use machinery, or do anything that needs mental alertness until you know how this medicine affects you. Do not stand or sit up quickly, especially if you are an older patient. This reduces the risk of dizzy or fainting spells. Avoid alcoholic drinks; they can make you dizzier.  What side effects may I notice from receiving this medicine?  Side effects that you should report to your doctor or health care professional as soon as possible:  -allergic reactions like skin rash, itching or hives; swelling of the face,   attention (report these to your doctor or health care professional if they continue or are bothersome): -dry mouth -facial flushing -headache -stomach pain -tiredness This list may not describe all possible side effects. Call your doctor for medical advice  about side effects. You may report side effects to FDA at 1-800-FDA-1088. Where should I keep my medicine? Keep out of the reach of children. Store at room temperature between 59 and 86 degrees F (15 and 30 degrees C). Throw away any unused medicine after the expiration date. NOTE: This sheet is a summary. It may not cover all possible information. If you have questions about this medicine, talk to your doctor, pharmacist, or health care provider.  2019 Elsevier/Gold Standard (2018-01-16 15:07:10) Hypertension Hypertension, commonly called high blood pressure, is when the force of blood pumping through the arteries is too strong. The arteries are the blood vessels that carry blood from the heart throughout the body. Hypertension forces the heart to work harder to pump blood and may cause arteries to become narrow or stiff. Having untreated or uncontrolled hypertension can cause heart attacks, strokes, kidney disease, and other problems. A blood pressure reading consists of a higher number over a lower number. Ideally, your blood pressure should be below 120/80. The first ("top") number is called the systolic pressure. It is a measure of the pressure in your arteries as your heart beats. The second ("bottom") number is called the diastolic pressure. It is a measure of the pressure in your arteries as the heart relaxes. What are the causes? The cause of this condition is not known. What increases the risk? Some risk factors for high blood pressure are under your control. Others are not. Factors you can change  Smoking.  Having type 2 diabetes mellitus, high cholesterol, or both.  Not getting enough exercise or physical activity.  Being overweight.  Having too much fat, sugar, calories, or salt (sodium) in your diet.  Drinking too much alcohol. Factors that are difficult or impossible to change  Having chronic kidney disease.  Having a family history of high blood pressure.  Age. Risk  increases with age.  Race. You may be at higher risk if you are African-American.  Gender. Men are at higher risk than women before age 59. After age 70, women are at higher risk than men.  Having obstructive sleep apnea.  Stress. What are the signs or symptoms? Extremely high blood pressure (hypertensive crisis) may cause:  Headache.  Anxiety.  Shortness of breath.  Nosebleed.  Nausea and vomiting.  Severe chest pain.  Jerky movements you cannot control (seizures). How is this diagnosed? This condition is diagnosed by measuring your blood pressure while you are seated, with your arm resting on a surface. The cuff of the blood pressure monitor will be placed directly against the skin of your upper arm at the level of your heart. It should be measured at least twice using the same arm. Certain conditions can cause a difference in blood pressure between your right and left arms. Certain factors can cause blood pressure readings to be lower or higher than normal (elevated) for a short period of time:  When your blood pressure is higher when you are in a health care provider's office than when you are at home, this is called white coat hypertension. Most people with this condition do not need medicines.  When your blood pressure is higher at home than when you are in a health care provider's office, this is called masked hypertension.  Most people with this condition may need medicines to control blood pressure. If you have a high blood pressure reading during one visit or you have normal blood pressure with other risk factors:  You may be asked to return on a different day to have your blood pressure checked again.  You may be asked to monitor your blood pressure at home for 1 week or longer. If you are diagnosed with hypertension, you may have other blood or imaging tests to help your health care provider understand your overall risk for other conditions. How is this treated? This  condition is treated by making healthy lifestyle changes, such as eating healthy foods, exercising more, and reducing your alcohol intake. Your health care provider may prescribe medicine if lifestyle changes are not enough to get your blood pressure under control, and if:  Your systolic blood pressure is above 130.  Your diastolic blood pressure is above 80. Your personal target blood pressure may vary depending on your medical conditions, your age, and other factors. Follow these instructions at home: Eating and drinking   Eat a diet that is high in fiber and potassium, and low in sodium, added sugar, and fat. An example eating plan is called the DASH (Dietary Approaches to Stop Hypertension) diet. To eat this way: ? Eat plenty of fresh fruits and vegetables. Try to fill half of your plate at each meal with fruits and vegetables. ? Eat whole grains, such as whole wheat pasta, Minix rice, or whole grain bread. Fill about one quarter of your plate with whole grains. ? Eat or drink low-fat dairy products, such as skim milk or low-fat yogurt. ? Avoid fatty cuts of meat, processed or cured meats, and poultry with skin. Fill about one quarter of your plate with lean proteins, such as fish, chicken without skin, beans, eggs, and tofu. ? Avoid premade and processed foods. These tend to be higher in sodium, added sugar, and fat.  Reduce your daily sodium intake. Most people with hypertension should eat less than 1,500 mg of sodium a day.  Limit alcohol intake to no more than 1 drink a day for nonpregnant women and 2 drinks a day for men. One drink equals 12 oz of beer, 5 oz of wine, or 1 oz of hard liquor. Lifestyle   Work with your health care provider to maintain a healthy body weight or to lose weight. Ask what an ideal weight is for you.  Get at least 30 minutes of exercise that causes your heart to beat faster (aerobic exercise) most days of the week. Activities may include walking, swimming,  or biking.  Include exercise to strengthen your muscles (resistance exercise), such as pilates or lifting weights, as part of your weekly exercise routine. Try to do these types of exercises for 30 minutes at least 3 days a week.  Do not use any products that contain nicotine or tobacco, such as cigarettes and e-cigarettes. If you need help quitting, ask your health care provider.  Monitor your blood pressure at home as told by your health care provider.  Keep all follow-up visits as told by your health care provider. This is important. Medicines  Take over-the-counter and prescription medicines only as told by your health care provider. Follow directions carefully. Blood pressure medicines must be taken as prescribed.  Do not skip doses of blood pressure medicine. Doing this puts you at risk for problems and can make the medicine less effective.  Ask your health care provider about  side effects or reactions to medicines that you should watch for. Contact a health care provider if:  You think you are having a reaction to a medicine you are taking.  You have headaches that keep coming back (recurring).  You feel dizzy.  You have swelling in your ankles.  You have trouble with your vision. Get help right away if:  You develop a severe headache or confusion.  You have unusual weakness or numbness.  You feel faint.  You have severe pain in your chest or abdomen.  You vomit repeatedly.  You have trouble breathing. Summary  Hypertension is when the force of blood pumping through your arteries is too strong. If this condition is not controlled, it may put you at risk for serious complications.  Your personal target blood pressure may vary depending on your medical conditions, your age, and other factors. For most people, a normal blood pressure is less than 120/80.  Hypertension is treated with lifestyle changes, medicines, or a combination of both. Lifestyle changes include  weight loss, eating a healthy, low-sodium diet, exercising more, and limiting alcohol. This information is not intended to replace advice given to you by your health care provider. Make sure you discuss any questions you have with your health care provider. Document Released: 06/24/2005 Document Revised: 05/22/2016 Document Reviewed: 05/22/2016 Elsevier Interactive Patient Education  2019 Elsevier Inc.     DASH Eating Plan DASH stands for "Dietary Approaches to Stop Hypertension." The DASH eating plan is a healthy eating plan that has been shown to reduce high blood pressure (hypertension). It may also reduce your risk for type 2 diabetes, heart disease, and stroke. The DASH eating plan may also help with weight loss. What are tips for following this plan?  General guidelines  Avoid eating more than 2,300 mg (milligrams) of salt (sodium) a day. If you have hypertension, you may need to reduce your sodium intake to 1,500 mg a day.  Limit alcohol intake to no more than 1 drink a day for nonpregnant women and 2 drinks a day for men. One drink equals 12 oz of beer, 5 oz of wine, or 1 oz of hard liquor.  Work with your health care provider to maintain a healthy body weight or to lose weight. Ask what an ideal weight is for you.  Get at least 30 minutes of exercise that causes your heart to beat faster (aerobic exercise) most days of the week. Activities may include walking, swimming, or biking.  Work with your health care provider or diet and nutrition specialist (dietitian) to adjust your eating plan to your individual calorie needs. Reading food labels   Check food labels for the amount of sodium per serving. Choose foods with less than 5 percent of the Daily Value of sodium. Generally, foods with less than 300 mg of sodium per serving fit into this eating plan.  To find whole grains, look for the word "whole" as the first word in the ingredient list. Shopping  Buy products labeled as  "low-sodium" or "no salt added."  Buy fresh foods. Avoid canned foods and premade or frozen meals. Cooking  Avoid adding salt when cooking. Use salt-free seasonings or herbs instead of table salt or sea salt. Check with your health care provider or pharmacist before using salt substitutes.  Do not fry foods. Cook foods using healthy methods such as baking, boiling, grilling, and broiling instead.  Cook with heart-healthy oils, such as olive, canola, soybean, or sunflower oil. Meal planning  Eat a balanced diet that includes: ? 5 or more servings of fruits and vegetables each day. At each meal, try to fill half of your plate with fruits and vegetables. ? Up to 6-8 servings of whole grains each day. ? Less than 6 oz of lean meat, poultry, or fish each day. A 3-oz serving of meat is about the same size as a deck of cards. One egg equals 1 oz. ? 2 servings of low-fat dairy each day. ? A serving of nuts, seeds, or beans 5 times each week. ? Heart-healthy fats. Healthy fats called Omega-3 fatty acids are found in foods such as flaxseeds and coldwater fish, like sardines, salmon, and mackerel.  Limit how much you eat of the following: ? Canned or prepackaged foods. ? Food that is high in trans fat, such as fried foods. ? Food that is high in saturated fat, such as fatty meat. ? Sweets, desserts, sugary drinks, and other foods with added sugar. ? Full-fat dairy products.  Do not salt foods before eating.  Try to eat at least 2 vegetarian meals each week.  Eat more home-cooked food and less restaurant, buffet, and fast food.  When eating at a restaurant, ask that your food be prepared with less salt or no salt, if possible. What foods are recommended? The items listed may not be a complete list. Talk with your dietitian about what dietary choices are best for you. Grains Whole-grain or whole-wheat bread. Whole-grain or whole-wheat pasta. Snowberger rice. Orpah Cobb. Bulgur. Whole-grain  and low-sodium cereals. Pita bread. Low-fat, low-sodium crackers. Whole-wheat flour tortillas. Vegetables Fresh or frozen vegetables (raw, steamed, roasted, or grilled). Low-sodium or reduced-sodium tomato and vegetable juice. Low-sodium or reduced-sodium tomato sauce and tomato paste. Low-sodium or reduced-sodium canned vegetables. Fruits All fresh, dried, or frozen fruit. Canned fruit in natural juice (without added sugar). Meat and other protein foods Skinless chicken or [place]. Ground chicken or [place]. Pork with fat trimmed off. Fish and seafood. Egg whites. Dried beans, peas, or lentils. Unsalted nuts, nut butters, and seeds. Unsalted canned beans. Lean cuts of beef with fat trimmed off. Low-sodium, lean deli meat. Dairy Low-fat (1%) or fat-free (skim) milk. Fat-free, low-fat, or reduced-fat cheeses. Nonfat, low-sodium ricotta or cottage cheese. Low-fat or nonfat yogurt. Low-fat, low-sodium cheese. Fats and oils Soft margarine without trans fats. Vegetable oil. Low-fat, reduced-fat, or light mayonnaise and salad dressings (reduced-sodium). Canola, safflower, olive, soybean, and sunflower oils. Avocado. Seasoning and other foods Herbs. Spices. Seasoning mixes without salt. Unsalted popcorn and pretzels. Fat-free sweets. What foods are not recommended? The items listed may not be a complete list. Talk with your dietitian about what dietary choices are best for you. Grains Baked goods made with fat, such as croissants, muffins, or some breads. Dry pasta or rice meal packs. Vegetables Creamed or fried vegetables. Vegetables in a cheese sauce. Regular canned vegetables (not low-sodium or reduced-sodium). Regular canned tomato sauce and paste (not low-sodium or reduced-sodium). Regular tomato and vegetable juice (not low-sodium or reduced-sodium). Rosita Fire. Olives. Fruits Canned fruit in a light or heavy syrup. Fried fruit. Fruit in cream or butter sauce. Meat and other protein foods Fatty cuts  of meat. Ribs. Fried meat. Tomasa Blase. Sausage. Bologna and other processed lunch meats. Salami. Fatback. Hotdogs. Bratwurst. Salted nuts and seeds. Canned beans with added salt. Canned or smoked fish. Whole eggs or egg yolks. Chicken or [place] with skin. Dairy Whole or 2% milk, cream, and half-and-half. Whole or full-fat cream cheese. Whole-fat or sweetened yogurt.  Full-fat cheese. Nondairy creamers. Whipped toppings. Processed cheese and cheese spreads. Fats and oils Butter. Stick margarine. Lard. Shortening. Ghee. Bacon fat. Tropical oils, such as coconut, palm kernel, or palm oil. Seasoning and other foods Salted popcorn and pretzels. Onion salt, garlic salt, seasoned salt, table salt, and sea salt. Worcestershire sauce. Tartar sauce. Barbecue sauce. Teriyaki sauce. Soy sauce, including reduced-sodium. Steak sauce. Canned and packaged gravies. Fish sauce. Oyster sauce. Cocktail sauce. Horseradish that you find on the shelf. Ketchup. Mustard. Meat flavorings and tenderizers. Bouillon cubes. Hot sauce and Tabasco sauce. Premade or packaged marinades. Premade or packaged taco seasonings. Relishes. Regular salad dressings. Where to find more information:  National Heart, Lung, and Blood Institute: PopSteam.is  American Heart Association: www.heart.org Summary  The DASH eating plan is a healthy eating plan that has been shown to reduce high blood pressure (hypertension). It may also reduce your risk for type 2 diabetes, heart disease, and stroke.  With the DASH eating plan, you should limit salt (sodium) intake to 2,300 mg a day. If you have hypertension, you may need to reduce your sodium intake to 1,500 mg a day.  When on the DASH eating plan, aim to eat more fresh fruits and vegetables, whole grains, lean proteins, low-fat dairy, and heart-healthy fats.  Work with your health care provider or diet and nutrition specialist (dietitian) to adjust your eating plan to your individual calorie  needs. This information is not intended to replace advice given to you by your health care provider. Make sure you discuss any questions you have with your health care provider. Document Released: 06/13/2011 Document Revised: 06/17/2016 Document Reviewed: 06/17/2016 Elsevier Interactive Patient Education  2019 Elsevier Inc.            Heart-Healthy Eating Plan Heart-healthy meal planning includes:  Eating less unhealthy fats.  Eating more healthy fats.  Making other changes in your diet. Talk with your doctor or a diet specialist (dietitian) to create an eating plan that is right for you. What is my plan? Your doctor may recommend an eating plan that includes:  Total fat: ______% or less of total calories a day.  Saturated fat: ______% or less of total calories a day.  Cholesterol: less than _________mg a day. What are tips for following this plan? Cooking Avoid frying your food. Try to bake, boil, grill, or broil it instead. You can also reduce fat by:  Removing the skin from poultry.  Removing all visible fats from meats.  Steaming vegetables in water or broth. Meal planning   At meals, divide your plate into four equal parts: ? Fill one-half of your plate with vegetables and green salads. ? Fill one-fourth of your plate with whole grains. ? Fill one-fourth of your plate with lean protein foods.  Eat 4-5 servings of vegetables per day. A serving of vegetables is: ? 1 cup of raw or cooked vegetables. ? 2 cups of raw leafy greens.  Eat 4-5 servings of fruit per day. A serving of fruit is: ? 1 medium whole fruit. ?  cup of dried fruit. ?  cup of fresh, frozen, or canned fruit. ?  cup of 100% fruit juice.  Eat more foods that have soluble fiber. These are apples, broccoli, carrots, beans, peas, and barley. Try to get 20-30 g of fiber per day.  Eat 4-5 servings of nuts, legumes, and seeds per week: ? 1 serving of dried beans or legumes equals  cup after  being cooked. ? 1 serving of nuts  is  cup. ? 1 serving of seeds equals 1 tablespoon. General information  Eat more home-cooked food. Eat less restaurant, buffet, and fast food.  Limit or avoid alcohol.  Limit foods that are high in starch and sugar.  Avoid fried foods.  Lose weight if you are overweight.  Keep track of how much salt (sodium) you eat. This is important if you have high blood pressure. Ask your doctor to tell you more about this.  Try to add vegetarian meals each week. Fats  Choose healthy fats. These include olive oil and canola oil, flaxseeds, walnuts, almonds, and seeds.  Eat more omega-3 fats. These include salmon, mackerel, sardines, tuna, flaxseed oil, and ground flaxseeds. Try to eat fish at least 2 times each week.  Check food labels. Avoid foods with trans fats or high amounts of saturated fat.  Limit saturated fats. ? These are often found in animal products, such as meats, butter, and cream. ? These are also found in plant foods, such as palm oil, palm kernel oil, and coconut oil.  Avoid foods with partially hydrogenated oils in them. These have trans fats. Examples are stick margarine, some tub margarines, cookies, crackers, and other baked goods. What foods can I eat? Fruits All fresh, canned (in natural juice), or frozen fruits. Vegetables Fresh or frozen vegetables (raw, steamed, roasted, or grilled). Green salads. Grains Most grains. Choose whole wheat and whole grains most of the time. Rice and pasta, including Stephen rice and pastas made with whole wheat. Meats and other proteins Lean, well-trimmed beef, veal, pork, and lamb. Chicken and [place] without skin. All fish and shellfish. Wild duck, rabbit, pheasant, and venison. Egg whites or low-cholesterol egg substitutes. Dried beans, peas, lentils, and tofu. Seeds and most nuts. Dairy Low-fat or nonfat cheeses, including ricotta and mozzarella. Skim or 1% milk that is liquid, powdered, or  evaporated. Buttermilk that is made with low-fat milk. Nonfat or low-fat yogurt. Fats and oils Non-hydrogenated (trans-free) margarines. Vegetable oils, including soybean, sesame, sunflower, olive, peanut, safflower, corn, canola, and cottonseed. Salad dressings or mayonnaise made with a vegetable oil. Beverages Mineral water. Coffee and tea. Diet carbonated beverages. Sweets and desserts Sherbet, gelatin, and fruit ice. Small amounts of dark chocolate. Limit all sweets and desserts. Seasonings and condiments All seasonings and condiments. The items listed above may not be a complete list of foods and drinks you can eat. Contact a dietitian for more options. What foods should I avoid? Fruits Canned fruit in heavy syrup. Fruit in cream or butter sauce. Fried fruit. Limit coconut. Vegetables Vegetables cooked in cheese, cream, or butter sauce. Fried vegetables. Grains Breads that are made with saturated or trans fats, oils, or whole milk. Croissants. Sweet rolls. Donuts. High-fat crackers, such as cheese crackers. Meats and other proteins Fatty meats, such as hot dogs, ribs, sausage, bacon, rib-eye roast or steak. High-fat deli meats, such as salami and bologna. Caviar. Domestic duck and goose. Organ meats, such as liver. Dairy Cream, sour cream, cream cheese, and creamed cottage cheese. Whole-milk cheeses. Whole or 2% milk that is liquid, evaporated, or condensed. Whole buttermilk. Cream sauce or high-fat cheese sauce. Yogurt that is made from whole milk. Fats and oils Meat fat, or shortening. Cocoa butter, hydrogenated oils, palm oil, coconut oil, palm kernel oil. Solid fats and shortenings, including bacon fat, salt pork, lard, and butter. Nondairy cream substitutes. Salad dressings with cheese or sour cream. Beverages Regular sodas and juice drinks with added sugar. Sweets and desserts Frosting. Pudding. Cookies.  Cakes. Pies. Milk chocolate or white chocolate. Buttered syrups. Full-fat  ice cream or ice cream drinks. The items listed above may not be a complete list of foods and drinks to avoid. Contact a dietitian for more information. Summary  Heart-healthy meal planning includes eating less unhealthy fats, eating more healthy fats, and making other changes in your diet.  Eat a balanced diet. This includes fruits and vegetables, low-fat or nonfat dairy, lean protein, nuts and legumes, whole grains, and heart-healthy oils and fats. This information is not intended to replace advice given to you by your health care provider. Make sure you discuss any questions you have with your health care provider. Document Released: 12/24/2011 Document Revised: 08/01/2017 Document Reviewed: 08/01/2017 Elsevier Interactive Patient Education  2019 [employer].

## 2018-07-06 NOTE — Progress Notes (Signed)
Follow Up  Subjective:    Patient ID: [name], female    DOB: 09/10/1980, 37 y.o.   MRN: [id number]  HPI  Suzanne Graham is a 37 year old female with a past medical history of Stroke, Hypertension, Gout, Depression, and Asthma. She is here today for follow up.  Current Status: Since her last office visit she has no complaints. She has not been taking her hypertensive medications daily as prescribed. She denies visual changes, chest pain, cough, shortness of breath, heart palpitations, and falls. She has occasionally headaches and dizziness with position changes. Denies severe headaches, confusion, seizures, double vision, and blurred vision, nausea and vomiting.  She denies fevers, chills, fatigue, recent infections, weight loss, and night sweats. No reports of GI problems such as diarrhea, and constipation. She has no reports of blood in stools, dysuria and hematuria. No depression or anxiety reported. She denies pain today.   Review of Systems  Constitutional: Negative.   HENT: Negative.   Eyes: Negative.   Respiratory: Negative.   Cardiovascular: Negative.   Gastrointestinal: Negative.   Endocrine: Negative.   Genitourinary: Negative.   Musculoskeletal: Negative.   Skin: Negative.   Allergic/Immunologic: Negative.   Neurological: Positive for dizziness and headaches.  Hematological: Negative.   Psychiatric/Behavioral: Negative.    Objective:   Physical Exam Vitals signs and nursing note reviewed.  Constitutional:      Appearance: Normal appearance.  HENT:     Head: Normocephalic and atraumatic.     Nose: Nose normal.     Mouth/Throat:     Mouth: Mucous membranes are moist.     Pharynx: Oropharynx is clear.  Neck:     Musculoskeletal: Normal range of motion and neck supple.  Cardiovascular:     Rate and Rhythm: Normal rate and regular rhythm.     Pulses: Normal pulses.     Heart sounds: Normal heart sounds.  Pulmonary:     Effort: Pulmonary effort is normal.   Breath sounds: Normal breath sounds.  Abdominal:     General: Bowel sounds are normal. There is distension (Obese).     Palpations: Abdomen is soft.  Neurological:     Mental Status: She is alert.    Assessment & Plan:   1. Essential hypertension Blood pressures are elevated today. She has not been taking antihypertensive medications as prescribed. Patient received Clonidine 0.3 mg in office today. Patient advised to report to ED for further evaluation of blood pressures. Patient refused to report to ED, and signed AMA statement in office today. He denies visual changes, chest pain, cough, shortness of breath, heart palpitations, and falls. She  will report to ED if she develops increased dizziness, severe headaches, confusion, seizures, double vision, and blurred vision, nausea and vomiting. Patient verbalized understanding. She will re-start HCTZ and Amlodipine as prescribed. She will continue to decrease high sodium intake, excessive alcohol intake, increase potassium intake, smoking cessation, and increase physical activity of at least 30 minutes of cardio activity daily. She will continue to follow Heart Healthy or DASH diet. - POCT urinalysis dipstick - cloNIDine (CATAPRES) tablet 0.2 mg - amLODipine (NORVASC) 10 MG tablet; Take 1 tablet (10 mg total) by mouth daily.  Dispense: 30 tablet; Refill: 1 - cloNIDine (CATAPRES) tablet 0.1 mg  2. Abnormal urinalysis Urine Culture is pending.  - Urine Culture  3. Follow up She will follow up in 3 months.   Meds ordered this encounter  Medications  . cloNIDine (CATAPRES) tablet 0.2 mg  . amLODipine (  NORVASC) 10 MG tablet    Sig: Take 1 tablet (10 mg total) by mouth daily.    Dispense:  30 tablet    Refill:  1  . cloNIDine (CATAPRES) tablet 0.1 mg   Raliegh IpNatalie Krysten Veronica,  MSN, FNP-C [facility] [address]  [place], [place] [address] [phone]

## 2018-07-08 LAB — URINE CULTURE

## 2018-07-08 LAB — CUP PACEART REMOTE DEVICE CHECK
Date Time Interrogation Session: 20191203184038
MDC IDC PG IMPLANT DT: 20190212

## 2018-07-09 ENCOUNTER — Encounter: Payer: Self-pay | Admitting: Family Medicine

## 2018-07-10 ENCOUNTER — Ambulatory Visit: Payer: Self-pay | Admitting: Family Medicine

## 2018-07-10 VITALS — BP 142/86 | HR 84 | Wt 219.0 lb

## 2018-07-10 DIAGNOSIS — Z0130 Encounter for examination of blood pressure without abnormal findings: Secondary | ICD-10-CM

## 2018-07-10 NOTE — Progress Notes (Signed)
Patient advise to continue her medication as prescribed and to follow up in 3 months.

## 2018-07-13 ENCOUNTER — Ambulatory Visit (INDEPENDENT_AMBULATORY_CARE_PROVIDER_SITE_OTHER): Payer: Self-pay

## 2018-07-13 DIAGNOSIS — I639 Cerebral infarction, unspecified: Secondary | ICD-10-CM

## 2018-07-13 LAB — CUP PACEART REMOTE DEVICE CHECK
Date Time Interrogation Session: 20200105194103
Implantable Pulse Generator Implant Date: 20190212

## 2018-07-14 NOTE — Progress Notes (Signed)
Carelink Summary Report / Loop Recorder 

## 2018-08-07 MED FILL — AMLODIPINE BESYLATE 10 MG T: 10 | 30 days supply | Qty: 30 | Fill #1

## 2018-08-10 MED FILL — HYDROCHLOROTHIAZIDE 25 MG T: 25 | 30 days supply | Qty: 30 | Fill #3

## 2018-08-11 ENCOUNTER — Encounter: Payer: Self-pay | Admitting: Speech Pathology

## 2018-08-11 NOTE — Therapy (Signed)
[facility] [address] [place], [place], [address] Phone: [phone]   Fax:  [phone]  Patient Details  Name: Suzanne Graham MRN: [id number] Date of Birth: 09/19/1980 Referring Provider:  No ref. provider found  Encounter Date: 08/11/2018     SPEECH THERAPY DISCHARGE SUMMARY  Visits from Start of Care: 2  Current functional level related to goals / functional outcomes: See goals below   Remaining deficits: Engineer, civil (consulting) / Equipment: Compensations for memory and attention Plan: Patient agrees to discharge.  Patient goals were not met. Patient is being discharged due to not returning since the last visit.  ?????          SLP Long Term Goals - 08/11/18 1309      SLP LONG TERM GOAL #1   Title  pt will report successful use of a memory strategy between two therapy sessions    Time  4    Period  Weeks   or 4 visits   Status  Not met     SLP LONG TERM GOAL #2   Title  pt will demo [facility] memory skills in two sessions using compensations for memory    Time  4    Period  Weeks   or 4 visits   Status Not met     SLP LONG TERM GOAL #3   Title  pt will tell SLP 4 memory strategies she could use with modified independence    Time  4    Period  Weeks   or 4 visits   Status  Not met        Augustina Braddock, Annye Rusk [place], [place] 08/11/2018, 1:09 PM  [facility] [address] [place], [place], [address] Phone: [phone]   Fax:  [phone]

## 2018-08-14 ENCOUNTER — Ambulatory Visit (INDEPENDENT_AMBULATORY_CARE_PROVIDER_SITE_OTHER): Payer: Self-pay

## 2018-08-14 DIAGNOSIS — I639 Cerebral infarction, unspecified: Secondary | ICD-10-CM

## 2018-08-16 LAB — CUP PACEART REMOTE DEVICE CHECK
Date Time Interrogation Session: 20200207193916
Implantable Pulse Generator Implant Date: 20190212

## 2018-08-25 NOTE — Progress Notes (Signed)
Carelink Summary Report / Loop Recorder 

## 2018-08-31 ENCOUNTER — Encounter (HOSPITAL_COMMUNITY): Payer: Self-pay

## 2018-08-31 ENCOUNTER — Emergency Department (HOSPITAL_COMMUNITY)
Admission: EM | Admit: 2018-08-31 | Discharge: 2018-08-31 | Disposition: A | Discharge status: Home / Self Care | Payer: Self-pay | Attending: Emergency Medicine | Admitting: Emergency Medicine

## 2018-08-31 DIAGNOSIS — J45909 Unspecified asthma, uncomplicated: Secondary | ICD-10-CM | POA: Insufficient documentation

## 2018-08-31 DIAGNOSIS — Z79899 Other long term (current) drug therapy: Secondary | ICD-10-CM | POA: Insufficient documentation

## 2018-08-31 DIAGNOSIS — M109 Gout, unspecified: Secondary | ICD-10-CM

## 2018-08-31 DIAGNOSIS — M10071 Idiopathic gout, right ankle and foot: Secondary | ICD-10-CM | POA: Insufficient documentation

## 2018-08-31 DIAGNOSIS — F17210 Nicotine dependence, cigarettes, uncomplicated: Secondary | ICD-10-CM | POA: Insufficient documentation

## 2018-08-31 DIAGNOSIS — I10 Essential (primary) hypertension: Secondary | ICD-10-CM | POA: Insufficient documentation

## 2018-08-31 DIAGNOSIS — M10471 Other secondary gout, right ankle and foot: Secondary | ICD-10-CM

## 2018-08-31 DIAGNOSIS — I639 Cerebral infarction, unspecified: Secondary | ICD-10-CM

## 2018-08-31 DIAGNOSIS — M5432 Sciatica, left side: Secondary | ICD-10-CM | POA: Insufficient documentation

## 2018-08-31 MED ORDER — ALLOPURINOL 100 MG PO TABS: 100.0000 mg | ORAL_TABLET | Freq: Every day | ORAL | 3 refills | Status: DC

## 2018-08-31 MED ORDER — OXYCODONE-ACETAMINOPHEN 5-325 MG PO TABS
1.0000 | ORAL_TABLET | Freq: Once | ORAL | Status: AC
  Administered 2018-08-31: 1 via ORAL
  Filled 2018-08-31: qty 1

## 2018-08-31 MED ORDER — OXYCODONE-ACETAMINOPHEN 5-325 MG PO TABS: 1.0000 | ORAL_TABLET | Freq: Four times a day (QID) | ORAL | 0 refills | Status: DC | PRN

## 2018-08-31 MED FILL — ALLOPURINOL 100 MG TABLET: 100 | 30 days supply | Qty: 30 | Fill #0

## 2018-08-31 NOTE — ED Notes (Signed)
See EDP assessment 

## 2018-08-31 NOTE — ED Triage Notes (Signed)
Pt presents for evaluation of L upper leg pain x 1 day. Pt states she had gout pain to R foot 3 days ago but now has pain to L thigh. No fall or trauma.

## 2018-08-31 NOTE — ED Provider Notes (Signed)
[facility] EMERGENCY DEPARTMENT Provider Note   CSN: [id number] Arrival date & time: 08/31/18  0845   History   Chief Complaint Chief Complaint  Patient presents with  . Leg Pain    HPI Suzanne Graham is a 38 y.o. female.     HPI   38 year old female presents today with complaints of left thigh pain.  Patient notes proximate 3 days ago she developed pain in her right foot, she notes this is identical to previous gout flares.  She notes she was taking allopurinol but stopped taking it because she was not having frequent flares.  She notes she has been drinking alcohol recently and eating meats.  She denies any fever or pain to the remainder of the upper right extremity.  She notes she had minor pain in her thigh several days ago this is transferred to the left leg, she notes pain in the posterior aspect and anterior aspect worse with flexion at the hip and extension at the knee, no swelling or edema, no history DVT.  Past Medical History:  Diagnosis Date  . Asthma   . Depression   . Gout   . Hypertension   . Stroke [facility])     Patient Active Problem List   Diagnosis Date Noted  . Chronic nonintractable headache 08/06/2017  . Essential hypertension 08/06/2017  . Stroke [facility]) 07/17/2017    Past Surgical History:  Procedure Laterality Date  . LOOP RECORDER INSERTION N/A 08/19/2017   Procedure: LOOP RECORDER INSERTION;  Surgeon: Hillis Range, MD;  [facility] INV[facility] CV LAB;  Service: Cardiovascular;  Laterality: N/A;  . TEE WITHOUT CARDIOVERSION N/A 08/19/2017   Procedure: TRANSESOPHAGEAL ECHOCARDIOGRAM (TEE);  Surgeon: Pricilla Riffle, MD;  Location: [facility] ENDOSCOPY;  Service: Cardiovascular;  Laterality: N/A;     OB History   No obstetric history on file.      Home Medications    Prior to Admission medications   Medication Sig Start Date End Date Taking? Authorizing Provider  albuterol (PROVENTIL HFA;VENTOLIN HFA) 108 (90 Base) MCG/ACT inhaler  Inhale 2 puffs into the lungs every 4 (four) hours as needed for wheezing or shortness of breath. 06/10/18   Aviva Kluver B, PA-C  allopurinol (ZYLOPRIM) 100 MG tablet Take 1 tablet (100 mg total) by mouth daily. 08/31/18   Cherene Dobbins, Tinnie Gens, PA-C  amLODipine (NORVASC) 10 MG tablet Take 1 tablet (10 mg total) by mouth daily. 07/06/18   Kallie Locks, FNP  atorvastatin (LIPITOR) 20 MG tablet Take 1 tablet (20 mg total) by mouth daily at 6 PM. 03/30/18   Kallie Locks, FNP  hydrochlorothiazide (HYDRODIURIL) 25 MG tablet Take 1 tablet (25 mg total) by mouth daily. 03/30/18 07/28/18  Kallie Locks, FNP  ibuprofen (ADVIL,MOTRIN) 200 MG tablet Take 400 mg by mouth every 6 (six) hours as needed for mild pain.    [provider]  oxyCODONE-acetaminophen (PERCOCET/ROXICET) 5-325 MG tablet Take 1 tablet by mouth every 6 (six) hours as needed for severe pain. 08/31/18   Eyvonne Mechanic, PA-C    Family History Family History  Problem Relation Age of Onset  . Heart failure Mother   . Hypertension Mother   . Stroke Neg Hx     Social History Social History   Tobacco Use  . Smoking status: Current Every Day Smoker    Packs/day: 0.25    Types: Cigarettes  . Smokeless tobacco: Never Used  Substance Use Topics  . Alcohol use: Yes    Comment: occasinally  .  Drug use: No     Allergies   Patient has no known allergies.   Review of Systems Review of Systems  All other systems reviewed and are negative.    Physical Exam Updated Vital Signs BP (!) 149/97 (BP Location: Left Arm)   Pulse 90   Temp 98.2 F (36.8 C) (Oral)   Resp 17   SpO2 99%   Physical Exam Vitals signs and nursing note reviewed.  Constitutional:      Appearance: She is well-developed.  HENT:     Head: Normocephalic and atraumatic.  Eyes:     General: No scleral icterus.       Right eye: No discharge.        Left eye: No discharge.     Conjunctiva/sclera: Conjunctivae normal.     Pupils: Pupils are  equal, round, and reactive to light.  Neck:     Musculoskeletal: Normal range of motion.     Vascular: No JVD.     Trachea: No tracheal deviation.  Pulmonary:     Effort: Pulmonary effort is normal.     Breath sounds: No stridor.  Musculoskeletal:     Comments: Minor swelling of the right dorsal foot, no erythema, minor tenderness to palpation-remainder right lower extremity atraumatic  Left lower extremity without swelling or edema, minor tenderness palpation of the mid thigh, straight leg positive, no distal edema, no midline lower back pain.  Neurological:     Mental Status: She is alert and oriented to person, place, and time.     Coordination: Coordination normal.  Psychiatric:        Behavior: Behavior normal.        Thought Content: Thought content normal.        Judgment: Judgment normal.      ED Treatments / Results  Labs (all labs ordered are listed, but only abnormal results are displayed) Labs Reviewed - No data to display  EKG None  Radiology No results found.  Procedures Procedures (including critical care time)  Medications Ordered in ED Medications  oxyCODONE-acetaminophen (PERCOCET/ROXICET) 5-325 MG per tablet 1 tablet (1 tablet Oral Given 08/31/18 0944)     Initial Impression / Assessment and Plan / ED Course  I have reviewed the triage vital signs and the nursing notes.  Pertinent labs & imaging results that were available during my care of the patient were reviewed by me and considered in my medical decision making (see chart for details).        Labs:   Imaging:  Consults:  Therapeutics:  Discharge Meds:   Assessment/Plan: 38 year old female presents today with gout flare and leg pain.  Patient will be given a short course of pain medication encouraged use anti-inflammatories, restart allopurinol and follow-up with her primary care as needed.  Strict return precautions given.  No signs of DVT or any acute life-threatening etiology.   Patient verbalized understanding and agreement to today's plan.      Final Clinical Impressions(s) / ED Diagnoses   Final diagnoses:  Acute gout of right foot, unspecified cause  Sciatica of left side    ED Discharge Orders         Ordered    allopurinol (ZYLOPRIM) 100 MG tablet  Daily     08/31/18 0947    oxyCODONE-acetaminophen (PERCOCET/ROXICET) 5-325 MG tablet  Every 6 hours PRN     08/31/18 0947           Eyvonne Mechanic, PA-C 08/31/18 0948    Rubin Payor,  Harrold Donath, MD 08/31/18 [phone]

## 2018-09-01 ENCOUNTER — Emergency Department (HOSPITAL_COMMUNITY)
Admission: EM | Admit: 2018-09-01 | Discharge: 2018-09-01 | Disposition: A | Discharge status: Home / Self Care | Payer: Self-pay | Attending: Emergency Medicine | Admitting: Emergency Medicine

## 2018-09-01 ENCOUNTER — Other Ambulatory Visit: Payer: Self-pay

## 2018-09-01 ENCOUNTER — Encounter (HOSPITAL_COMMUNITY): Payer: Self-pay | Admitting: Emergency Medicine

## 2018-09-01 ENCOUNTER — Telehealth: Payer: Self-pay | Admitting: *Deleted

## 2018-09-01 DIAGNOSIS — Z79899 Other long term (current) drug therapy: Secondary | ICD-10-CM | POA: Insufficient documentation

## 2018-09-01 DIAGNOSIS — J45909 Unspecified asthma, uncomplicated: Secondary | ICD-10-CM | POA: Insufficient documentation

## 2018-09-01 DIAGNOSIS — M109 Gout, unspecified: Secondary | ICD-10-CM | POA: Insufficient documentation

## 2018-09-01 DIAGNOSIS — F17210 Nicotine dependence, cigarettes, uncomplicated: Secondary | ICD-10-CM | POA: Insufficient documentation

## 2018-09-01 DIAGNOSIS — I10 Essential (primary) hypertension: Secondary | ICD-10-CM | POA: Insufficient documentation

## 2018-09-01 MED ORDER — ONDANSETRON 4 MG PO TBDP
4.0000 mg | ORAL_TABLET | Freq: Once | ORAL | Status: AC
  Administered 2018-09-01: 4 mg via ORAL
  Filled 2018-09-01: qty 1

## 2018-09-01 MED ORDER — OXYCODONE-ACETAMINOPHEN 5-325 MG PO TABS: 1.0000 | ORAL_TABLET | Freq: Four times a day (QID) | ORAL | 0 refills | Status: DC | PRN

## 2018-09-01 MED ORDER — OXYCODONE-ACETAMINOPHEN 5-325 MG PO TABS
1.0000 | ORAL_TABLET | Freq: Once | ORAL | Status: AC
  Administered 2018-09-01: 1 via ORAL
  Filled 2018-09-01: qty 1

## 2018-09-01 MED ORDER — LIDOCAINE HCL 2 % IJ SOLN: 20.0000 mL | Freq: Once | INTRAMUSCULAR | Status: DC

## 2018-09-01 MED ORDER — INDOMETHACIN 25 MG PO CAPS: 25.0000 mg | ORAL_CAPSULE | Freq: Three times a day (TID) | ORAL | 0 refills | Status: DC | PRN

## 2018-09-01 MED FILL — INDOMETHACIN 25 MG CAPSULE: 25 | 10 days supply | Qty: 30 | Fill #0

## 2018-09-01 NOTE — ED Triage Notes (Signed)
Seen one day ago for right leg pain stated diagnosis of gout. States feels better however has left leg pain now. Pain 10/10 achy sore sharp.

## 2018-09-01 NOTE — Discharge Instructions (Addendum)
Please read the attached information.  Please return immediately if develop any new or worsening signs or symptoms.  If your symptoms do not improve in 24 to 36 hours return to the emergency room.

## 2018-09-01 NOTE — ED Notes (Signed)
Patient verbalized understanding of discharge instructions and prescription medications and denies any further needs or questions at this time. VS stable. Patient ambulatory with steady gait. Assisted to ED entrance in wheelchair.

## 2018-09-01 NOTE — Telephone Encounter (Signed)
EDCM called pt to see which pharmacy she wanted Rx e-scribed to.  Will update EDP when information comes available.

## 2018-09-01 NOTE — ED Provider Notes (Signed)
[facility] EMERGENCY DEPARTMENT Provider Note   CSN: [id number] Arrival date & time: 09/01/18  1127    History   Chief Complaint Chief Complaint  Patient presents with  . Leg Pain    HPI Suzanne Graham is a 38 y.o. female.     HPI   38 year old female presents today with complaints of left-sided leg pain.  Patient was seen yesterday with gout in her right foot and also leg pain.  She was discharged at that time with pain medication.  Patient notes that the pain is localized to her left knee, she notes pain with range of motion and swelling.  She denies any trauma to the knee.  She denies any fever chills nausea or vomiting.  No medications prior to arrival today.  Patient notes a significant past medical history of gout, she is just getting over a gout flare in her right foot, she notes she has had gout flares in the ankles bilateral but has not had one in her knee previously.  Past Medical History:  Diagnosis Date  . Asthma   . Depression   . Gout   . Hypertension   . Stroke [facility])     Patient Active Problem List   Diagnosis Date Noted  . Chronic nonintractable headache 08/06/2017  . Essential hypertension 08/06/2017  . Stroke [facility]) 07/17/2017    Past Surgical History:  Procedure Laterality Date  . LOOP RECORDER INSERTION N/A 08/19/2017   Procedure: LOOP RECORDER INSERTION;  Surgeon: Hillis Range, MD;  Location: [facility] INVASIVE CV LAB;  Service: Cardiovascular;  Laterality: N/A;  . TEE WITHOUT CARDIOVERSION N/A 08/19/2017   Procedure: TRANSESOPHAGEAL ECHOCARDIOGRAM (TEE);  Surgeon: Pricilla Riffle, MD;  Location: [facility] ENDOSCOPY;  Service: Cardiovascular;  Laterality: N/A;     OB History   No obstetric history on file.      Home Medications    Prior to Admission medications   Medication Sig Start Date End Date Taking? Authorizing Provider  albuterol (PROVENTIL HFA;VENTOLIN HFA) 108 (90 Base) MCG/ACT inhaler Inhale 2 puffs into the lungs every 4  (four) hours as needed for wheezing or shortness of breath. 06/10/18   Aviva Kluver B, PA-C  allopurinol (ZYLOPRIM) 100 MG tablet Take 1 tablet (100 mg total) by mouth daily. 08/31/18   Nataniel Gasper, Tinnie Gens, PA-C  amLODipine (NORVASC) 10 MG tablet Take 1 tablet (10 mg total) by mouth daily. 07/06/18   Kallie Locks, FNP  atorvastatin (LIPITOR) 20 MG tablet Take 1 tablet (20 mg total) by mouth daily at 6 PM. 03/30/18   Kallie Locks, FNP  hydrochlorothiazide (HYDRODIURIL) 25 MG tablet Take 1 tablet (25 mg total) by mouth daily. 03/30/18 07/28/18  Kallie Locks, FNP  ibuprofen (ADVIL,MOTRIN) 200 MG tablet Take 400 mg by mouth every 6 (six) hours as needed for mild pain.    [provider]  indomethacin (INDOCIN) 25 MG capsule Take 1 capsule (25 mg total) by mouth 3 (three) times daily as needed. 09/01/18   Burnis Kaser, Tinnie Gens, PA-C  oxyCODONE-acetaminophen (PERCOCET/ROXICET) 5-325 MG tablet Take 1 tablet by mouth every 6 (six) hours as needed. 09/01/18   Eyvonne Mechanic, PA-C    Family History Family History  Problem Relation Age of Onset  . Heart failure Mother   . Hypertension Mother   . Stroke Neg Hx     Social History Social History   Tobacco Use  . Smoking status: Current Every Day Smoker    Packs/day: 0.25    Types: Cigarettes  .  Smokeless tobacco: Never Used  Substance Use Topics  . Alcohol use: Yes    Comment: occasinally  . Drug use: No     Allergies   Patient has no known allergies.   Review of Systems Review of Systems  All other systems reviewed and are negative.   Physical Exam Updated Vital Signs BP 136/79 (BP Location: Right Arm)   Pulse 90   Temp 98.6 F (37 C) (Oral)   Resp 17   Ht 5\' 4"  (1.626 m)   Wt 80 kg   SpO2 100%   BMI 30.27 kg/m   Physical Exam Vitals signs and nursing note reviewed.  Constitutional:      Appearance: She is well-developed.  HENT:     Head: Normocephalic and atraumatic.  Eyes:     General: No scleral icterus.        Right eye: No discharge.        Left eye: No discharge.     Conjunctiva/sclera: Conjunctivae normal.     Pupils: Pupils are equal, round, and reactive to light.  Neck:     Musculoskeletal: Normal range of motion.     Vascular: No JVD.     Trachea: No tracheal deviation.  Pulmonary:     Effort: Pulmonary effort is normal.     Breath sounds: No stridor.  Musculoskeletal:     Comments: Swelling noted to the left knee, no redness warmth to touch or significant tenderness, pain with flexion and extension 80 degrees of flexion noted at the left knee  Neurological:     Mental Status: She is alert and oriented to person, place, and time.     Coordination: Coordination normal.  Psychiatric:        Behavior: Behavior normal.        Thought Content: Thought content normal.        Judgment: Judgment normal.     ED Treatments / Results  Labs (all labs ordered are listed, but only abnormal results are displayed) Labs Reviewed - No data to display  EKG None  Radiology No results found.  Procedures Procedures (including critical care time)  Medications Ordered in ED Medications  oxyCODONE-acetaminophen (PERCOCET/ROXICET) 5-325 MG per tablet 1 tablet (1 tablet Oral Given 09/01/18 1249)  ondansetron (ZOFRAN-ODT) disintegrating tablet 4 mg (4 mg Oral Given 09/01/18 1251)     Initial Impression / Assessment and Plan / ED Course  I have reviewed the triage vital signs and the nursing notes.  Pertinent labs & imaging results that were available during my care of the patient were reviewed by me and considered in my medical decision making (see chart for details).        38 year old female presents today with complaints of left knee pain.  She has a joint effusion noted on my exam.  She has no redness or warmth to touch.  Patient does have a history of gout, recently getting over a gout flare.  I have high suspicion for gout in this knee.  I have lower suspicion for septic arthritis  given the fact that she is afebrile and otherwise well-appearing.  I discussed with the patient the differential diagnosis and treatment options.  I discussed care with attending physician Dr. Ranae Palms who agreed that at this time given the presentation it would be reasonable to treat for presumed gout.  Patient will be discharged with indomethacin, pain medication.  If her symptoms do not improve within 24 to 36 hours she will return to the emergency  room if they worsen she will return immediately.  She had no further questions or concerns at the time of discharge.    Final Clinical Impressions(s) / ED Diagnoses   Final diagnoses:  Acute gout of left knee, unspecified cause    ED Discharge Orders         Ordered    oxyCODONE-acetaminophen (PERCOCET/ROXICET) 5-325 MG tablet  Every 6 hours PRN     09/01/18 1355    indomethacin (INDOCIN) 25 MG capsule  3 times daily PRN     09/01/18 1356           Eyvonne Mechanic, PA-C 09/01/18 1420    Loren Racer, MD 09/05/18 [phone]

## 2018-09-07 ENCOUNTER — Other Ambulatory Visit: Payer: Self-pay | Admitting: Family Medicine

## 2018-09-07 DIAGNOSIS — I10 Essential (primary) hypertension: Secondary | ICD-10-CM

## 2018-09-09 ENCOUNTER — Other Ambulatory Visit: Payer: Self-pay

## 2018-09-09 DIAGNOSIS — I10 Essential (primary) hypertension: Secondary | ICD-10-CM

## 2018-09-09 MED ORDER — HYDROCHLOROTHIAZIDE 25 MG PO TABS: 25.0000 mg | ORAL_TABLET | Freq: Every day | ORAL | 3 refills | Status: DC

## 2018-09-09 MED ORDER — AMLODIPINE BESYLATE 10 MG PO TABS: 10.0000 mg | ORAL_TABLET | Freq: Every day | ORAL | 2 refills | Status: DC

## 2018-09-09 MED FILL — HYDROCHLOROTHIAZIDE 25 MG T: 25 | 30 days supply | Qty: 30 | Fill #0

## 2018-09-09 MED FILL — AMLODIPINE BESYLATE 10 MG T: 10 | 30 days supply | Qty: 30 | Fill #0

## 2018-09-09 NOTE — Telephone Encounter (Signed)
Medication sent to pharmacy  

## 2018-09-16 ENCOUNTER — Ambulatory Visit (INDEPENDENT_AMBULATORY_CARE_PROVIDER_SITE_OTHER): Payer: Self-pay | Admitting: *Deleted

## 2018-09-16 DIAGNOSIS — I639 Cerebral infarction, unspecified: Secondary | ICD-10-CM

## 2018-09-17 LAB — CUP PACEART REMOTE DEVICE CHECK
Date Time Interrogation Session: 20200311204049
Implantable Pulse Generator Implant Date: 20190212

## 2018-09-23 NOTE — Progress Notes (Signed)
Carelink Summary Report / Loop Recorder 

## 2018-10-07 MED FILL — HYDROCHLOROTHIAZIDE 25 MG T: 25 | 30 days supply | Qty: 30 | Fill #1

## 2018-10-07 MED FILL — AMLODIPINE BESYLATE 10 MG T: 10 | 30 days supply | Qty: 30 | Fill #1

## 2018-10-09 ENCOUNTER — Ambulatory Visit (INDEPENDENT_AMBULATORY_CARE_PROVIDER_SITE_OTHER): Payer: Self-pay | Admitting: Family Medicine

## 2018-10-09 ENCOUNTER — Encounter: Payer: Self-pay | Admitting: Family Medicine

## 2018-10-09 ENCOUNTER — Other Ambulatory Visit: Payer: Self-pay

## 2018-10-09 DIAGNOSIS — M1A0790 Idiopathic chronic gout, unspecified ankle and foot, without tophus (tophi): Secondary | ICD-10-CM

## 2018-10-09 DIAGNOSIS — F17200 Nicotine dependence, unspecified, uncomplicated: Secondary | ICD-10-CM

## 2018-10-09 DIAGNOSIS — I639 Cerebral infarction, unspecified: Secondary | ICD-10-CM

## 2018-10-09 DIAGNOSIS — Z09 Encounter for follow-up examination after completed treatment for conditions other than malignant neoplasm: Secondary | ICD-10-CM

## 2018-10-09 DIAGNOSIS — I10 Essential (primary) hypertension: Secondary | ICD-10-CM

## 2018-10-09 DIAGNOSIS — R52 Pain, unspecified: Secondary | ICD-10-CM

## 2018-10-09 MED ORDER — ATORVASTATIN CALCIUM 20 MG PO TABS: 20.0000 mg | ORAL_TABLET | Freq: Every day | ORAL | 3 refills | Status: DC

## 2018-10-09 MED ORDER — INDOMETHACIN 25 MG PO CAPS: 25.0000 mg | ORAL_CAPSULE | Freq: Three times a day (TID) | ORAL | 3 refills | Status: DC | PRN

## 2018-10-09 MED FILL — HYDROCHLOROTHIAZIDE 25 MG T: 25 | 30 days supply | Qty: 30 | Fill #2

## 2018-10-09 MED FILL — AMLODIPINE BESYLATE 10 MG T: 10 | 30 days supply | Qty: 30 | Fill #2

## 2018-10-09 NOTE — Progress Notes (Signed)
Virtual Visit via Telephone Note  I connected with Suzanne Graham on 10/09/18 at 10:20 AM EDT by telephone and verified that I am speaking with the correct person using two identifiers.   I discussed the limitations, risks, security and privacy concerns of performing an evaluation and management service by telephone and the availability of in person appointments. I also discussed with the patient that there may be a patient responsible charge related to this service. The patient expressed understanding and agreed to proceed.   History of Present Illness: Past Medical History:  Diagnosis Date  . Asthma   . Depression   . Gout   . Hypertension   . Stroke [facility])     Current Outpatient Medications on File Prior to Visit  Medication Sig Dispense Refill  . albuterol (PROVENTIL HFA;VENTOLIN HFA) 108 (90 Base) MCG/ACT inhaler Inhale 2 puffs into the lungs every 4 (four) hours as needed for wheezing or shortness of breath. 1 Inhaler 1  . allopurinol (ZYLOPRIM) 100 MG tablet Take 1 tablet (100 mg total) by mouth daily. 30 tablet 3  . amLODipine (NORVASC) 10 MG tablet Take 1 tablet (10 mg total) by mouth daily. 30 tablet 2  . atorvastatin (LIPITOR) 20 MG tablet Take 1 tablet (20 mg total) by mouth daily at 6 PM. 30 tablet 3  . hydrochlorothiazide (HYDRODIURIL) 25 MG tablet Take 1 tablet (25 mg total) by mouth daily. 30 tablet 3  . ibuprofen (ADVIL,MOTRIN) 200 MG tablet Take 400 mg by mouth every 6 (six) hours as needed for mild pain.    . indomethacin (INDOCIN) 25 MG capsule Take 1 capsule (25 mg total) by mouth 3 (three) times daily as needed. 30 capsule 0  . oxyCODONE-acetaminophen (PERCOCET/ROXICET) 5-325 MG tablet Take 1 tablet by mouth every 6 (six) hours as needed. 10 tablet 0   No current facility-administered medications on file prior to visit.    Current Status: Since her last office visit, she is currently not checking her blood pressures at home. She states that she is taking  antihypertensive medications as prescribed. She denies visual changes, chest pain, cough, shortness of breath, heart palpitations, and falls. She has occasional headaches and dizziness with position changes. Denies severe headaches, confusion, seizures, double vision, and blurred vision, nausea and vomiting.  She denies fevers, chills, fatigue, recent infections, weight loss, and night sweats. She has not had any visual changes, and falls. No chest pain, heart palpitations, cough and shortness of breath reported. No reports of GI problems such as diarrhea, and constipation. She has no reports of blood in stools, dysuria and hematuria. No depression or anxiety reported today. She denies pain today.   Observations/Objective:  Telephone Virtual Visit.    Assessment and Plan:   Follow Up Instructions: She will follow up on Monday, 10/12/2018 for blood pressure check only. She will follow up in 1 month for office visit.    I discussed the assessment and treatment plan with the patient. The patient was provided an opportunity to ask questions and all were answered. The patient agreed with the plan and demonstrated an understanding of the instructions.   The patient was advised to call back or seek an in-person evaluation if the symptoms worsen or if the condition fails to improve as anticipated.  I provided 15-20 minutes of non-face-to-face time during this encounter.   Kallie Locks, FNP

## 2018-10-10 MED FILL — ATORVASTATIN 20 MG TABLET: 20 | 30 days supply | Qty: 30 | Fill #0

## 2018-10-10 MED FILL — INDOMETHACIN 25 MG CAPSULE: 25 | 10 days supply | Qty: 30 | Fill #0

## 2018-10-11 DIAGNOSIS — R52 Pain, unspecified: Secondary | ICD-10-CM | POA: Insufficient documentation

## 2018-10-11 DIAGNOSIS — F17200 Nicotine dependence, unspecified, uncomplicated: Secondary | ICD-10-CM | POA: Insufficient documentation

## 2018-10-11 DIAGNOSIS — M1A0790 Idiopathic chronic gout, unspecified ankle and foot, without tophus (tophi): Secondary | ICD-10-CM | POA: Insufficient documentation

## 2018-10-19 ENCOUNTER — Other Ambulatory Visit: Payer: Self-pay

## 2018-10-19 ENCOUNTER — Ambulatory Visit: Payer: Self-pay | Admitting: *Deleted

## 2018-10-20 LAB — CUP PACEART REMOTE DEVICE CHECK
Date Time Interrogation Session: 20200413214215
Implantable Pulse Generator Implant Date: 20190212

## 2018-11-13 ENCOUNTER — Ambulatory Visit (INDEPENDENT_AMBULATORY_CARE_PROVIDER_SITE_OTHER): Payer: Self-pay | Admitting: Family Medicine

## 2018-11-13 ENCOUNTER — Other Ambulatory Visit: Payer: Self-pay

## 2018-11-13 DIAGNOSIS — R52 Pain, unspecified: Secondary | ICD-10-CM

## 2018-11-13 DIAGNOSIS — M1A0790 Idiopathic chronic gout, unspecified ankle and foot, without tophus (tophi): Secondary | ICD-10-CM

## 2018-11-13 DIAGNOSIS — I639 Cerebral infarction, unspecified: Secondary | ICD-10-CM

## 2018-11-13 DIAGNOSIS — I10 Essential (primary) hypertension: Secondary | ICD-10-CM

## 2018-11-13 DIAGNOSIS — Z09 Encounter for follow-up examination after completed treatment for conditions other than malignant neoplasm: Secondary | ICD-10-CM

## 2018-11-13 NOTE — Progress Notes (Signed)
Virtual Visit via Telephone Note  I connected with Colin BentonShawnell Koy on 11/14/18 at 10:20 AM EDT by telephone and verified that I am speaking with the correct person using two identifiers.   I discussed the limitations, risks, security and privacy concerns of performing an evaluation and management service by telephone and the availability of in person appointments. I also discussed with the patient that there may be a patient responsible charge related to this service. The patient expressed understanding and agreed to proceed.   History of Present Illness:  Past Medical History:  Diagnosis Date  . Asthma   . Depression   . Gout   . Hypertension   . Stroke [facility](HCC)     Current Outpatient Medications on File Prior to Visit  Medication Sig Dispense Refill  . albuterol (PROVENTIL HFA;VENTOLIN HFA) 108 (90 Base) MCG/ACT inhaler Inhale 2 puffs into the lungs every 4 (four) hours as needed for wheezing or shortness of breath. 1 Inhaler 1  . amLODipine (NORVASC) 10 MG tablet Take 1 tablet (10 mg total) by mouth daily. 30 tablet 2  . atorvastatin (LIPITOR) 20 MG tablet Take 1 tablet (20 mg total) by mouth daily at 6 PM. 30 tablet 3  . hydrochlorothiazide (HYDRODIURIL) 25 MG tablet Take 1 tablet (25 mg total) by mouth daily. 30 tablet 3  . ibuprofen (ADVIL,MOTRIN) 200 MG tablet Take 400 mg by mouth every 6 (six) hours as needed for mild pain.    . indomethacin (INDOCIN) 25 MG capsule Take 1 capsule (25 mg total) by mouth 3 (three) times daily as needed. 30 capsule 3  . allopurinol (ZYLOPRIM) 100 MG tablet Take 1 tablet (100 mg total) by mouth daily. (Patient not taking: Reported on 11/13/2018) 30 tablet 3   No current facility-administered medications on file prior to visit.     Current Status: Since her last office visit, she is doing well with no complaints. She has not been monitoring her blood pressure at home. She denies visual changes, chest pain, cough, shortness of breath, heart palpitations,  and falls. She has occasional headaches and dizziness with position changes. Denies severe headaches, confusion, seizures, double vision, and blurred vision, nausea and vomiting. She continues to take Allopurinol as needed. For gouty attacks, which she has not had any recently.   She denies fevers, chills, fatigue, recent infections, weight loss, and night sweats. No reports of GI problems such as diarrhea, and constipation. She has no reports of blood in stools, dysuria and hematuria. No depression or anxiety reported. She denies pain today.    Observations/Objective:  Telephone Virtual Visit   Assessment and Plan:  1. Cerebrovascular accident (CVA), unspecified mechanism (HCC) No signs or symptoms of recurrence reported.   2. Essential hypertension She will continue to decrease high sodium intake, excessive alcohol intake, increase potassium intake, smoking cessation, and increase physical activity of at least 30 minutes of cardio activity daily. She will continue to follow Heart Healthy or DASH diet.  3. Chronic gout of foot, unspecified cause, unspecified laterality She will continue Allopurinol and Indocin as prescribed.   4. Generalized pain  5. Follow up She will follow up in 3 months.     No orders of the defined types were placed in this encounter.   No orders of the defined types were placed in this encounter.   Referral Orders  No referral(s) requested today    Raliegh IpNatalie Rilynn Habel,  MSN, FNP-C [facility] [address]  [place],  [place] [address] [phone]    Follow Up Instructions:  She will follow up in 3 months.     I discussed the assessment and treatment plan with the patient. The patient was provided an opportunity to ask questions and all were answered. The patient agreed with the plan and demonstrated an understanding of the instructions.   The patient was advised to call back or seek an in-person evaluation if  the symptoms worsen or if the condition fails to improve as anticipated.  I provided 15 minutes of non-face-to-face time during this encounter.   Kallie Locks, FNP

## 2018-11-23 ENCOUNTER — Other Ambulatory Visit: Payer: Self-pay

## 2018-11-23 ENCOUNTER — Ambulatory Visit (INDEPENDENT_AMBULATORY_CARE_PROVIDER_SITE_OTHER): Payer: Self-pay | Admitting: *Deleted

## 2018-11-23 DIAGNOSIS — I639 Cerebral infarction, unspecified: Secondary | ICD-10-CM

## 2018-11-23 LAB — CUP PACEART REMOTE DEVICE CHECK
Date Time Interrogation Session: 20200516221220
Implantable Pulse Generator Implant Date: 20190212

## 2018-12-01 NOTE — Progress Notes (Signed)
Carelink Summary Report / Loop Recorder 

## 2018-12-14 ENCOUNTER — Other Ambulatory Visit: Payer: Self-pay | Admitting: Family Medicine

## 2018-12-14 DIAGNOSIS — I10 Essential (primary) hypertension: Secondary | ICD-10-CM

## 2018-12-14 MED FILL — HYDROCHLOROTHIAZIDE 25 MG T: 25 | 30 days supply | Qty: 30 | Fill #3

## 2018-12-16 MED FILL — AMLODIPINE BESYLATE 10 MG T: 10 | 30 days supply | Qty: 30 | Fill #0

## 2018-12-24 ENCOUNTER — Ambulatory Visit (INDEPENDENT_AMBULATORY_CARE_PROVIDER_SITE_OTHER): Payer: Self-pay | Admitting: *Deleted

## 2018-12-24 DIAGNOSIS — I63432 Cerebral infarction due to embolism of left posterior cerebral artery: Secondary | ICD-10-CM

## 2018-12-25 LAB — CUP PACEART REMOTE DEVICE CHECK
Date Time Interrogation Session: 20200618223926
Implantable Pulse Generator Implant Date: 20190212

## 2018-12-28 NOTE — Progress Notes (Signed)
Carelink Summary Report / Loop Recorder 

## 2019-01-12 ENCOUNTER — Other Ambulatory Visit: Payer: Self-pay | Admitting: Family Medicine

## 2019-01-12 DIAGNOSIS — I10 Essential (primary) hypertension: Secondary | ICD-10-CM

## 2019-01-13 ENCOUNTER — Other Ambulatory Visit: Payer: Self-pay

## 2019-01-13 MED FILL — HYDROCHLOROTHIAZIDE 25 MG T: 25 | 30 days supply | Qty: 30 | Fill #0

## 2019-01-13 MED FILL — AMLODIPINE BESYLATE 10 MG T: 10 | 30 days supply | Qty: 30 | Fill #0

## 2019-01-19 ENCOUNTER — Other Ambulatory Visit: Payer: Self-pay

## 2019-01-19 DIAGNOSIS — I10 Essential (primary) hypertension: Secondary | ICD-10-CM

## 2019-01-19 MED ORDER — HYDROCHLOROTHIAZIDE 25 MG PO TABS: 25.0000 mg | ORAL_TABLET | Freq: Every day | ORAL | 1 refills | Status: DC

## 2019-01-19 NOTE — Telephone Encounter (Signed)
Medication sent to pharmacy  

## 2019-01-26 ENCOUNTER — Ambulatory Visit (INDEPENDENT_AMBULATORY_CARE_PROVIDER_SITE_OTHER): Payer: Self-pay | Admitting: *Deleted

## 2019-01-26 DIAGNOSIS — I63432 Cerebral infarction due to embolism of left posterior cerebral artery: Secondary | ICD-10-CM

## 2019-01-27 LAB — CUP PACEART REMOTE DEVICE CHECK
Date Time Interrogation Session: 20200721234018
Implantable Pulse Generator Implant Date: 20190212

## 2019-02-08 MED FILL — ALBUTEROL SULFATE HFA 108 (: 108 (90 BAS | 16 days supply | Qty: 9 | Fill #0

## 2019-02-10 NOTE — Progress Notes (Signed)
Carelink Summary Report / Loop Recorder 

## 2019-02-15 ENCOUNTER — Ambulatory Visit (INDEPENDENT_AMBULATORY_CARE_PROVIDER_SITE_OTHER): Payer: Self-pay | Admitting: Family Medicine

## 2019-02-15 ENCOUNTER — Other Ambulatory Visit: Payer: Self-pay

## 2019-02-15 ENCOUNTER — Encounter: Payer: Self-pay | Admitting: Family Medicine

## 2019-02-15 VITALS — BP 130/86 | HR 80 | Temp 98.4°F | Ht 64.0 in | Wt 209.0 lb

## 2019-02-15 DIAGNOSIS — R8290 Unspecified abnormal findings in urine: Secondary | ICD-10-CM

## 2019-02-15 DIAGNOSIS — Z131 Encounter for screening for diabetes mellitus: Secondary | ICD-10-CM

## 2019-02-15 DIAGNOSIS — I10 Essential (primary) hypertension: Secondary | ICD-10-CM

## 2019-02-15 DIAGNOSIS — I639 Cerebral infarction, unspecified: Secondary | ICD-10-CM

## 2019-02-15 DIAGNOSIS — F17200 Nicotine dependence, unspecified, uncomplicated: Secondary | ICD-10-CM

## 2019-02-15 DIAGNOSIS — M1A0790 Idiopathic chronic gout, unspecified ankle and foot, without tophus (tophi): Secondary | ICD-10-CM

## 2019-02-15 DIAGNOSIS — Z09 Encounter for follow-up examination after completed treatment for conditions other than malignant neoplasm: Secondary | ICD-10-CM

## 2019-02-15 LAB — POCT URINALYSIS DIP (MANUAL ENTRY)
Bilirubin, UA: NEGATIVE
Glucose, UA: NEGATIVE mg/dL
Ketones, POC UA: NEGATIVE mg/dL
Nitrite, UA: NEGATIVE
Protein Ur, POC: NEGATIVE mg/dL
Spec Grav, UA: 1.025 (ref 1.010–1.025)
Urobilinogen, UA: 0.2 E.U./dL
pH, UA: 5.5 (ref 5.0–8.0)

## 2019-02-15 LAB — POCT GLYCOSYLATED HEMOGLOBIN (HGB A1C): Hemoglobin A1C: 5.4 % (ref 4.0–5.6)

## 2019-02-15 MED FILL — ?AMLODIPINE BESYLATE 10 MG: 10 | 30 days supply | Qty: 30 | Fill #1

## 2019-02-15 MED FILL — ?HYDROCHLOROTHIAZIDE 25MG T: 25 | 30 days supply | Qty: 30 | Fill #1

## 2019-02-15 NOTE — Progress Notes (Signed)
Patient [facility]    Established Patient Office Visit  Subjective:  Patient ID: Suzanne Graham, female    DOB: [birth year]  Age: 38 y.o. MRN: [id number]  CC:  Chief Complaint  Patient presents with   Follow-up    chronic condition     HPI Suzanne Graham is a 38 year old female who presents for follow up today.  Past Medical History:  Diagnosis Date   Asthma    Depression    Gout    Hypertension    Stroke [facility])    Current Status: Since her last office visit, she is doing well with no complaints. She denies visual changes, chest pain, cough, shortness of breath, heart palpitations, and falls. She has occasional headaches and dizziness with position changes. Denies severe headaches, confusion, seizures, double vision, and blurred vision, nausea and vomiting.  She denies fevers, chills, fatigue, recent infections, weight loss, and night sweats. She has not had any visual changes, and falls. No chest pain, heart palpitations, cough and shortness of breath reported. No reports of GI problems such as diarrhea, and constipation. She has no reports of blood in stools, dysuria and hematuria. No depression or anxiety reported. She denies pain today.   Past Surgical History:  Procedure Laterality Date   LOOP RECORDER INSERTION N/A 08/19/2017   Procedure: LOOP RECORDER INSERTION;  Surgeon: Thompson Grayer, MD;  Location: [place] [facility];  Service: Cardiovascular;  Laterality: N/A;   TEE WITHOUT CARDIOVERSION N/A 08/19/2017   Procedure: TRANSESOPHAGEAL ECHOCARDIOGRAM (TEE);  Surgeon: Fay Records, MD;  Location: [facility] ENDOSCOPY;  Service: Cardiovascular;  Laterality: N/A;    Family History  Problem Relation Age of Onset   Heart failure Mother    Hypertension Mother    Stroke Neg Hx     Social History   Socioeconomic History   Marital status: Single    Spouse name: Not on file   Number of children: Not on file   Years of  education: Not on file   Highest education level: Not on file  Occupational History   Not on file  Social Needs   Financial resource strain: Not on file   Food insecurity    Worry: Not on file    Inability: Not on file   Transportation needs    Medical: Not on file    Non-medical: Not on file  Tobacco Use   Smoking status: Current Every Day Smoker    Packs/day: 0.25    Types: Cigarettes   Smokeless tobacco: Never Used  Substance and Sexual Activity   Alcohol use: Yes    Comment: occasinally   Drug use: No   Sexual activity: Not on file  Lifestyle   Physical activity    Days per week: Not on file    Minutes per session: Not on file   Stress: Not on file  Relationships   Social connections    Talks on phone: Not on file    Gets together: Not on file    Attends religious service: Not on file    Active member of club or organization: Not on file    Attends meetings of clubs or organizations: Not on file    Relationship status: Not on file   Intimate partner violence    Fear of current or ex partner: Not on file    Emotionally abused: Not on file    Physically abused: Not on file    Forced sexual activity: Not  on file  Other Topics Concern   Not on file  Social History Narrative   Not on file    Outpatient Medications Prior to Visit  Medication Sig Dispense Refill   albuterol (PROVENTIL HFA;VENTOLIN HFA) 108 (90 Base) MCG/ACT inhaler Inhale 2 puffs into the lungs every 4 (four) hours as needed for wheezing or shortness of breath. 1 Inhaler 1   amLODipine (NORVASC) 10 MG tablet Take 1 tablet (10 mg total) by mouth daily. 30 tablet 2   atorvastatin (LIPITOR) 20 MG tablet Take 1 tablet (20 mg total) by mouth daily at 6 PM. 30 tablet 3   hydrochlorothiazide (HYDRODIURIL) 25 MG tablet Take 1 tablet (25 mg total) by mouth daily. 30 tablet 1   ibuprofen (ADVIL,MOTRIN) 200 MG tablet Take 400 mg by mouth every 6 (six) hours as needed for mild pain.      indomethacin (INDOCIN) 25 MG capsule Take 1 capsule (25 mg total) by mouth 3 (three) times daily as needed. 30 capsule 3   allopurinol (ZYLOPRIM) 100 MG tablet Take 1 tablet (100 mg total) by mouth daily. (Patient not taking: Reported on 11/13/2018) 30 tablet 3   No facility-administered medications prior to visit.     No Known Allergies  ROS Review of Systems  Constitutional: Negative.   HENT: Negative.   Eyes: Positive for visual disturbance (blurry vision).  Cardiovascular: Negative.   Gastrointestinal: Negative.   Endocrine: Negative.   Genitourinary: Negative.   Musculoskeletal: Negative.   Skin: Negative.   Allergic/Immunologic: Negative.   Neurological: Positive for dizziness (occassional) and headaches (occasional ).  Hematological: Negative.   Psychiatric/Behavioral: Negative.       Objective:    Physical Exam  Constitutional: She is oriented to person, place, and time. She appears well-developed and well-nourished.  HENT:  Head: Normocephalic and atraumatic.  Eyes: Conjunctivae are normal.  Neck: Normal range of motion. Neck supple.  Cardiovascular: Normal rate, regular rhythm, normal heart sounds and intact distal pulses.  Pulmonary/Chest: Effort normal and breath sounds normal.  Abdominal: Soft. Bowel sounds are normal. She exhibits distension (obese).  Musculoskeletal: Normal range of motion.  Neurological: She is alert and oriented to person, place, and time. She has normal reflexes.  Skin: Skin is warm and dry.  Psychiatric: She has a normal mood and affect. Her behavior is normal. Judgment and thought content normal.  Nursing note and vitals reviewed.   BP 130/86 (BP Location: Left Arm, Patient Position: Sitting, Cuff Size: Large)    Pulse 80    Temp 98.4 F (36.9 C) (Oral)    Ht 5\' 4"  (1.626 m)    Wt 209 lb (94.8 kg)    LMP 02/11/2019    SpO2 99%    BMI 35.87 kg/m  Wt Readings from Last 3 Encounters:  02/15/19 209 lb (94.8 kg)  09/01/18 176 lb 5.9 oz  (80 kg)  07/10/18 219 lb (99.3 kg)     Health Maintenance Due  Topic Date Due   PAP SMEAR-Modifier  08/09/1999   INFLUENZA VACCINE  02/06/2019    There are no preventive care reminders to display for this patient.  No results found for: TSH Lab Results  Component Value Date   WBC 8.6 06/10/2018   HGB 10.4 (L) 06/10/2018   HCT 34.5 (L) 06/10/2018   MCV 78.8 (L) 06/10/2018   PLT 271 06/10/2018   Lab Results  Component Value Date   NA 138 06/10/2018   K 4.2 06/10/2018   CO2 24 06/10/2018  GLUCOSE 90 06/10/2018   BUN 15 06/10/2018   CREATININE 1.46 (H) 06/10/2018   BILITOT 0.4 09/09/2017   ALKPHOS 54 09/09/2017   AST 21 09/09/2017   ALT 17 09/09/2017   PROT 7.6 09/09/2017   ALBUMIN 4.1 09/09/2017   CALCIUM 9.0 06/10/2018   ANIONGAP 9 06/10/2018   Lab Results  Component Value Date   CHOL 162 07/18/2017   Lab Results  Component Value Date   HDL 48 07/18/2017   Lab Results  Component Value Date   LDLCALC 98 07/18/2017   Lab Results  Component Value Date   TRIG 80 07/18/2017   Lab Results  Component Value Date   CHOLHDL 3.4 07/18/2017   Lab Results  Component Value Date   HGBA1C 5.4 02/15/2019   Assessment & Plan:   1. Screening for diabetes mellitus Hgb A1c is stable at 5.4 today. She will continue to decrease foods/beverages high in sugars and carbs and follow Heart Healthy or DASH diet. Increase physical activity to at least 30 minutes cardio exercise daily.  - POCT urinalysis dipstick - POCT glycosylated hemoglobin (Hb A1C)  2. Cerebrovascular accident (CVA), unspecified mechanism (HCC) Stable. No signs or symptoms of recurrence reported or noted.   3. Essential hypertension The current medical regimen is effective; blood pressure  is stable at 130/86 today; continue present plan and medications as prescribed. She will continue to decrease high sodium intake, excessive alcohol intake, increase potassium intake, smoking cessation, and increase  physical activity of at least 30 minutes of cardio activity daily. She will continue to follow Heart Healthy or DASH diet.  4. Chronic gout of foot, unspecified cause, unspecified laterality Stable. She will continue Allopurinol as prescribed.   5. Current smoker  6. Abnormal urinalysis Small bacteria noted. She will increase water intake. she will also add probiotic to diet daily. Urine culture results are pending.  - Urine Culture  7. Follow up She will follow up in 6 months.   No orders of the defined types were placed in this encounter.   Orders Placed This Encounter  Procedures   Urine Culture   POCT urinalysis dipstick   POCT glycosylated hemoglobin (Hb A1C)    Referral Orders  No referral(s) requested today    Raliegh IpNatalie Renesmay Nesbitt,  MSN, FNP-BC [facility] [address]  [place], [place] [id number] [phone]    Problem List Items Addressed This Visit      Cardiovascular and Mediastinum   Essential hypertension   Stroke [facility](HCC)     Musculoskeletal and Integument   Chronic gout of foot     Other   Current smoker    Other Visit Diagnoses    Screening for diabetes mellitus    -  Primary   Relevant Orders   POCT urinalysis dipstick (Completed)   POCT glycosylated hemoglobin (Hb A1C) (Completed)   Abnormal urinalysis       Relevant Orders   Urine Culture   Follow up          No orders of the defined types were placed in this encounter.   Follow-up: Return in about 6 months (around 08/18/2019).    Kallie LocksNatalie M Rainn Bullinger, FNP

## 2019-02-17 LAB — URINE CULTURE

## 2019-03-01 ENCOUNTER — Ambulatory Visit (INDEPENDENT_AMBULATORY_CARE_PROVIDER_SITE_OTHER): Payer: Self-pay | Admitting: *Deleted

## 2019-03-01 DIAGNOSIS — I63432 Cerebral infarction due to embolism of left posterior cerebral artery: Secondary | ICD-10-CM

## 2019-03-02 LAB — CUP PACEART REMOTE DEVICE CHECK
Date Time Interrogation Session: 20200823233928
Implantable Pulse Generator Implant Date: 20190212

## 2019-03-04 ENCOUNTER — Other Ambulatory Visit: Payer: Self-pay

## 2019-03-04 ENCOUNTER — Emergency Department (HOSPITAL_COMMUNITY)
Admission: EM | Admit: 2019-03-04 | Discharge: 2019-03-04 | Disposition: A | Discharge status: Home / Self Care | Payer: Self-pay | Attending: Emergency Medicine | Admitting: Emergency Medicine

## 2019-03-04 ENCOUNTER — Encounter (HOSPITAL_COMMUNITY): Payer: Self-pay | Admitting: Emergency Medicine

## 2019-03-04 DIAGNOSIS — I10 Essential (primary) hypertension: Secondary | ICD-10-CM | POA: Insufficient documentation

## 2019-03-04 DIAGNOSIS — Z8709 Personal history of other diseases of the respiratory system: Secondary | ICD-10-CM | POA: Insufficient documentation

## 2019-03-04 DIAGNOSIS — Z79899 Other long term (current) drug therapy: Secondary | ICD-10-CM | POA: Insufficient documentation

## 2019-03-04 DIAGNOSIS — R030 Elevated blood-pressure reading, without diagnosis of hypertension: Secondary | ICD-10-CM

## 2019-03-04 DIAGNOSIS — M546 Pain in thoracic spine: Secondary | ICD-10-CM | POA: Insufficient documentation

## 2019-03-04 DIAGNOSIS — Z8673 Personal history of transient ischemic attack (TIA), and cerebral infarction without residual deficits: Secondary | ICD-10-CM | POA: Insufficient documentation

## 2019-03-04 DIAGNOSIS — F17210 Nicotine dependence, cigarettes, uncomplicated: Secondary | ICD-10-CM | POA: Insufficient documentation

## 2019-03-04 MED ORDER — CYCLOBENZAPRINE HCL 10 MG PO TABS: 5.0000 mg | ORAL_TABLET | Freq: Two times a day (BID) | ORAL | 0 refills | Status: DC | PRN

## 2019-03-04 MED ORDER — MELOXICAM 15 MG PO TABS: 15.0000 mg | ORAL_TABLET | Freq: Every day | ORAL | 0 refills | Status: DC

## 2019-03-04 MED FILL — CYCLOBENZAPRINE 10 MG TAB: 10 | 10 days supply | Qty: 20 | Fill #0

## 2019-03-04 MED FILL — MELOXICAM 15 MG TABLET: 15 | 10 days supply | Qty: 10 | Fill #0

## 2019-03-04 NOTE — ED Provider Notes (Signed)
[facility] EMERGENCY DEPARTMENT Provider Note   CSN: [id number] Arrival date & time: 03/04/19  1207     History   Chief Complaint Chief Complaint  Patient presents with  . Back Pain    HPI Suzanne Graham is a 38 y.o. female who presents emergency department chief complaint of upper back pain.  Patient states that 2 days ago she developed pain near her spine and between her shoulder blade on the left.  She states that it is worse when she performs certain movements sometimes catches but she denies any chest pain or shortness of breath.  Patient states that she has had this happen before but is never been this bad.  She took 600 mg of ibuprofen without significant relief.  She denies any injuries.     HPI  Past Medical History:  Diagnosis Date  . Asthma   . Depression   . Gout   . Hypertension   . Stroke [facility])     Patient Active Problem List   Diagnosis Date Noted  . 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 Current smoker 10/11/2018  . Generalized pain 10/11/2018  . Chronic nonintractable headache 08/06/2017  . Essential hypertension 08/06/2017  . Stroke [facility]) 07/17/2017    Past Surgical History:  Procedure Laterality Date  . LOOP RECORDER INSERTION N/A 08/19/2017   Procedure: LOOP RECORDER INSERTION;  Surgeon: Thompson Grayer, MD;  Location: [place] CV LAB;  Service: Cardiovascular;  Laterality: N/A;  . TEE WITHOUT CARDIOVERSION N/A 08/19/2017   Procedure: TRANSESOPHAGEAL ECHOCARDIOGRAM (TEE);  Surgeon: Fay Records, MD;  Location: [facility] ENDOSCOPY;  Service: Cardiovascular;  Laterality: N/A;     OB History   No obstetric history on file.      Home Medications    Prior to Admission medications   Medication Sig Start Date End Date Taking? Authorizing Provider  albuterol (PROVENTIL HFA;VENTOLIN HFA) 108 (90 Base) MCG/ACT inhaler Inhale 2 puffs into the lungs every 4 (four) hours as needed for wheezing or shortness of breath. 06/10/18   Langston Masker  B, PA-C  allopurinol (ZYLOPRIM) 100 MG tablet Take 1 tablet (100 mg total) by mouth daily. Patient not taking: Reported on 11/13/2018 08/31/18   Hedges, Dellis Filbert, PA-C  amLODipine (NORVASC) 10 MG tablet Take 1 tablet (10 mg total) by mouth daily. 09/09/18   [name], FNP  atorvastatin (LIPITOR) 20 MG tablet Take 1 tablet (20 mg total) by mouth daily at 6 PM. 10/09/18   [name], FNP  hydrochlorothiazide (HYDRODIURIL) 25 MG tablet Take 1 tablet (25 mg total) by mouth daily. 01/19/19 05/19/19  [name], FNP  ibuprofen (ADVIL,MOTRIN) 200 MG tablet Take 400 mg by mouth every 6 (six) hours as needed for mild pain.    [provider]  indomethacin (INDOCIN) 25 MG capsule Take 1 capsule (25 mg total) by mouth 3 (three) times daily as needed. 10/09/18   [name], FNP    Family History Family History  Problem Relation Age of Onset  . Heart failure Mother   . Hypertension Mother   . Stroke Neg Hx     Social History Social History   Tobacco Use  . Smoking status: Current Every Day Smoker    Packs/day: 0.25    Types: Cigarettes  . Smokeless tobacco: Never Used  Substance Use Topics  . Alcohol use: Yes    Comment: occasinally  . Drug use: No     Allergies   Patient has no  known allergies.   Review of Systems Review of Systems  Ten systems reviewed and are negative for acute change, except as noted in the HPI.   Physical Exam Updated Vital Signs BP (!) 146/99 (BP Location: Right Arm)   Pulse 92   Temp 99 F (37.2 C) (Oral)   Resp 16   Ht 5\' 4"  (1.626 m)   Wt 91.6 kg   LMP 03/01/2019   SpO2 99%   BMI 34.67 kg/m   Physical Exam Vitals signs and nursing note reviewed.  Constitutional:      General: She is not in acute distress.    Appearance: She is well-developed. She is not diaphoretic.  HENT:     Head: Normocephalic and atraumatic.  Eyes:     General: No scleral icterus.    Conjunctiva/sclera: Conjunctivae normal.  Neck:      Musculoskeletal: Normal range of motion.  Cardiovascular:     Rate and Rhythm: Normal rate and regular rhythm.     Heart sounds: Normal heart sounds. No murmur. No friction rub. No gallop.   Pulmonary:     Effort: Pulmonary effort is normal. No respiratory distress.     Breath sounds: Normal breath sounds.  Abdominal:     General: Bowel sounds are normal. There is no distension.     Palpations: Abdomen is soft. There is no mass.     Tenderness: There is no abdominal tenderness. There is no guarding.  Musculoskeletal:     Thoracic back: She exhibits pain and spasm. She exhibits no bony tenderness and no swelling.       Back:     Comments: Patient with full range of motion of the back.  Pain is worse with left lateral twisting, movement of the scapula, some relief with deep forward for stretching of the thoracic region.  Tender to palpation at the inferior insertion of the trapezius.  Skin:    General: Skin is warm and dry.  Neurological:     Mental Status: She is alert and oriented to person, place, and time.  Psychiatric:        Behavior: Behavior normal.      ED Treatments / Results  Labs (all labs ordered are listed, but only abnormal results are displayed) Labs Reviewed - No data to display  EKG None  Radiology No results found.  Procedures Procedures (including critical care time)  Medications Ordered in ED Medications - No data to display   Initial Impression / Assessment and Plan / ED Course  I have reviewed the triage vital signs and the nursing notes.  Pertinent labs & imaging results that were available during my care of the patient were reviewed by me and considered in my medical decision making (see chart for details).       Patient with musculoskeletal back pain.  She has poor posture, large breasts with rounded forward posture causing overstretching and lengthening of the antagonists mid thoracic region.  She also has shortening of the agonist  pectoralis.  Discussed need for sturdy bra support improvement in posture.  We will treat symptomatically with anti-inflammatories and muscle relaxer.  Recommend using a lidocaine patch, outpatient PCP follow-up.  Discussed return precautions.  Final Clinical Impressions(s) / ED Diagnoses   Final diagnoses:  None    ED Discharge Orders    None       Arthor CaptainHarris, Charliegh Vasudevan, PA-C 03/04/19 1324    Cathren LaineSteinl, Kevin, MD 03/04/19 [phone]

## 2019-03-04 NOTE — Discharge Instructions (Signed)
Get help right away if you: Have shortness of breath. Have chest pain. Develop numbness or weakness in your legs or arms. Have involuntary loss of urine (urinary incontinence). 

## 2019-03-04 NOTE — ED Triage Notes (Signed)
Pt states she has had mid upper back pain for 2 days. It worsens with movement.

## 2019-03-05 MED FILL — ALBUTEROL SULFATE HFA 108 (: 108 (90 BAS | 16 days supply | Qty: 9 | Fill #0

## 2019-03-08 NOTE — Progress Notes (Signed)
Carelink Summary Report / Loop Recorder 

## 2019-03-15 IMAGING — CT CT HEAD W/O CM
3 series · 16 of 46 positions shown, 19 images · non-contrast
Comparison: None.

CLINICAL DATA: Right eye blurred vision.

EXAM:
CT HEAD WITHOUT CONTRAST
TECHNIQUE: Contiguous axial images were obtained from the base of the skull
through the vertex without intravenous contrast.

[Series 2: head wo · axial · 0.47mm/px · z∈[-91,+29]mm · 10 of 29 slices shown, 13 images]
[im 3/29  brain]
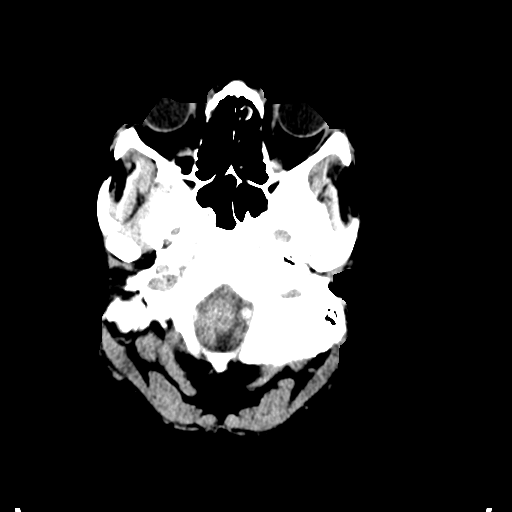
[im 3/29  bone]
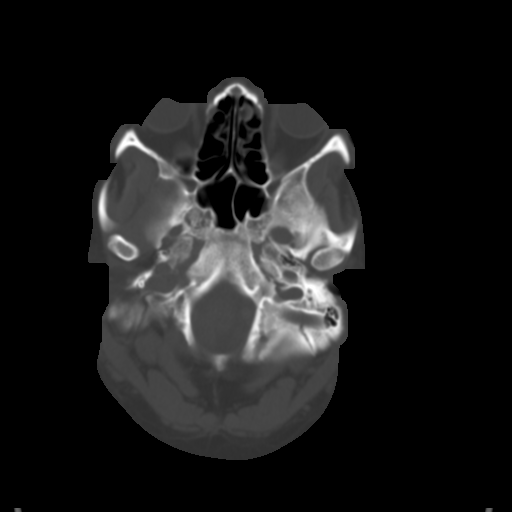
[im 6/29  brain]
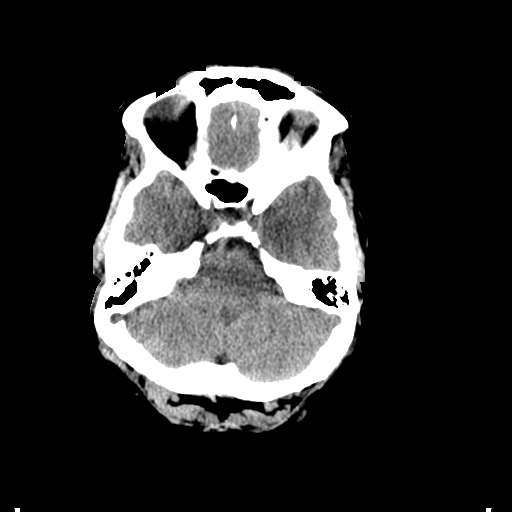
[im 8/29  brain]
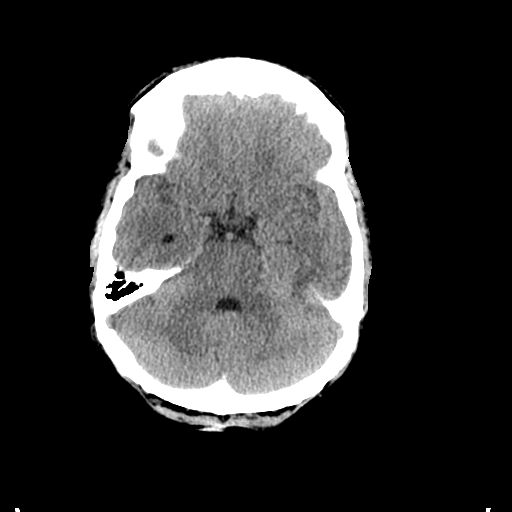
[im 11/29  brain]
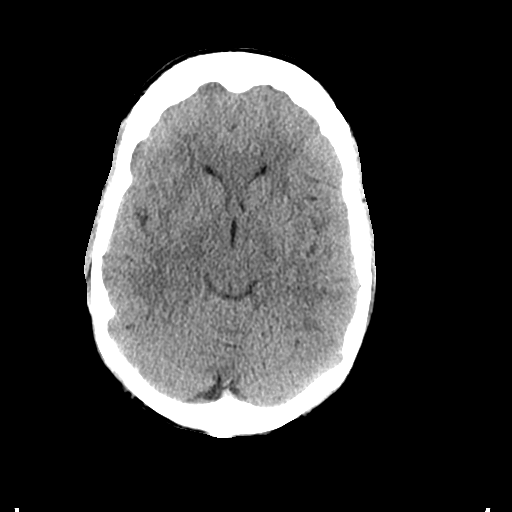
[im 14/29  brain]
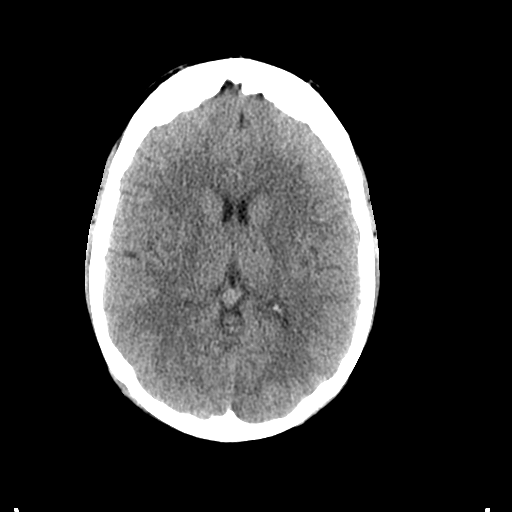
[im 14/29  bone]
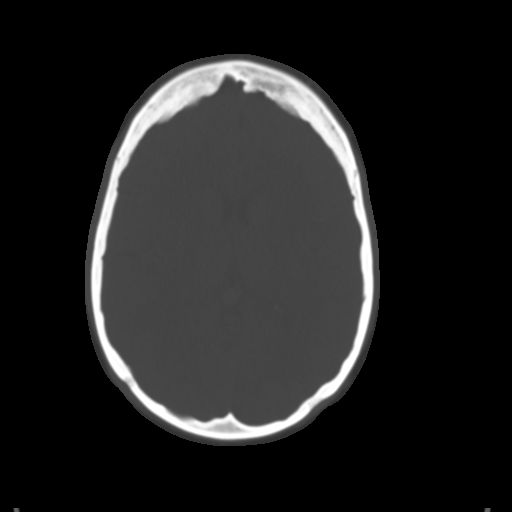
[im 16/29  brain]
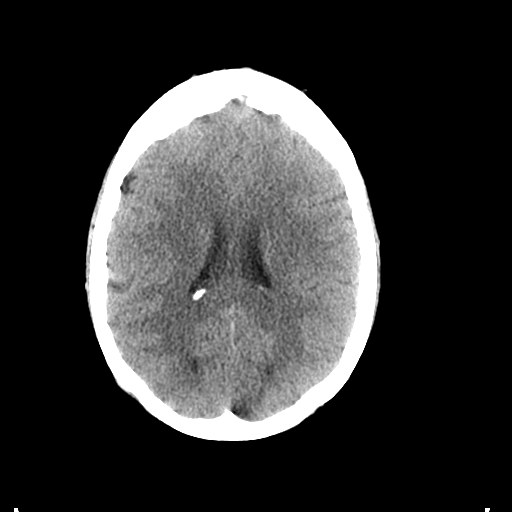
[im 19/29  brain]
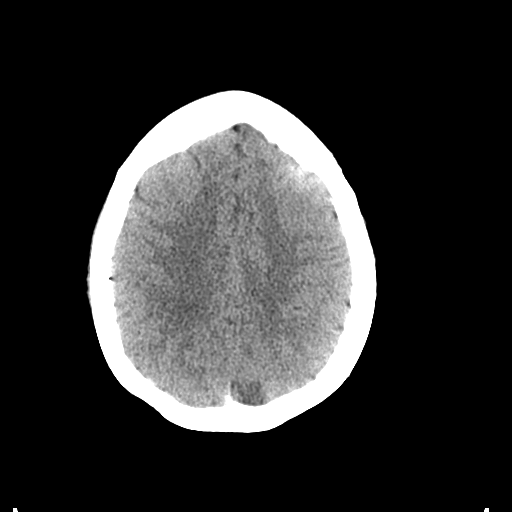
[im 22/29  brain]
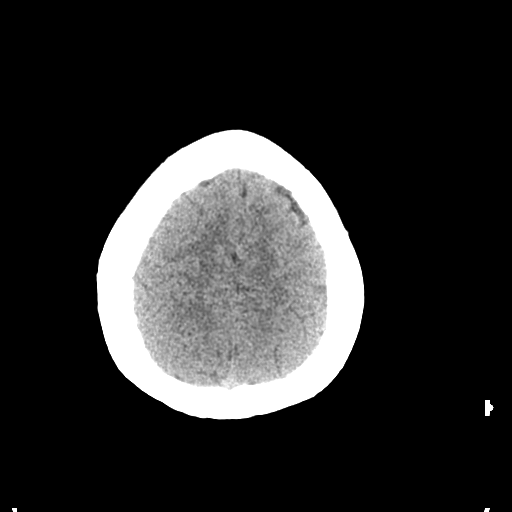
[im 24/29  brain]
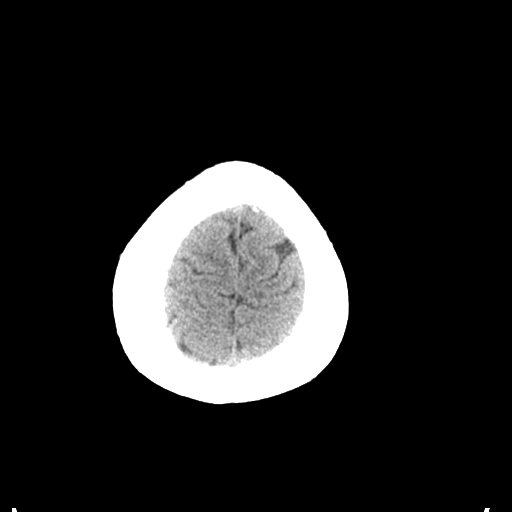
[im 24/29  bone]
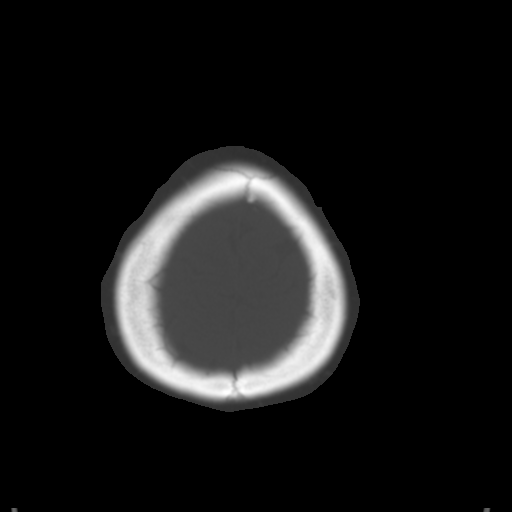
[im 27/29  brain]
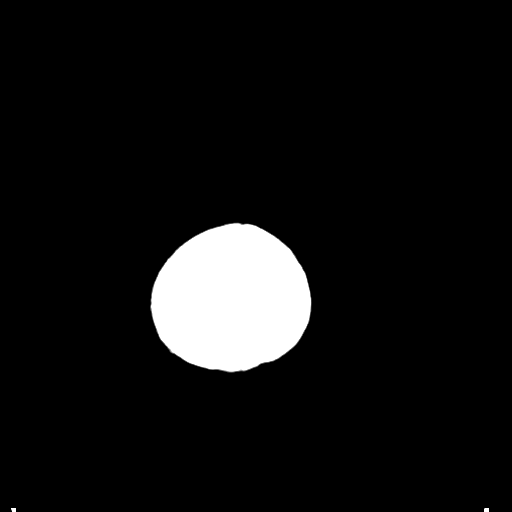

[Series 5: coronal soft tissue · coronal · 0.31mm/px · 3 of 64 slices shown]
[im 22/64  brain]
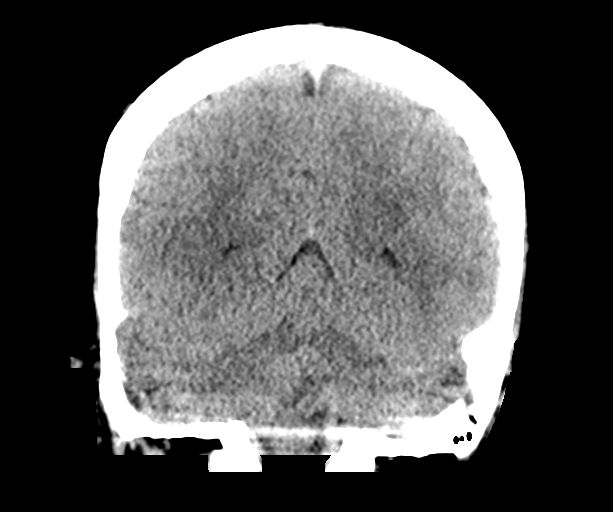
[im 29/64  brain]
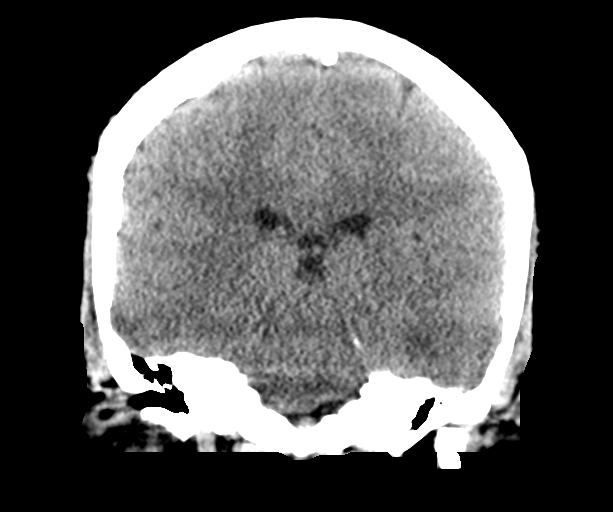
[im 36/64  brain]
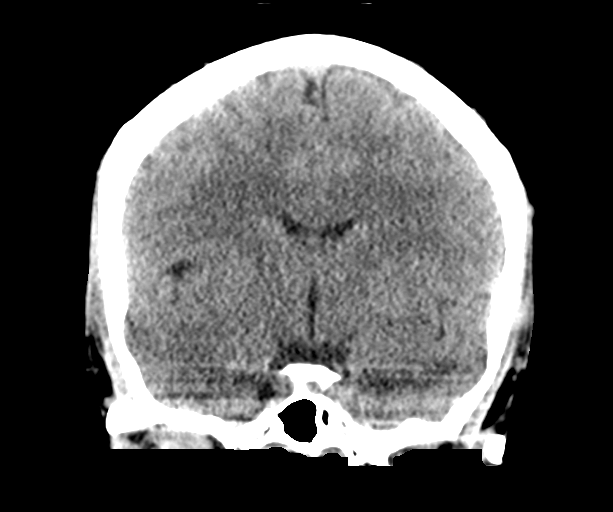

[Series 6: sagittal soft tissue · sagittal · 0.31mm/px · 3 of 63 slices shown]
[im 21/63  brain]
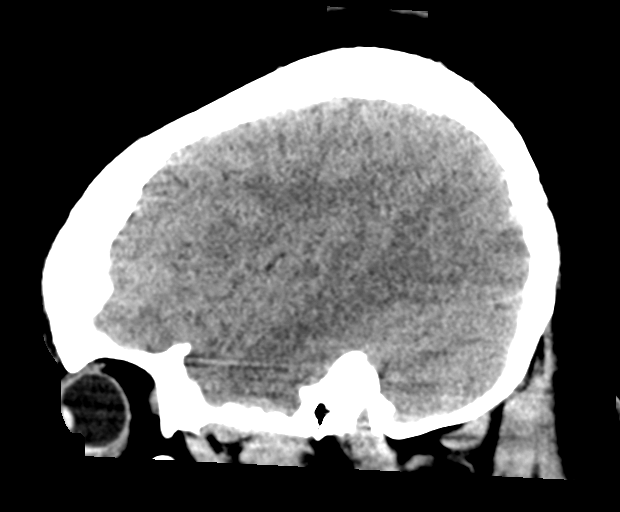
[im 32/63  brain]
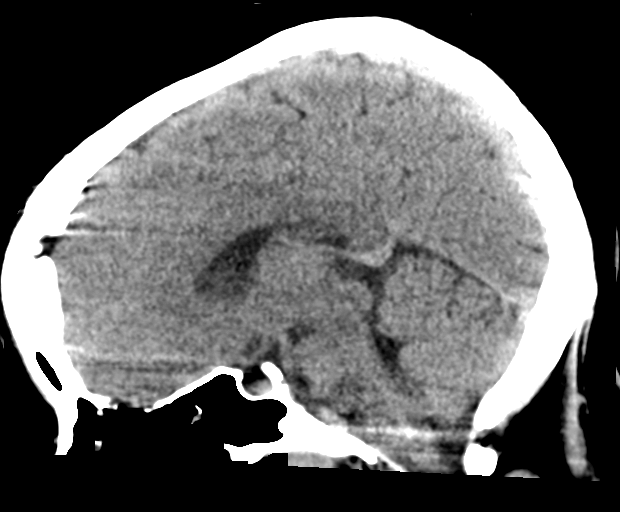
[im 42/63  brain]
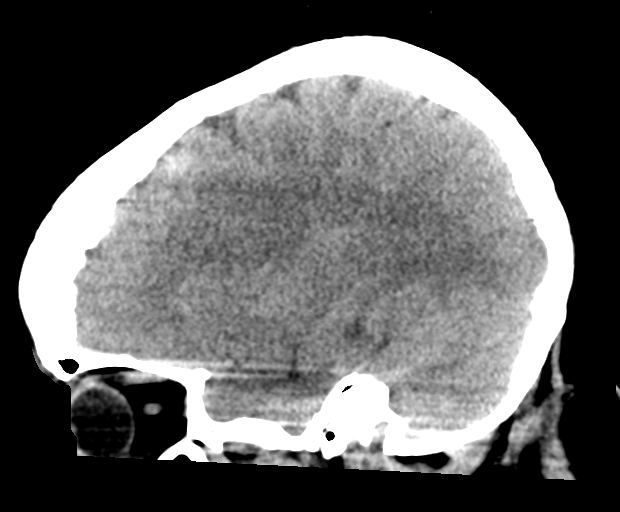

[16 of 46 positions shown; findings below may reference images not displayed]

FINDINGS: Brain: Low-density noted in the left occipital lobe compatible with
infarction. No hemorrhage or hydrocephalus. No mass lesion.

Vascular: No hyperdense vessel or unexpected calcification.

Skull: No acute calvarial abnormality.

Sinuses/Orbits: No acute finding.

Other: None
IMPRESSION: Low-density in the left occipital lobe compatible with acute to
subacute infarct.

## 2019-03-15 IMAGING — MR MR HEAD W/O CM
9 of 11 series · 35 of 48 positions shown · non-contrast
Comparison: 07/17/2016 CT head.  CT angiogram of head.

CLINICAL DATA: 36 y/o F; right-sided eye blurriness and visual
field changes.

EXAM:
MRI HEAD WITHOUT CONTRAST
TECHNIQUE: Multiplanar, multiecho pulse sequences of the brain and surrounding
structures were obtained without intravenous contrast.

[Series 3: DWI · axial · 3.0mm · 1.09mm/px · z∈[-23,+116]mm · 8 of 96 slices shown (1 of 4)]
[im 1/96]
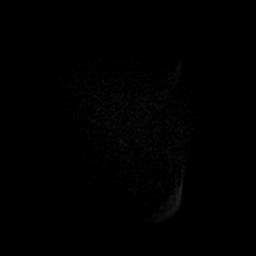
[im 11/96]
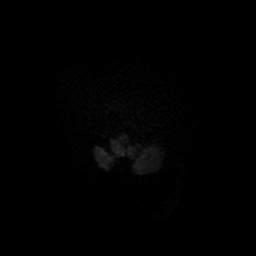
[im 32/96]
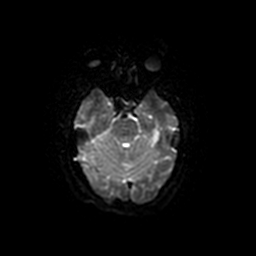
[im 43/96]
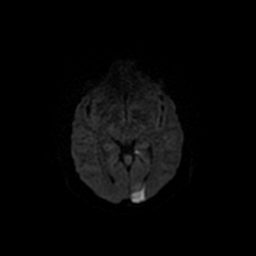
[im 53/96]
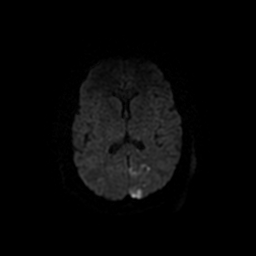
[im 64/96]
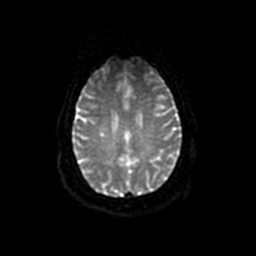
[im 85/96]
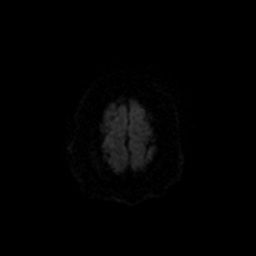
[im 96/96]
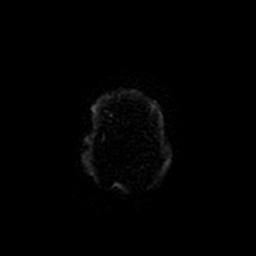

[Series 4: T1 · sagittal · 5.0mm · 0.47mm/px · 2 of 24 slices shown]
[im 1/24]
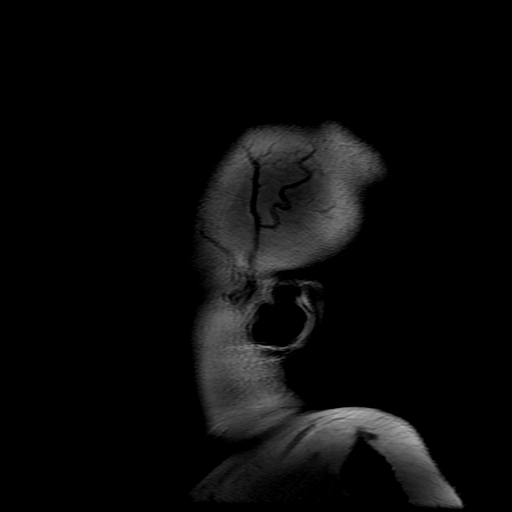
[im 24/24]
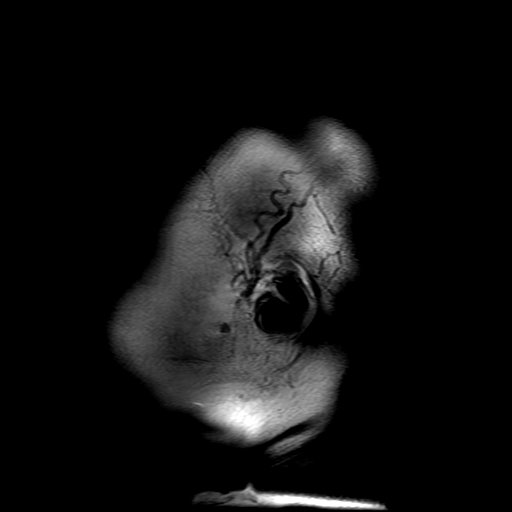

[Series 5: DWI · coronal · 5.0mm · 1.09mm/px · 7 of 68 slices shown (2 of 4)]
[im 1/68]
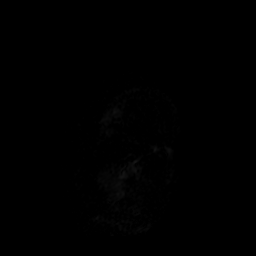
[im 12/68]
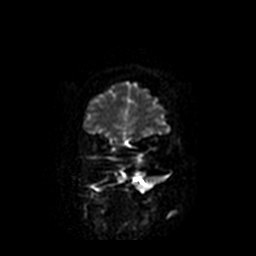
[im 23/68]
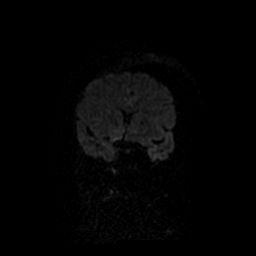
[im 34/68]
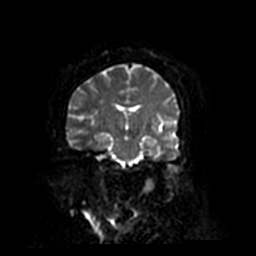
[im 45/68]
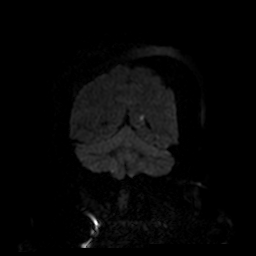
[im 56/68]
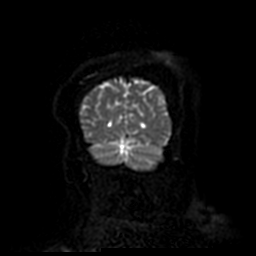
[im 68/68]
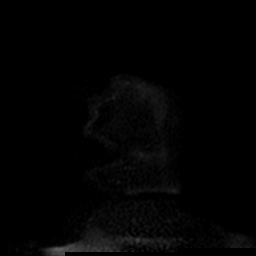

[Series 6: T2 · axial · 5.0mm · 0.43mm/px · z∈[-35,+111]mm · 3 of 26 slices shown (1 of 2)]
[im 1/26]
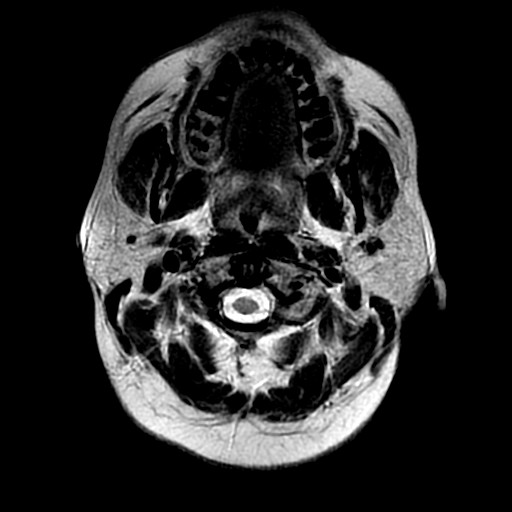
[im 13/26]
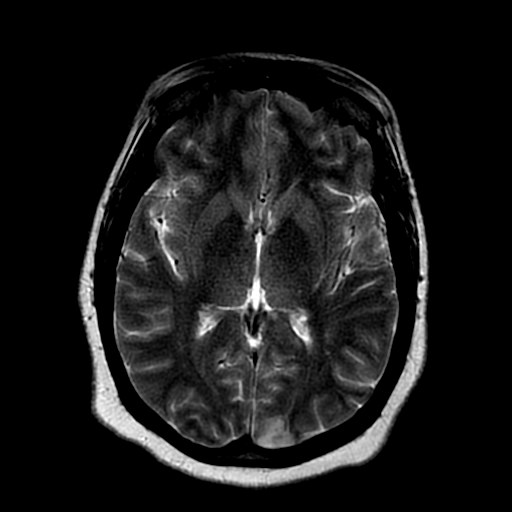
[im 26/26]
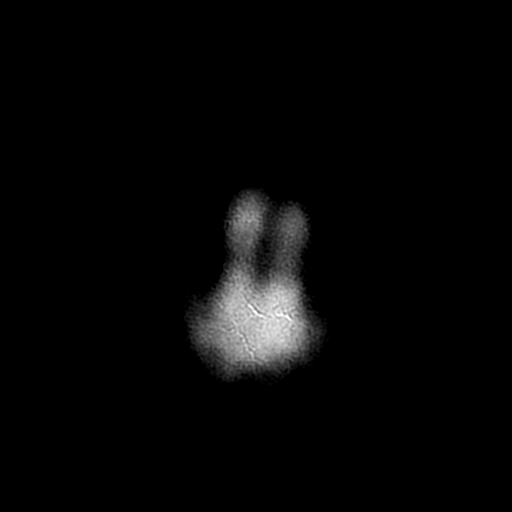

[Series 7: FLAIR · axial · 5.0mm · 0.43mm/px · z∈[-37,+108]mm · 3 of 26 slices shown]
[im 1/26]
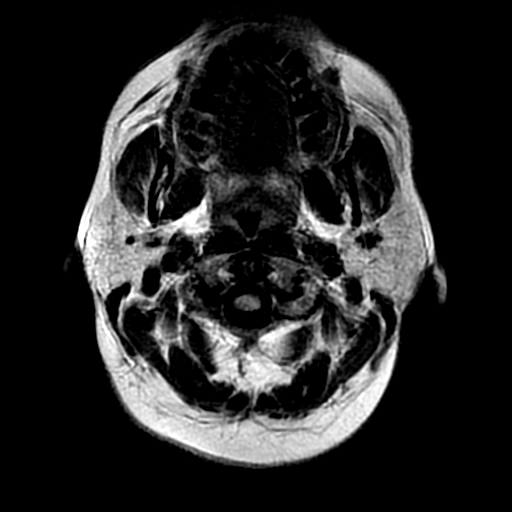
[im 13/26]
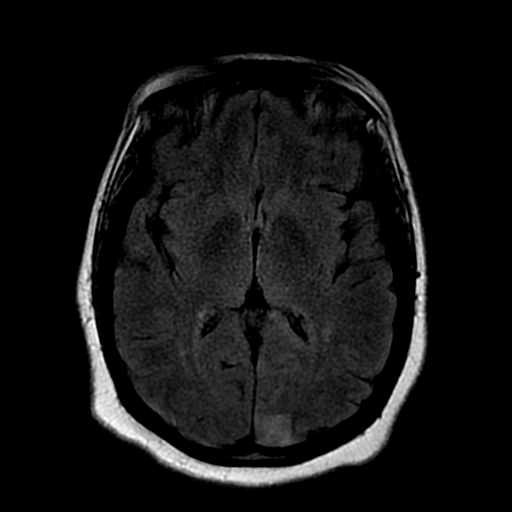
[im 26/26]
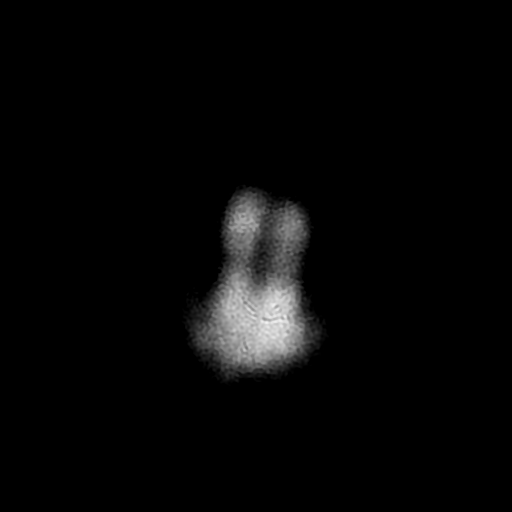

[Series 8: ax mpgr · axial · 5.0mm · 0.43mm/px · 1 of 23 slices shown]
[im 1/23]
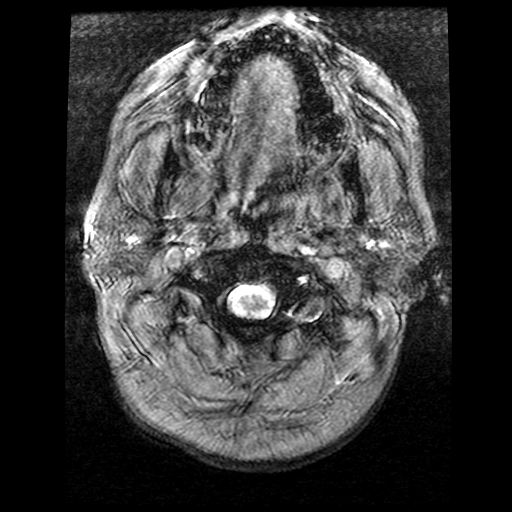

[Series 10: T2 · coronal · 5.0mm · 0.45mm/px · 3 of 26 slices shown (2 of 2)]
[im 1/26]
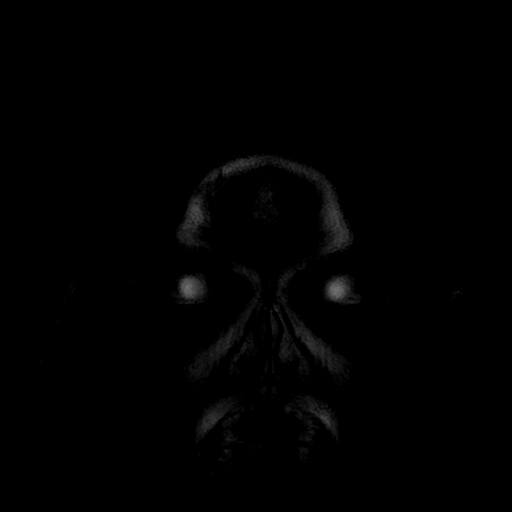
[im 13/26]
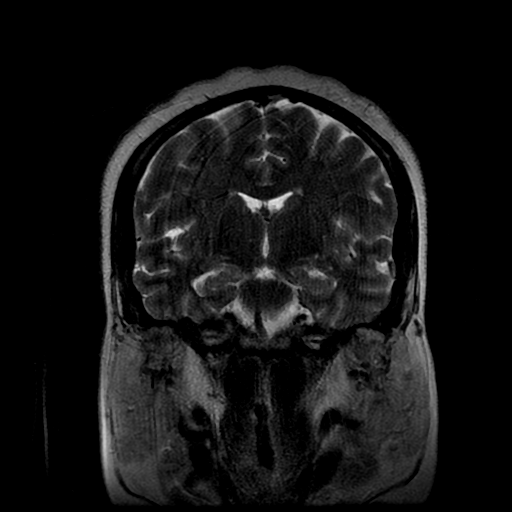
[im 26/26]
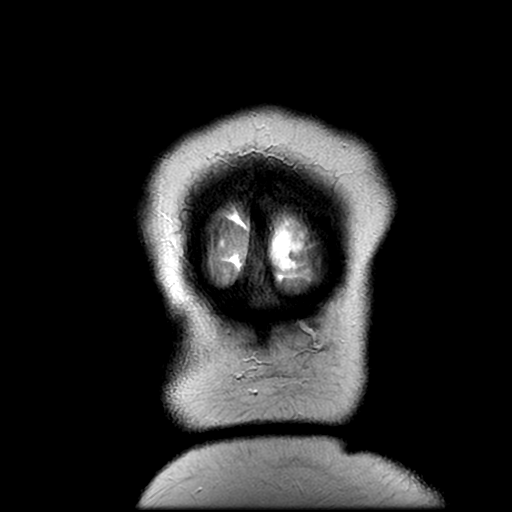

[Series 300: DWI · axial · 3.0mm · 1.09mm/px · z∈[-23,+116]mm · 5 of 48 slices shown (3 of 4)]
[im 1/48]
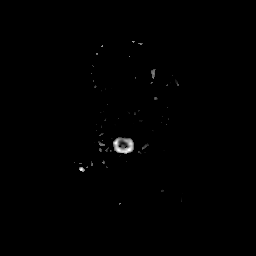
[im 12/48]
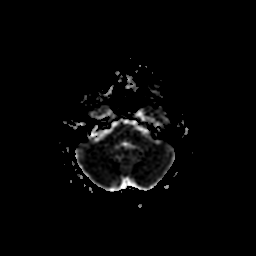
[im 24/48]
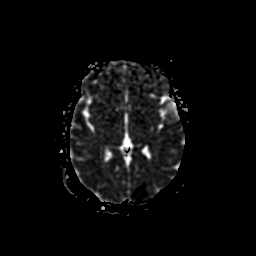
[im 36/48]
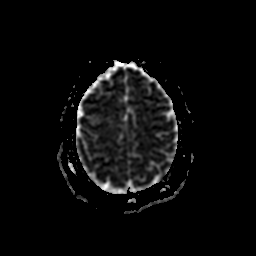
[im 48/48]
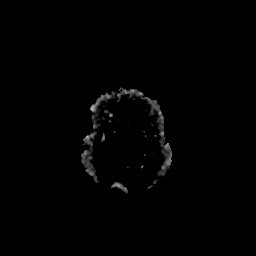

[Series 500: DWI · coronal · 5.0mm · 1.09mm/px · 3 of 34 slices shown (4 of 4)]
[im 1/34]
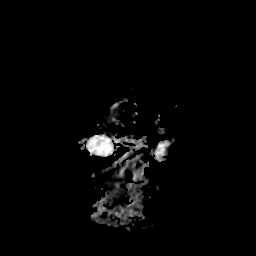
[im 17/34]
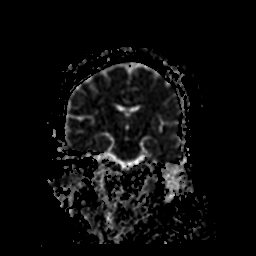
[im 34/34]
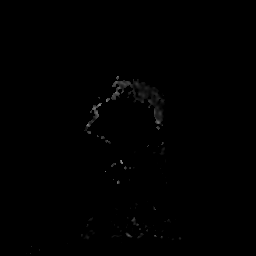

[35 of 48 positions shown; findings below may reference images not displayed]

FINDINGS: Brain: Multiple foci of reduced diffusion are present throughout the
left PCA distribution involving left medial temporal lobe, occipital
lobe, left splenium of corpus callosum, and left thalamus compatible
with acute/early subacute infarction. Areas of infarction
demonstrate T2 FLAIR hyperintense signal abnormality. No significant
mass effect or hemorrhage.

Fewnonspecific foci of T2 FLAIR hyperintense signal abnormality in
subcortical and periventricular white matter are compatible
withmildchronic microvascular ischemic changes for age. No
extra-axial collection, hydrocephalus, or effacement of basilar
cisterns. No herniation.

Vascular: Refer to same-day CT angiogram of head.

Skull and upper cervical spine: Normal marrow signal.

Sinuses/Orbits: Moderate maxillary and anterior ethmoid sinus
mucosal thickening. No abnormal signal of mastoid air cells. Orbits
are unremarkable.

Other: None.
IMPRESSION: Several areas of acute/early subacute infarction within the left
posterior cerebral artery distribution. No hemorrhage or mass
effect.

By: Ayaaxil Nassath M.D.

## 2019-03-16 MED FILL — ?HYDROCHLOROTHIAZIDE 25MG T: 25 | 30 days supply | Qty: 30 | Fill #2

## 2019-03-16 MED FILL — ?AMLODIPINE BESYLATE 10 MG: 10 | 30 days supply | Qty: 30 | Fill #2

## 2019-04-02 ENCOUNTER — Ambulatory Visit (INDEPENDENT_AMBULATORY_CARE_PROVIDER_SITE_OTHER): Payer: Self-pay | Admitting: *Deleted

## 2019-04-02 DIAGNOSIS — I63432 Cerebral infarction due to embolism of left posterior cerebral artery: Secondary | ICD-10-CM

## 2019-04-06 LAB — CUP PACEART REMOTE DEVICE CHECK
Date Time Interrogation Session: 20200929012457
Implantable Pulse Generator Implant Date: 20190212

## 2019-04-06 NOTE — Progress Notes (Signed)
Carelink Summary Report / Loop Recorder 

## 2019-04-19 MED FILL — ?AMLODIPINE BESYLATE 10 MG: 10 | 30 days supply | Qty: 30 | Fill #3

## 2019-04-19 MED FILL — ?HYDROCHLOROTHIAZIDE 25MG T: 25 | 30 days supply | Qty: 30 | Fill #0

## 2019-05-09 LAB — CUP PACEART REMOTE DEVICE CHECK
Date Time Interrogation Session: 20201101012237
Implantable Pulse Generator Implant Date: 20190212

## 2019-05-10 ENCOUNTER — Ambulatory Visit (INDEPENDENT_AMBULATORY_CARE_PROVIDER_SITE_OTHER): Payer: Self-pay | Admitting: *Deleted

## 2019-05-10 DIAGNOSIS — I63432 Cerebral infarction due to embolism of left posterior cerebral artery: Secondary | ICD-10-CM

## 2019-05-18 MED FILL — AMLODIPINE BESYLATE 10 MG T: 10 | 30 days supply | Qty: 30 | Fill #4

## 2019-05-18 MED FILL — ?HYDROCHLOROTHIAZIDE 25MG T: 25 | 30 days supply | Qty: 30 | Fill #1

## 2019-05-26 ENCOUNTER — Other Ambulatory Visit: Payer: Self-pay | Admitting: Family Medicine

## 2019-05-26 DIAGNOSIS — F17200 Nicotine dependence, unspecified, uncomplicated: Secondary | ICD-10-CM

## 2019-05-26 MED FILL — ALBUTEROL SULFATE HFA 108 (: 108 (90 BAS | 25 days supply | Qty: 18 | Fill #0

## 2019-06-01 NOTE — Progress Notes (Signed)
Carelink Summary Report / Loop Recorder 

## 2019-06-11 ENCOUNTER — Ambulatory Visit (INDEPENDENT_AMBULATORY_CARE_PROVIDER_SITE_OTHER): Payer: Self-pay | Admitting: *Deleted

## 2019-06-11 DIAGNOSIS — I63432 Cerebral infarction due to embolism of left posterior cerebral artery: Secondary | ICD-10-CM

## 2019-06-12 LAB — CUP PACEART REMOTE DEVICE CHECK
Date Time Interrogation Session: 20201203202314
Implantable Pulse Generator Implant Date: 20190212

## 2019-06-21 ENCOUNTER — Other Ambulatory Visit: Payer: Self-pay | Admitting: Family Medicine

## 2019-06-21 DIAGNOSIS — I10 Essential (primary) hypertension: Secondary | ICD-10-CM

## 2019-06-21 MED FILL — ?HYDROCHLOROTHIAZIDE 25MG T: 25 | 30 days supply | Qty: 30 | Fill #0

## 2019-06-21 MED FILL — ?AMLODIPINE BESYLATE 10 MG: 10 | 30 days supply | Qty: 30 | Fill #5

## 2019-06-29 ENCOUNTER — Other Ambulatory Visit: Payer: Self-pay

## 2019-06-29 ENCOUNTER — Emergency Department (HOSPITAL_COMMUNITY)
Admission: EM | Admit: 2019-06-29 | Discharge: 2019-06-29 | Disposition: A | Discharge status: Home / Self Care | Payer: Self-pay | Attending: Emergency Medicine | Admitting: Emergency Medicine

## 2019-06-29 DIAGNOSIS — M1A0790 Idiopathic chronic gout, unspecified ankle and foot, without tophus (tophi): Secondary | ICD-10-CM

## 2019-06-29 DIAGNOSIS — Z79899 Other long term (current) drug therapy: Secondary | ICD-10-CM | POA: Insufficient documentation

## 2019-06-29 DIAGNOSIS — I10 Essential (primary) hypertension: Secondary | ICD-10-CM | POA: Insufficient documentation

## 2019-06-29 DIAGNOSIS — F17210 Nicotine dependence, cigarettes, uncomplicated: Secondary | ICD-10-CM | POA: Insufficient documentation

## 2019-06-29 DIAGNOSIS — M10071 Idiopathic gout, right ankle and foot: Secondary | ICD-10-CM | POA: Insufficient documentation

## 2019-06-29 DIAGNOSIS — I639 Cerebral infarction, unspecified: Secondary | ICD-10-CM

## 2019-06-29 DIAGNOSIS — M10471 Other secondary gout, right ankle and foot: Secondary | ICD-10-CM

## 2019-06-29 MED ORDER — OXYCODONE-ACETAMINOPHEN 5-325 MG PO TABS
1.0000 | ORAL_TABLET | Freq: Once | ORAL | Status: AC
  Administered 2019-06-29: 1 via ORAL
  Filled 2019-06-29: qty 1

## 2019-06-29 MED ORDER — INDOMETHACIN 25 MG PO CAPS: 25.0000 mg | ORAL_CAPSULE | Freq: Three times a day (TID) | ORAL | 3 refills | Status: AC | PRN

## 2019-06-29 MED ORDER — ALLOPURINOL 100 MG PO TABS: 100.0000 mg | ORAL_TABLET | Freq: Every day | ORAL | 0 refills | Status: DC

## 2019-06-29 MED ORDER — OXYCODONE-ACETAMINOPHEN 5-325 MG PO TABS: 2.0000 | ORAL_TABLET | ORAL | 0 refills | Status: AC | PRN

## 2019-06-29 NOTE — ED Notes (Signed)
Patient verbalizes understanding of discharge instructions. Opportunity for questioning and answers were provided. Armband removed by staff, pt discharged from ED.  

## 2019-06-29 NOTE — ED Triage Notes (Signed)
Pt here for gout flare up in R foot x over one week. Sts she has a hx of gout and this feels the same. Taking ibuprofen and icing foot at home with relief, but sts this is lingering longer than usual.

## 2019-06-29 NOTE — ED Provider Notes (Signed)
[place] EMERGENCY DEPARTMENT Provider Note   CSN: [id number] Arrival date & time: 06/29/19  1859     History No chief complaint on file.   Suzanne Graham is a 38 y.o. female.  38 y.o female with a PMH of gout to the ED with a chief complaint of right foot pain x2 days ago.  Patient reports the pain is around the dorsum aspect of it, states her foot feels warmth, is very sore to the touch, is very painful with ambulation.  She has been taking ibuprofen without improvement in symptoms.  She does have a prior history of gout, has run out of her allopurinol medication.  She does have an appointment scheduled with her PCP on January 26 to obtain a refill.  He has also been applying ice to the area without improvement.  She denies any fever, history of IV drug use, trauma.  The history is provided by the patient and medical records.       Past Medical History:  Diagnosis Date  . Asthma   . Depression   . Gout   . Hypertension   . Stroke [facility])     Patient Active Problem List   Diagnosis Date Noted  . 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 Current smoker 10/11/2018  . Generalized pain 10/11/2018  . Chronic nonintractable headache 08/06/2017  . Essential hypertension 08/06/2017  . Stroke [facility]) 07/17/2017    Past Surgical History:  Procedure Laterality Date  . LOOP RECORDER INSERTION N/A 08/19/2017   Procedure: LOOP RECORDER INSERTION;  Surgeon: Thompson Grayer, MD;  Location: [facility];  Service: Cardiovascular;  Laterality: N/A;  . TEE WITHOUT CARDIOVERSION N/A 08/19/2017   Procedure: TRANSESOPHAGEAL ECHOCARDIOGRAM (TEE);  Surgeon: Fay Records, MD;  Location: [facility] ENDOSCOPY;  Service: Cardiovascular;  Laterality: N/A;     OB History   No obstetric history on file.     Family History  Problem Relation Age of Onset  . Heart failure Mother   . Hypertension Mother   . Stroke Neg Hx     Social History   Tobacco Use  . Smoking status: Current  Every Day Smoker    Packs/day: 0.25    Types: Cigarettes  . Smokeless tobacco: Never Used  Substance Use Topics  . Alcohol use: Yes    Comment: occasinally  . Drug use: No    Home Medications Prior to Admission medications   Medication Sig Start Date End Date Taking? Authorizing Provider  albuterol (VENTOLIN HFA) 108 (90 Base) MCG/ACT inhaler INHALE 2 PUFFS INTO THE LUNGS EVERY 4 (FOUR) HOURS AS NEEDED FOR WHEEZING OR SHORTNESS OF BREATH. 05/26/19   [name], FNP  allopurinol (ZYLOPRIM) 100 MG tablet Take 1 tablet (100 mg total) by mouth daily. 06/29/19 07/29/19  Janeece Fitting, PA-C  amLODipine (NORVASC) 10 MG tablet Take 1 tablet (10 mg total) by mouth daily. 09/09/18   [name], FNP  atorvastatin (LIPITOR) 20 MG tablet Take 1 tablet (20 mg total) by mouth daily at 6 PM. 10/09/18   [name], FNP  cyclobenzaprine (FLEXERIL) 10 MG tablet Take 0.5-1 tablets (5-10 mg total) by mouth 2 (two) times daily as needed for muscle spasms. 03/04/19   Harris, Abigail, PA-C  hydrochlorothiazide (HYDRODIURIL) 25 MG tablet TAKE 1 TABLET (25 MG TOTAL) BY MOUTH DAILY. 06/21/19 10/19/19  [name], FNP  ibuprofen (ADVIL,MOTRIN) 200 MG tablet Take 400 mg by mouth every 6 (six) hours as needed for mild  pain.    [provider]  indomethacin (INDOCIN) 25 MG capsule Take 1 capsule (25 mg total) by mouth 3 (three) times daily as needed for up to 15 days. 06/29/19 07/14/19  Claude MangesSoto, Juno Bozard, PA-C  meloxicam (MOBIC) 15 MG tablet Take 1 tablet (15 mg total) by mouth daily. 03/04/19   Arthor CaptainHarris, Abigail, PA-C  oxyCODONE-acetaminophen (PERCOCET/ROXICET) 5-325 MG tablet Take 2 tablets by mouth every 4 (four) hours as needed for up to 3 days for severe pain. 06/29/19 07/02/19  Claude MangesSoto, Artina Minella, PA-C    Allergies    Patient has no known allergies.  Review of Systems   Review of Systems  Constitutional: Negative for fever.  Musculoskeletal: Positive for arthralgias.    Physical Exam Updated  Vital Signs BP (!) 154/102 (BP Location: Right Arm)   Pulse 82   Temp 98.6 F (37 C) (Oral)   Resp 16   SpO2 100%   Physical Exam Vitals and nursing note reviewed.  Constitutional:      General: She is not in acute distress.    Appearance: She is well-developed.  HENT:     Head: Normocephalic and atraumatic.     Mouth/Throat:     Pharynx: No oropharyngeal exudate.  Eyes:     Pupils: Pupils are equal, round, and reactive to light.  Cardiovascular:     Rate and Rhythm: Regular rhythm.     Pulses:          Dorsalis pedis pulses are 2+ on the right side.       Posterior tibial pulses are 2+ on the right side.     Heart sounds: Normal heart sounds.  Pulmonary:     Effort: Pulmonary effort is normal.  Abdominal:     General: Bowel sounds are normal. There is no distension.     Palpations: Abdomen is soft.     Tenderness: There is no abdominal tenderness.  Musculoskeletal:        General: No tenderness or deformity.     Cervical back: Normal range of motion.     Right lower leg: No edema.     Left lower leg: No edema.  Feet:     Right foot:     Skin integrity: Skin integrity normal.     Comments: Pulses present capillary refill is intact.  Warmth to the right dorsum aspect of the foot.  No surrounding erythema, no skin breakdown or fissure. Skin:    General: Skin is warm and dry.  Neurological:     Mental Status: She is alert and oriented to person, place, and time.     ED Results / Procedures / Treatments   Labs (all labs ordered are listed, but only abnormal results are displayed) Labs Reviewed - No data to display  EKG None  Radiology No results found.  Procedures Procedures (including critical care time)  Medications Ordered in ED Medications  oxyCODONE-acetaminophen (PERCOCET/ROXICET) 5-325 MG per tablet 1 tablet (has no administration in time range)    ED Course  I have reviewed the triage vital signs and the nursing notes.  Pertinent labs & imaging  results that were available during my care of the patient were reviewed by me and considered in my medical decision making (see chart for details).    MDM Rules/Calculators/A&P   Patient with a past medical history of gout presents the ED with complaints of right foot pain for the past 2 days.  She reports she has been out of her allopurinol for several  months, states the pain feels like a sharp sensation especially with ambulation, states her foot feels somewhat warm.  There has been no trauma, fevers, prior history of IV drug use.  Arrived in the ED slightly hypertensive, during my evaluation pulses present, capillary refill is intact.  She does have full range of motion of her right foot however there is pain with palpation of the dorsum aspect.  No visual erythema, hematoma, lower suspicion for cellulitis. Symptoms fit with prior episodes of gout, she has been out of her allopurinol.  Will not prescribe her with a prescription for allopurinol until she sees her physician at the beginning of January.  Was provided with medication while waiting in the ED, oxycodone was given for pain control.  Last BMP from 2019 revealed slight elevation in creatinine at 1.2, will not prescribe patient indomethacin along with Percocet to help with her pain.  She also requested a boot, I will order this per patient's preference.  Return precautions discussed at length.  Patient agrees with management, stable for discharge.    Portions of this note were generated with Scientist, clinical (histocompatibility and immunogenetics). Dictation errors may occur despite best attempts at proofreading.  Final Clinical Impression(s) / ED Diagnoses Final diagnoses:  Acute idiopathic gout of right foot    Rx / DC Orders ED Discharge Orders         Ordered    allopurinol (ZYLOPRIM) 100 MG tablet  Daily     06/29/19 2006    oxyCODONE-acetaminophen (PERCOCET/ROXICET) 5-325 MG tablet  Every 4 hours PRN     06/29/19 2006    indomethacin (INDOCIN) 25 MG  capsule  3 times daily PRN     06/29/19 2007           Claude Manges, Cordelia Poche 06/29/19 2046    Arby Barrette, MD 06/30/19 1945

## 2019-06-29 NOTE — ED Notes (Signed)
Paged ortho 

## 2019-06-29 NOTE — Progress Notes (Signed)
Orthopedic Tech Progress Note Patient Details:  Suzanne Graham 1981-03-28 [id number]  Ortho Devices Type of Ortho Device: CAM walker Ortho Device/Splint Location: RLE Ortho Device/Splint Interventions: Ordered, Application, Adjustment   Post Interventions Patient Tolerated: Well Instructions Provided: Adjustment of device, Care of device, Poper ambulation with device   Staci Righter 06/29/2019, 8:35 PM

## 2019-06-29 NOTE — Discharge Instructions (Signed)
I have prescribed medication to help with the pain from your gout, please take Percocet only for severe pain.  I have also prescribed indomethacin, this will help with the acute flareup of your gout, please take 1 tablet as needed, for a maximum of 3 times a day.  The third medication was allopurinol, you may take 1 tablet daily after your symptoms of right foot pain subside, or acute exacerbation has passed.

## 2019-06-30 MED FILL — ?ALLOPURINOL 100MG TABLET: 100 | 30 days supply | Qty: 30 | Fill #0

## 2019-06-30 MED FILL — INDOMETHACIN 25 MG CAPSULE: 25 | 10 days supply | Qty: 30 | Fill #0

## 2019-07-06 ENCOUNTER — Telehealth: Payer: Self-pay | Admitting: *Deleted

## 2019-07-06 ENCOUNTER — Telehealth (HOSPITAL_COMMUNITY): Payer: Self-pay | Admitting: Physician Assistant

## 2019-07-06 MED ORDER — OXYCODONE-ACETAMINOPHEN 5-325 MG PO TABS: 1.0000 | ORAL_TABLET | ORAL | 0 refills | Status: AC | PRN

## 2019-07-06 NOTE — Telephone Encounter (Signed)
Pt called regarding not being able to pick up narcotic at [facility].  [facility] contacted provider to have them send to pharmacy that can fill Rx ([place]).

## 2019-07-06 NOTE — Telephone Encounter (Signed)
I have seen patient last week for acute gout, sent a prescription for Percocet, however [facility] not [facility] therefore I have submitted a new prescription to his pharmacy on [address] via web.

## 2019-07-14 ENCOUNTER — Ambulatory Visit (INDEPENDENT_AMBULATORY_CARE_PROVIDER_SITE_OTHER): Payer: Self-pay | Admitting: *Deleted

## 2019-07-14 DIAGNOSIS — I63432 Cerebral infarction due to embolism of left posterior cerebral artery: Secondary | ICD-10-CM

## 2019-07-14 LAB — CUP PACEART REMOTE DEVICE CHECK
Date Time Interrogation Session: 20210105230849
Implantable Pulse Generator Implant Date: 20190212

## 2019-07-26 MED FILL — ?HYDROCHLOROTHIAZIDE 25MG T: 25 | 30 days supply | Qty: 30 | Fill #1

## 2019-07-26 MED FILL — ?AMLODIPINE BESYLATE 10 MG: 10 | 30 days supply | Qty: 30 | Fill #6

## 2019-08-09 DIAGNOSIS — K219 Gastro-esophageal reflux disease without esophagitis: Secondary | ICD-10-CM

## 2019-08-09 HISTORY — DX: Gastro-esophageal reflux disease without esophagitis: K21.9

## 2019-08-16 ENCOUNTER — Ambulatory Visit (INDEPENDENT_AMBULATORY_CARE_PROVIDER_SITE_OTHER): Payer: Self-pay | Admitting: *Deleted

## 2019-08-16 DIAGNOSIS — I63432 Cerebral infarction due to embolism of left posterior cerebral artery: Secondary | ICD-10-CM

## 2019-08-16 LAB — CUP PACEART REMOTE DEVICE CHECK
Date Time Interrogation Session: 20210208001444
Implantable Pulse Generator Implant Date: 20190212

## 2019-08-16 NOTE — Progress Notes (Signed)
ILR Remote 

## 2019-08-18 ENCOUNTER — Other Ambulatory Visit: Payer: Self-pay

## 2019-08-18 ENCOUNTER — Ambulatory Visit (INDEPENDENT_AMBULATORY_CARE_PROVIDER_SITE_OTHER): Payer: Self-pay | Admitting: Family Medicine

## 2019-08-18 ENCOUNTER — Encounter: Payer: Self-pay | Admitting: Family Medicine

## 2019-08-18 VITALS — BP 116/69 | HR 78 | Temp 98.2°F | Ht 64.0 in | Wt 209.0 lb

## 2019-08-18 DIAGNOSIS — Z131 Encounter for screening for diabetes mellitus: Secondary | ICD-10-CM

## 2019-08-18 DIAGNOSIS — M109 Gout, unspecified: Secondary | ICD-10-CM

## 2019-08-18 DIAGNOSIS — I10 Essential (primary) hypertension: Secondary | ICD-10-CM

## 2019-08-18 DIAGNOSIS — Z09 Encounter for follow-up examination after completed treatment for conditions other than malignant neoplasm: Secondary | ICD-10-CM

## 2019-08-18 LAB — POCT GLYCOSYLATED HEMOGLOBIN (HGB A1C): Hemoglobin A1C: 4.9 % (ref 4.0–5.6)

## 2019-08-18 LAB — GLUCOSE, POCT (MANUAL RESULT ENTRY): POC Glucose: 97 mg/dl (ref 70–99)

## 2019-08-18 MED ORDER — COLCHICINE 0.6 MG PO TABS: 0.6000 mg | ORAL_TABLET | Freq: Three times a day (TID) | ORAL | 2 refills | Status: DC | PRN

## 2019-08-18 MED FILL — !COLCRYS 0.6 MG TABLET: 0.6 MG | 10 days supply | Qty: 30 | Fill #0

## 2019-08-18 NOTE — Patient Instructions (Signed)
Gout  Gout is painful swelling of your joints. Gout is a type of arthritis. It is caused by having too much uric acid in your body. Uric acid is a chemical that is made when your body breaks down substances called purines. If your body has too much uric acid, sharp crystals can form and build up in your joints. This causes pain and swelling. Gout attacks can happen quickly and be very painful (acute gout). Over time, the attacks can affect more joints and happen more often (chronic gout). What are the causes?  Too much uric acid in your blood. This can happen because: ? Your kidneys do not remove enough uric acid from your blood. ? Your body makes too much uric acid. ? You eat too many foods that are high in purines. These foods include organ meats, some seafood, and beer.  Trauma or stress. What increases the risk?  Having a family history of gout.  Being female and middle-aged.  Being female and having gone through menopause.  Being very overweight (obese).  Drinking alcohol, especially beer.  Not having enough water in the body (being dehydrated).  Losing weight too quickly.  Having an organ transplant.  Having lead poisoning.  Taking certain medicines.  Having kidney disease.  Having a skin condition called psoriasis. What are the signs or symptoms? An attack of acute gout usually happens in just one joint. The most common place is the big toe. Attacks often start at night. Other joints that may be affected include joints of the feet, ankle, knee, fingers, wrist, or elbow. Symptoms of an attack may include:  Very bad pain.  Warmth.  Swelling.  Stiffness.  Shiny, red, or purple skin.  Tenderness. The affected joint may be very painful to touch.  Chills and fever. Chronic gout may cause symptoms more often. More joints may be involved. You may also have white or yellow lumps (tophi) on your hands or feet or in other areas near your joints. How is this  treated?  Treatment for this condition has two phases: treating an acute attack and preventing future attacks.  Acute gout treatment may include: ? NSAIDs. ? Steroids. These are taken by mouth or injected into a joint. ? Colchicine. This medicine relieves pain and swelling. It can be given by mouth or through an IV tube.  Preventive treatment may include: ? Taking small doses of NSAIDs or colchicine daily. ? Using a medicine that reduces uric acid levels in your blood. ? Making changes to your diet. You may need to see a food expert (dietitian) about what to eat and drink to prevent gout. Follow these instructions at home: During a gout attack   If told, put ice on the painful area: ? Put ice in a plastic bag. ? Place a towel between your skin and the bag. ? Leave the ice on for 20 minutes, 2-3 times a day.  Raise (elevate) the painful joint above the level of your heart as often as you can.  Rest the joint as much as possible. If the joint is in your leg, you may be given crutches.  Follow instructions from your doctor about what you cannot eat or drink. Avoiding future gout attacks  Eat a low-purine diet. Avoid foods and drinks such as: ? Liver. ? Kidney. ? Anchovies. ? Asparagus. ? Herring. ? Mushrooms. ? Mussels. ? Beer.  Stay at a healthy weight. If you want to lose weight, talk with your doctor. Do not lose weight   too fast.  Start or continue an exercise plan as told by your doctor. Eating and drinking  Drink enough fluids to keep your pee (urine) pale yellow.  If you drink alcohol: ? Limit how much you use to:  0-1 drink a day for women.  0-2 drinks a day for men. ? Be aware of how much alcohol is in your drink. In the [place] one drink equals one 12 oz bottle of beer (355 mL), one 5 oz glass of wine (148 mL), or one 1 oz glass of hard liquor (44 mL). General instructions  Take over-the-counter and prescription medicines only as told by your doctor.  Do  not drive or use heavy machinery while taking prescription pain medicine.  Return to your normal activities as told by your doctor. Ask your doctor what activities are safe for you.  Keep all follow-up visits as told by your doctor. This is important. Contact a doctor if:  You have another gout attack.  You still have symptoms of a gout attack after 10 days of treatment.  You have problems (side effects) because of your medicines.  You have chills or a fever.  You have burning pain when you pee (urinate).  You have pain in your lower back or belly. Get help right away if:  You have very bad pain.  Your pain cannot be controlled.  You cannot pee. Summary  Gout is painful swelling of the joints.  The most common site of pain is the big toe, but it can affect other joints.  Medicines and avoiding some foods can help to prevent and treat gout attacks. This information is not intended to replace advice given to you by your health care provider. Make sure you discuss any questions you have with your health care provider. Document Revised: 01/14/2018 Document Reviewed: 01/14/2018 Elsevier Patient Education  2020 [employer]    Low-Purine Eating Plan A low-purine eating plan involves making food choices to limit your intake of purine. Purine is a kind of uric acid. Too much uric acid in your blood can cause certain conditions, such as gout and kidney stones. Eating a low-purine diet can help control these conditions. What are tips for following this plan? Reading food labels   Avoid foods with saturated or Trans fat.  Check the ingredient list of grains-based foods, such as bread and cereal, to make sure that they contain whole grains.  Check the ingredient list of sauces or soups to make sure they do not contain meat or fish.  When choosing soft drinks, check the ingredient list to make sure they do not contain high-fructose corn syrup. Shopping  Buy plenty of fresh  fruits and vegetables.  Avoid buying canned or fresh fish.  Buy dairy products labeled as low-fat or nonfat.  Avoid buying premade or processed foods. These foods are often high in fat, salt (sodium), and added sugar. Cooking  Use olive oil instead of butter when cooking. Oils like olive oil, canola oil, and sunflower oil contain healthy fats. Meal planning  Learn which foods do or do not affect you. If you find out that a food tends to cause your gout symptoms to flare up, avoid eating that food. You can enjoy foods that do not cause problems. If you have any questions about a food item, talk with your dietitian or health care provider.  Limit foods high in fat, especially saturated fat. Fat makes it harder for your body to get rid of uric acid.    are lower in fat and are lean sources of protein. General guidelines  Limit alcohol intake to no more than 1 drink a day for nonpregnant women and 2 drinks a day for men. One drink equals 12 oz of beer, 5 oz of wine, or 1 oz of hard liquor. Alcohol can affect the way your body gets rid of uric acid.  Drink plenty of water to keep your urine clear or pale yellow. Fluids can help remove uric acid from your body.  If directed by your health care provider, take a vitamin C supplement.  Work with your health care provider and dietitian to develop a plan to achieve or maintain a healthy weight. Losing weight can help reduce uric acid in your blood. What foods are recommended? The items listed may not be a complete list. Talk with your dietitian about what dietary choices are best for you. Foods low in purines Foods low in purines do not need to be limited. These include:  All fruits.  All low-purine vegetables, pickles, and olives.  Breads, pasta, rice, cornbread, and popcorn. Cake and other baked goods.  All dairy foods.  Eggs, nuts, and nut butters.  Spices and condiments, such as salt, herbs, and vinegar.  Plant oils,  butter, and margarine.  Water, sugar-free soft drinks, tea, coffee, and cocoa.  Vegetable-based soups, broths, sauces, and gravies. Foods moderate in purines Foods moderate in purines should be limited to the amounts listed.   cup of asparagus, cauliflower, spinach, mushrooms, or green peas, each day.  2/3 cup uncooked oatmeal, each day.   cup dry wheat bran or wheat germ, each day.  2-3 ounces of meat or poultry, each day.  4-6 ounces of shellfish, such as crab, lobster, oysters, or shrimp, each day.  1 cup cooked beans, peas, or lentils, each day.  Soup, broths, or bouillon made from meat or fish. Limit these foods as much as possible. What foods are not recommended? The items listed may not be a complete list. Talk with your dietitian about what dietary choices are best for you. Limit your intake of foods high in purines, including:  Beer and other alcohol.  Meat-based gravy or sauce.  Canned or fresh fish, such as: ? Anchovies, sardines, herring, and tuna. ? Mussels and scallops. ? Codfish, trout, and haddock.  Tomasa Blase.  Organ meats, such as: ? Liver or kidney. ? Tripe. ? Sweetbreads (thymus gland or pancreas).  Wild [employer] officer, environmental.  Yeast or yeast extract supplements.  Drinks sweetened with high-fructose corn syrup. Summary  Eating a low-purine diet can help control conditions caused by too much uric acid in the body, such as gout or kidney stones.  Choose low-purine foods, limit alcohol, and limit foods high in fat.  You will learn over time which foods do or do not affect you. If you find out that a food tends to cause your gout symptoms to flare up, avoid eating that food. This information is not intended to replace advice given to you by your health care provider. Make sure you discuss any questions you have with your health care provider. Document Revised: 06/06/2017 Document Reviewed: 08/07/2016 Elsevier Patient Education  2020 [employer] Uric  Acid Test Why am I having this test? Uric acid is a chemical that gets released when certain substances in the body (purines) break down. Normally, uric acid is filtered out of the blood by the kidneys, and then it leaves the body through urine. If your body makes too much  uric acid, or if your kidneys are not removing enough of it, uric acid can start to form crystals that build up in your joints or kidneys. Uric acid crystals in your joints can cause a type of arthritis (gout). Crystals in your urine can form kidney stones. You may have a uric acid test:  If you have joint pain or swelling that may be caused by gout.  If you frequently have kidney stones.  To help diagnose or monitor treatment of gout.  To monitor radiation or chemotherapy treatment.  To monitor kidney function or diagnose kidney disorders. What is being tested? This test measures the amount of uric acid in your blood or urine. What kind of sample is taken?     This test may be done with a blood sample or a urine sample.  A blood sample is usually collected by inserting a needle into a blood vessel. You may have a blood sample taken if: ? You are receiving treatment that increases purines in your body, such as radiation or chemotherapy. ? You have joint pain that may be caused by gout.  You may have a urine sample taken if you have kidney stones. You may be asked to collect urine samples at home over a period of 24 hours. How do I collect samples at home? When collecting a urine sample at home, make sure you:  Use supplies and instructions that you received from the lab.  Collect urine only in the germ-free (sterile) cup that you received from the lab.  Do not let any toilet paper or stool (feces) get into the cup.  Refrigerate the sample until you can return it to the lab.  Return the sample(s) to the lab as instructed. How do I prepare for this test? Follow instructions from your health care provider  about:  Eating or drinking restrictions. You may need to stop eating and drinking everything except water starting 4 hours before the test.  Changing or stopping your regular medicines. Some medicines can affect uric acid levels. Tell a health care provider about:  Recent intense exercise. If you have recently exercised a lot, this may affect your test results.  Any allergies you have.  All medicines you are taking, including vitamins, herbs, eye drops, creams, and over-the-counter medicines.  Any blood disorders you have.  Any surgeries you have had.  Any medical conditions you have.  Whether you are pregnant or may be pregnant. How are the results reported? Your results will be reported as a value that indicates how much uric acid is in your blood or urine. Your health care provider will compare your results to normal ranges that were established after testing a large group of people (reference ranges). Reference ranges may vary among labs and hospitals. For this test, common reference ranges are:  Blood test: ? Adult female: 4.0-8.5 mg/dL or 0.24-0.51 mmol/L. ? Adult female: 2.7-7.3 mg/dL or 0.16-0.43 mmol/L. ? Child: 2.5-5.5 mg/dL or 0.12-0.32 mmol/L. ? Newborn: 2.0-6.2 mg/dL.  Urine test: 250-750 mg/24 hr or 1.48-4.43 mmol/day (SI units). What do the results mean? Results within your reference range are considered normal, meaning that you have a normal amount of uric acid in your body. Results that are higher than your reference range may mean that:  Your body is making too much uric acid.  Your kidneys are not removing enough uric acid.  Your gout treatment plan needs to be adjusted, if applicable. You may need more tests to determine what is causing  high uric acid levels. Possible causes include:  Gout.  Kidney disease.  Cancer or cancer treatment.  A diet high in purines.  Alcohol abuse.  Diabetes (diabetes mellitus). Results that are below your reference  range mean that you have too little uric acid in your body. This is usually caused by certain medicines and is usually not serious. Talk with your health care provider about what your results mean. Questions to ask your health care provider Ask your health care provider, or the department that is doing the test:  When will my results be ready?  How will I get my results?  What are my treatment options?  What other tests do I need?  What are my next steps? Summary  Uric acid is a chemical that gets released when certain substances in the body (purines) break down.  If your body makes too much uric acid, or if your kidneys are not removing enough of it, uric acid can start to form crystals that build up in your joints or kidneys.  Uric acid crystals in your joints can cause a type of arthritis (gout). Crystals in your urine can form kidney stones.  This test measures the amount of uric acid in your blood or urine. This information is not intended to replace advice given to you by your health care provider. Make sure you discuss any questions you have with your health care provider. Document Revised: 09/23/2017 Document Reviewed: 03/06/2017 Elsevier Patient Education  2020 [employer].

## 2019-08-18 NOTE — Progress Notes (Signed)
[name]  Sick Visit   Subjective:  Patient ID: Suzanne Graham, female    DOB: 1980/09/04  Age: 39 y.o. MRN: [id number]  CC:  Chief Complaint  Patient presents with  . Follow-up    6 month follow up  . Knee Pain    left knee pain    HPI Suzanne Graham is a 39 year old female who presents for Follow Up today.   Past Medical History:  Diagnosis Date  . Asthma   . Depression   . Gout   . Hypertension   . Stroke [facility])    Current Status: Since her last office visit, she has c/o increased gouty episodes. She is currently taking Allopurinol daily a prescribed. She denies visual changes, chest pain, cough, shortness of breath, heart palpitations, and falls. She has occasional headaches and dizziness with position changes. Denies severe headaches, confusion, seizures, double vision, and blurred vision, nausea and vomiting. Her anxiety is mild today. She denies suicidal ideations, homicidal ideations, or auditory hallucinations. She denies fevers, chills,  recent infections, weight loss, and night sweats.  No reports of GI problems such as nausea, vomiting, diarrhea, and constipation. She has no reports of blood in stools, dysuria and hematuria.   Past Surgical History:  Procedure Laterality Date  . LOOP RECORDER INSERTION N/A 08/19/2017   Procedure: LOOP RECORDER INSERTION;  Surgeon: Thompson Grayer, MD;  Location: [facility];  Service: Cardiovascular;  Laterality: N/A;  . TEE WITHOUT CARDIOVERSION N/A 08/19/2017   Procedure: TRANSESOPHAGEAL ECHOCARDIOGRAM (TEE);  Surgeon: Fay Records, MD;  Location: [facility] ENDOS[facility];  Service: Cardiovascular;  Laterality: N/A;    Family History  Problem Relation Age of Onset  . Heart failure Mother   . Hypertension Mother   . Stroke Neg Hx     Social History   Socioeconomic History  . Marital status: Single    Spouse name: Not on file  . Number of children: Not on file  . Years of  education: Not on file  . Highest education level: Not on file  Occupational History  . Not on file  Tobacco Use  . Smoking status: Current Every Day Smoker    Packs/day: 0.25    Types: Cigarettes  . Smokeless tobacco: Never Used  Substance and Sexual Activity  . Alcohol use: Yes    Comment: rare  . Drug use: No  . Sexual activity: Yes  Other Topics Concern  . Not on file  Social History Narrative  . Not on file   Social Determinants of Health   Financial Resource Strain:   . Difficulty of Paying Living Expenses: Not on file  Food Insecurity:   . Worried About Charity fundraiser in the Last Year: Not on file  . Ran Out of Food in the Last Year: Not on file  Transportation Needs:   . Lack of Transportation (Medical): Not on file  . Lack of Transportation (Non-Medical): Not on file  Physical Activity:   . Days of Exercise per Week: Not on file  . Minutes of Exercise per Session: Not on file  Stress:   . Feeling of Stress : Not on file  Social Connections:   . Frequency of Communication with Friends and Family: Not on file  . Frequency of Social Gatherings with Friends and Family: Not on file  . Attends Religious Services: Not on file  . Active Member of Clubs or Organizations: Not on file  .  Attends Banker Meetings: Not on file  . Marital Status: Not on file  Intimate Partner Violence:   . Fear of Current or Ex-Partner: Not on file  . Emotionally Abused: Not on file  . Physically Abused: Not on file  . Sexually Abused: Not on file    Outpatient Medications Prior to Visit  Medication Sig Dispense Refill  . albuterol (VENTOLIN HFA) 108 (90 Base) MCG/ACT inhaler INHALE 2 PUFFS INTO THE LUNGS EVERY 4 (FOUR) HOURS AS NEEDED FOR WHEEZING OR SHORTNESS OF BREATH. 8.5 g 11  . allopurinol (ZYLOPRIM) 100 MG tablet Take 1 tablet (100 mg total) by mouth daily. 30 tablet 0  . amLODipine (NORVASC) 10 MG tablet Take 1 tablet (10 mg total) by mouth daily. 30 tablet 2    . atorvastatin (LIPITOR) 20 MG tablet Take 1 tablet (20 mg total) by mouth daily at 6 PM. 30 tablet 3  . cyclobenzaprine (FLEXERIL) 10 MG tablet Take 0.5-1 tablets (5-10 mg total) by mouth 2 (two) times daily as needed for muscle spasms. 20 tablet 0  . hydrochlorothiazide (HYDRODIURIL) 25 MG tablet TAKE 1 TABLET (25 MG TOTAL) BY MOUTH DAILY. 30 tablet 1  . ibuprofen (ADVIL,MOTRIN) 200 MG tablet Take 400 mg by mouth every 6 (six) hours as needed for mild pain.    . meloxicam (MOBIC) 15 MG tablet Take 1 tablet (15 mg total) by mouth daily. 10 tablet 0   No facility-administered medications prior to visit.    No Known Allergies  ROS Review of Systems  Constitutional: Negative.   HENT: Negative.   Eyes: Negative.   Respiratory: Negative.   Cardiovascular: Negative.   Gastrointestinal: Negative.   Endocrine: Negative.   Genitourinary: Negative.   Musculoskeletal: Negative.        Gout in right foot  Skin: Negative.   Allergic/Immunologic: Negative.   Neurological: Negative.   Hematological: Negative.   Psychiatric/Behavioral: Negative.       Objective:    Physical Exam  Constitutional: She is oriented to person, place, and time. She appears well-developed and well-nourished.  Arrives via wheelchair  HENT:  Head: Normocephalic and atraumatic.  Eyes: Conjunctivae are normal.  Cardiovascular: Normal rate, regular rhythm, normal heart sounds and intact distal pulses.  Pulmonary/Chest: Effort normal and breath sounds normal.  Abdominal: Soft. Bowel sounds are normal.  Musculoskeletal:        General: Normal range of motion.     Cervical back: Normal range of motion and neck supple.  Neurological: She is alert and oriented to person, place, and time. She has normal reflexes.  Skin: Skin is warm and dry.  Psychiatric: She has a normal mood and affect. Her behavior is normal. Judgment and thought content normal.  Nursing note and vitals reviewed.   BP 116/69   Pulse 78    Temp 98.2 F (36.8 C) (Oral)   Ht 5\' 4"  (1.626 m)   Wt 209 lb (94.8 kg) Comment: est unable to stand due to gout flare up  LMP 08/18/2019   SpO2 100%   BMI 35.87 kg/m  Wt Readings from Last 3 Encounters:  08/18/19 209 lb (94.8 kg)  03/04/19 202 lb (91.6 kg)  02/15/19 209 lb (94.8 kg)     Health Maintenance Due  Topic Date Due  . PAP SMEAR-Modifier  08/09/1999  . INFLUENZA VACCINE  02/06/2019    There are no preventive care reminders to display for this patient.  No results found for: TSH Lab Results  Component Value  Date   WBC 8.6 06/10/2018   HGB 10.4 (L) 06/10/2018   HCT 34.5 (L) 06/10/2018   MCV 78.8 (L) 06/10/2018   PLT 271 06/10/2018   Lab Results  Component Value Date   NA 138 06/10/2018   K 4.2 06/10/2018   CO2 24 06/10/2018   GLUCOSE 90 06/10/2018   BUN 15 06/10/2018   CREATININE 1.46 (H) 06/10/2018   BILITOT 0.4 09/09/2017   ALKPHOS 54 09/09/2017   AST 21 09/09/2017   ALT 17 09/09/2017   PROT 7.6 09/09/2017   ALBUMIN 4.1 09/09/2017   CALCIUM 9.0 06/10/2018   ANIONGAP 9 06/10/2018   Lab Results  Component Value Date   CHOL 162 07/18/2017   Lab Results  Component Value Date   HDL 48 07/18/2017   Lab Results  Component Value Date   LDLCALC 98 07/18/2017   Lab Results  Component Value Date   TRIG 80 07/18/2017   Lab Results  Component Value Date   CHOLHDL 3.4 07/18/2017   Lab Results  Component Value Date   HGBA1C 4.9 08/18/2019      Assessment & Plan:   1. Acute gout of knee, unspecified cause, unspecified laterality We will initiate Colchicine today.  - colchicine 0.6 MG tablet; Take 1 tablet (0.6 mg total) by mouth every 8 (eight) hours as needed (for Gouty attacks).  Dispense: 30 tablet; Refill: 2  2. Essential hypertension The current medical regimen is effective; blood pressure is stable at 116/69 today; continue present plan and medications as prescribed.  She will continue to take medications as prescribed, to decrease  high sodium intake, excessive alcohol intake, increase potassium intake, smoking cessation, and increase physical activity of at least 30 minutes of cardio activity daily. She will continue to follow Heart Healthy or DASH diet.  3. Screening for diabetes mellitus - POCT glycosylated hemoglobin (Hb A1C)  4. Follow up She will follow up in 3 months.  - POCT urinalysis dipstick - Vaginitis/Vaginosis, DNA Probe - POCT glucose (manual entry) - POCT glycosylated hemoglobin (Hb A1C)  Meds ordered this encounter  Medications  . colchicine 0.6 MG tablet    Sig: Take 1 tablet (0.6 mg total) by mouth every 8 (eight) hours as needed (for Gouty attacks).    Dispense:  30 tablet    Refill:  2    Orders Placed This Encounter  Procedures  . Vaginitis/Vaginosis, DNA Probe  . POCT urinalysis dipstick  . POCT glucose (manual entry)  . POCT glycosylated hemoglobin (Hb A1C)    Referral Orders  No referral(s) requested today    Raliegh Ip,  MSN, FNP-BC [facility] [address] [place], [place] [address] [phone] fax    Problem List Items Addressed This Visit      Cardiovascular and Mediastinum   Essential hypertension    Other Visit Diagnoses    Acute gout of knee, unspecified cause, unspecified laterality    -  Primary   Relevant Medications   colchicine 0.6 MG tablet   Screening for diabetes mellitus       Relevant Orders   POCT glycosylated hemoglobin (Hb A1C) (Completed)   Follow up       Relevant Orders   POCT urinalysis dipstick   Vaginitis/Vaginosis, DNA Probe   POCT glucose (manual entry) (Completed)   POCT glycosylated hemoglobin (Hb A1C) (Completed)      Meds ordered this encounter  Medications  . colchicine 0.6 MG  tablet    Sig: Take 1 tablet (0.6 mg total) by mouth every 8 (eight) hours as needed (for Gouty attacks).    Dispense:  30 tablet    Refill:  2     Follow-up: Return in about 3 months (around 11/15/2019).    Kallie Locks, FNP

## 2019-08-22 ENCOUNTER — Other Ambulatory Visit: Payer: Self-pay | Admitting: Family Medicine

## 2019-08-22 ENCOUNTER — Encounter: Payer: Self-pay | Admitting: Family Medicine

## 2019-08-22 DIAGNOSIS — K219 Gastro-esophageal reflux disease without esophagitis: Secondary | ICD-10-CM

## 2019-08-22 MED ORDER — FAMOTIDINE 20 MG PO TABS: 20.0000 mg | ORAL_TABLET | Freq: Two times a day (BID) | ORAL | 6 refills | Status: DC

## 2019-08-23 MED FILL — FAMOTIDINE 20 MG TABS: 20 | 30 days supply | Qty: 60 | Fill #0

## 2019-08-25 ENCOUNTER — Other Ambulatory Visit: Payer: Self-pay | Admitting: Family Medicine

## 2019-08-25 DIAGNOSIS — I10 Essential (primary) hypertension: Secondary | ICD-10-CM

## 2019-08-25 NOTE — Telephone Encounter (Signed)
Amlodipine & HCTZ refill request. Do you want to refill? Patient has a future appointment with Crystal NP.

## 2019-08-26 MED FILL — ?HYDROCHLOROTHIAZIDE 25MG T: 25 | 30 days supply | Qty: 30 | Fill #0

## 2019-08-26 MED FILL — ?AMLODIPINE BESYL 10MG TABL: 10 | 30 days supply | Qty: 30 | Fill #0

## 2019-09-16 ENCOUNTER — Ambulatory Visit (INDEPENDENT_AMBULATORY_CARE_PROVIDER_SITE_OTHER): Payer: Self-pay | Admitting: *Deleted

## 2019-09-16 DIAGNOSIS — I63432 Cerebral infarction due to embolism of left posterior cerebral artery: Secondary | ICD-10-CM

## 2019-09-16 LAB — CUP PACEART REMOTE DEVICE CHECK
Date Time Interrogation Session: 20210311005743
Implantable Pulse Generator Implant Date: 20190212

## 2019-09-16 NOTE — Progress Notes (Signed)
ILR Remote 

## 2019-09-20 MED FILL — !COLCRYS 0.6 MG TABLET: 0.6 MG | 10 days supply | Qty: 30 | Fill #1

## 2019-09-29 MED FILL — ?HYDROCHLOROTHIAZIDE 25MG T: 25 | 30 days supply | Qty: 30 | Fill #1

## 2019-09-29 MED FILL — AMLODIPINE BESYLATE 10 MG T: 10 | 30 days supply | Qty: 30 | Fill #1

## 2019-10-17 LAB — CUP PACEART REMOTE DEVICE CHECK
Date Time Interrogation Session: 20210411035521
Implantable Pulse Generator Implant Date: 20190212

## 2019-10-18 ENCOUNTER — Ambulatory Visit (INDEPENDENT_AMBULATORY_CARE_PROVIDER_SITE_OTHER): Payer: Self-pay | Admitting: *Deleted

## 2019-10-18 DIAGNOSIS — I63432 Cerebral infarction due to embolism of left posterior cerebral artery: Secondary | ICD-10-CM

## 2019-10-18 NOTE — Progress Notes (Signed)
ILR Remote 

## 2019-11-01 ENCOUNTER — Other Ambulatory Visit: Payer: Self-pay | Admitting: Family Medicine

## 2019-11-01 DIAGNOSIS — I10 Essential (primary) hypertension: Secondary | ICD-10-CM

## 2019-11-01 MED FILL — AMLODIPINE BESYLATE 10 MG T: 10 | 30 days supply | Qty: 30 | Fill #2

## 2019-11-04 MED FILL — HYDROCHLOROTHIAZIDE 25 MG T: 25 | 30 days supply | Qty: 30 | Fill #0

## 2019-11-15 ENCOUNTER — Ambulatory Visit (INDEPENDENT_AMBULATORY_CARE_PROVIDER_SITE_OTHER): Payer: Self-pay | Admitting: Nurse Practitioner

## 2019-11-15 ENCOUNTER — Encounter: Payer: Self-pay | Admitting: Nurse Practitioner

## 2019-11-15 ENCOUNTER — Other Ambulatory Visit: Payer: Self-pay

## 2019-11-15 VITALS — BP 128/77 | Ht 64.0 in | Wt 205.6 lb

## 2019-11-15 DIAGNOSIS — Z0000 Encounter for general adult medical examination without abnormal findings: Secondary | ICD-10-CM

## 2019-11-15 DIAGNOSIS — I10 Essential (primary) hypertension: Secondary | ICD-10-CM

## 2019-11-15 DIAGNOSIS — Z8673 Personal history of transient ischemic attack (TIA), and cerebral infarction without residual deficits: Secondary | ICD-10-CM

## 2019-11-15 DIAGNOSIS — M1000 Idiopathic gout, unspecified site: Secondary | ICD-10-CM

## 2019-11-15 DIAGNOSIS — Z131 Encounter for screening for diabetes mellitus: Secondary | ICD-10-CM

## 2019-11-15 LAB — POCT GLUCOSE (DEVICE FOR HOME USE)
Glucose Fasting, POC: 75 mg/dL (ref 70–99)
POC Glucose: 75 mg/dl (ref 70–99)

## 2019-11-15 LAB — POCT GLYCOSYLATED HEMOGLOBIN (HGB A1C)
HbA1c POC (<> result, manual entry): 5 % (ref 4.0–5.6)
HbA1c, POC (controlled diabetic range): 5 % (ref 0.0–7.0)
HbA1c, POC (prediabetic range): 5 % — AB (ref 5.7–6.4)
Hemoglobin A1C: 5 % (ref 4.0–5.6)

## 2019-11-15 NOTE — Patient Instructions (Signed)
Preventing Unhealthy Weight Gain, Adult Staying at a healthy weight is important to your overall health. When fat builds up in your body, you may become overweight or obese. Being overweight or obese increases your risk of developing certain health problems, such as heart disease, diabetes, sleeping problems, joint problems, and some types of cancer. Unhealthy weight gain is often the result of making unhealthy food choices or not getting enough exercise. You can make changes to your lifestyle to prevent obesity and stay as healthy as possible. What nutrition changes can be made?   Eat only as much as your body needs. To do this: ? Pay attention to signs that you are hungry or full. Stop eating as soon as you feel full. ? If you feel hungry, try drinking water first before eating. Drink enough water so your urine is clear or pale yellow. ? Eat smaller portions. Pay attention to portion sizes when eating out. ? Look at serving sizes on food labels. Most foods contain more than one serving per container. ? Eat the recommended number of calories for your gender and activity level. For most active people, a daily total of 2,000 calories is appropriate. If you are trying to lose weight or are not very active, you may need to eat fewer calories. Talk with your health care provider or a diet and nutrition specialist (dietitian) about how many calories you need each day.  Choose healthy foods, such as: ? Fruits and vegetables. At each meal, try to fill at least half of your plate with fruits and vegetables. ? Whole grains, such as whole-wheat bread, Mcwhirter rice, and quinoa. ? Lean meats, such as chicken or fish. ? Other healthy proteins, such as beans, eggs, or tofu. ? Healthy fats, such as nuts, seeds, fatty fish, and olive oil. ? Low-fat or fat-free dairy products.  Check food labels, and avoid food and drinks that: ? Are high in calories. ? Have added sugar. ? Are high in sodium. ? Have saturated  fats or trans fats.  Cook foods in healthier ways, such as by baking, broiling, or grilling.  Make a meal plan for the week, and shop with a grocery list to help you stay on track with your purchases. Try to avoid going to the grocery store when you are hungry.  When grocery shopping, try to shop around the outside of the store first, where the fresh foods are. Doing this helps you to avoid prepackaged foods, which can be high in sugar, salt (sodium), and fat. What lifestyle changes can be made?   Exercise for 30 or more minutes on 5 or more days each week. Exercising may include brisk walking, yard work, biking, running, swimming, and team sports like basketball and soccer. Ask your health care provider which exercises are safe for you.  Do muscle-strengthening activities, such as lifting weights or using resistance bands, on 2 or more days a week.  Do not use any products that contain nicotine or tobacco, such as cigarettes and e-cigarettes. If you need help quitting, ask your health care provider.  Limit alcohol intake to no more than 1 drink a day for nonpregnant women and 2 drinks a day for men. One drink equals 12 oz of beer, 5 oz of wine, or 1 oz of hard liquor.  Try to get 7-9 hours of sleep each night. What other changes can be made?  Keep a food and activity journal to keep track of: ? What you ate and how many calories   you had. Remember to count the calories in sauces, dressings, and side dishes. ? Whether you were active, and what exercises you did. ? Your calorie, weight, and activity goals.  Check your weight regularly. Track any changes. If you notice you have gained weight, make changes to your diet or activity routine.  Avoid taking weight-loss medicines or supplements. Talk to your health care provider before starting any new medicine or supplement.  Talk to your health care provider before trying any new diet or exercise plan. Why are these changes  important? Eating healthy, staying active, and having healthy habits can help you to prevent obesity. Those changes also:  Help you manage stress and emotions.  Help you connect with friends and family.  Improve your self-esteem.  Improve your sleep.  Prevent long-term health problems. What can happen if changes are not made? Being obese or overweight can cause you to develop joint or bone problems, which can make it hard for you to stay active or do activities you enjoy. Being obese or overweight also puts stress on your heart and lungs and can lead to health problems like diabetes, heart disease, and some cancers. Where to find more information Talk with your health care provider or a dietitian about healthy eating and healthy lifestyle choices. You may also find information from:  U.S. Department of Agriculture, MyPlate: https://ball-collins.biz/  American Heart Association: www.heart.org  Centers for Disease Control and Prevention: FootballExhibition.com.br Summary  Staying at a healthy weight is important to your overall health. It helps you to prevent certain diseases and health problems, such as heart disease, diabetes, joint problems, sleep disorders, and some types of cancer.  Being obese or overweight can cause you to develop joint or bone problems, which can make it hard for you to stay active or do activities you enjoy.  You can prevent unhealthy weight gain by eating a healthy diet, exercising regularly, not smoking, limiting alcohol, and getting enough sleep.  Talk with your health care provider or a dietitian for guidance about healthy eating and healthy lifestyle choices. This information is not intended to replace advice given to you by your health care provider. Make sure you discuss any questions you have with your health care provider. Document Revised: 06/27/2017 Document Reviewed: 07/31/2016 Elsevier Patient Education  2020 [employer]. Pap Test Why am I having this test? A  Pap test, also called a Pap smear, is a screening test to check for signs of:  Cancer of the vagina, cervix, and uterus. The cervix is the lower part of the uterus that opens into the vagina.  Infection.  Changes that may be a sign that cancer is developing (precancerous changes). Women need this test on a regular basis. In general, you should have a Pap test every 3 years until you reach menopause or age 11. Women aged 30-60 may choose to have their Pap test done at the same time as an HPV (human papillomavirus) test every 5 years (instead of every 3 years). Your health care provider may recommend having Pap tests more or less often depending on your medical conditions and past Pap test results. What kind of sample is taken?  Your health care provider will collect a sample of cells from the surface of your cervix. This will be done using a small cotton swab, plastic spatula, or brush. This sample is often collected during a pelvic exam, when you are lying on your back on an exam table with feet in footrests (stirrups). In some  cases, fluids (secretions) from the cervix or vagina may also be collected. How do I prepare for this test?  Be aware of where you are in your menstrual cycle. If you are menstruating on the day of the test, you may be asked to reschedule.  You may need to reschedule if you have a known vaginal infection on the day of the test.  Follow instructions from your health care provider about: ? Changing or stopping your regular medicines. Some medicines can cause abnormal test results, such as digitalis and tetracycline. ? Avoiding douching or taking a bath the day before or the day of the test. Tell a health care provider about:  Any allergies you have.  All medicines you are taking, including vitamins, herbs, eye drops, creams, and over-the-counter medicines.  Any blood disorders you have.  Any surgeries you have had.  Any medical conditions you have.  Whether you  are pregnant or may be pregnant. How are the results reported? Your test results will be reported as either abnormal or normal. A false-positive result can occur. A false positive is incorrect because it means that a condition is present when it is not. A false-negative result can occur. A false negative is incorrect because it means that a condition is not present when it is. What do the results mean? A normal test result means that you do not have signs of cancer of the vagina, cervix, or uterus. An abnormal result may mean that you have:  Cancer. A Pap test by itself is not enough to diagnose cancer. You will have more tests done in this case.  Precancerous changes in your vagina, cervix, or uterus.  Inflammation of the cervix.  An STD (sexually transmitted disease).  A fungal infection.  A parasite infection. Talk with your health care provider about what your results mean. Questions to ask your health care provider Ask your health care provider, or the department that is doing the test:  When will my results be ready?  How will I get my results?  What are my treatment options?  What other tests do I need?  What are my next steps? Summary  In general, women should have a Pap test every 3 years until they reach menopause or age 55.  Your health care provider will collect a sample of cells from the surface of your cervix. This will be done using a small cotton swab, plastic spatula, or brush.  In some cases, fluids (secretions) from the cervix or vagina may also be collected. This information is not intended to replace advice given to you by your health care provider. Make sure you discuss any questions you have with your health care provider. Document Revised: 03/03/2017 Document Reviewed: 03/03/2017 Elsevier Patient Education  [place] A mammogram is an X-ray of the breasts that is done to check for changes that are not normal. This test can screen  for and find any changes that may suggest breast cancer. Mammograms are regularly done on women. A man may have a mammogram if he has a lump or swelling in his breast. This test can also help to find other changes and variations in the breast. Tell a doctor:  About any allergies you have.  If you have breast implants.  If you have had previous breast disease, biopsy, or surgery.  If you are breastfeeding.  If you are younger than age 41.  If you have a family history of breast cancer.  Whether you are  pregnant or may be pregnant. What are the risks? Generally, this is a safe procedure. However, problems may occur, including:  Exposure to radiation. Radiation levels are very low with this test.  The results being misinterpreted.  The need for further tests.  The inability of the mammogram to detect certain cancers. What happens before the procedure?  Have this test done about 1-2 weeks after your period. This is usually when your breasts are the least tender.  If you are visiting a new doctor or clinic, send any past mammogram images to your new doctor's office.  Wash your breasts and under your arms the day of the test.  Do not use deodorants, perfumes, lotions, or powders on the day of the test.  Take off any jewelry from your neck.  Wear clothes that you can change into and out of easily. What happens during the procedure?   You will undress from the waist up. You will put on a gown.  You will stand in front of the X-ray machine.  Each breast will be placed between two plastic or glass plates. The plates will press down on your breast for a few seconds. Try to stay as relaxed as possible. This does not cause any harm to your breasts. Any discomfort you feel will be very brief.  X-rays will be taken from different angles of each breast. The procedure may vary among doctors and hospitals. What happens after the procedure?  The mammogram will be read by a specialist  (radiologist).  You may need to do certain parts of the test again. This depends on the quality of the images.  Ask when your test results will be ready. Make sure you get your test results.  You may go back to your normal activities. Summary  A mammogram is a low energy X-ray of the breasts that is done to check for abnormal changes. A man may have this test if he has a lump or swelling in his breast.  Before the procedure, tell your doctor about any breast problems that you have had in the past.  Have this test done about 1-2 weeks after your period.  For the test, each breast will be placed between two plastic or glass plates. The plates will press down on your breast for a few seconds.  The mammogram will be read by a specialist (radiologist). Ask when your test results will be ready. Make sure you get your test results. This information is not intended to replace advice given to you by your health care provider. Make sure you discuss any questions you have with your health care provider. Document Revised: 02/12/2018 Document Reviewed: 02/12/2018 Elsevier Patient Education  2020 [employer].

## 2019-11-15 NOTE — Progress Notes (Signed)
[facility] Patient [facility] [address] [place], [place]  [address] Phone:  [phone]   Fax:  [phone]   Established Patient Office Visit  Subjective:  Patient ID: Suzanne Graham, female    DOB: [birth year]  Age: 39 y.o. MRN: [id number]  CC:  Chief Complaint  Patient presents with  . Follow-up    follow up 3 months     HPI Suzanne Graham presents for follow up. She  has a past medical history of Acid reflux (08/2019), Asthma, Depression, Gout, Hypertension, and Stroke (HCC).   Subjective:     Suzanne Graham is a 39 y.o. female who presents for follow up of a stroke. Event occurred 5 years ago. ago. She has residual symptoms of loss of vision on the left. She denies balance disturbance, cognitive impairment, inability to speak, numbness or tingling of both feet, numbness or tingling of both hands, seizures, slurred speech, swallowing difficulty and syncope. Overall she feels her condition is unchanged. Stroke risk factors include: hyperlipidemia, hypertension and smoking.  She denies aphasia, blurred vision, confusion, difficulty swallowing, difficulty understanding, dizziness, drowsiness, dysarthria, dysphagia, expressive aphasia, extremity weakness , facial droop, global aphasia, headache, horizontal diplopia, lethargy, loss of balance, slurred speech, vertical diplopia, vertigo and word finding difficulties.   Hypertension Patient is here for follow-up of elevated blood pressure. She is not exercising and is adherent to a low-salt diet. Cardiac symptoms: lower extremity edema. She feels like this was related to Gout flare right ankle and foot. Patient denies chest pain, chest pressure/discomfort, dyspnea, fatigue, irregular heart beat, palpitations and syncope. Cardiovascular risk factors: dyslipidemia, obesity (BMI >= 30 kg/m2), sedentary lifestyle and smoking/ tobacco exposure. Use of agents associated with hypertension: NSAIDS. History of target organ damage:  stroke.  Gout Patient here for evaluation of acute gouty arthritis. The patient reports 1 attacks involving the left ankle. Attacks occur primarily in the left ankle. Patient reports her chronic pain is improved, her joint stiffness is improved and her joint swelling is improved. Limitation on activities include difficulty with walking. The patient is not avoiding high purine foods and reports consuming 0 alcoholic drinks per months.   Past Medical History:  Diagnosis Date  . Acid reflux 08/2019  . Asthma   . Depression   . Gout   . Hypertension   . Stroke [facility])     Past Surgical History:  Procedure Laterality Date  . LOOP RECORDER INSERTION N/A 08/19/2017   Procedure: LOOP RECORDER INSERTION;  Surgeon: Hillis Range, MD;  [facility] INVASIVE CV LAB;  Service: Cardiovascular;  Laterality: N/A;  . TEE WITHOUT CARDIOVERSION N/A 08/19/2017   Procedure: TRANSESOPHAGEAL ECHOCARDIOGRAM (TEE);  Surgeon: Pricilla Riffle, MD;  Location: [facility] ENDOSCOPY;  Service: Cardiovascular;  Laterality: N/A;    Family History  Problem Relation Age of Onset  . Heart failure Mother   . Hypertension Mother   . Stroke Neg Hx     Social History   Socioeconomic History  . Marital status: Single    Spouse name: Not on file  . Number of children: Not on file  . Years of education: Not on file  . Highest education level: Not on file  Occupational History  . Not on file  Tobacco Use  . Smoking status: Current Every Day Smoker    Packs/day: 0.25    Types: Cigarettes  . Smokeless tobacco: Never Used  Substance and Sexual Activity  . Alcohol use: Yes    Comment: rare  . Drug use:  No  . Sexual activity: Yes  Other Topics Concern  . Not on file  Social History Narrative  . Not on file   Social Determinants of Health   Financial Resource Strain:   . Difficulty of Paying Living Expenses:   Food Insecurity:   . Worried About Programme researcher, broadcasting/film/video in the Last Year:   . Barista in the Last  Year:   Transportation Needs:   . Freight forwarder (Medical):   Marland Kitchen Lack of Transportation (Non-Medical):   Physical Activity:   . Days of Exercise per Week:   . Minutes of Exercise per Session:   Stress:   . Feeling of Stress :   Social Connections:   . Frequency of Communication with Friends and Family:   . Frequency of Social Gatherings with Friends and Family:   . Attends Religious Services:   . Active Member of Clubs or Organizations:   . Attends Banker Meetings:   Marland Kitchen Marital Status:   Intimate Partner Violence:   . Fear of Current or Ex-Partner:   . Emotionally Abused:   Marland Kitchen Physically Abused:   . Sexually Abused:     Outpatient Medications Prior to Visit  Medication Sig Dispense Refill  . albuterol (VENTOLIN HFA) 108 (90 Base) MCG/ACT inhaler INHALE 2 PUFFS INTO THE LUNGS EVERY 4 (FOUR) HOURS AS NEEDED FOR WHEEZING OR SHORTNESS OF BREATH. 8.5 g 11  . allopurinol (ZYLOPRIM) 100 MG tablet Take 1 tablet (100 mg total) by mouth daily. 30 tablet 0  . amLODipine (NORVASC) 10 MG tablet TAKE 1 TABLET BY MOUTH DAILY 30 tablet 6  . atorvastatin (LIPITOR) 20 MG tablet Take 1 tablet (20 mg total) by mouth daily at 6 PM. 30 tablet 3  . colchicine 0.6 MG tablet Take 1 tablet (0.6 mg total) by mouth every 8 (eight) hours as needed (for Gouty attacks). 30 tablet 2  . hydrochlorothiazide (HYDRODIURIL) 25 MG tablet TAKE 1 TABLET (25 MG TOTAL) BY MOUTH DAILY. 30 tablet 1  . ibuprofen (ADVIL,MOTRIN) 200 MG tablet Take 400 mg by mouth every 6 (six) hours as needed for mild pain.    . meloxicam (MOBIC) 15 MG tablet Take 1 tablet (15 mg total) by mouth daily. 10 tablet 0  . cyclobenzaprine (FLEXERIL) 10 MG tablet Take 0.5-1 tablets (5-10 mg total) by mouth 2 (two) times daily as needed for muscle spasms. (Patient not taking: Reported on 11/15/2019) 20 tablet 0  . famotidine (PEPCID) 20 MG tablet Take 1 tablet (20 mg total) by mouth 2 (two) times daily. (Patient not taking: Reported on  11/15/2019) 60 tablet 6   No facility-administered medications prior to visit.    No Known Allergies  ROS Review of Systems  All other systems reviewed and are negative.     Objective:    Physical Exam  Constitutional: She is oriented to person, place, and time.  Obese   HENT:  Head: Normocephalic.  Cardiovascular: Normal rate, regular rhythm, normal heart sounds and intact distal pulses.  Pulmonary/Chest: Effort normal.  Abdominal: Soft. Bowel sounds are normal.  Musculoskeletal:        General: Normal range of motion.     Cervical back: Normal range of motion.  Neurological: She is alert and oriented to person, place, and time.  Skin: Skin is warm and dry.  Psychiatric: She has a normal mood and affect. Her behavior is normal. Judgment normal.    BP 128/77 (BP Location: Right Arm, Patient Position:  Sitting)   Ht 5\' 4"  (1.626 m)   Wt 205 lb 9.6 oz (93.3 kg)   LMP 11/12/2019   SpO2 100%   BMI 35.29 kg/m  Wt Readings from Last 3 Encounters:  11/15/19 205 lb 9.6 oz (93.3 kg)  08/18/19 209 lb (94.8 kg)  03/04/19 202 lb (91.6 kg)     Health Maintenance Due  Topic Date Due  . COVID-19 Vaccine (1) Never done  . PAP SMEAR-Modifier  Never done    There are no preventive care reminders to display for this patient.  No results found for: TSH Lab Results  Component Value Date   WBC 8.6 06/10/2018   HGB 10.4 (L) 06/10/2018   HCT 34.5 (L) 06/10/2018   MCV 78.8 (L) 06/10/2018   PLT 271 06/10/2018   Lab Results  Component Value Date   NA 138 06/10/2018   K 4.2 06/10/2018   CO2 24 06/10/2018   GLUCOSE 90 06/10/2018   BUN 15 06/10/2018   CREATININE 1.46 (H) 06/10/2018   BILITOT 0.4 09/09/2017   ALKPHOS 54 09/09/2017   AST 21 09/09/2017   ALT 17 09/09/2017   PROT 7.6 09/09/2017   ALBUMIN 4.1 09/09/2017   CALCIUM 9.0 06/10/2018   ANIONGAP 9 06/10/2018   Lab Results  Component Value Date   CHOL 162 07/18/2017   Lab Results  Component Value Date   HDL  48 07/18/2017   Lab Results  Component Value Date   LDLCALC 98 07/18/2017   Lab Results  Component Value Date   TRIG 80 07/18/2017   Lab Results  Component Value Date   CHOLHDL 3.4 07/18/2017   Lab Results  Component Value Date   HGBA1C 5.0 11/15/2019   HGBA1C 5.0 11/15/2019   HGBA1C 5.0 (A) 11/15/2019   HGBA1C 5.0 11/15/2019      Assessment & Plan:   Problem List Items Addressed This Visit      Unprioritized   Essential hypertension - Primary   Relevant Orders   Comp. Metabolic Panel (12)   Lipid panel   CBC with Differential/Platelet    Other Visit Diagnoses    Screening for diabetes mellitus       A1c within normal range    Relevant Orders   HgB A1c (Completed)   POCT Glucose (Device for Home Use) (Completed)   Comp. Metabolic Panel (12)   History of CVA (cerebrovascular accident)       Under survillence   Healthcare maintenance       Relevant Orders   TSH   Vitamin B12   Magnesium   VITAMIN D 25 Hydroxy (Vit-D Deficiency, Fractures)   Ambulatory Referral to [facility]   Acute idiopathic gout, unspecified site       Relevant Orders   Uric Acid      No orders of the defined types were placed in this encounter.   Follow-up: Return in about 3 months (around 02/15/2020).    Vevelyn Francois, NP

## 2019-11-16 LAB — LIPID PANEL
Chol/HDL Ratio: 2 ratio (ref 0.0–4.4)
Cholesterol, Total: 163 mg/dL (ref 100–199)
HDL: 80 mg/dL (ref >39)
LDL Chol Calc (NIH): 67 mg/dL (ref 0–99)
Triglycerides: 84 mg/dL (ref 0–149)
VLDL Cholesterol Cal: 16 mg/dL (ref 5–40)

## 2019-11-16 LAB — COMP. METABOLIC PANEL (12)
AST: 16 IU/L (ref 0–40)
Albumin/Globulin Ratio: 1 — ABNORMAL LOW (ref 1.2–2.2)
Albumin: 3.8 g/dL (ref 3.8–4.8)
Alkaline Phosphatase: 69 IU/L (ref 39–117)
BUN/Creatinine Ratio: 10 (ref 9–23)
BUN: 11 mg/dL (ref 6–24)
Bilirubin Total: <0.2 mg/dL (ref 0.0–1.2)
Calcium: 9.3 mg/dL (ref 8.7–10.2)
Chloride: 102 mmol/L (ref 96–106)
Creatinine, Ser: 1.1 mg/dL — ABNORMAL HIGH (ref 0.57–1.00)
GFR calc Af Amer: 72 mL/min/{1.73_m2} (ref >59)
GFR calc non Af Amer: 63 mL/min/{1.73_m2} (ref >59)
Globulin, Total: 4 g/dL (ref 1.5–4.5)
Glucose: 69 mg/dL (ref 65–99)
Potassium: 3.6 mmol/L (ref 3.5–5.2)
Sodium: 140 mmol/L (ref 134–144)
Total Protein: 7.8 g/dL (ref 6.0–8.5)

## 2019-11-16 LAB — CBC WITH DIFFERENTIAL/PLATELET
Basophils Absolute: 0.1 10*3/uL (ref 0.0–0.2)
Basos: 1 %
EOS (ABSOLUTE): 0.2 10*3/uL (ref 0.0–0.4)
Eos: 4 %
Hematocrit: 34.8 % (ref 34.0–46.6)
Hemoglobin: 10.8 g/dL — ABNORMAL LOW (ref 11.1–15.9)
Immature Grans (Abs): 0 10*3/uL (ref 0.0–0.1)
Immature Granulocytes: 0 %
Lymphocytes Absolute: 1.4 10*3/uL (ref 0.7–3.1)
Lymphs: 25 %
MCH: 24.7 pg — ABNORMAL LOW (ref 26.6–33.0)
MCHC: 31 g/dL — ABNORMAL LOW (ref 31.5–35.7)
MCV: 80 fL (ref 79–97)
Monocytes Absolute: 0.5 10*3/uL (ref 0.1–0.9)
Monocytes: 8 %
Neutrophils Absolute: 3.4 10*3/uL (ref 1.4–7.0)
Neutrophils: 62 %
Platelets: 367 10*3/uL (ref 150–450)
RBC: 4.38 x10E6/uL (ref 3.77–5.28)
RDW: 14.5 % (ref 11.7–15.4)
WBC: 5.5 10*3/uL (ref 3.4–10.8)

## 2019-11-16 LAB — MAGNESIUM: Magnesium: 1.9 mg/dL (ref 1.6–2.3)

## 2019-11-16 LAB — TSH: TSH: 0.658 u[IU]/mL (ref 0.450–4.500)

## 2019-11-16 LAB — SPECIMEN STATUS REPORT

## 2019-11-16 LAB — VITAMIN B12: Vitamin B-12: 279 pg/mL (ref 232–1245)

## 2019-11-16 LAB — VITAMIN D 25 HYDROXY (VIT D DEFICIENCY, FRACTURES): Vit D, 25-Hydroxy: 6.5 ng/mL — ABNORMAL LOW (ref 30.0–100.0)

## 2019-11-16 LAB — URIC ACID: Uric Acid: 10.8 mg/dL — ABNORMAL HIGH (ref 2.6–6.2)

## 2019-11-17 ENCOUNTER — Encounter: Payer: Self-pay | Admitting: Nurse Practitioner

## 2019-11-17 ENCOUNTER — Other Ambulatory Visit: Payer: Self-pay | Admitting: Nurse Practitioner

## 2019-11-17 DIAGNOSIS — E559 Vitamin D deficiency, unspecified: Secondary | ICD-10-CM | POA: Insufficient documentation

## 2019-11-17 MED ORDER — ERGOCALCIFEROL 1.25 MG (50000 UT) PO CAPS: 50000.0000 [IU] | ORAL_CAPSULE | ORAL | 0 refills | Status: AC

## 2019-11-18 LAB — CUP PACEART REMOTE DEVICE CHECK
Date Time Interrogation Session: 20210512035906
Implantable Pulse Generator Implant Date: 20190212

## 2019-11-22 ENCOUNTER — Ambulatory Visit (INDEPENDENT_AMBULATORY_CARE_PROVIDER_SITE_OTHER): Payer: Self-pay | Admitting: *Deleted

## 2019-11-22 DIAGNOSIS — I63432 Cerebral infarction due to embolism of left posterior cerebral artery: Secondary | ICD-10-CM

## 2019-11-22 NOTE — Progress Notes (Signed)
Carelink Summary Report / Loop Recorder 

## 2019-11-23 ENCOUNTER — Telehealth: Payer: Self-pay

## 2019-11-23 NOTE — Telephone Encounter (Signed)
Telephoned patient at home number. Mailbox full and unable to leave a message with [facility] info.

## 2019-12-10 MED FILL — HYDROCHLOROTHIAZIDE 25 MG T: 25 | 30 days supply | Qty: 30 | Fill #1

## 2019-12-10 MED FILL — ?AMLODIPINE BESYL 10MG TABL: 10 | 30 days supply | Qty: 30 | Fill #3

## 2020-01-17 ENCOUNTER — Other Ambulatory Visit: Payer: Self-pay | Admitting: Nurse Practitioner

## 2020-01-17 DIAGNOSIS — I10 Essential (primary) hypertension: Secondary | ICD-10-CM

## 2020-01-17 MED FILL — HYDROCHLOROTHIAZIDE 25 MG T: 25 | 30 days supply | Qty: 30 | Fill #0

## 2020-01-17 MED FILL — ?AMLODIPINE BESYL 10MG TABL: 10 | 30 days supply | Qty: 30 | Fill #4

## 2020-02-02 MED FILL — HYDROCHLOROTHIAZIDE 25 MG T: 25 | 30 days supply | Qty: 30 | Fill #0

## 2020-02-02 MED FILL — ALBUTEROL SULFATE HFA 108 (: 108 (90 BAS | 16 days supply | Qty: 9 | Fill #0

## 2020-02-15 ENCOUNTER — Ambulatory Visit: Payer: Self-pay | Admitting: Family Medicine

## 2020-02-21 MED FILL — AMLODIPINE BESYLATE 10 MG T: 10 | 30 days supply | Qty: 30 | Fill #5

## 2020-03-16 MED FILL — HYDROCHLOROTHIAZIDE 25 MG T: 25 | 30 days supply | Qty: 30 | Fill #0

## 2020-03-16 MED FILL — AMLODIPINE BESYLATE 10 MG T: 10 | 30 days supply | Qty: 30 | Fill #6

## 2020-04-10 MED FILL — ALBUTEROL SULFATE HFA 108 (: 108 (90 BAS | 16 days supply | Qty: 9 | Fill #0

## 2020-04-26 ENCOUNTER — Other Ambulatory Visit: Payer: Self-pay | Admitting: Family Medicine

## 2020-04-26 DIAGNOSIS — I10 Essential (primary) hypertension: Secondary | ICD-10-CM

## 2020-04-26 MED FILL — HYDROCHLOROTHIAZIDE 25 MG T: 25 | 30 days supply | Qty: 30 | Fill #1

## 2020-04-26 MED FILL — ALBUTEROL SULFATE HFA 108 (: 108 (90 BAS | 16 days supply | Qty: 9 | Fill #1

## 2020-05-05 ENCOUNTER — Other Ambulatory Visit: Payer: Self-pay | Admitting: Family Medicine

## 2020-05-05 MED FILL — AMLODIPINE BESYLATE 10 MG T: 10 | 30 days supply | Qty: 30 | Fill #0

## 2020-05-12 ENCOUNTER — Ambulatory Visit: Payer: Self-pay | Admitting: Family Medicine

## 2020-05-31 ENCOUNTER — Ambulatory Visit: Payer: Self-pay | Admitting: Family Medicine

## 2020-06-05 MED FILL — HYDROCHLOROTHIAZIDE 25 MG T: 25 | 30 days supply | Qty: 30 | Fill #2

## 2020-06-08 MED FILL — AMLODIPINE BESYLATE 10 MG T: 10 | 30 days supply | Qty: 30 | Fill #0

## 2020-06-15 ENCOUNTER — Other Ambulatory Visit: Payer: Self-pay | Admitting: Family Medicine

## 2020-06-15 DIAGNOSIS — F17200 Nicotine dependence, unspecified, uncomplicated: Secondary | ICD-10-CM

## 2020-06-15 MED FILL — ALBUTEROL SULFATE HFA 108 (: 108 (90 BAS | 25 days supply | Qty: 18 | Fill #0

## 2020-06-16 ENCOUNTER — Encounter: Payer: Self-pay | Admitting: Family Medicine

## 2020-06-16 ENCOUNTER — Other Ambulatory Visit: Payer: Self-pay | Admitting: Family Medicine

## 2020-06-16 ENCOUNTER — Ambulatory Visit (INDEPENDENT_AMBULATORY_CARE_PROVIDER_SITE_OTHER): Payer: Self-pay | Admitting: Family Medicine

## 2020-06-16 ENCOUNTER — Other Ambulatory Visit: Payer: Self-pay

## 2020-06-16 VITALS — BP 134/82 | HR 74 | Temp 97.3°F | Resp 20 | Ht 64.0 in | Wt 208.8 lb

## 2020-06-16 DIAGNOSIS — Z8673 Personal history of transient ischemic attack (TIA), and cerebral infarction without residual deficits: Secondary | ICD-10-CM

## 2020-06-16 DIAGNOSIS — M10471 Other secondary gout, right ankle and foot: Secondary | ICD-10-CM

## 2020-06-16 DIAGNOSIS — M109 Gout, unspecified: Secondary | ICD-10-CM

## 2020-06-16 DIAGNOSIS — R52 Pain, unspecified: Secondary | ICD-10-CM

## 2020-06-16 DIAGNOSIS — Z09 Encounter for follow-up examination after completed treatment for conditions other than malignant neoplasm: Secondary | ICD-10-CM

## 2020-06-16 DIAGNOSIS — I10 Essential (primary) hypertension: Secondary | ICD-10-CM

## 2020-06-16 DIAGNOSIS — I639 Cerebral infarction, unspecified: Secondary | ICD-10-CM

## 2020-06-16 MED ORDER — ALLOPURINOL 100 MG PO TABS: 100.0000 mg | ORAL_TABLET | Freq: Every day | ORAL | 3 refills | Status: DC

## 2020-06-16 MED ORDER — AMLODIPINE BESYLATE 10 MG PO TABS: 10.0000 mg | ORAL_TABLET | Freq: Every day | ORAL | 3 refills | Status: DC

## 2020-06-16 MED ORDER — HYDROCHLOROTHIAZIDE 25 MG PO TABS: 25.0000 mg | ORAL_TABLET | Freq: Every day | ORAL | 3 refills | Status: DC

## 2020-06-16 MED ORDER — COLCHICINE 0.6 MG PO TABS: 0.6000 mg | ORAL_TABLET | Freq: Three times a day (TID) | ORAL | 3 refills | Status: DC | PRN

## 2020-06-16 MED FILL — ?ALLOPURINOL 100MG TABLET: 100 | 30 days supply | Qty: 30 | Fill #0

## 2020-06-16 MED FILL — ?COLCHINCINE 0.6 MG TABS: 0.6 | 30 days supply | Qty: 90 | Fill #0

## 2020-06-16 NOTE — Progress Notes (Signed)
[facility]   Established Patient Office Visit  Subjective:  Patient ID: Suzanne Graham, female    DOB: [birth year]  Age: 39 y.o. MRN: [id number]  CC:  Chief Complaint  Patient presents with  . Follow-up    HPI Suzanne Graham is a 39 year old female who presents for Follow Up.    Patient Active Problem List   Diagnosis Date Noted  . Vitamin D deficiency 11/17/2019  . 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 Current smoker 10/11/2018  . Generalized pain 10/11/2018  . Chronic nonintractable headache 08/06/2017  . Essential hypertension 08/06/2017  . Stroke [facility]) 07/17/2017    Current Status: Since her last office visit, she is doing well with no complaints. She denies fevers, chills, fatigue, recent infections, weight loss, and night sweats. She has not had any headaches, visual changes, dizziness, and falls. No chest pain, heart palpitations, cough and shortness of breath reported. Denies GI problems such as nausea, vomiting, diarrhea, and constipation. She has no reports of blood in stools, dysuria and hematuria. No depression or anxiety, and denies suicidal ideations, homicidal ideations, or auditory hallucinations. She is taking all medications as prescribed. She denies pain today.    Past Medical History:  Diagnosis Date  . Acid reflux 08/2019  . Asthma   . Depression   . Gout   . Hypertension   . Stroke [facility])     Past Surgical History:  Procedure Laterality Date  . LOOP RECORDER INSERTION N/A 08/19/2017   Procedure: LOOP RECORDER INSERTION;  Surgeon: Hillis Range, MD;  Location: [facility] INVASIVE CV LAB;  Service: Cardiovascular;  Laterality: N/A;  . TEE WITHOUT CARDIOVERSION N/A 08/19/2017   Procedure: TRANSESOPHAGEAL ECHOCARDIOGRAM (TEE);  Surgeon: Pricilla Riffle, MD;  Location: [facility] ENDOSCOPY;  Service: Cardiovascular;  Laterality: N/A;    Family History  Problem Relation Age of Onset  . Heart failure Mother   . Hypertension  Mother   . Stroke Neg Hx     Social History   Socioeconomic History  . Marital status: Single    Spouse name: Not on file  . Number of children: Not on file  . Years of education: Not on file  . Highest education level: Not on file  Occupational History  . Not on file  Tobacco Use  . Smoking status: Current Every Day Smoker    Packs/day: 0.25    Types: Cigarettes  . Smokeless tobacco: Never Used  Vaping Use  . Vaping Use: Never used  Substance and Sexual Activity  . Alcohol use: Yes    Comment: rare  . Drug use: No  . Sexual activity: Yes  Other Topics Concern  . Not on file  Social History Narrative  . Not on file   Social Determinants of Health   Financial Resource Strain: Not on file  Food Insecurity: Not on file  Transportation Needs: Not on file  Physical Activity: Not on file  Stress: Not on file  Social Connections: Not on file  Intimate Partner Violence: Not on file    Outpatient Medications Prior to Visit  Medication Sig Dispense Refill  . albuterol (VENTOLIN HFA) 108 (90 Base) MCG/ACT inhaler INHALE 2 PUFFS INTO THE LUNGS EVERY 4 (FOUR) HOURS AS NEEDED FOR WHEEZING OR SHORTNESS OF BREATH. 8.5 g 11  . ibuprofen (ADVIL,MOTRIN) 200 MG tablet Take 400 mg by mouth every 6 (six) hours as needed for mild pain.    Marland Kitchen allopurinol (ZYLOPRIM) 100 MG  tablet Take 1 tablet (100 mg total) by mouth daily. 30 tablet 0  . amLODipine (NORVASC) 10 MG tablet TAKE 1 TABLET BY MOUTH DAILY 30 tablet 6  . colchicine 0.6 MG tablet Take 1 tablet (0.6 mg total) by mouth every 8 (eight) hours as needed (for Gouty attacks). 30 tablet 2  . hydrochlorothiazide (HYDRODIURIL) 25 MG tablet TAKE 1 TABLET (25 MG TOTAL) BY MOUTH DAILY. 30 tablet 3  . atorvastatin (LIPITOR) 20 MG tablet Take 1 tablet (20 mg total) by mouth daily at 6 PM. (Patient not taking: Reported on 06/16/2020) 30 tablet 3   No facility-administered medications prior to visit.    No Known Allergies  ROS Review of  Systems  Constitutional: Negative.   HENT: Negative.   Eyes: Negative.   Respiratory: Negative.   Cardiovascular: Negative.   Gastrointestinal: Positive for abdominal distention (obese).  Endocrine: Negative.   Genitourinary: Negative.   Musculoskeletal: Positive for arthralgias (generalized ).  Skin: Negative.   Allergic/Immunologic: Negative.   Neurological: Positive for dizziness (occasional ) and headaches (occasional ).  Hematological: Negative.   Psychiatric/Behavioral: Negative.       Objective:    Physical Exam Vitals and nursing note reviewed.  Constitutional:      Appearance: Normal appearance.  HENT:     Head: Normocephalic and atraumatic.     Nose: Nose normal.     Mouth/Throat:     Mouth: Mucous membranes are moist.     Pharynx: Oropharynx is clear.  Cardiovascular:     Rate and Rhythm: Normal rate and regular rhythm.     Pulses: Normal pulses.     Heart sounds: Normal heart sounds.  Pulmonary:     Effort: Pulmonary effort is normal.     Breath sounds: Normal breath sounds.  Abdominal:     General: Bowel sounds are normal. There is distension (obese).     Palpations: Abdomen is soft.  Musculoskeletal:        General: Normal range of motion.     Cervical back: Normal range of motion and neck supple.  Skin:    General: Skin is warm and dry.  Neurological:     General: No focal deficit present.     Mental Status: She is alert and oriented to person, place, and time.  Psychiatric:        Mood and Affect: Mood normal.        Behavior: Behavior normal.        Thought Content: Thought content normal.        Judgment: Judgment normal.     BP 134/82 (BP Location: Left Arm, Patient Position: Sitting, Cuff Size: Large)   Pulse 74   Temp (!) 97.3 F (36.3 C) (Temporal)   Resp 20   Ht 5\' 4"  (1.626 m)   Wt 208 lb 12.8 oz (94.7 kg)   SpO2 100%   BMI 35.84 kg/m  Wt Readings from Last 3 Encounters:  06/16/20 208 lb 12.8 oz (94.7 kg)  11/15/19 205 lb 9.6  oz (93.3 kg)  08/18/19 209 lb (94.8 kg)     Health Maintenance Due  Topic Date Due  . Hepatitis C Screening  Never done  . PAP SMEAR-Modifier  Never done    There are no preventive care reminders to display for this patient.  Lab Results  Component Value Date   TSH 0.658 11/15/2019   Lab Results  Component Value Date   WBC 5.5 11/15/2019   HGB 10.8 (L) 11/15/2019  HCT 34.8 11/15/2019   MCV 80 11/15/2019   PLT 367 11/15/2019   Lab Results  Component Value Date   NA 140 11/15/2019   K 3.6 11/15/2019   CO2 24 06/10/2018   GLUCOSE 69 11/15/2019   BUN 11 11/15/2019   CREATININE 1.10 (H) 11/15/2019   BILITOT <0.2 11/15/2019   ALKPHOS 69 11/15/2019   AST 16 11/15/2019   ALT 17 09/09/2017   PROT 7.8 11/15/2019   ALBUMIN 3.8 11/15/2019   CALCIUM 9.3 11/15/2019   ANIONGAP 9 06/10/2018   Lab Results  Component Value Date   CHOL 163 11/15/2019   Lab Results  Component Value Date   HDL 80 11/15/2019   Lab Results  Component Value Date   LDLCALC 67 11/15/2019   Lab Results  Component Value Date   TRIG 84 11/15/2019   Lab Results  Component Value Date   CHOLHDL 2.0 11/15/2019   Lab Results  Component Value Date   HGBA1C 5.0 11/15/2019   HGBA1C 5.0 11/15/2019   HGBA1C 5.0 (A) 11/15/2019   HGBA1C 5.0 11/15/2019      Assessment & Plan:   1. History of CVA (cerebrovascular accident) Stable. No signs or symptoms of recurrence noted or reported today.   2. Essential hypertension The current medical regimen is effective; blood pressure is stable at 134/82 today; continue present plan and medications as prescribed. She will continue to take medications as prescribed, to decrease high sodium intake, excessive alcohol intake, increase potassium intake, smoking cessation, and increase physical activity of at least 30 minutes of cardio activity daily. She will continue to follow Heart Healthy or DASH diet. - amLODipine (NORVASC) 10 MG tablet; Take 1 tablet (10 mg  total) by mouth daily.  Dispense: 90 tablet; Refill: 3 - hydrochlorothiazide (HYDRODIURIL) 25 MG tablet; Take 1 tablet (25 mg total) by mouth daily.  Dispense: 90 tablet; Refill: 3  3. Generalized pain Occasional gout pain.  - colchicine 0.6 MG tablet; Take 1 tablet (0.6 mg total) by mouth every 8 (eight) hours as needed (for Gouty attacks).  Dispense: 90 tablet; Refill: 3  4. 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 Avoid foods and drinks that have a lot of purines, such as: ? Liver. ? [place] ? Tomasa Blase ? Kidney. ? Anchovies. ? Asparagus. ? Herring. ? Mushrooms ? Mussels. ? Beer  Limit alcohol intake to no more than 1 drink a day for nonpregnant women and 2 drinks a day for men. One drink equals 12 oz of beer, 5 oz of wine, or 1 oz of hard liquor.  Stay at a healthy weight or lose weight if you are overweight. If you want to lose weight, talk with your doctor. It is important that you do not lose weight too fast.  Start or continue an exercise plan as told by your doctor.  Drink enough fluids to keep your pee clear or pale yellow.  Take over-the-counter and prescription medicines only as told by your doctor.  Keep all follow-up visits as told by your doctor. This is important. - allopurinol (ZYLOPRIM) 100 MG tablet; Take 1 tablet (100 mg total) by mouth daily.  Dispense: 90 tablet; Refill: 3  5. Follow up She will follow up in 11/2020 for Annual Physical and Lab work.   Meds ordered this encounter  Medications  .  allopurinol (ZYLOPRIM) 100 MG tablet    Sig: Take 1 tablet (100 mg total) by mouth daily.    Dispense:  90 tablet    Refill:  3  . amLODipine (NORVASC) 10 MG tablet    Sig: Take 1 tablet (10 mg total) by mouth daily.    Dispense:  90 tablet    Refill:  3  . colchicine 0.6  MG tablet    Sig: Take 1 tablet (0.6 mg total) by mouth every 8 (eight) hours as needed (for Gouty attacks).    Dispense:  90 tablet    Refill:  3  . hydrochlorothiazide (HYDRODIURIL) 25 MG tablet    Sig: Take 1 tablet (25 mg total) by mouth daily.    Dispense:  90 tablet    Refill:  3    No orders of the defined types were placed in this encounter.   Referral Orders  No referral(s) requested today    Raliegh Ip, MSN, ANE, FNP-BC [facility] [address] [place], [place] [address] [phone] fax  Problem List Items Addressed This Visit      Cardiovascular and Mediastinum   Essential hypertension   Relevant Medications   amLODipine (NORVASC) 10 MG tablet   hydrochlorothiazide (HYDRODIURIL) 25 MG tablet     Other   Generalized pain   Relevant Medications   colchicine 0.6 MG tablet    Other Visit Diagnoses    History of CVA (cerebrovascular accident)    -  Primary   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 allopurinol (ZYLOPRIM) 100 MG tablet    Sig: Take 1 tablet (100 mg total) by mouth daily.    Dispense:  90 tablet    Refill:  3  . amLODipine (NORVASC) 10 MG tablet    Sig: Take 1 tablet (10 mg total) by mouth daily.    Dispense:  90 tablet    Refill:  3  . colchicine 0.6 MG tablet    Sig: Take 1 tablet (0.6 mg total) by mouth every 8 (eight) hours as needed (for Gouty attacks).    Dispense:  90 tablet    Refill:  3  . hydrochlorothiazide (HYDRODIURIL) 25 MG tablet    Sig: Take 1 tablet (25 mg total) by mouth daily.    Dispense:  90 tablet    Refill:  3    Follow-up: No follow-ups on file.    Kallie Locks, FNP

## 2020-06-16 NOTE — Progress Notes (Signed)
Follow-up, no RFs needed, due for pap

## 2020-06-17 ENCOUNTER — Encounter: Payer: Self-pay | Admitting: Family Medicine

## 2020-07-05 MED FILL — ALBUTEROL SULFATE HFA 108 (: 108 (90 BAS | 25 days supply | Qty: 18 | Fill #1

## 2020-07-14 MED FILL — AMLODIPINE BESYLATE 10 MG T: 10 | 30 days supply | Qty: 30 | Fill #1

## 2020-07-14 MED FILL — HYDROCHLOROTHIAZIDE 25 MG T: 25 | 30 days supply | Qty: 30 | Fill #3

## 2020-07-25 ENCOUNTER — Emergency Department (HOSPITAL_COMMUNITY)
Admission: EM | Admit: 2020-07-25 | Discharge: 2020-07-25 | Disposition: A | Discharge status: Home / Self Care | Payer: Self-pay | Attending: Emergency Medicine | Admitting: Emergency Medicine

## 2020-07-25 ENCOUNTER — Other Ambulatory Visit (HOSPITAL_COMMUNITY): Payer: Self-pay | Admitting: Physician Assistant

## 2020-07-25 ENCOUNTER — Encounter (HOSPITAL_COMMUNITY): Payer: Self-pay | Admitting: Emergency Medicine

## 2020-07-25 ENCOUNTER — Emergency Department (HOSPITAL_COMMUNITY): Payer: Self-pay

## 2020-07-25 DIAGNOSIS — Z79899 Other long term (current) drug therapy: Secondary | ICD-10-CM | POA: Insufficient documentation

## 2020-07-25 DIAGNOSIS — F17210 Nicotine dependence, cigarettes, uncomplicated: Secondary | ICD-10-CM | POA: Insufficient documentation

## 2020-07-25 DIAGNOSIS — I10 Essential (primary) hypertension: Secondary | ICD-10-CM | POA: Insufficient documentation

## 2020-07-25 DIAGNOSIS — J45909 Unspecified asthma, uncomplicated: Secondary | ICD-10-CM | POA: Insufficient documentation

## 2020-07-25 DIAGNOSIS — W19XXXA Unspecified fall, initial encounter: Secondary | ICD-10-CM

## 2020-07-25 DIAGNOSIS — W010XXA Fall on same level from slipping, tripping and stumbling without subsequent striking against object, initial encounter: Secondary | ICD-10-CM | POA: Insufficient documentation

## 2020-07-25 DIAGNOSIS — M25562 Pain in left knee: Secondary | ICD-10-CM | POA: Insufficient documentation

## 2020-07-25 MED ORDER — IBUPROFEN 600 MG PO TABS: 600.0000 mg | ORAL_TABLET | Freq: Four times a day (QID) | ORAL | 0 refills | Status: DC | PRN

## 2020-07-25 MED FILL — IBUPROFEN 600 MG TABLET: 600 | 7 days supply | Qty: 30 | Fill #0

## 2020-07-25 NOTE — ED Provider Notes (Signed)
[facility] EMERGENCY DEPARTMENT Provider Note   CSN: [id number] Arrival date & time: 07/25/20  1029     History No chief complaint on file.   Suzanne Graham is a 40 y.o. female with no relevant past medical history presents to the ED with complaints of left knee pain after fall.  Patient reports that she tripped over her dog 2 days ago and landed directly on her left patella.  She states that she has pain with extension of her left knee.  She denies any numbness or weakness.  She also endorses swelling and redness involving her second toe on left foot and is concerned that her gout is acting up.  She states that she had been taking her allopurinol, as directed.  She has tried treating her symptoms with Tylenol, with little improvement.  She was hoping that she can wear her cam walker from a previous injury and use crutches, but was having difficulty and wanted to come to the ED for evaluation.  She denies any fevers, chills, or other injuries.  Patient denies possibility of pregnancy.  Patient states that she has been weight-bearing.    HPI     Past Medical History:  Diagnosis Date  . Acid reflux 08/2019  . Asthma   . Depression   . Gout   . Hypertension   . Stroke [facility])     Patient Active Problem List   Diagnosis Date Noted  . Vitamin D deficiency 11/17/2019  . 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 Current smoker 10/11/2018  . Generalized pain 10/11/2018  . Chronic nonintractable headache 08/06/2017  . Essential hypertension 08/06/2017  . Stroke [facility]) 07/17/2017    Past Surgical History:  Procedure Laterality Date  . LOOP RECORDER INSERTION N/A 08/19/2017   Procedure: LOOP RECORDER INSERTION;  Surgeon: Hillis Range, MD;  Location: [facility] INVASIVE CV LAB;  Service: Cardiovascular;  Laterality: N/A;  . TEE WITHOUT CARDIOVERSION N/A 08/19/2017   Procedure: TRANSESOPHAGEAL ECHOCARDIOGRAM (TEE);  Surgeon: Pricilla Riffle, MD;  Location: [facility] ENDOSCOPY;  Service:  Cardiovascular;  Laterality: N/A;     OB History    Gravida  0   Para  0   Term  0   Preterm  0   AB  0   Living  0     SAB  0   IAB  0   Ectopic  0   Multiple  0   Live Births  0           Family History  Problem Relation Age of Onset  . Heart failure Mother   . Hypertension Mother   . Stroke Neg Hx     Social History   Tobacco Use  . Smoking status: Current Every Day Smoker    Packs/day: 0.25    Types: Cigarettes  . Smokeless tobacco: Never Used  Vaping Use  . Vaping Use: Never used  Substance Use Topics  . Alcohol use: Yes    Comment: rare  . Drug use: No    Home Medications Prior to Admission medications   Medication Sig Start Date End Date Taking? Authorizing Provider  ibuprofen (ADVIL) 600 MG tablet Take 1 tablet (600 mg total) by mouth every 6 (six) hours as needed. 07/25/20  Yes Lorelee New, PA-C  albuterol (VENTOLIN HFA) 108 (90 Base) MCG/ACT inhaler INHALE 2 PUFFS INTO THE LUNGS EVERY 4 (FOUR) HOURS AS NEEDED FOR WHEEZING OR SHORTNESS OF BREATH. 06/15/20   Kallie Locks, FNP  allopurinol (ZYLOPRIM) 100 MG tablet Take 1 tablet (100 mg total) by mouth daily. 06/16/20 06/11/21  Kallie Locks, FNP  amLODipine (NORVASC) 10 MG tablet Take 1 tablet (10 mg total) by mouth daily. 06/16/20   Kallie Locks, FNP  colchicine 0.6 MG tablet Take 1 tablet (0.6 mg total) by mouth every 8 (eight) hours as needed (for Gouty attacks). 06/16/20 06/16/21  Kallie Locks, FNP  hydrochlorothiazide (HYDRODIURIL) 25 MG tablet Take 1 tablet (25 mg total) by mouth daily. 06/16/20   Kallie Locks, FNP    Allergies    Patient has no known allergies.  Review of Systems   Review of Systems  All other systems reviewed and are negative.   Physical Exam Updated Vital Signs BP (!) 138/101   Pulse (!) 102   Temp 98.8 F (37.1 C) (Oral)   Resp 18   Ht 5\' 4"  (1.626 m)   Wt 91.2 kg   LMP 07/10/2020   SpO2 100%   BMI 34.50 kg/m   Physical  Exam Vitals and nursing note reviewed. Exam conducted with a chaperone present.  Constitutional:      Appearance: Normal appearance.  HENT:     Head: Normocephalic and atraumatic.  Eyes:     General: No scleral icterus.    Conjunctiva/sclera: Conjunctivae normal.  Cardiovascular:     Rate and Rhythm: Normal rate.     Pulses: Normal pulses.  Pulmonary:     Effort: Pulmonary effort is normal. No respiratory distress.  Musculoskeletal:     Comments: Left knee: Flexion and extension mildly limited due to swelling and pain.  No significant focal areas of tenderness.  Effusion noted, but no overlying ecchymoses or other skin changes.  No significant warmth.  No obvious deformity. Left hip: Hip flexion with strength intact.  No tenderness. Left ankle: No tenderness.  ROM and strength normal.  Pedal pulse intact and symmetric with contralateral foot. Left foot, second toe: Mild erythema and swelling relative to other toes and toe on contralateral foot.  Sensitivity with light touch.  Capillary refill less than 2 seconds.   Skin:    General: Skin is dry.  Neurological:     General: No focal deficit present.     Mental Status: She is alert and oriented to person, place, and time.     GCS: GCS eye subscore is 4. GCS verbal subscore is 5. GCS motor subscore is 6.     Cranial Nerves: No cranial nerve deficit.     Sensory: No sensory deficit.     Coordination: Coordination normal.  Psychiatric:        Mood and Affect: Mood normal.        Behavior: Behavior normal.        Thought Content: Thought content normal.     ED Results / Procedures / Treatments   Labs (all labs ordered are listed, but only abnormal results are displayed) Labs Reviewed - No data to display  EKG None  Radiology DG Knee Complete 4 Views Left  Result Date: 07/25/2020 CLINICAL DATA:  Fall.  Pain. EXAM: LEFT KNEE - COMPLETE 4+ VIEW COMPARISON:  None. FINDINGS: Moderate suprapatellar joint effusion. 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 Medial tibial plateau irregularity is favored to be degenerative. IMPRESSION: Moderate suprapatellar joint effusion. 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 Medial tibial plateau osseous irregularity is favored to be degenerative. Given the size of the joint effusion, consider CT to exclude otherwise occult fracture. Electronically Signed  By: Jeronimo Greaves M.D.   On: 07/25/2020 11:26    Procedures Procedures (including critical care time)  Medications Ordered in ED Medications - No data to display  ED Course  I have reviewed the triage vital signs and the nursing notes.  Pertinent labs & imaging results that were available during my care of the patient were reviewed by me and considered in my medical decision making (see chart for details).    MDM Rules/Calculators/A&P                          Patient is resting comfortably and in no acute distress on my examination.  Plain films obtained of her left knee are personally reviewed which demonstrate moderate suprapatellar joint effusion.  There is also a medial tibial plateau osseous irregularity, but favored to be degenerative as she has aged advanced tricompartment osteoarthritis.  However, given the size of joint effusion, Dr. Reche Dixon recommended consideration of CT to exclude possible occult fracture.  Patient states that she has been weightbearing since her injury.  She has lower suspicion for fracture.  I offered CT given findings on x-ray, but patient declined.  She would prefer outpatient follow-up with Dr. Aundria Rud with [facility].  I will provide the referral information.  In the interim, encouraging her to wear her knee immobilizer and use crutches.  I encouraged her to remain nonweightbearing until she is evaluated on outpatient basis by orthopedics.  Will prescribe patient ibuprofen 600 mg every 6 hours as needed for inflammation, pain, and to also cover her for possible gouty  arthritis involving left second toe.  Encouraging her to follow-up with her primary care provider regarding today's encounter.  ED return precautions discussed.  Patient voices understanding and is agreeable to the plan.  Final Clinical Impression(s) / ED Diagnoses Final diagnoses:  Fall, initial encounter  Acute pain of left knee    Rx / DC Orders ED Discharge Orders         Ordered    ibuprofen (ADVIL) 600 MG tablet  Every 6 hours PRN        07/25/20 1241           Lorelee New, PA-C 07/25/20 1255    Horton, Clabe Seal, DO 07/26/20 [phone]

## 2020-07-25 NOTE — Progress Notes (Signed)
Orthopedic Tech Progress Note Patient Details:  Suzanne Graham 09-Jun-1981 [id number]  Ortho Devices Type of Ortho Device: Knee Immobilizer Ortho Device/Splint Location: LLE Ortho Device/Splint Interventions: Ordered,Application,Adjustment   Post Interventions Patient Tolerated: Well Instructions Provided: Care of device   Donald Pore 07/25/2020, 1:04 PM

## 2020-07-25 NOTE — Discharge Instructions (Addendum)
Your x-ray was without any obvious fracture, but could not be definitively excluded.  Given that you are declining CT here in the ED today, I would like for you to follow-up with orthopedics for ongoing evaluation and management.  Please read the attachments.  Rest, Ice, Compression, and Elevation of that leg!  Please continue to use the knee immobilizer and remain nonweightbearing with crutches until you are evaluated by orthopedics.  I have referred you to Dr. Aundria Rud at [facility].  Please call the office to schedule an appointment.  Please take the ibuprofen 600 mg every 6 hours as needed for pain control and swelling.  This would also help with your possible developing gouty arthritis involving second toe left foot.  Please also follow-up with your primary care provider regarding today's encounter.  Return to the ED or seek immediate medical attention should you experience any new or worsening symptoms.

## 2020-07-25 NOTE — ED Notes (Signed)
Patient verbalizes understanding of discharge instructions. Opportunity for questioning and answers were provided. Armband removed by staff, pt discharged from ED.  

## 2020-07-25 NOTE — ED Triage Notes (Signed)
Pt here with c/o left knee pain after a fall 2 days ago , knee is slightly swollen

## 2020-07-27 MED FILL — ALBUTEROL SULFATE HFA 108 (: 108 (90 BAS | 25 days supply | Qty: 18 | Fill #2

## 2020-08-16 ENCOUNTER — Other Ambulatory Visit (HOSPITAL_COMMUNITY): Payer: Self-pay | Admitting: Physician Assistant

## 2020-08-16 ENCOUNTER — Other Ambulatory Visit: Payer: Self-pay

## 2020-08-16 ENCOUNTER — Emergency Department (HOSPITAL_COMMUNITY)
Admission: EM | Admit: 2020-08-16 | Discharge: 2020-08-16 | Disposition: A | Discharge status: Home / Self Care | Payer: Self-pay | Attending: Emergency Medicine | Admitting: Emergency Medicine

## 2020-08-16 ENCOUNTER — Emergency Department (HOSPITAL_COMMUNITY): Payer: Self-pay

## 2020-08-16 ENCOUNTER — Encounter (HOSPITAL_COMMUNITY): Payer: Self-pay | Admitting: Emergency Medicine

## 2020-08-16 DIAGNOSIS — M109 Gout, unspecified: Secondary | ICD-10-CM | POA: Insufficient documentation

## 2020-08-16 DIAGNOSIS — J45909 Unspecified asthma, uncomplicated: Secondary | ICD-10-CM | POA: Insufficient documentation

## 2020-08-16 DIAGNOSIS — E876 Hypokalemia: Secondary | ICD-10-CM | POA: Insufficient documentation

## 2020-08-16 DIAGNOSIS — F17210 Nicotine dependence, cigarettes, uncomplicated: Secondary | ICD-10-CM | POA: Insufficient documentation

## 2020-08-16 DIAGNOSIS — I10 Essential (primary) hypertension: Secondary | ICD-10-CM | POA: Insufficient documentation

## 2020-08-16 DIAGNOSIS — Z79899 Other long term (current) drug therapy: Secondary | ICD-10-CM | POA: Insufficient documentation

## 2020-08-16 LAB — CBC WITH DIFFERENTIAL/PLATELET
Abs Immature Granulocytes: 0.05 10*3/uL (ref 0.00–0.07)
Basophils Absolute: 0 10*3/uL (ref 0.0–0.1)
Basophils Relative: 0 %
Eosinophils Absolute: 0.1 10*3/uL (ref 0.0–0.5)
Eosinophils Relative: 1 %
HCT: 34 % — ABNORMAL LOW (ref 36.0–46.0)
Hemoglobin: 11 g/dL — ABNORMAL LOW (ref 12.0–15.0)
Immature Granulocytes: 1 %
Lymphocytes Relative: 9 %
Lymphs Abs: 0.7 10*3/uL (ref 0.7–4.0)
MCH: 24.8 pg — ABNORMAL LOW (ref 26.0–34.0)
MCHC: 32.4 g/dL (ref 30.0–36.0)
MCV: 76.7 fL — ABNORMAL LOW (ref 80.0–100.0)
Monocytes Absolute: 0.5 10*3/uL (ref 0.1–1.0)
Monocytes Relative: 7 %
Neutro Abs: 6.3 10*3/uL (ref 1.7–7.7)
Neutrophils Relative %: 82 %
Platelets: 313 10*3/uL (ref 150–400)
RBC: 4.43 MIL/uL (ref 3.87–5.11)
RDW: 14.1 % (ref 11.5–15.5)
WBC: 7.7 10*3/uL (ref 4.0–10.5)
nRBC: 0 % (ref 0.0–0.2)

## 2020-08-16 LAB — I-STAT BETA HCG BLOOD, ED (MC, WL, AP ONLY): I-stat hCG, quantitative: <5 m[IU]/mL (ref <5)

## 2020-08-16 LAB — BASIC METABOLIC PANEL
Anion gap: 15 (ref 5–15)
BUN: 11 mg/dL (ref 6–20)
CO2: 23 mmol/L (ref 22–32)
Calcium: 9.2 mg/dL (ref 8.9–10.3)
Chloride: 100 mmol/L (ref 98–111)
Creatinine, Ser: 1.28 mg/dL — ABNORMAL HIGH (ref 0.44–1.00)
GFR, Estimated: 54 mL/min — ABNORMAL LOW (ref >60)
Glucose, Bld: 94 mg/dL (ref 70–99)
Potassium: 3.2 mmol/L — ABNORMAL LOW (ref 3.5–5.1)
Sodium: 138 mmol/L (ref 135–145)

## 2020-08-16 MED ORDER — PREDNISONE 20 MG PO TABS
60.0000 mg | ORAL_TABLET | Freq: Once | ORAL | Status: AC
  Administered 2020-08-16: 60 mg via ORAL
  Filled 2020-08-16: qty 3

## 2020-08-16 MED ORDER — OXYCODONE-ACETAMINOPHEN 5-325 MG PO TABS: 1.0000 | ORAL_TABLET | ORAL | 0 refills | Status: DC | PRN

## 2020-08-16 MED ORDER — OXYCODONE HCL 5 MG PO TABS
5.0000 mg | ORAL_TABLET | Freq: Once | ORAL | Status: AC
  Administered 2020-08-16: 5 mg via ORAL
  Filled 2020-08-16: qty 1

## 2020-08-16 MED ORDER — PREDNISONE 20 MG PO TABS: ORAL_TABLET | ORAL | 0 refills | Status: DC

## 2020-08-16 MED ORDER — OXYCODONE-ACETAMINOPHEN 5-325 MG PO TABS
1.0000 | ORAL_TABLET | Freq: Once | ORAL | Status: AC
  Administered 2020-08-16: 1 via ORAL
  Filled 2020-08-16: qty 1

## 2020-08-16 MED ORDER — POTASSIUM CHLORIDE CRYS ER 20 MEQ PO TBCR: 20.0000 meq | EXTENDED_RELEASE_TABLET | Freq: Every day | ORAL | 0 refills | Status: DC

## 2020-08-16 MED ORDER — POTASSIUM CHLORIDE CRYS ER 20 MEQ PO TBCR
40.0000 meq | EXTENDED_RELEASE_TABLET | Freq: Once | ORAL | Status: AC
  Administered 2020-08-16: 40 meq via ORAL
  Filled 2020-08-16: qty 2

## 2020-08-16 MED FILL — COLCHICINE 0.6 MG TABS: 0.6 | 30 days supply | Qty: 90 | Fill #0

## 2020-08-16 NOTE — Discharge Instructions (Addendum)
1. Medications: klor-con, percocet, prednisone, usual home medications 2. Treatment: rest, drink plenty of fluids,  3. Follow Up: Please followup with your primary doctor in 3-5 days for discussion of your diagnoses and further evaluation after today's visit; if you do not have a primary care doctor use the resource guide provided to find one; Please return to the ER for fevers, worsening pain, difficulty breathing or other concerns

## 2020-08-16 NOTE — ED Triage Notes (Signed)
Patient reports gout flare up at right foot with swelling unrelieved by OTC pain medications .

## 2020-08-16 NOTE — ED Provider Notes (Signed)
[facility] EMERGENCY DEPARTMENT Provider Note   CSN: [id number] Arrival date & time: 08/16/20  1837     History Chief Complaint  Patient presents with  . Gout Flare Up    Suzanne Graham is a 40 y.o. female with a hx of gout presents to the Emergency Department complaining of gradual, persistent, progressively worsening gout flare onset 3 days ago. Associated symptoms include swelling and pain in the forefoot and great toe.  Pt reports ssx today are consistent with previous gout flares and she experiences them every 3-4 months.  She reports she often takes Indomethacin for acute flares.  She reports Rx for allopurinol but has not been taking this recently.  Denies recent injury to the foot, fevers, chills, open wounds. Walking, movement and palpation make it worse, nothing makes it better.  Pt reports taking ibuprofen at home without relief.    The history is provided by the patient and medical records. No language interpreter was used.       Past Medical History:  Diagnosis Date  . Acid reflux 08/2019  . Asthma   . Depression   . Gout   . Hypertension   . Stroke [facility])     Patient Active Problem List   Diagnosis Date Noted  . Vitamin D deficiency 11/17/2019  . 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 Current smoker 10/11/2018  . Generalized pain 10/11/2018  . Chronic nonintractable headache 08/06/2017  . Essential hypertension 08/06/2017  . Stroke [facility]) 07/17/2017    Past Surgical History:  Procedure Laterality Date  . LOOP RECORDER INSERTION N/A 08/19/2017   Procedure: LOOP RECORDER INSERTION;  Surgeon: Hillis Range, MD;  Location: [facility] INVASIVE CV LAB;  Service: Cardiovascular;  Laterality: N/A;  . TEE WITHOUT CARDIOVERSION N/A 08/19/2017   Procedure: TRANSESOPHAGEAL ECHOCARDIOGRAM (TEE);  Surgeon: Pricilla Riffle, MD;  Location[facility] ENDOSCOPY;  Service: Cardiovascular;  Laterality: N/A;     OB History    Gravida  0   Para  0   Term  0   Preterm  0    AB  0   Living  0     SAB  0   IAB  0   Ectopic  0   Multiple  0   Live Births  0           Family History  Problem Relation Age of Onset  . Heart failure Mother   . Hypertension Mother   . Stroke Neg Hx     Social History   Tobacco Use  . Smoking status: Current Every Day Smoker    Packs/day: 0.25    Types: Cigarettes  . Smokeless tobacco: Never Used  Vaping Use  . Vaping Use: Never used  Substance Use Topics  . Alcohol use: Yes    Comment: rare  . Drug use: No    Home Medications Prior to Admission medications   Medication Sig Start Date End Date Taking? Authorizing Provider  oxyCODONE-acetaminophen (PERCOCET) 5-325 MG tablet Take 1 tablet by mouth every 4 (four) hours as needed. 08/16/20  Yes Quanta Roher, Dahlia Client, PA-C  potassium chloride SA (KLOR-CON) 20 MEQ tablet Take 1 tablet (20 mEq total) by mouth daily. 08/16/20  Yes Chi Woodham, Dahlia Client, PA-C  predniSONE (DELTASONE) 20 MG tablet 3 tabs po daily x 3 days, then 2 tabs x 3 days, then 1.5 tabs x 3 days, then 1 tab x 3 days, then 0.5 tabs x 3 days 08/16/20  Yes Cullan Launer, Dahlia Client, PA-C  albuterol (  VENTOLIN HFA) 108 (90 Base) MCG/ACT inhaler INHALE 2 PUFFS INTO THE LUNGS EVERY 4 (FOUR) HOURS AS NEEDED FOR WHEEZING OR SHORTNESS OF BREATH. 06/15/20   Kallie Locks, FNP  allopurinol (ZYLOPRIM) 100 MG tablet Take 1 tablet (100 mg total) by mouth daily. 06/16/20 06/11/21  Kallie Locks, FNP  amLODipine (NORVASC) 10 MG tablet Take 1 tablet (10 mg total) by mouth daily. 06/16/20   Kallie Locks, FNP  colchicine 0.6 MG tablet Take 1 tablet (0.6 mg total) by mouth every 8 (eight) hours as needed (for Gouty attacks). 06/16/20 06/16/21  Kallie Locks, FNP  hydrochlorothiazide (HYDRODIURIL) 25 MG tablet Take 1 tablet (25 mg total) by mouth daily. 06/16/20   Kallie Locks, FNP  ibuprofen (ADVIL) 600 MG tablet Take 1 tablet (600 mg total) by mouth every 6 (six) hours as needed. 07/25/20   Lorelee New,  PA-C    Allergies    Patient has no known allergies.  Review of Systems   Review of Systems  Constitutional: Negative for appetite change, diaphoresis, fatigue, fever and unexpected weight change.  HENT: Negative for mouth sores.   Eyes: Negative for visual disturbance.  Respiratory: Negative for cough, chest tightness, shortness of breath and wheezing.   Cardiovascular: Negative for chest pain.  Gastrointestinal: Negative for abdominal pain, constipation, diarrhea, nausea and vomiting.  Endocrine: Negative for polydipsia, polyphagia and polyuria.  Genitourinary: Negative for dysuria, frequency, hematuria and urgency.  Musculoskeletal: Positive for joint swelling and myalgias. Negative for back pain and neck stiffness.  Skin: Positive for color change ( red). Negative for rash.  Allergic/Immunologic: Negative for immunocompromised state.  Neurological: Negative for syncope, light-headedness and headaches.  Hematological: Does not bruise/bleed easily.  Psychiatric/Behavioral: Negative for sleep disturbance. The patient is not nervous/anxious.     Physical Exam Updated Vital Signs BP 121/90 (BP Location: Right Arm)   Pulse 87   Temp 98.5 F (36.9 C) (Oral)   Resp 16   Ht 5\' 7"  (1.702 m)   Wt 122 kg   LMP 08/15/2020   SpO2 95%   BMI 42.13 kg/m   Physical Exam Vitals and nursing note reviewed.  Constitutional:      General: She is not in acute distress.    Appearance: She is well-developed and well-nourished.  HENT:     Head: Normocephalic.  Eyes:     General: No scleral icterus.    Conjunctiva/sclera: Conjunctivae normal.  Cardiovascular:     Rate and Rhythm: Normal rate.     Pulses: Intact distal pulses.          Dorsalis pedis pulses are 2+ on the right side.       Posterior tibial pulses are 2+ on the right side.     Comments: Capillary refill <3 sec in the right foot Pulmonary:     Effort: Pulmonary effort is normal.  Musculoskeletal:     Cervical back:  Normal range of motion.     Right foot: Decreased range of motion. Swelling and tenderness present.     Comments: Swelling, redness to the MTP of the great toe Swelling and tenderness to the forefoot   Skin:    General: Skin is warm and dry.     Comments: No open wounds to the right foot  Neurological:     Mental Status: She is alert.     Comments: Sensation intact to the RLE Ambulatory with pain     ED Results / Procedures / Treatments  Labs (all labs ordered are listed, but only abnormal results are displayed) Labs Reviewed  CBC WITH DIFFERENTIAL/PLATELET - Abnormal; Notable for the following components:      Result Value   Hemoglobin 11.0 (*)    HCT 34.0 (*)    MCV 76.7 (*)    MCH 24.8 (*)    All other components within normal limits  BASIC METABOLIC PANEL - Abnormal; Notable for the following components:   Potassium 3.2 (*)    Creatinine, Ser 1.28 (*)    GFR, Estimated 54 (*)    All other components within normal limits  I-STAT BETA HCG BLOOD, ED ([facility], [facility], [facility] ONLY)     Radiology DG Foot Complete Right  Result Date: 08/16/2020 CLINICAL DATA:  Pain, swelling, history of gout EXAM: RIGHT FOOT COMPLETE - 3+ VIEW COMPARISON:  None. FINDINGS: Diffuse soft tissue swelling of the lower extremity predominantly around the fore and midfoot. No focal gouty calcifications are seen. Questionable erosion along the medial base of the first proximal phalanx. Finding is however on a background of hallux valgus deformity of the first ray with bunion formation. Additional note is made of a pes planus deformity. Additional degenerative changes noted throughout the foot and imaged ankle including focal spurring seen along the dorsal aspect of the navicular. Bidirectional calcaneal spurs are noted as well. IMPRESSION: 1. Diffuse soft tissue swelling of the lower extremity predominantly around the fore and midfoot. 2. Questionable erosion along the medial base of the first proximal phalanx could be  seen in the setting of gout. Finding is however on a background of hallux valgus deformity with bunion formation. 3. Pes planus deformity. Electronically Signed   By: Kreg Shropshire M.D.   On: 08/16/2020 20:08    Procedures Procedures   Medications Ordered in ED Medications  oxyCODONE-acetaminophen (PERCOCET/ROXICET) 5-325 MG per tablet 1 tablet (1 tablet Oral Given 08/16/20 1939)  oxyCODONE (Oxy IR/ROXICODONE) immediate release tablet 5 mg (5 mg Oral Given 08/16/20 2310)  potassium chloride SA (KLOR-CON) CR tablet 40 mEq (40 mEq Oral Given 08/16/20 2310)  predniSONE (DELTASONE) tablet 60 mg (60 mg Oral Given 08/16/20 2310)    ED Course  I have reviewed the triage vital signs and the nursing notes.  Pertinent labs & imaging results that were available during my care of the patient were reviewed by me and considered in my medical decision making (see chart for details).  Clinical Course as of 08/17/20 0058  Wed Aug 16, 2020  2304 Hemoglobin(!): 11.0 baseline [HM]  2304 Creatinine(!): 1.28 baseline [HM]  2304 Potassium(!): 3.2 Hypokalemia - replaced in the ED and Rx for home [HM]  2305 DG Foot Complete Right No fracture [HM]    Clinical Course User Index [HM] Cashae Weich, Boyd Kerbs   MDM Rules/Calculators/A&P                          Pt presents with monoarticular pain, swelling of the foot and erythema.  Pt is afebrile and stable. Imaging reviewed, no evidence of occult fracture or injury.  Personally evaluated these images.  Renal function slightly diminished. Pt without known peptic ulcer disease and not receiving concurrent treatment on warfarin, however given mild CKD patient given prednisone instead of indomethacin. Discussed that pt should respond to treatment with in 24 hour of begining treatment & likely resolve in 2-3 days.  Encourage close follow-up with primary care.  Patient also found to be mildly hypokalemic.  Replaced here in  the emergency department and prescription  written for home.  Questions answered.  Patient states understanding and is in agreement with the plan.   Final Clinical Impression(s) / ED Diagnoses Final diagnoses:  Acute gout of right foot, unspecified cause  Hypokalemia    Rx / DC Orders ED Discharge Orders         Ordered    oxyCODONE-acetaminophen (PERCOCET) 5-325 MG tablet  Every 4 hours PRN        08/16/20 2308    predniSONE (DELTASONE) 20 MG tablet        08/16/20 2308    potassium chloride SA (KLOR-CON) 20 MEQ tablet  Daily        08/16/20 2308           Dierdre Forth, PA-C 08/17/20 0058    Pollyann Savoy, MD 08/17/20 1459

## 2020-08-17 ENCOUNTER — Telehealth: Payer: Self-pay | Admitting: *Deleted

## 2020-08-17 MED FILL — predniSONE 20 MG TABS: 20 | 15 days supply | Qty: 27 | Fill #0

## 2020-08-17 MED FILL — POTASSIUM CL ER 20 MEQ TAB: 20 | 3 days supply | Qty: 3 | Fill #0

## 2020-08-17 NOTE — Telephone Encounter (Signed)
Pt called regarding Rx location.  RNCM reviewed chart to find Rx was sent to [facility] and they do not fill narcotic Rx.  RNCM sent message to prescribing EDP to reroute to [facility] on [address] as requested by patient.

## 2020-08-25 MED FILL — AMLODIPINE BESYLATE 10 MG T: 10 | 30 days supply | Qty: 30 | Fill #2

## 2020-08-25 MED FILL — HYDROCHLOROTHIAZIDE 25 MG T: 25 | 30 days supply | Qty: 30 | Fill #0

## 2020-09-13 ENCOUNTER — Telehealth: Payer: Self-pay

## 2020-09-14 NOTE — Telephone Encounter (Signed)
I ordered the patient a new monitor. She should receive it in 7-10 business days. 

## 2020-09-26 MED FILL — !VENTOLIN HFA INHALER: 108 (90 BAS | 25 days supply | Qty: 18 | Fill #3

## 2020-09-27 ENCOUNTER — Encounter (HOSPITAL_COMMUNITY): Payer: Self-pay

## 2020-09-27 ENCOUNTER — Other Ambulatory Visit: Payer: Self-pay

## 2020-09-27 ENCOUNTER — Emergency Department (HOSPITAL_COMMUNITY): Payer: Self-pay

## 2020-09-27 ENCOUNTER — Emergency Department (HOSPITAL_COMMUNITY)
Admission: EM | Admit: 2020-09-27 | Discharge: 2020-09-27 | Disposition: A | Discharge status: Home / Self Care | Payer: Self-pay | Attending: Emergency Medicine | Admitting: Emergency Medicine

## 2020-09-27 ENCOUNTER — Other Ambulatory Visit (HOSPITAL_COMMUNITY): Payer: Self-pay | Admitting: Emergency Medicine

## 2020-09-27 DIAGNOSIS — T148XXA Other injury of unspecified body region, initial encounter: Secondary | ICD-10-CM

## 2020-09-27 DIAGNOSIS — X58XXXA Exposure to other specified factors, initial encounter: Secondary | ICD-10-CM | POA: Insufficient documentation

## 2020-09-27 DIAGNOSIS — F17210 Nicotine dependence, cigarettes, uncomplicated: Secondary | ICD-10-CM | POA: Insufficient documentation

## 2020-09-27 DIAGNOSIS — I10 Essential (primary) hypertension: Secondary | ICD-10-CM | POA: Insufficient documentation

## 2020-09-27 DIAGNOSIS — J45909 Unspecified asthma, uncomplicated: Secondary | ICD-10-CM | POA: Insufficient documentation

## 2020-09-27 DIAGNOSIS — M1712 Unilateral primary osteoarthritis, left knee: Secondary | ICD-10-CM | POA: Insufficient documentation

## 2020-09-27 DIAGNOSIS — Z79899 Other long term (current) drug therapy: Secondary | ICD-10-CM | POA: Insufficient documentation

## 2020-09-27 DIAGNOSIS — S86912A Strain of unspecified muscle(s) and tendon(s) at lower leg level, left leg, initial encounter: Secondary | ICD-10-CM | POA: Insufficient documentation

## 2020-09-27 MED ORDER — ACETAMINOPHEN 500 MG PO TABS
1000.0000 mg | ORAL_TABLET | Freq: Once | ORAL | Status: AC
  Administered 2020-09-27: 1000 mg via ORAL
  Filled 2020-09-27: qty 2

## 2020-09-27 MED ORDER — METHOCARBAMOL 500 MG PO TABS: 500.0000 mg | ORAL_TABLET | Freq: Two times a day (BID) | ORAL | 0 refills | Status: DC

## 2020-09-27 MED FILL — METHOCARBAMOL 500 MG TABS: 500 | 10 days supply | Qty: 20 | Fill #0

## 2020-09-27 NOTE — ED Provider Notes (Signed)
[facility] EMERGENCY DEPARTMENT Provider Note   CSN: [id number] Arrival date & time: 09/27/20  0845     History No chief complaint on file.   Suzanne Graham is a 40 y.o. female.  HPI Patient is a 40 year old female with past medical history significant for morbid obesity, chronic gout of the foot, HTN, depression  Patient is presenting today with several months of left knee pain.  She specifies that is been hurting since she was last seen in the ER after she fell when she was pulled over by her dog.  She had an x-ray at that time that she states showed a fracture and she was discharged with follow-up information for an orthopedic doctor however she lost the paperwork and does not know to follow-up with.  She states that she tried taking ibuprofen but that it did not help the pain.  She has not taken any Tylenol or any other medications.  She denies any new trauma.  States that the pain has never gone completely away but seems improved from prior.  Denies any other associate symptoms.  No fevers chills rashes vaginal discharge or other associate symptoms.  Worse with long peers of walking.  Better with elevation.    Past Medical History:  Diagnosis Date  . Acid reflux 08/2019  . Asthma   . Depression   . Gout   . Hypertension   . Stroke [facility])     Patient Active Problem List   Diagnosis Date Noted  . Vitamin D deficiency 11/17/2019  . 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 Current smoker 10/11/2018  . Generalized pain 10/11/2018  . Chronic nonintractable headache 08/06/2017  . Essential hypertension 08/06/2017  . Stroke [facility]) 07/17/2017    Past Surgical History:  Procedure Laterality Date  . LOOP RECORDER INSERTION N/A 08/19/2017   Procedure: LOOP RECORDER INSERTION;  Surgeon: Hillis Range, MD;  Location: [facility] INVASIVE CV LAB;  Service: Cardiovascular;  Laterality: N/A;  . TEE WITHOUT CARDIOVERSION N/A 08/19/2017   Procedure: TRANSESOPHAGEAL ECHOCARDIOGRAM  (TEE);  Surgeon: Pricilla Riffle, MD;  Location: [facility] ENDOSCOPY;  Service: Cardiovascular;  Laterality: N/A;     OB History    Gravida  0   Para  0   Term  0   Preterm  0   AB  0   Living  0     SAB  0   IAB  0   Ectopic  0   Multiple  0   Live Births  0           Family History  Problem Relation Age of Onset  . Heart failure Mother   . Hypertension Mother   . Stroke Neg Hx     Social History   Tobacco Use  . Smoking status: Current Every Day Smoker    Packs/day: 0.25    Types: Cigarettes  . Smokeless tobacco: Never Used  Vaping Use  . Vaping Use: Never used  Substance Use Topics  . Alcohol use: Yes    Comment: rare  . Drug use: No    Home Medications Prior to Admission medications   Medication Sig Start Date End Date Taking? Authorizing Provider  methocarbamol (ROBAXIN) 500 MG tablet Take 1 tablet (500 mg total) by mouth 2 (two) times daily. 09/27/20  Yes Shernita Rabinovich S, PA  albuterol (VENTOLIN HFA) 108 (90 Base) MCG/ACT inhaler INHALE 2 PUFFS INTO THE LUNGS EVERY 4 (FOUR) HOURS AS NEEDED FOR WHEEZING OR SHORTNESS OF BREATH.  06/15/20   Kallie Locks, FNP  allopurinol (ZYLOPRIM) 100 MG tablet Take 1 tablet (100 mg total) by mouth daily. 06/16/20 06/11/21  Kallie Locks, FNP  amLODipine (NORVASC) 10 MG tablet Take 1 tablet (10 mg total) by mouth daily. 06/16/20   Kallie Locks, FNP  colchicine 0.6 MG tablet Take 1 tablet (0.6 mg total) by mouth every 8 (eight) hours as needed (for Gouty attacks). 06/16/20 06/16/21  Kallie Locks, FNP  hydrochlorothiazide (HYDRODIURIL) 25 MG tablet Take 1 tablet (25 mg total) by mouth daily. 06/16/20   Kallie Locks, FNP  ibuprofen (ADVIL) 600 MG tablet Take 1 tablet (600 mg total) by mouth every 6 (six) hours as needed. 07/25/20   Lorelee New, PA-C  oxyCODONE-acetaminophen (PERCOCET) 5-325 MG tablet Take 1 tablet by mouth every 4 (four) hours as needed. 08/16/20   Muthersbaugh, Dahlia Client, PA-C  potassium  chloride SA (KLOR-CON) 20 MEQ tablet Take 1 tablet (20 mEq total) by mouth daily. 08/16/20   Muthersbaugh, Dahlia Client, PA-C  predniSONE (DELTASONE) 20 MG tablet 3 tabs po daily x 3 days, then 2 tabs x 3 days, then 1.5 tabs x 3 days, then 1 tab x 3 days, then 0.5 tabs x 3 days 08/16/20   Muthersbaugh, Dahlia Client, PA-C    Allergies    Patient has no known allergies.  Review of Systems   Review of Systems  Constitutional: Negative for fever.  HENT: Negative for congestion.   Respiratory: Negative for shortness of breath.   Cardiovascular: Negative for chest pain.  Gastrointestinal: Negative for abdominal distention.  Musculoskeletal:       Left knee pain  Neurological: Negative for dizziness and headaches.    Physical Exam Updated Vital Signs BP (!) 151/99 (BP Location: Right Arm)   Pulse 75   Temp 98.2 F (36.8 C)   Resp 18   SpO2 100%   Physical Exam Vitals and nursing note reviewed.  Constitutional:      General: She is not in acute distress.    Appearance: Normal appearance. She is not ill-appearing.  HENT:     Head: Normocephalic and atraumatic.  Eyes:     General: No scleral icterus.       Right eye: No discharge.        Left eye: No discharge.     Conjunctiva/sclera: Conjunctivae normal.  Pulmonary:     Effort: Pulmonary effort is normal.     Breath sounds: No stridor.  Abdominal:     Tenderness: There is no abdominal tenderness.  Musculoskeletal:     Comments: FROM of left knee and left hip.  No significant tenderness palpation of the medial lateral posterior or anterior knee.  Patella is in place.  No significant instability on anterior posterior drawer. Strength grossly within normal limits.  +ballotment left knee  Mild tenderness palpation of the left gluteal muscle.  There is a single trigger point here.  Neurological:     Mental Status: She is alert and oriented to person, place, and time. Mental status is at baseline.     ED Results / Procedures / Treatments    Labs (all labs ordered are listed, but only abnormal results are displayed) Labs Reviewed - No data to display  EKG None  Radiology DG Knee Complete 4 Views Left  Result Date: 09/27/2020 CLINICAL DATA:  Left-sided knee pain, no known injury, initial encounter EXAM: LEFT KNEE - COMPLETE 4+ VIEW COMPARISON:  07/25/2020 FINDINGS: Tricompartmental degenerative changes are noted worst in  the medial joint space. This has progressed slightly in the interval from the prior exam. No acute fracture or dislocation is noted. Small joint effusion is noted improved from the prior exam. No other soft tissue abnormality is seen. IMPRESSION: Tricompartmental degenerative change without acute fracture Small joint effusion improved from the prior examination. Electronically Signed   By: Alcide Clever M.D.   On: 09/27/2020 09:53    Procedures Procedures   Medications Ordered in ED Medications  acetaminophen (TYLENOL) tablet 1,000 mg (has no administration in time range)    ED Course  I have reviewed the triage vital signs and the nursing notes.  Pertinent labs & imaging results that were available during my care of the patient were reviewed by me and considered in my medical decision making (see chart for details).    MDM Rules/Calculators/A&P                          Patient is 40 year old female presented today with left knee pain is been ongoing for several months.  She had trauma to it 2 months ago.  It was x-rayed at that time with a small irregularity on the superior tibia which was favored to be degenerative change rather than acute fracture.  Repeat x-ray today shows no acute fracture.  Continues to show a stable advanced degenerative tricompartmental arthritis.  Given her reassuring examination I suspect that this is chronic arthritis perhaps somewhat exacerbated by trauma several months ago.  Will provide patient with a knee sleeve offered crutches which she declined.  Will provide patient of  Tylenol and ibuprofen instructions 1 dose of Tylenol here in the ER.  She is also having some muscle pain in the left gluteal muscle which is reproducible on my examination of tenderness to palpation over the left gluteal muscle I suspect this is from compensation from limping from her left knee pain.  I agree with radiologist read of x-ray.  No acute fracture.  Patient discharged with Tylenol ibuprofen and Robaxin.  Will follow up with [facility].  Final Clinical Impression(s) / ED Diagnoses Final diagnoses:  Muscle strain  Osteoarthrosis, localized, primary, knee, left    Rx / DC Orders ED Discharge Orders         Ordered    methocarbamol (ROBAXIN) 500 MG tablet  2 times daily        09/27/20 1206           Solon Augusta Wheaton, [place] 09/27/20 1215    Arby Barrette, MD 09/28/20 [phone]

## 2020-09-27 NOTE — ED Triage Notes (Signed)
Patient complains of left knee pain and pain radiating to hip that she reports is related to her fracture of same 1 month ago. Patient did not follow up with ortho

## 2020-09-27 NOTE — Discharge Instructions (Signed)
Your left knee x-ray 2 months ago was found to have a small irregularity along the top of the tibia.  This is favored to be likely degenerative changes from osteoarthritis.  The primary treatment for this is Tylenol, ibuprofen and weight loss.  A compression sleeve can be very helpful.  I also recommend cold compresses in the form of ice or frozen peas.  Please follow-up with the [facility] for further evaluation.  You may use Robaxin as needed for muscle pain primarily from your left glued muscle.  I suspect this is from compensating and limping due to your ongoing left knee pain.  Please use Tylenol or ibuprofen for pain.  You may use 600 mg ibuprofen every 6 hours or 1000 mg of Tylenol every 6 hours.  You may choose to alternate between the 2.  This would be most effective.  Not to exceed 4 g of Tylenol within 24 hours.  Not to exceed 3200 mg ibuprofen 24 hours.

## 2020-10-07 ENCOUNTER — Other Ambulatory Visit: Payer: Self-pay

## 2020-10-30 ENCOUNTER — Other Ambulatory Visit: Payer: Self-pay

## 2020-10-30 MED FILL — Amlodipine Besylate Tab 10 MG (Base Equivalent): ORAL | 30 days supply | Qty: 30 | Fill #0 | Status: AC

## 2020-10-30 MED FILL — Hydrochlorothiazide Tab 25 MG: ORAL | 30 days supply | Qty: 30 | Fill #0 | Status: AC

## 2020-10-31 ENCOUNTER — Other Ambulatory Visit: Payer: Self-pay

## 2020-11-14 ENCOUNTER — Ambulatory Visit: Payer: Self-pay | Admitting: Family Medicine

## 2020-11-16 ENCOUNTER — Emergency Department (HOSPITAL_COMMUNITY)
Admission: EM | Admit: 2020-11-16 | Discharge: 2020-11-16 | Disposition: A | Discharge status: Home / Self Care | Payer: Self-pay | Attending: Emergency Medicine | Admitting: Emergency Medicine

## 2020-11-16 ENCOUNTER — Other Ambulatory Visit: Payer: Self-pay

## 2020-11-16 ENCOUNTER — Emergency Department (HOSPITAL_COMMUNITY): Payer: Self-pay

## 2020-11-16 DIAGNOSIS — M25421 Effusion, right elbow: Secondary | ICD-10-CM | POA: Insufficient documentation

## 2020-11-16 DIAGNOSIS — F17210 Nicotine dependence, cigarettes, uncomplicated: Secondary | ICD-10-CM | POA: Insufficient documentation

## 2020-11-16 DIAGNOSIS — Y92000 Kitchen of unspecified non-institutional (private) residence as  the place of occurrence of the external cause: Secondary | ICD-10-CM | POA: Insufficient documentation

## 2020-11-16 DIAGNOSIS — Z79899 Other long term (current) drug therapy: Secondary | ICD-10-CM | POA: Insufficient documentation

## 2020-11-16 DIAGNOSIS — J45909 Unspecified asthma, uncomplicated: Secondary | ICD-10-CM | POA: Insufficient documentation

## 2020-11-16 DIAGNOSIS — S4991XA Unspecified injury of right shoulder and upper arm, initial encounter: Secondary | ICD-10-CM

## 2020-11-16 DIAGNOSIS — M25521 Pain in right elbow: Secondary | ICD-10-CM | POA: Insufficient documentation

## 2020-11-16 DIAGNOSIS — W1839XA Other fall on same level, initial encounter: Secondary | ICD-10-CM | POA: Insufficient documentation

## 2020-11-16 DIAGNOSIS — I10 Essential (primary) hypertension: Secondary | ICD-10-CM | POA: Insufficient documentation

## 2020-11-16 DIAGNOSIS — Y9389 Activity, other specified: Secondary | ICD-10-CM | POA: Insufficient documentation

## 2020-11-16 DIAGNOSIS — M79644 Pain in right finger(s): Secondary | ICD-10-CM | POA: Insufficient documentation

## 2020-11-16 MED ORDER — OXYCODONE-ACETAMINOPHEN 5-325 MG PO TABS
1.0000 | ORAL_TABLET | Freq: Once | ORAL | Status: AC
  Administered 2020-11-16: 1 via ORAL
  Filled 2020-11-16: qty 1

## 2020-11-16 MED ORDER — OXYCODONE HCL 5 MG PO TABS: 5.0000 mg | ORAL_TABLET | Freq: Three times a day (TID) | ORAL | 0 refills | Status: DC | PRN

## 2020-11-16 MED ORDER — PREDNISONE 20 MG PO TABS: 40.0000 mg | ORAL_TABLET | Freq: Every day | ORAL | 0 refills | Status: DC

## 2020-11-16 NOTE — Discharge Instructions (Signed)
Please follow-up with an orthopedic doctor tomorrow or the next day to have your elbow reevaluated.  Please return to the emergency department for worsening pain, redness, swelling, or fever.  Otherwise take the prednisone take the oxycodone for breakthrough pain as discussed.

## 2020-11-16 NOTE — ED Provider Notes (Addendum)
[facility] EMERGENCY DEPARTMENT Provider Note   CSN: [id number] Arrival date & time: 11/16/20  0240     History No chief complaint on file.   Suzanne Graham is a 40 y.o. female.  HPI Presents for right arm pain after a fall.  She fell 2 days ago in her kitchen, landing on her right side on the wood floor.  Did not hit her head or neck or pass out.  She says that she felt fine after the fall and was not experiencing much pain but over the last 24 hours she has had the gradual onset of right arm pain present in her right elbow and right hand and right thumb.  She has a history of gout and says she thinks this could be consistent with a gout flare due to it being present in her thumb and her joints (pointing to her PIPs of her right hand).  She takes allopurinol for gout and currently takes colchicine which she says is at the instruction of her doctor.     Past Medical History:  Diagnosis Date  . Acid reflux 08/2019  . Asthma   . Depression   . Gout   . Hypertension   . Stroke [facility])     Patient Active Problem List   Diagnosis Date Noted  . Vitamin D deficiency 11/17/2019  . 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 Current smoker 10/11/2018  . Generalized pain 10/11/2018  . Chronic nonintractable headache 08/06/2017  . Essential hypertension 08/06/2017  . Stroke [facility]) 07/17/2017    Past Surgical History:  Procedure Laterality Date  . LOOP RECORDER INSERTION N/A 08/19/2017   Procedure: LOOP RECORDER INSERTION;  Surgeon: Hillis Range, MD;  Location: [facility] INVASIVE CV LAB;  Service: Cardiovascular;  Laterality: N/A;  . TEE WITHOUT CARDIOVERSION N/A 08/19/2017   Procedure: TRANSESOPHAGEAL ECHOCARDIOGRAM (TEE);  Surgeon: Pricilla Riffle, MD;  Location: [facility] ENDOSCOPY;  Service: Cardiovascular;  Laterality: N/A;     OB History    Gravida  0   Para  0   Term  0   Preterm  0   AB  0   Living  0     SAB  0   IAB  0   Ectopic  0   Multiple  0   Live Births   0           Family History  Problem Relation Age of Onset  . Heart failure Mother   . Hypertension Mother   . Stroke Neg Hx     Social History   Tobacco Use  . Smoking status: Current Every Day Smoker    Packs/day: 0.25    Types: Cigarettes  . Smokeless tobacco: Never Used  Vaping Use  . Vaping Use: Never used  Substance Use Topics  . Alcohol use: Yes    Comment: rare  . Drug use: No    Home Medications Prior to Admission medications   Medication Sig Start Date End Date Taking? Authorizing Provider  albuterol (VENTOLIN HFA) 108 (90 Base) MCG/ACT inhaler INHALE 2 PUFFS INTO THE LUNGS EVERY 4 (FOUR) HOURS AS NEEDED FOR WHEEZING OR SHORTNESS OF BREATH. 06/15/20   Kallie Locks, FNP  allopurinol (ZYLOPRIM) 100 MG tablet Take 1 tablet (100 mg total) by mouth daily. 06/16/20 06/11/21  Kallie Locks, FNP  amLODipine (NORVASC) 10 MG tablet Take 1 tablet (10 mg total) by mouth daily. 06/16/20   Kallie Locks, FNP  amLODipine (NORVASC) 10 MG tablet  TAKE 1 TABLET BY MOUTH DAILY 05/05/20 05/05/21  Kallie Locks, FNP  colchicine 0.6 MG tablet TAKE 1 TABLET (0.6 MG TOTAL) BY MOUTH EVERY 8 (EIGHT) HOURS AS NEEDED (FOR GOUTY ATTACKS). 06/16/20 06/16/21  Kallie Locks, FNP  hydrochlorothiazide (HYDRODIURIL) 25 MG tablet TAKE 1 TABLET (25 MG TOTAL) BY MOUTH DAILY. 06/16/20 06/16/21  Kallie Locks, FNP  ibuprofen (ADVIL) 600 MG tablet TAKE 1 TABLET BY MOUTH EVERY 6 HOURS AS NEEDED. 07/25/20 07/25/21  Lorelee New, PA-C  methocarbamol (ROBAXIN) 500 MG tablet TAKE 1 TABLET (500 MG TOTAL) BY MOUTH 2 (TWO) TIMES DAILY. 09/27/20 09/27/21  Gailen Shelter, PA  oxyCODONE-acetaminophen (PERCOCET) 5-325 MG tablet Take 1 tablet by mouth every 4 (four) hours as needed. 08/16/20   Muthersbaugh, Dahlia Client, PA-C  potassium chloride SA (KLOR-CON) 20 MEQ tablet TAKE 1 TABLET (20 MEQ TOTAL) BY MOUTH DAILY. 08/16/20 08/16/21  Muthersbaugh, Dahlia Client, PA-C  predniSONE (DELTASONE) 20 MG tablet TAKE 3  TABLETS BY MOUTH DAILY FOR 3 DAYS, THEN 2 TABLETS FOR 3 DAYS, THEN 1.5 TABLETS FOR 3 DAYS, THEN 1 TABLET FOR 3 DAYS, THEN 0.5 TABLET FOR 3 DAYS 08/16/20 08/16/21  Muthersbaugh, Dahlia Client, PA-C    Allergies    Patient has no known allergies.  Review of Systems   Review of Systems  Constitutional: Negative for chills and fever.  HENT: Negative for ear pain and sore throat.   Eyes: Negative for pain and visual disturbance.  Respiratory: Negative for cough and shortness of breath.   Cardiovascular: Negative for chest pain and palpitations.  Gastrointestinal: Negative for abdominal pain and vomiting.  Genitourinary: Negative for dysuria and hematuria.  Musculoskeletal: Negative for arthralgias and back pain.  Skin: Negative for color change and rash.  Neurological: Negative for seizures and syncope.  All other systems reviewed and are negative.   Physical Exam Updated Vital Signs There were no vitals taken for this visit.  Physical Exam Vitals and nursing note reviewed.  Constitutional:      General: She is not in acute distress.    Appearance: She is well-developed.  HENT:     Head: Normocephalic and atraumatic.  Eyes:     Conjunctiva/sclera: Conjunctivae normal.  Cardiovascular:     Rate and Rhythm: Normal rate and regular rhythm.     Heart sounds: No murmur heard.   Pulmonary:     Effort: Pulmonary effort is normal. No respiratory distress.     Breath sounds: Normal breath sounds.  Abdominal:     Palpations: Abdomen is soft.     Tenderness: There is no abdominal tenderness.  Musculoskeletal:     Cervical back: Neck supple.     Comments: R elbow swelling c/w bursa swelling. ROM limited by pain  Hand NVI distally with 2+ ulnar and radial pulses, light touch sensation intact, handgrip limited by pain  ROM thumb and fingers intact, painful. Pain most significant with passive range of motion of the DIP of the thumb and PIP joints of the second through fifth fingers  Skin:     General: Skin is warm and dry.  Neurological:     General: No focal deficit present.     Mental Status: She is alert. Mental status is at baseline.  Psychiatric:        Thought Content: Thought content normal.        Judgment: Judgment normal.     ED Results / Procedures / Treatments   Labs (all labs ordered are listed, but only abnormal results  are displayed) Labs Reviewed - No data to display  EKG None  Radiology No results found.  Procedures Procedures   Medications Ordered in ED Medications - No data to display  ED Course  I have reviewed the triage vital signs and the nursing notes.  Pertinent labs & imaging results that were available during my care of the patient were reviewed by me and considered in my medical decision making (see chart for details).    MDM Rules/Calculators/A&P                           Presents with fall and arm pain.  History and physical most consistent with right arm bursitis, gout flare.  Occult fracture considered although given the timeline of symptoms seems less likely; will obtain x-rays to evaluate further.  Do not think septic joint is likely in the absence of fever, preceding trauma, history of gout with similar symptoms, no erythema or warmth to the right elbow, swelling consistent with bursitis rather than intra-articular swelling.  Reviewed XRs; they show joint effusion, evidence of bursitis in the elbow, no fx. I had extensive discussion with the patient about her diagnostic possibilities: Gout flare, septic arthritis, hemarthrosis.  She has had a gout flare before that felt similar in the right elbow but the bursitis is not something she recalls experiencing in the past.  I told her that because she has pain with range of motion and a large joint effusion all 3 diagnostic possibilities are considered and we discussed that septic arthritis would be the most damaging and I explained the risk of long-term arthritis, permanent joint  damage, or development of systemic signs of infection such as fever.  I offered to aspirate the joint and counseled her extensively on the risks and benefits; the benefit being we could definitively diagnose septic arthritis and the risks of introducing infection if it was not previously infected and the aseptic technique was imperfect, pain, damage to underlying structures.  She declined the joint tap after understanding the risks and benefits and agrees to follow-up with an orthopedist in the next 1 to 2 days and return to the ER for worsening swelling, redness, or fever.  Given that her pain is present in multiple locations including her elbow and finger joints, I do think gout flare is most likely and this is an acceptable course of action.  Plan is for outpatient pain management with a short course of prednisone given that she already takes colchicine and a few doses of oxycodone for breakthrough pain.  Medications discussed.     Final Clinical Impression(s) / ED Diagnoses Final diagnoses:  None    Rx / DC Orders ED Discharge Orders    None       Jacklynn Bue, MD 11/16/20 1115    Jacklynn Bue, MD 11/16/20 1116    Melene Plan, DO 11/16/20 1119

## 2020-11-16 NOTE — Progress Notes (Signed)
Orthopedic Tech Progress Note Patient Details:  Suzanne Graham February 16, 1981 [id number]  Ortho Devices Type of Ortho Device: Shoulder immobilizer Ortho Device/Splint Location: RUE Ortho Device/Splint Interventions: Ordered,Application,Adjustment   Post Interventions Patient Tolerated: Well Instructions Provided: Adjustment of device,Care of device,Poper ambulation with device   Umair Rosiles 11/16/2020, 11:54 AM

## 2020-11-16 NOTE — ED Triage Notes (Signed)
Pt fell 2 days ago. C/O right arm, wrist and elbow swelling. Also states she has hx of gout.

## 2020-12-07 ENCOUNTER — Encounter (HOSPITAL_COMMUNITY): Payer: Self-pay | Admitting: Emergency Medicine

## 2020-12-07 ENCOUNTER — Other Ambulatory Visit: Payer: Self-pay

## 2020-12-07 ENCOUNTER — Emergency Department (HOSPITAL_COMMUNITY)
Admission: EM | Admit: 2020-12-07 | Discharge: 2020-12-07 | Disposition: A | Discharge status: Home / Self Care | Payer: Medicaid Other | Attending: Emergency Medicine | Admitting: Emergency Medicine

## 2020-12-07 DIAGNOSIS — Z79899 Other long term (current) drug therapy: Secondary | ICD-10-CM | POA: Insufficient documentation

## 2020-12-07 DIAGNOSIS — M109 Gout, unspecified: Secondary | ICD-10-CM

## 2020-12-07 DIAGNOSIS — F17210 Nicotine dependence, cigarettes, uncomplicated: Secondary | ICD-10-CM | POA: Diagnosis not present

## 2020-12-07 DIAGNOSIS — I10 Essential (primary) hypertension: Secondary | ICD-10-CM | POA: Diagnosis not present

## 2020-12-07 DIAGNOSIS — J45909 Unspecified asthma, uncomplicated: Secondary | ICD-10-CM | POA: Insufficient documentation

## 2020-12-07 MED ORDER — OXYCODONE-ACETAMINOPHEN 5-325 MG PO TABS
2.0000 | ORAL_TABLET | Freq: Once | ORAL | Status: AC
  Administered 2020-12-07: 2 via ORAL
  Filled 2020-12-07: qty 2

## 2020-12-07 MED ORDER — PREDNISONE 10 MG PO TABS
20.0000 mg | ORAL_TABLET | Freq: Every day | ORAL | 0 refills | Status: DC
  Filled 2020-12-07: qty 10, 5d supply, fill #0

## 2020-12-07 MED ORDER — OXYCODONE-ACETAMINOPHEN 5-325 MG PO TABS
1.0000 | ORAL_TABLET | Freq: Four times a day (QID) | ORAL | 0 refills | Status: DC | PRN
  Filled 2020-12-07: qty 10, 3d supply, fill #0

## 2020-12-07 MED ORDER — PREDNISONE 20 MG PO TABS
40.0000 mg | ORAL_TABLET | Freq: Once | ORAL | Status: AC
  Administered 2020-12-07: 40 mg via ORAL
  Filled 2020-12-07: qty 2

## 2020-12-07 MED ORDER — OXYCODONE-ACETAMINOPHEN 5-325 MG PO TABS: 1.0000 | ORAL_TABLET | Freq: Four times a day (QID) | ORAL | 0 refills | Status: DC | PRN

## 2020-12-07 MED FILL — Colchicine Tab 0.6 MG: ORAL | 30 days supply | Qty: 90 | Fill #0 | Status: AC

## 2020-12-07 NOTE — ED Triage Notes (Signed)
Patient here for evaluation of gout flareup in left foot and left hand. Patient is prescribed colchicine, states she has been taking it without relief. Patient alert, oriented, and in on apparent relief at this time.

## 2020-12-07 NOTE — ED Provider Notes (Signed)
[facility] EMERGENCY DEPARTMENT Provider Note   CSN: [id number] Arrival date & time: 12/07/20  1696     History Chief Complaint  Patient presents with  . Gout    Suzanne Graham is a 40 y.o. female.  HPI  40 year old female history of gout presents today complaining of pain and swelling in her left foot left hand and left upper extremity consistent with gouty flare.  She has had this occur several times in the past.  It is very painful.  She has been taking ibuprofen and her colchicine without relief.  She denies fever, chills, injury, open wounds.     Past Medical History:  Diagnosis Date  . Acid reflux 08/2019  . Asthma   . Depression   . Gout   . Hypertension   . Stroke [facility])     Patient Active Problem List   Diagnosis Date Noted  . Vitamin D deficiency 11/17/2019  . 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 Current smoker 10/11/2018  . Generalized pain 10/11/2018  . Chronic nonintractable headache 08/06/2017  . Essential hypertension 08/06/2017  . Stroke [facility]) 07/17/2017    Past Surgical History:  Procedure Laterality Date  . LOOP RECORDER INSERTION N/A 08/19/2017   Procedure: LOOP RECORDER INSERTION;  Surgeon: Hillis Range, MD;  Location: [facility] INVASIVE CV LAB;  Service: Cardiovascular;  Laterality: N/A;  . TEE WITHOUT CARDIOVERSION N/A 08/19/2017   Procedure: TRANSESOPHAGEAL ECHOCARDIOGRAM (TEE);  Surgeon: Pricilla Riffle, MD;  Location: [facility] ENDOSCOPY;  Service: Cardiovascular;  Laterality: N/A;     OB History    Gravida  0   Para  0   Term  0   Preterm  0   AB  0   Living  0     SAB  0   IAB  0   Ectopic  0   Multiple  0   Live Births  0           Family History  Problem Relation Age of Onset  . Heart failure Mother   . Hypertension Mother   . Stroke Neg Hx     Social History   Tobacco Use  . Smoking status: Current Every Day Smoker    Packs/day: 0.25    Types: Cigarettes  . Smokeless tobacco: Never Used  Vaping  Use  . Vaping Use: Never used  Substance Use Topics  . Alcohol use: Yes    Comment: rare  . Drug use: No    Home Medications Prior to Admission medications   Medication Sig Start Date End Date Taking? Authorizing Provider  oxyCODONE-acetaminophen (PERCOCET/ROXICET) 5-325 MG tablet Take 1 tablet by mouth every 6 (six) hours as needed for severe pain. 12/07/20  Yes Margarita Grizzle, MD  predniSONE (DELTASONE) 10 MG tablet Take 2 tablets (20 mg total) by mouth daily. 12/07/20  Yes Falan Hensler, Duwayne Heck, MD  albuterol (VENTOLIN HFA) 108 (90 Base) MCG/ACT inhaler INHALE 2 PUFFS INTO THE LUNGS EVERY 4 (FOUR) HOURS AS NEEDED FOR WHEEZING OR SHORTNESS OF BREATH. Patient not taking: Reported on 11/16/2020 06/15/20   Kallie Locks, FNP  allopurinol (ZYLOPRIM) 100 MG tablet Take 1 tablet (100 mg total) by mouth daily. Patient not taking: Reported on 11/16/2020 06/16/20 06/11/21  Kallie Locks, FNP  amLODipine (NORVASC) 10 MG tablet Take 1 tablet (10 mg total) by mouth daily. Patient not taking: No sig reported 06/16/20   Kallie Locks, FNP  amLODipine (NORVASC) 10 MG tablet TAKE 1 TABLET BY MOUTH DAILY  Patient taking differently: Take 10 mg by mouth daily. 05/05/20 05/05/21  Kallie Locks, FNP  colchicine 0.6 MG tablet TAKE 1 TABLET (0.6 MG TOTAL) BY MOUTH EVERY 8 (EIGHT) HOURS AS NEEDED (FOR GOUTY ATTACKS). Patient taking differently: Take 0.6 mg by mouth every 6 (six) hours as needed (Gout attacks). 06/16/20 06/16/21  Kallie Locks, FNP  hydrochlorothiazide (HYDRODIURIL) 25 MG tablet TAKE 1 TABLET (25 MG TOTAL) BY MOUTH DAILY. Patient taking differently: Take 25 mg by mouth daily. 06/16/20 06/16/21  Kallie Locks, FNP  ibuprofen (ADVIL) 600 MG tablet TAKE 1 TABLET BY MOUTH EVERY 6 HOURS AS NEEDED. Patient not taking: Reported on 11/16/2020 07/25/20 07/25/21  Lorelee New, PA-C  methocarbamol (ROBAXIN) 500 MG tablet TAKE 1 TABLET (500 MG TOTAL) BY MOUTH 2 (TWO) TIMES DAILY. Patient not  taking: Reported on 11/16/2020 09/27/20 09/27/21  Gailen Shelter, PA  oxyCODONE (ROXICODONE) 5 MG immediate release tablet Take 1 tablet (5 mg total) by mouth 3 (three) times daily as needed for up to 6 doses for severe pain. 11/16/20   Jacklynn Bue, MD  potassium chloride SA (KLOR-CON) 20 MEQ tablet TAKE 1 TABLET (20 MEQ TOTAL) BY MOUTH DAILY. Patient not taking: Reported on 11/16/2020 08/16/20 08/16/21  Muthersbaugh, Dahlia Client, PA-C    Allergies    Patient has no known allergies.  Review of Systems   Review of Systems  All other systems reviewed and are negative.   Physical Exam Updated Vital Signs BP (!) 125/93 (BP Location: Right Arm)   Pulse 81   Temp 98.6 F (37 C) (Oral)   Resp 14   SpO2 98%   Physical Exam Vitals and nursing note reviewed.  Constitutional:      General: She is not in acute distress.    Appearance: She is well-developed.  HENT:     Head: Normocephalic and atraumatic.     Right Ear: External ear normal.     Left Ear: External ear normal.     Nose: Nose normal.  Eyes:     Conjunctiva/sclera: Conjunctivae normal.     Pupils: Pupils are equal, round, and reactive to light.  Pulmonary:     Effort: Pulmonary effort is normal.  Musculoskeletal:        General: Normal range of motion.     Cervical back: Normal range of motion and neck supple.     Comments: Pain and redness to second MCP joint left hand Pain and redness to great toe left foot   Skin:    General: Skin is warm and dry.  Neurological:     Mental Status: She is alert and oriented to person, place, and time.     Motor: No abnormal muscle tone.     Coordination: Coordination normal.  Psychiatric:        Behavior: Behavior normal.        Thought Content: Thought content normal.     ED Results / Procedures / Treatments   Labs (all labs ordered are listed, but only abnormal results are displayed) Labs Reviewed - No data to display  EKG None  Radiology No results  found.  Procedures Procedures   Medications Ordered in ED Medications  predniSONE (DELTASONE) tablet 40 mg (40 mg Oral Given 12/07/20 0918)  oxyCODONE-acetaminophen (PERCOCET/ROXICET) 5-325 MG per tablet 2 tablet (2 tablets Oral Given 12/07/20 2671)    ED Course  I have reviewed the triage vital signs and the nursing notes.  Pertinent labs & imaging results that were  available during my care of the patient were reviewed by me and considered in my medical decision making (see chart for details).    MDM Rules/Calculators/A&P                         Patient presents today with pain consistent with her known history of gouty arthritis.  It is multi articular today in the left hand and left foot.  Patient has been taking her colchicine.  She is instructed regarding holding her nonsteroidal anti-inflammatories while she is being placed on prednisone today.  She is given a option for 10 Percocet and 2 Percocet given here in the ED.  Discussed return precautions and need for follow-up and voiced understanding. Final Clinical Impression(s) / ED Diagnoses Final diagnoses:  Gouty arthritis    Rx / DC Orders ED Discharge Orders         Ordered    predniSONE (DELTASONE) 10 MG tablet  Daily        12/07/20 0918    oxyCODONE-acetaminophen (PERCOCET/ROXICET) 5-325 MG tablet  Every 6 hours PRN        12/07/20 6063           Margarita Grizzle, MD 12/07/20 [phone]

## 2020-12-07 NOTE — Discharge Instructions (Addendum)
Please continue your colchicine Take prednisone as prescribed.  While you are taking the prednisone do not take ibuprofen.  You may resume ibuprofen once the prednisone dose has been completed Use Percocet as prescribed for pain Do not drive or operate any machinery or do anything that could be dangerous to yourself or others for up to 24 hours after taking Percocet

## 2020-12-22 ENCOUNTER — Other Ambulatory Visit: Payer: Self-pay

## 2020-12-22 MED FILL — Hydrochlorothiazide Tab 25 MG: ORAL | 30 days supply | Qty: 30 | Fill #1 | Status: AC

## 2020-12-22 MED FILL — Amlodipine Besylate Tab 10 MG (Base Equivalent): ORAL | 30 days supply | Qty: 30 | Fill #1 | Status: AC

## 2021-02-22 ENCOUNTER — Other Ambulatory Visit: Payer: Self-pay

## 2021-02-22 MED FILL — Hydrochlorothiazide Tab 25 MG: ORAL | 30 days supply | Qty: 30 | Fill #2 | Status: CN

## 2021-02-22 MED FILL — Amlodipine Besylate Tab 10 MG (Base Equivalent): ORAL | 30 days supply | Qty: 30 | Fill #2 | Status: CN

## 2021-02-26 ENCOUNTER — Other Ambulatory Visit: Payer: Self-pay

## 2021-02-28 ENCOUNTER — Other Ambulatory Visit: Payer: Self-pay

## 2021-02-28 MED FILL — Colchicine Tab 0.6 MG: ORAL | 30 days supply | Qty: 90 | Fill #1 | Status: CN

## 2021-03-01 ENCOUNTER — Other Ambulatory Visit: Payer: Self-pay

## 2021-03-01 MED FILL — Amlodipine Besylate Tab 10 MG (Base Equivalent): ORAL | 30 days supply | Qty: 30 | Fill #2 | Status: CN

## 2021-03-01 MED FILL — Hydrochlorothiazide Tab 25 MG: ORAL | 30 days supply | Qty: 30 | Fill #2 | Status: AC

## 2021-03-05 ENCOUNTER — Encounter (HOSPITAL_COMMUNITY): Payer: Self-pay

## 2021-03-05 ENCOUNTER — Other Ambulatory Visit: Payer: Self-pay

## 2021-03-05 ENCOUNTER — Emergency Department (HOSPITAL_COMMUNITY)
Admission: EM | Admit: 2021-03-05 | Discharge: 2021-03-05 | Disposition: A | Discharge status: Home / Self Care | Payer: Medicaid Other | Attending: Emergency Medicine | Admitting: Emergency Medicine

## 2021-03-05 DIAGNOSIS — F17210 Nicotine dependence, cigarettes, uncomplicated: Secondary | ICD-10-CM | POA: Diagnosis not present

## 2021-03-05 DIAGNOSIS — I10 Essential (primary) hypertension: Secondary | ICD-10-CM | POA: Diagnosis not present

## 2021-03-05 DIAGNOSIS — J45909 Unspecified asthma, uncomplicated: Secondary | ICD-10-CM | POA: Insufficient documentation

## 2021-03-05 DIAGNOSIS — Z79899 Other long term (current) drug therapy: Secondary | ICD-10-CM | POA: Diagnosis not present

## 2021-03-05 DIAGNOSIS — M109 Gout, unspecified: Secondary | ICD-10-CM | POA: Diagnosis not present

## 2021-03-05 DIAGNOSIS — M79675 Pain in left toe(s): Secondary | ICD-10-CM | POA: Diagnosis present

## 2021-03-05 MED ORDER — IBUPROFEN 800 MG PO TABS
800.0000 mg | ORAL_TABLET | Freq: Once | ORAL | Status: AC
  Administered 2021-03-05: 800 mg via ORAL
  Filled 2021-03-05: qty 2

## 2021-03-05 MED ORDER — OXYCODONE-ACETAMINOPHEN 5-325 MG PO TABS
1.0000 | ORAL_TABLET | Freq: Once | ORAL | Status: AC
Start: 2021-03-05 — End: 2021-03-05
  Administered 2021-03-05: 1 via ORAL
  Filled 2021-03-05: qty 1

## 2021-03-05 MED ORDER — PREDNISONE 20 MG PO TABS
20.0000 mg | ORAL_TABLET | Freq: Once | ORAL | Status: AC
  Administered 2021-03-05: 20 mg via ORAL
  Filled 2021-03-05: qty 1

## 2021-03-05 MED ORDER — PREDNISONE 10 MG PO TABS: 20.0000 mg | ORAL_TABLET | Freq: Every day | ORAL | 0 refills | Status: DC

## 2021-03-05 MED ORDER — OXYCODONE-ACETAMINOPHEN 5-325 MG PO TABS
1.0000 | ORAL_TABLET | Freq: Three times a day (TID) | ORAL | 0 refills | Status: DC | PRN
  Filled 2021-03-05: qty 10, 4d supply, fill #0

## 2021-03-05 MED ORDER — PREDNISONE 10 MG PO TABS
20.0000 mg | ORAL_TABLET | Freq: Every day | ORAL | 0 refills | Status: DC
  Filled 2021-03-05: qty 10, 5d supply, fill #0

## 2021-03-05 MED ORDER — OXYCODONE-ACETAMINOPHEN 5-325 MG PO TABS: 1.0000 | ORAL_TABLET | Freq: Three times a day (TID) | ORAL | 0 refills | Status: DC | PRN

## 2021-03-05 NOTE — ED Provider Notes (Signed)
Emergency Medicine Provider Triage Evaluation Note  Suzanne Graham , a 40 y.o. female  was evaluated in triage.  Pt complains of joint pain.  Review of Systems  Positive: Shoulder pain, elbow pain, L great toe pain Negative: Fever, chills, trauma  Physical Exam  BP (!) 148/111   Pulse 98   Temp 99.1 F (37.3 C) (Oral)   Resp 16   SpO2 100%  Gen:   Awake, no distress   Resp:  Normal effort  MSK:   Moves extremities without difficulty  Other:  TTP to L elbow and L great toe with edema and warmth.   Medical Decision Making  Medically screening exam initiated at 9:17 AM.  Appropriate orders placed.  Suzanne Graham was informed that the remainder of the evaluation will be completed by another provider, this initial triage assessment does not replace that evaluation, and the importance of remaining in the ED until their evaluation is complete.  Pt here with multiple joints pain including shoulder, L elbow and L great toe x 3 days.  Felt similar to prior gout.  No trauma, no hx of IVDU.   Fayrene Helper, PA-C 03/05/21 0919    Gloris Manchester, MD 03/07/21 [phone]

## 2021-03-05 NOTE — Discharge Instructions (Addendum)
Return for any problem.  ?

## 2021-03-05 NOTE — ED Provider Notes (Signed)
[facility] EMERGENCY DEPARTMENT Provider Note   CSN: [id number] Arrival date & time: 03/05/21  4098     History No chief complaint on file.   Suzanne Graham is a 40 y.o. female.  40 year old female with prior medical history as detailed below presents for evaluation.  Patient with prior history of gout.  Patient reports recurrent symptoms of gout.  She complains of pain and swelling to the left great toe, left ankle, left elbow.  She reports that these joints have been involved with her gout exacerbations previously.  She has tried colchicine at home without improvement.  Her last visit for similar complaint was in June 2022.  She was given prednisone and Percocet.  She reports improvement in her symptoms within 48 hours.  She requests refill on these same medications.  The history is provided by the patient.  Illness Location:  Gouty exacerbation -painful left great toe and left ankle Severity:  Moderate Onset quality:  Gradual Duration:  2 days Timing:  Constant Progression:  Worsening Chronicity:  New     Past Medical History:  Diagnosis Date   Acid reflux 08/2019   Asthma    Depression    Gout    Hypertension    Stroke [facility])     Patient Active Problem List   Diagnosis Date Noted   Vitamin D deficiency 11/17/2019   Chronic gout of foot 10/11/2018   Current smoker 10/11/2018   Generalized pain 10/11/2018   Chronic nonintractable headache 08/06/2017   Essential hypertension 08/06/2017   Stroke ([facility]) 07/17/2017    Past Surgical History:  Procedure Laterality Date   LOOP RECORDER INSERTION N/A 08/19/2017   Procedure: LOOP RECORDER INSERTION;  Surgeon: Hillis Range, MD;  Location: [facility] INVASIVE CV LAB;  Service: Cardiovascular;  Laterality: N/A;   TEE WITHOUT CARDIOVERSION N/A 08/19/2017   Procedure: TRANSESOPHAGEAL ECHOCARDIOGRAM (TEE);  Surgeon: Pricilla Riffle, MD;  Location: [facility] ENDOSCOPY;  Service: Cardiovascular;  Laterality: N/A;     OB  History     Gravida  0   Para  0   Term  0   Preterm  0   AB  0   Living  0      SAB  0   IAB  0   Ectopic  0   Multiple  0   Live Births  0           Family History  Problem Relation Age of Onset   Heart failure Mother    Hypertension Mother    Stroke Neg Hx     Social History   Tobacco Use   Smoking status: Every Day    Packs/day: 0.25    Types: Cigarettes   Smokeless tobacco: Never  Vaping Use   Vaping Use: Never used  Substance Use Topics   Alcohol use: Yes    Comment: rare   Drug use: No    Home Medications Prior to Admission medications   Medication Sig Start Date End Date Taking? Authorizing Provider  albuterol (VENTOLIN HFA) 108 (90 Base) MCG/ACT inhaler INHALE 2 PUFFS INTO THE LUNGS EVERY 4 (FOUR) HOURS AS NEEDED FOR WHEEZING OR SHORTNESS OF BREATH. Patient not taking: Reported on 11/16/2020 06/15/20   Kallie Locks, FNP  allopurinol (ZYLOPRIM) 100 MG tablet Take 1 tablet (100 mg total) by mouth daily. Patient not taking: Reported on 11/16/2020 06/16/20 06/11/21  Kallie Locks, FNP  amLODipine (NORVASC) 10 MG tablet Take 1 tablet (10 mg total) by mouth  daily. Patient not taking: No sig reported 06/16/20   Kallie Locks, FNP  amLODipine (NORVASC) 10 MG tablet TAKE 1 TABLET BY MOUTH DAILY Patient taking differently: Take 10 mg by mouth daily. 05/05/20 05/05/21  Kallie Locks, FNP  colchicine 0.6 MG tablet TAKE 1 TABLET (0.6 MG TOTAL) BY MOUTH EVERY 8 (EIGHT) HOURS AS NEEDED (FOR GOUTY ATTACKS). Patient taking differently: Take 0.6 mg by mouth every 6 (six) hours as needed (Gout attacks). 06/16/20 06/16/21  Kallie Locks, FNP  hydrochlorothiazide (HYDRODIURIL) 25 MG tablet TAKE 1 TABLET (25 MG TOTAL) BY MOUTH DAILY. Patient taking differently: Take 25 mg by mouth daily. 06/16/20 06/16/21  Kallie Locks, FNP  ibuprofen (ADVIL) 600 MG tablet TAKE 1 TABLET BY MOUTH EVERY 6 HOURS AS NEEDED. Patient not taking: Reported on  11/16/2020 07/25/20 07/25/21  Lorelee New, PA-C  methocarbamol (ROBAXIN) 500 MG tablet TAKE 1 TABLET (500 MG TOTAL) BY MOUTH 2 (TWO) TIMES DAILY. Patient not taking: Reported on 11/16/2020 09/27/20 09/27/21  Gailen Shelter, PA  oxyCODONE-acetaminophen (PERCOCET/ROXICET) 5-325 MG tablet Take 1 tablet by mouth every 6 (six) hours as needed for severe pain. 12/07/20   Margarita Grizzle, MD  potassium chloride SA (KLOR-CON) 20 MEQ tablet TAKE 1 TABLET (20 MEQ TOTAL) BY MOUTH DAILY. Patient not taking: Reported on 11/16/2020 08/16/20 08/16/21  Muthersbaugh, Dahlia Client, PA-C  predniSONE (DELTASONE) 10 MG tablet Take 2 tablets (20 mg total) by mouth daily. 12/07/20   Margarita Grizzle, MD    Allergies    Patient has no known allergies.  Review of Systems   Review of Systems  All other systems reviewed and are negative.  Physical Exam Updated Vital Signs BP (!) 140/102 (BP Location: Left Arm)   Pulse 82   Temp 99.3 F (37.4 C) (Oral)   Resp 16   SpO2 100%   Physical Exam Vitals and nursing note reviewed.  Constitutional:      General: She is not in acute distress.    Appearance: Normal appearance. She is well-developed.  HENT:     Head: Normocephalic and atraumatic.  Eyes:     Conjunctiva/sclera: Conjunctivae normal.     Pupils: Pupils are equal, round, and reactive to light.  Cardiovascular:     Rate and Rhythm: Normal rate and regular rhythm.     Heart sounds: Normal heart sounds.  Pulmonary:     Effort: Pulmonary effort is normal. No respiratory distress.     Breath sounds: Normal breath sounds.  Abdominal:     General: There is no distension.     Palpations: Abdomen is soft.     Tenderness: There is no abdominal tenderness.  Musculoskeletal:        General: Tenderness present. No deformity. Normal range of motion.     Cervical back: Normal range of motion and neck supple.     Comments: Mild diffuse swelling and tenderness noted of the left great toe and the left foot into the left  ankle.  No overt erythema.  Patient's exam is consistent with likely gouty exacerbation (and not a cellulitis).  Skin:    General: Skin is warm and dry.  Neurological:     General: No focal deficit present.     Mental Status: She is alert and oriented to person, place, and time.    ED Results / Procedures / Treatments   Labs (all labs ordered are listed, but only abnormal results are displayed) Labs Reviewed - No data to display  EKG None  Radiology No results found.  Procedures Procedures   Medications Ordered in ED Medications  oxyCODONE-acetaminophen (PERCOCET/ROXICET) 5-325 MG per tablet 1 tablet (has no administration in time range)  predniSONE (DELTASONE) tablet 20 mg (has no administration in time range)  ibuprofen (ADVIL) tablet 800 mg (800 mg Oral Given 03/05/21 0948)    ED Course  I have reviewed the triage vital signs and the nursing notes.  Pertinent labs & imaging results that were available during my care of the patient were reviewed by me and considered in my medical decision making (see chart for details).    MDM Rules/Calculators/A&P                           MDM  MSE complete  Suzanne Graham was evaluated in Emergency Department on 03/05/2021 for the symptoms described in the history of present illness. She was evaluated in the context of the global COVID-19 pandemic, which necessitated consideration that the patient might be at risk for infection with the SARS-CoV-2 virus that causes COVID-19. Institutional protocols and algorithms that pertain to the evaluation of patients at risk for COVID-19 are in a state of rapid change based on information released by regulatory bodies including the CDC and federal and state organizations. These policies and algorithms were followed during the patient's care in the ED.  Patient is presenting with complaint of gouty exacerbation.  Patient's describe symptoms are consistent with prior gouty exacerbations.  Patient  reports minimal control of symptoms with over-the-counter medications and colchicine.  Prior exacerbations treated successfully with prednisone and Percocet.  Patient will be prescribed a short course of both.  Patient does understand need for close follow-up.  Strict return precautions given and understood.    Final Clinical Impression(s) / ED Diagnoses Final diagnoses:  Exacerbation of gout    Rx / DC Orders ED Discharge Orders          Ordered    predniSONE (DELTASONE) 10 MG tablet  Daily        03/05/21 1331    oxyCODONE-acetaminophen (PERCOCET/ROXICET) 5-325 MG tablet  Every 8 hours PRN        03/05/21 1331             Wynetta Fines, MD 03/05/21 1332

## 2021-03-05 NOTE — ED Triage Notes (Signed)
Patient complains of left foot pain and swelling with left elbow swelling x 3-4 days with increased pain, hx of gout.

## 2021-03-06 ENCOUNTER — Other Ambulatory Visit: Payer: Self-pay

## 2021-03-07 ENCOUNTER — Other Ambulatory Visit: Payer: Self-pay

## 2021-03-07 ENCOUNTER — Emergency Department (HOSPITAL_COMMUNITY)
Admission: EM | Admit: 2021-03-07 | Discharge: 2021-03-07 | Disposition: A | Discharge status: Home / Self Care | Payer: Medicaid Other | Attending: Emergency Medicine | Admitting: Emergency Medicine

## 2021-03-07 ENCOUNTER — Emergency Department (HOSPITAL_BASED_OUTPATIENT_CLINIC_OR_DEPARTMENT_OTHER): Payer: Medicaid Other

## 2021-03-07 ENCOUNTER — Encounter (HOSPITAL_COMMUNITY): Payer: Self-pay

## 2021-03-07 DIAGNOSIS — M7989 Other specified soft tissue disorders: Secondary | ICD-10-CM

## 2021-03-07 DIAGNOSIS — F17210 Nicotine dependence, cigarettes, uncomplicated: Secondary | ICD-10-CM | POA: Insufficient documentation

## 2021-03-07 DIAGNOSIS — J45909 Unspecified asthma, uncomplicated: Secondary | ICD-10-CM | POA: Insufficient documentation

## 2021-03-07 DIAGNOSIS — Z79899 Other long term (current) drug therapy: Secondary | ICD-10-CM | POA: Insufficient documentation

## 2021-03-07 DIAGNOSIS — M2550 Pain in unspecified joint: Secondary | ICD-10-CM

## 2021-03-07 DIAGNOSIS — M79672 Pain in left foot: Secondary | ICD-10-CM | POA: Insufficient documentation

## 2021-03-07 DIAGNOSIS — R52 Pain, unspecified: Secondary | ICD-10-CM

## 2021-03-07 DIAGNOSIS — I10 Essential (primary) hypertension: Secondary | ICD-10-CM | POA: Insufficient documentation

## 2021-03-07 DIAGNOSIS — M79605 Pain in left leg: Secondary | ICD-10-CM

## 2021-03-07 DIAGNOSIS — M25552 Pain in left hip: Secondary | ICD-10-CM | POA: Diagnosis present

## 2021-03-07 LAB — BASIC METABOLIC PANEL
Anion gap: 12 (ref 5–15)
BUN: 21 mg/dL — ABNORMAL HIGH (ref 6–20)
CO2: 24 mmol/L (ref 22–32)
Calcium: 9.3 mg/dL (ref 8.9–10.3)
Chloride: 103 mmol/L (ref 98–111)
Creatinine, Ser: 1.7 mg/dL — ABNORMAL HIGH (ref 0.44–1.00)
GFR, Estimated: 38 mL/min — ABNORMAL LOW (ref >60)
Glucose, Bld: 72 mg/dL (ref 70–99)
Potassium: 4 mmol/L (ref 3.5–5.1)
Sodium: 139 mmol/L (ref 135–145)

## 2021-03-07 LAB — HEPATIC FUNCTION PANEL
ALT: 20 U/L (ref 0–44)
AST: 27 U/L (ref 15–41)
Albumin: 2.8 g/dL — ABNORMAL LOW (ref 3.5–5.0)
Alkaline Phosphatase: 76 U/L (ref 38–126)
Bilirubin, Direct: 0.3 mg/dL — ABNORMAL HIGH (ref 0.0–0.2)
Indirect Bilirubin: 0.8 mg/dL (ref 0.3–0.9)
Total Bilirubin: 1.1 mg/dL (ref 0.3–1.2)
Total Protein: 7.5 g/dL (ref 6.5–8.1)

## 2021-03-07 LAB — CBC WITH DIFFERENTIAL/PLATELET
Abs Immature Granulocytes: 0.06 10*3/uL (ref 0.00–0.07)
Basophils Absolute: 0 10*3/uL (ref 0.0–0.1)
Basophils Relative: 0 %
Eosinophils Absolute: 0 10*3/uL (ref 0.0–0.5)
Eosinophils Relative: 0 %
HCT: 34.8 % — ABNORMAL LOW (ref 36.0–46.0)
Hemoglobin: 11 g/dL — ABNORMAL LOW (ref 12.0–15.0)
Immature Granulocytes: 0 %
Lymphocytes Relative: 7 %
Lymphs Abs: 0.9 10*3/uL (ref 0.7–4.0)
MCH: 25.5 pg — ABNORMAL LOW (ref 26.0–34.0)
MCHC: 31.6 g/dL (ref 30.0–36.0)
MCV: 80.7 fL (ref 80.0–100.0)
Monocytes Absolute: 1.2 10*3/uL — ABNORMAL HIGH (ref 0.1–1.0)
Monocytes Relative: 9 %
Neutro Abs: 11.9 10*3/uL — ABNORMAL HIGH (ref 1.7–7.7)
Neutrophils Relative %: 84 %
Platelets: 308 10*3/uL (ref 150–400)
RBC: 4.31 MIL/uL (ref 3.87–5.11)
RDW: 14.9 % (ref 11.5–15.5)
WBC: 14.1 10*3/uL — ABNORMAL HIGH (ref 4.0–10.5)
nRBC: 0 % (ref 0.0–0.2)

## 2021-03-07 LAB — SEDIMENTATION RATE: Sed Rate: 68 mm/hr — ABNORMAL HIGH (ref 0–22)

## 2021-03-07 LAB — C-REACTIVE PROTEIN: CRP: 20.1 mg/dL — ABNORMAL HIGH (ref <1.0)

## 2021-03-07 MED ORDER — IBUPROFEN 800 MG PO TABS
800.0000 mg | ORAL_TABLET | Freq: Once | ORAL | Status: AC
  Administered 2021-03-07: 800 mg via ORAL
  Filled 2021-03-07: qty 1

## 2021-03-07 MED ORDER — MORPHINE SULFATE (PF) 4 MG/ML IV SOLN
4.0000 mg | Freq: Once | INTRAVENOUS | Status: AC
  Administered 2021-03-07: 4 mg via INTRAVENOUS
  Filled 2021-03-07: qty 1

## 2021-03-07 MED ORDER — HYDROMORPHONE HCL 1 MG/ML IJ SOLN
1.0000 mg | Freq: Once | INTRAMUSCULAR | Status: AC
Start: 2021-03-07 — End: 2021-03-07
  Administered 2021-03-07: 1 mg via INTRAVENOUS
  Filled 2021-03-07: qty 1

## 2021-03-07 MED ORDER — COLCHICINE 0.6 MG PO TABS
ORAL_TABLET | ORAL | 3 refills | Status: DC
  Filled 2021-03-07: qty 90, 30d supply, fill #0

## 2021-03-07 MED ORDER — IBUPROFEN 600 MG PO TABS
600.0000 mg | ORAL_TABLET | Freq: Four times a day (QID) | ORAL | 0 refills | Status: DC | PRN
  Filled 2021-03-07: qty 30, 8d supply, fill #0

## 2021-03-07 NOTE — ED Provider Notes (Signed)
[facility] EMERGENCY DEPARTMENT Provider Note   CSN: [id number] Arrival date & time: 03/07/21  1313     History Chief Complaint  Patient presents with   Gout    Suzanne Graham is a 40 y.o. female.  The history is provided by the patient and medical records. No language interpreter was used.   40 year old female significant history of gout, acid reflux, hypertension, brought here via EMS from home with concerns of a gouty flare.  Patient report for the past for 5 days she has had progressive worsening pain to her left elbow, hip, ankle and feet as well as right fingers.  Pain is sharp achy throbbing intense, felt similar to prior gouty flare.  Pain worsens with any other movement or with palpation.  No associated fever chest pain shortness of breath productive cough focal numbness or focal weakness.  No recent injury.  She was seen in the ED 2 days ago for her complaint and was prescribed prednisone and Percocet.  She report medication did not provide adequate relief.  Previously she was taking colchicine without improvement.  No prior history of PE or DVT no recent surgery prolonged bedrest active cancer or taking oral hormone.  No history of IV drug use.  Past Medical History:  Diagnosis Date   Acid reflux 08/2019   Asthma    Depression    Gout    Hypertension    Stroke [facility])     Patient Active Problem List   Diagnosis Date Noted   Vitamin D deficiency 11/17/2019   Chronic gout of foot 10/11/2018   Current smoker 10/11/2018   Generalized pain 10/11/2018   Chronic nonintractable headache 08/06/2017   Essential hypertension 08/06/2017   Stroke (HCC) 07/17/2017    Past Surgical History:  Procedure Laterality Date   LOOP RECORDER INSERTION N/A 08/19/2017   Procedure: LOOP RECORDER INSERTION;  Surgeon: Hillis Range, MD;  Location: [facility] INVASIVE CV LAB;  Service: Cardiovascular;  Laterality: N/A;   TEE WITHOUT CARDIOVERSION N/A 08/19/2017   Procedure:  TRANSESOPHAGEAL ECHOCARDIOGRAM (TEE);  Surgeon: Pricilla Riffle, MD;  Location: [facility] ENDOSCOPY;  Service: Cardiovascular;  Laterality: N/A;     OB History     Gravida  0   Para  0   Term  0   Preterm  0   AB  0   Living  0      SAB  0   IAB  0   Ectopic  0   Multiple  0   Live Births  0           Family History  Problem Relation Age of Onset   Heart failure Mother    Hypertension Mother    Stroke Neg Hx     Social History   Tobacco Use   Smoking status: Every Day    Packs/day: 0.25    Types: Cigarettes   Smokeless tobacco: Never  Vaping Use   Vaping Use: Never used  Substance Use Topics   Alcohol use: Yes    Comment: rare   Drug use: No    Home Medications Prior to Admission medications   Medication Sig Start Date End Date Taking? Authorizing Provider  albuterol (VENTOLIN HFA) 108 (90 Base) MCG/ACT inhaler INHALE 2 PUFFS INTO THE LUNGS EVERY 4 (FOUR) HOURS AS NEEDED FOR WHEEZING OR SHORTNESS OF BREATH. Patient not taking: Reported on 11/16/2020 06/15/20   Kallie Locks, FNP  allopurinol (ZYLOPRIM) 100 MG tablet Take 1 tablet (100 mg  total) by mouth daily. Patient not taking: Reported on 11/16/2020 06/16/20 06/11/21  Kallie Locks, FNP  amLODipine (NORVASC) 10 MG tablet Take 1 tablet (10 mg total) by mouth daily. Patient not taking: No sig reported 06/16/20   Kallie Locks, FNP  amLODipine (NORVASC) 10 MG tablet TAKE 1 TABLET BY MOUTH DAILY Patient taking differently: Take 10 mg by mouth daily. 05/05/20 05/05/21  Kallie Locks, FNP  colchicine 0.6 MG tablet TAKE 1 TABLET (0.6 MG TOTAL) BY MOUTH EVERY 8 (EIGHT) HOURS AS NEEDED (FOR GOUTY ATTACKS). Patient taking differently: Take 0.6 mg by mouth every 6 (six) hours as needed (Gout attacks). 06/16/20 06/16/21  Kallie Locks, FNP  hydrochlorothiazide (HYDRODIURIL) 25 MG tablet TAKE 1 TABLET (25 MG TOTAL) BY MOUTH DAILY. Patient taking differently: Take 25 mg by mouth daily. 06/16/20  06/16/21  Kallie Locks, FNP  ibuprofen (ADVIL) 600 MG tablet TAKE 1 TABLET BY MOUTH EVERY 6 HOURS AS NEEDED. Patient not taking: Reported on 11/16/2020 07/25/20 07/25/21  Lorelee New, PA-C  methocarbamol (ROBAXIN) 500 MG tablet TAKE 1 TABLET (500 MG TOTAL) BY MOUTH 2 (TWO) TIMES DAILY. Patient not taking: Reported on 11/16/2020 09/27/20 09/27/21  Gailen Shelter, PA  oxyCODONE-acetaminophen (PERCOCET/ROXICET) 5-325 MG tablet Take 1 tablet by mouth every 6 (six) hours as needed for severe pain. 12/07/20   Margarita Grizzle, MD  oxyCODONE-acetaminophen (PERCOCET/ROXICET) 5-325 MG tablet Take 1 tablet by mouth every 8 (eight) hours as needed for severe pain. 03/05/21   Wynetta Fines, MD  potassium chloride SA (KLOR-CON) 20 MEQ tablet TAKE 1 TABLET (20 MEQ TOTAL) BY MOUTH DAILY. Patient not taking: Reported on 11/16/2020 08/16/20 08/16/21  Muthersbaugh, Dahlia Client, PA-C  predniSONE (DELTASONE) 10 MG tablet Take 2 tablets (20 mg total) by mouth daily. 03/05/21   Wynetta Fines, MD    Allergies    Patient has no known allergies.  Review of Systems   Review of Systems  All other systems reviewed and are negative.  Physical Exam Updated Vital Signs There were no vitals taken for this visit.  Physical Exam Vitals and nursing note reviewed.  Constitutional:      General: She is not in acute distress.    Appearance: She is well-developed.  HENT:     Head: Atraumatic.  Eyes:     Conjunctiva/sclera: Conjunctivae normal.  Cardiovascular:     Rate and Rhythm: Normal rate and regular rhythm.     Pulses: Normal pulses.     Heart sounds: No murmur heard. Pulmonary:     Effort: Pulmonary effort is normal.     Breath sounds: Normal breath sounds.  Abdominal:     Palpations: Abdomen is soft.  Musculoskeletal:        General: Tenderness (Left lower extremity: Exquisite tenderness to palpation throughout the left hip left calf and left foot with moderate edema appreciated.  Leg compartments soft, pedal  pulse palpable.  No deformity.  Sensation intact.  Tenderness to right fourth digit.) present.     Cervical back: Neck supple.  Skin:    Findings: No rash.  Neurological:     Mental Status: She is alert. Mental status is at baseline.  Psychiatric:        Mood and Affect: Mood normal.    ED Results / Procedures / Treatments   Labs (all labs ordered are listed, but only abnormal results are displayed) Labs Reviewed - No data to display  EKG None  Radiology No results found.  Procedures  Procedures   Medications Ordered in ED Medications - No data to display  ED Course  I have reviewed the triage vital signs and the nursing notes.  Pertinent labs & imaging results that were available during my care of the patient were reviewed by me and considered in my medical decision making (see chart for details).    MDM Rules/Calculators/A&P                           BP 126/89 (BP Location: Right Arm)   Pulse 95   Temp 99.2 F (37.3 C) (Oral)   Resp 16   Ht 5\' 4"  (1.626 m)   Wt 91.2 kg   SpO2 100%   BMI 34.50 kg/m   Final Clinical Impression(s) / ED Diagnoses Final diagnoses:  Polyarthralgia  Generalized pain    Rx / DC Orders ED Discharge Orders          Ordered    colchicine 0.6 MG tablet        03/07/21 1856    ibuprofen (ADVIL) 600 MG tablet  Every 6 hours PRN        03/07/21 1856           3:18 PM Patient report history of gout and is now complaining of pain to her left hip and left leg that felt similar to prior gouty flare.  Was seen 2 days ago for her complaint and was given prednisone and Percocet.  Report medication did not adequate relief.  On exam she does have exquisite tenderness and edema throughout left lower extremity.  Other this could be gout treatment due to the significant involvement of multiple joints, will obtain labs, and will obtain DVT study.  Pain medication given.  6:51 PM Fortunately DVT study is negative.  Labs remarkable for an  elevated white count of 14.1 as well as elevated CRP of 20.1 and elevated sed rate of 68.  I suspect all of this is likely secondary to inflammatory state.  Mild AKI noted with a creatinine of 1.7 increased from prior.  Rheumatoid factor is currently pending.  I did discuss this finding with Dr. 03/09/21 who has seen and evaluated patient.  We felt that her symptoms likely secondary to gout.  Low suspicion for septic arthritis involving multiple joints.  She is without any history of IV drug use concerning risk factors.  Her pain is currently controlled.  Crutches provided for ambulation.  Colchicine prescribed along with additional pain management.  Outpatient follow-up recommended.  Return precaution given.   Particia Nearing, PA-C 03/07/21 03/09/21, MD 03/07/21 03/09/21    Willette Alma, MD 03/07/21 [phone]

## 2021-03-07 NOTE — Progress Notes (Signed)
Lower extremity venous has been completed.   Preliminary results in CV Proc.   Suzanne Graham 03/07/2021 3:51 PM

## 2021-03-07 NOTE — Discharge Instructions (Addendum)
Your pain is likely due to gout.  Please take medication prescribed and follow-up closely with your doctor.  Use crutches as needed to help with ambulation.  A rheumatoid factor test have been ordered, results can be checked through MyChart, link below.  Have your doctor follow-up on the results.  Return if you have any concern.

## 2021-03-07 NOTE — ED Triage Notes (Signed)
Pt from home with ems c.o gout flare up. Seen here 2 days ago for the same.

## 2021-03-07 NOTE — ED Notes (Signed)
Patient transported to [facility] 

## 2021-03-07 NOTE — ED Notes (Signed)
Pt assisted to wheelchair d/t difficulty bearing weight.

## 2021-03-08 ENCOUNTER — Other Ambulatory Visit: Payer: Self-pay

## 2021-03-08 ENCOUNTER — Telehealth: Payer: Self-pay

## 2021-03-08 LAB — RHEUMATOID FACTOR: Rheumatoid fact SerPl-aCnc: 20.9 IU/mL — ABNORMAL HIGH (ref <14.0)

## 2021-03-08 NOTE — Telephone Encounter (Signed)
Patient's mother, Suzanne Graham was at [facility] today picking up medication for patient and inquiring about help for patient at home.  She explained that her daughter is not able to get out of be now due to her gout. She is not able to walk due to pain.  She is seeking help for her daughter - DME, personal care assistance, transportation.  She is currently only able to get out of the house with EMS.   Explained to her mother that she will need to be assessed by a provider in order to refer patient for some of these services. Her mother also stated that she has been denied Medicaid.  Informed her mother that consent will need to be obtained from patient in order to schedule a visit.   Call placed to patient and she was in agreement to scheduling a virtual visit tomorrow - 03/09/2021 with Eleonore Chiquito, FNP [facility].  Marland Kitchen

## 2021-03-09 ENCOUNTER — Other Ambulatory Visit: Payer: Self-pay

## 2021-03-09 ENCOUNTER — Encounter: Payer: Self-pay | Admitting: Family

## 2021-03-09 ENCOUNTER — Ambulatory Visit: Payer: Self-pay | Attending: Family | Admitting: Family

## 2021-03-09 VITALS — Ht 64.0 in | Wt 201.0 lb

## 2021-03-09 DIAGNOSIS — M1A0790 Idiopathic chronic gout, unspecified ankle and foot, without tophus (tophi): Secondary | ICD-10-CM

## 2021-03-09 DIAGNOSIS — M2550 Pain in unspecified joint: Secondary | ICD-10-CM

## 2021-03-09 NOTE — Progress Notes (Signed)
PT states all joints swollen

## 2021-03-09 NOTE — Progress Notes (Signed)
Suzanne Graham, is a 40 y.o. female  WEX:[id number]  LFY:[id number]  DOB - June 30, 1981  Subjective:  -Agreed to the visit mode -Patient was at home -Provider was at the office -The visit lasted 10 minutes    Chief Complaint and HPI: Suzanne Graham is a 40 y.o. female who was visited via the telephone mode for generalized joint pain.  [name] emergency room on March 07, 2021 and was discharged home with ibuprofen, per patient.  Patient says that still has pain although she is using her ibuprofen.  Endorses drinking beer occasionally.  Denies chest pain, shortness of breath, or any other symptom than pain to her joints.   ED/Hospital notes reviewed.    ROS:   Constitutional:  No f/c, No night sweats, No unexplained weight loss. EENT:  No vision changes, No blurry vision, No hearing changes. No mouth, throat, or ear problems.  Respiratory: No cough, No SOB Cardiac: No CP, no palpitations Musculoskeletal: Generalized joints pain  neuro: No headache, no dizziness, no motor weakness.  Skin: No rash Endocrine:  No polydipsia. No polyuria.    No problems updated.  ALLERGIES: No Known Allergies  PAST MEDICAL HISTORY: Past Medical History:  Diagnosis Date   Acid reflux 08/2019   Asthma    Depression    Gout    Hypertension    Stroke [facility])     MEDICATIONS AT HOME: Prior to Admission medications   Medication Sig Start Date End Date Taking? Authorizing Provider  albuterol (VENTOLIN HFA) 108 (90 Base) MCG/ACT inhaler INHALE 2 PUFFS INTO THE LUNGS EVERY 4 (FOUR) HOURS AS NEEDED FOR WHEEZING OR SHORTNESS OF BREATH. 06/15/20  Yes Kallie Locks, FNP  allopurinol (ZYLOPRIM) 100 MG tablet Take 1 tablet (100 mg total) by mouth daily. 06/16/20 06/11/21 Yes Kallie Locks, FNP  amLODipine (NORVASC) 10 MG tablet Take 1 tablet (10 mg total) by mouth daily. 06/16/20  Yes Kallie Locks, FNP  amLODipine (NORVASC) 10 MG tablet TAKE 1 TABLET BY MOUTH DAILY Patient taking  differently: Take 10 mg by mouth daily. 05/05/20 05/05/21 Yes Kallie Locks, FNP  calcium carbonate (TUMS EX) 750 MG chewable tablet Chew 2 tablets by mouth as needed for heartburn (heartburn).   Yes [provider]  colchicine 0.6 MG tablet TAKE 1 TABLET (0.6 MG TOTAL) BY MOUTH EVERY 8 (EIGHT) HOURS AS NEEDED (FOR GOUTY ATTACKS). 03/07/21 03/07/22 Yes Fayrene Helper, PA-C  hydrochlorothiazide (HYDRODIURIL) 25 MG tablet TAKE 1 TABLET (25 MG TOTAL) BY MOUTH DAILY. Patient taking differently: Take 25 mg by mouth daily. 06/16/20 06/16/21 Yes Kallie Locks, FNP  ibuprofen (ADVIL) 600 MG tablet Take 1 tablet (600 mg total) by mouth every 6 (six) hours as needed for moderate pain. 03/07/21 03/07/22 Yes Fayrene Helper, PA-C  oxyCODONE-acetaminophen (PERCOCET/ROXICET) 5-325 MG tablet Take 1 tablet by mouth every 8 (eight) hours as needed for severe pain. 03/05/21  Yes Wynetta Fines, MD  potassium chloride SA (KLOR-CON) 20 MEQ tablet TAKE 1 TABLET (20 MEQ TOTAL) BY MOUTH DAILY. 08/16/20 08/16/21 Yes Muthersbaugh, Dahlia Client, PA-C  predniSONE (DELTASONE) 10 MG tablet Take 2 tablets (20 mg total) by mouth daily. 03/05/21  Yes Wynetta Fines, MD     Objective:  EXAM:   Vitals:   03/09/21 0900  Weight: 201 lb (91.2 kg)  Height: 5\' 4"  (1.626 m)    Physical examination deferred due to the nature of the visit.   data Review Lab Results  Component Value Date   HGBA1C  5.0 11/15/2019   HGBA1C 5.0 11/15/2019   HGBA1C 5.0 (A) 11/15/2019   HGBA1C 5.0 11/15/2019     Assessment & Plan   1. Polyarthralgia -Continue taking ibuprofen as needed as prescribed - Ambulatory referral to Rheumatology  2. Chronic gout of foot, unspecified cause, unspecified laterality -Continue taking all the medication as prescribed Report new or worsening symptoms to the clinic or local ER -Quit drinking beer - Ambulatory referral to Rheumatology     Patient have been counseled extensively about nutrition and  exercise  No follow-ups on file.  The patient was given clear instructions to go to ER or return to medical center if symptoms don't improve, worsen or new problems develop. The patient verbalized understanding. The patient was told to call to get lab results if they haven't heard anything in the next week.     Eleonore Chiquito, APRN, FNP-C [facility] [place], [place] [phone]   03/09/2021, 9:59 AM

## 2021-03-10 ENCOUNTER — Encounter (HOSPITAL_COMMUNITY): Payer: Self-pay

## 2021-03-10 ENCOUNTER — Emergency Department (HOSPITAL_COMMUNITY): Payer: Medicaid Other

## 2021-03-10 ENCOUNTER — Observation Stay (HOSPITAL_COMMUNITY)
Admission: EM | Admit: 2021-03-10 | Discharge: 2021-03-12 | Disposition: A | Discharge status: Home / Self Care | Payer: Medicaid Other | Attending: Family Medicine | Admitting: Family Medicine

## 2021-03-10 DIAGNOSIS — I10 Essential (primary) hypertension: Secondary | ICD-10-CM | POA: Insufficient documentation

## 2021-03-10 DIAGNOSIS — Z79899 Other long term (current) drug therapy: Secondary | ICD-10-CM | POA: Diagnosis not present

## 2021-03-10 DIAGNOSIS — F17210 Nicotine dependence, cigarettes, uncomplicated: Secondary | ICD-10-CM | POA: Diagnosis not present

## 2021-03-10 DIAGNOSIS — M10471 Other secondary gout, right ankle and foot: Secondary | ICD-10-CM

## 2021-03-10 DIAGNOSIS — M1A0790 Idiopathic chronic gout, unspecified ankle and foot, without tophus (tophi): Secondary | ICD-10-CM | POA: Diagnosis present

## 2021-03-10 DIAGNOSIS — J45909 Unspecified asthma, uncomplicated: Secondary | ICD-10-CM | POA: Insufficient documentation

## 2021-03-10 DIAGNOSIS — N179 Acute kidney failure, unspecified: Secondary | ICD-10-CM | POA: Diagnosis not present

## 2021-03-10 DIAGNOSIS — M25511 Pain in right shoulder: Secondary | ICD-10-CM | POA: Insufficient documentation

## 2021-03-10 DIAGNOSIS — Z8673 Personal history of transient ischemic attack (TIA), and cerebral infarction without residual deficits: Secondary | ICD-10-CM | POA: Diagnosis not present

## 2021-03-10 DIAGNOSIS — M109 Gout, unspecified: Secondary | ICD-10-CM | POA: Diagnosis present

## 2021-03-10 DIAGNOSIS — U071 COVID-19: Secondary | ICD-10-CM | POA: Insufficient documentation

## 2021-03-10 DIAGNOSIS — T39395A Adverse effect of other nonsteroidal anti-inflammatory drugs [NSAID], initial encounter: Secondary | ICD-10-CM | POA: Diagnosis present

## 2021-03-10 DIAGNOSIS — M25561 Pain in right knee: Secondary | ICD-10-CM | POA: Diagnosis present

## 2021-03-10 LAB — COMPREHENSIVE METABOLIC PANEL
ALT: 25 U/L (ref 0–44)
AST: 25 U/L (ref 15–41)
Albumin: 2.4 g/dL — ABNORMAL LOW (ref 3.5–5.0)
Alkaline Phosphatase: 123 U/L (ref 38–126)
Anion gap: 19 — ABNORMAL HIGH (ref 5–15)
BUN: 44 mg/dL — ABNORMAL HIGH (ref 6–20)
CO2: 21 mmol/L — ABNORMAL LOW (ref 22–32)
Calcium: 9.6 mg/dL (ref 8.9–10.3)
Chloride: 98 mmol/L (ref 98–111)
Creatinine, Ser: 2.88 mg/dL — ABNORMAL HIGH (ref 0.44–1.00)
GFR, Estimated: 20 mL/min — ABNORMAL LOW (ref >60)
Glucose, Bld: 70 mg/dL (ref 70–99)
Potassium: 2.9 mmol/L — ABNORMAL LOW (ref 3.5–5.1)
Sodium: 138 mmol/L (ref 135–145)
Total Bilirubin: 1.4 mg/dL — ABNORMAL HIGH (ref 0.3–1.2)
Total Protein: 7.5 g/dL (ref 6.5–8.1)

## 2021-03-10 LAB — CBC WITH DIFFERENTIAL/PLATELET
Abs Immature Granulocytes: 0.19 10*3/uL — ABNORMAL HIGH (ref 0.00–0.07)
Basophils Absolute: 0 10*3/uL (ref 0.0–0.1)
Basophils Relative: 0 %
Eosinophils Absolute: 0.2 10*3/uL (ref 0.0–0.5)
Eosinophils Relative: 2 %
HCT: 34.7 % — ABNORMAL LOW (ref 36.0–46.0)
Hemoglobin: 11.2 g/dL — ABNORMAL LOW (ref 12.0–15.0)
Immature Granulocytes: 2 %
Lymphocytes Relative: 12 %
Lymphs Abs: 1.4 10*3/uL (ref 0.7–4.0)
MCH: 25.1 pg — ABNORMAL LOW (ref 26.0–34.0)
MCHC: 32.3 g/dL (ref 30.0–36.0)
MCV: 77.8 fL — ABNORMAL LOW (ref 80.0–100.0)
Monocytes Absolute: 1.4 10*3/uL — ABNORMAL HIGH (ref 0.1–1.0)
Monocytes Relative: 12 %
Neutro Abs: 8.8 10*3/uL — ABNORMAL HIGH (ref 1.7–7.7)
Neutrophils Relative %: 72 %
Platelets: 372 10*3/uL (ref 150–400)
RBC: 4.46 MIL/uL (ref 3.87–5.11)
RDW: 14.4 % (ref 11.5–15.5)
WBC: 12 10*3/uL — ABNORMAL HIGH (ref 4.0–10.5)
nRBC: 0 % (ref 0.0–0.2)

## 2021-03-10 LAB — C-REACTIVE PROTEIN: CRP: 33.7 mg/dL — ABNORMAL HIGH (ref <1.0)

## 2021-03-10 LAB — SEDIMENTATION RATE: Sed Rate: 106 mm/hr — ABNORMAL HIGH (ref 0–22)

## 2021-03-10 MED ORDER — SODIUM CHLORIDE 0.9 % IV BOLUS
500.0000 mL | Freq: Once | INTRAVENOUS | Status: AC
  Administered 2021-03-10: 500 mL via INTRAVENOUS

## 2021-03-10 MED ORDER — LIDOCAINE-EPINEPHRINE 1 %-1:100000 IJ SOLN
20.0000 mL | Freq: Once | INTRAMUSCULAR | Status: AC
  Administered 2021-03-10: 20 mL
  Filled 2021-03-10: qty 1

## 2021-03-10 MED ORDER — POTASSIUM CHLORIDE CRYS ER 20 MEQ PO TBCR
40.0000 meq | EXTENDED_RELEASE_TABLET | Freq: Once | ORAL | Status: AC
  Administered 2021-03-11: 40 meq via ORAL
  Filled 2021-03-10: qty 2

## 2021-03-10 MED ORDER — HYDROCODONE-ACETAMINOPHEN 5-325 MG PO TABS
1.0000 | ORAL_TABLET | Freq: Once | ORAL | Status: AC
  Administered 2021-03-10: 1 via ORAL
  Filled 2021-03-10: qty 1

## 2021-03-10 NOTE — ED Triage Notes (Signed)
Pt BIB [facility] for eval of blt foot/ankle pain R>L. Seen here on 8/29 and 8/31 for same. Pt w/ no new complaints however requesting xray

## 2021-03-10 NOTE — ED Notes (Signed)
Pt could not bear weight on feet at all due to swelling r/t gout as endorsed by pt.

## 2021-03-10 NOTE — ED Provider Notes (Signed)
[facility] EMERGENCY DEPARTMENT Provider Note   CSN: [id number] Arrival date & time: 03/10/21  1458     History Chief Complaint  Patient presents with   Knee Pain    Suzanne Graham is a 40 y.o. female with history of chronic gout presents to the emergency department today for right knee and left shoulder pain that began 3 days ago.  She is seen and evaluated in the emergency department a couple times in the last week for gout flares.  She was placed on 5 days of prednisone and given hydrocodone for pain.  Prednisone did not seem to help.  Hydrocodone helped with the pain.  He is complaining of right knee pain that is worse with ambulation and better with rest.  She rates it a 10 out of 10 in severity.  She reports associated swelling and warmth to the area.  Denies any fever, chills, hip pain, leg pain, or leg swelling.   The history is provided by the patient. No language interpreter was used.  Knee Pain     Past Medical History:  Diagnosis Date   Acid reflux 08/2019   Asthma    Depression    Gout    Hypertension    Stroke [facility])     Patient Active Problem List   Diagnosis Date Noted   Acute gout of knee 03/11/2021   Vitamin D deficiency 11/17/2019   Chronic gout of foot 10/11/2018   Current smoker 10/11/2018   Generalized pain 10/11/2018   Chronic nonintractable headache 08/06/2017   Essential hypertension 08/06/2017   Stroke (HCC) 07/17/2017    Past Surgical History:  Procedure Laterality Date   LOOP RECORDER INSERTION N/A 08/19/2017   Procedure: LOOP RECORDER INSERTION;  Surgeon: Hillis Range, MD;  Location: [facility] INVASIVE CV LAB;  Service: Cardiovascular;  Laterality: N/A;   TEE WITHOUT CARDIOVERSION N/A 08/19/2017   Procedure: TRANSESOPHAGEAL ECHOCARDIOGRAM (TEE);  Surgeon: Pricilla Riffle, MD;  Location: [facility] ENDOSCOPY;  Service: Cardiovascular;  Laterality: N/A;     OB History     Gravida  0   Para  0   Term  0   Preterm  0   AB  0    Living  0      SAB  0   IAB  0   Ectopic  0   Multiple  0   Live Births  0           Family History  Problem Relation Age of Onset   Heart failure Mother    Hypertension Mother    Stroke Neg Hx     Social History   Tobacco Use   Smoking status: Every Day    Packs/day: 0.25    Types: Cigarettes   Smokeless tobacco: Never  Vaping Use   Vaping Use: Never used  Substance Use Topics   Alcohol use: Yes    Comment: rare   Drug use: No    Home Medications Prior to Admission medications   Medication Sig Start Date End Date Taking? Authorizing Provider  acetaminophen (TYLENOL) 500 MG tablet Take 500 mg by mouth every 6 (six) hours as needed for moderate pain or headache.   Yes [provider]  albuterol (VENTOLIN HFA) 108 (90 Base) MCG/ACT inhaler INHALE 2 PUFFS INTO THE LUNGS EVERY 4 (FOUR) HOURS AS NEEDED FOR WHEEZING OR SHORTNESS OF BREATH. 06/15/20  Yes Kallie Locks, FNP  amLODipine (NORVASC) 10 MG tablet TAKE 1 TABLET BY MOUTH DAILY Patient  taking differently: Take 10 mg by mouth daily. 05/05/20 05/05/21 Yes Kallie LocksStroud, Natalie M, FNP  colchicine 0.6 MG tablet TAKE 1 TABLET (0.6 MG TOTAL) BY MOUTH EVERY 8 (EIGHT) HOURS AS NEEDED (FOR GOUTY ATTACKS). Patient taking differently: Take 0.6 mg by mouth every 8 (eight) hours as needed (gout flares). 03/07/21 03/07/22 Yes Fayrene Helperran, Bowie, PA-C  hydrochlorothiazide (HYDRODIURIL) 25 MG tablet TAKE 1 TABLET (25 MG TOTAL) BY MOUTH DAILY. Patient taking differently: Take 25 mg by mouth daily. 06/16/20 06/16/21 Yes Kallie LocksStroud, Natalie M, FNP  ibuprofen (ADVIL) 200 MG tablet Take 600 mg by mouth every 6 (six) hours as needed for headache or moderate pain.   Yes [provider]  ibuprofen (ADVIL) 600 MG tablet Take 1 tablet (600 mg total) by mouth every 6 (six) hours as needed for moderate pain. 03/07/21 03/07/22 Yes Fayrene Helperran, Bowie, PA-C  oxyCODONE-acetaminophen (PERCOCET/ROXICET) 5-325 MG tablet Take 1 tablet by mouth every 8  (eight) hours as needed for severe pain. 03/05/21  Yes Wynetta FinesMessick, Peter C, MD  allopurinol (ZYLOPRIM) 100 MG tablet Take 1 tablet (100 mg total) by mouth daily. Patient not taking: Reported on 03/10/2021 06/16/20 06/11/21  Kallie LocksStroud, Natalie M, FNP  calcium carbonate (TUMS EX) 750 MG chewable tablet Chew 2 tablets by mouth as needed for heartburn (heartburn).    [provider]  potassium chloride SA (KLOR-CON) 20 MEQ tablet TAKE 1 TABLET (20 MEQ TOTAL) BY MOUTH DAILY. Patient not taking: Reported on 03/10/2021 08/16/20 08/16/21  Muthersbaugh, Dahlia ClientHannah, PA-C  predniSONE (DELTASONE) 10 MG tablet Take 2 tablets (20 mg total) by mouth daily. Patient not taking: Reported on 03/10/2021 03/05/21   Wynetta FinesMessick, Peter C, MD    Allergies    Patient has no known allergies.  Review of Systems   Review of Systems  All other systems reviewed and are negative.  Physical Exam Updated Vital Signs BP (!) 136/97 (BP Location: Right Arm)   Pulse 80   Temp 98.3 F (36.8 C) (Oral)   Resp 18   Ht 5\' 4"  (1.626 m)   Wt 92 kg   SpO2 100%   BMI 34.81 kg/m   Physical Exam Vitals reviewed.  Constitutional:      Appearance: Normal appearance.  HENT:     Head: Normocephalic and atraumatic.  Eyes:     General:        Right eye: No discharge.        Left eye: No discharge.     Conjunctiva/sclera: Conjunctivae normal.  Pulmonary:     Effort: Pulmonary effort is normal.  Musculoskeletal:     Comments: Right medial tenderness with mild warmth. Mild amount of swelling. She is able to flex the knee to 90 degrees. Can full extend the right knee. No erythema. No lateral tenderness or swelling.   Skin:    General: Skin is warm and dry.     Findings: No rash.  Neurological:     General: No focal deficit present.     Mental Status: She is alert.  Psychiatric:        Mood and Affect: Mood normal.        Behavior: Behavior normal.    ED Results / Procedures / Treatments   Labs (all labs ordered are listed, but only  abnormal results are displayed) Labs Reviewed  SYNOVIAL CELL COUNT + DIFF, W/ CRYSTALS - Abnormal; Notable for the following components:      Result Value   Appearance-Synovial TURBID (*)    WBC, Synovial 45,885 (*)  Neutrophil, Synovial 96 (*)    Monocyte-Macrophage-Synovial Fluid 0 (*)    All other components within normal limits  CBC WITH DIFFERENTIAL/PLATELET - Abnormal; Notable for the following components:   WBC 12.0 (*)    Hemoglobin 11.2 (*)    HCT 34.7 (*)    MCV 77.8 (*)    MCH 25.1 (*)    Neutro Abs 8.8 (*)    Monocytes Absolute 1.4 (*)    Abs Immature Granulocytes 0.19 (*)    All other components within normal limits  COMPREHENSIVE METABOLIC PANEL - Abnormal; Notable for the following components:   Potassium 2.9 (*)    CO2 21 (*)    BUN 44 (*)    Creatinine, Ser 2.88 (*)    Albumin 2.4 (*)    Total Bilirubin 1.4 (*)    GFR, Estimated 20 (*)    Anion gap 19 (*)    All other components within normal limits  SEDIMENTATION RATE - Abnormal; Notable for the following components:   Sed Rate 106 (*)    All other components within normal limits  C-REACTIVE PROTEIN - Abnormal; Notable for the following components:   CRP 33.7 (*)    All other components within normal limits  BODY FLUID CULTURE W GRAM STAIN  RESP PANEL BY RT-PCR (FLU A&B, COVID) ARPGX2  GLUCOSE, BODY FLUID OTHER            PROTEIN, BODY FLUID (OTHER)  URIC ACID, BODY FLUID    EKG None  Radiology DG Shoulder Left  Result Date: 03/10/2021 CLINICAL DATA:  Patient complaining of pain in left shoulder and right knee. Pt has bilateral leg swelling. EXAM: LEFT SHOULDER - 2+ VIEW COMPARISON:  None. FINDINGS: There is no evidence of fracture or dislocation. There is no evidence of arthropathy or other focal bone abnormality. Soft tissues are unremarkable. IMPRESSION: Negative. Electronically Signed   By: Emmaline Kluver M.D.   On: 03/10/2021 16:35   DG Knee Complete 4 Views Right  Result Date:  03/10/2021 CLINICAL DATA:  Pt c/o pain in left shoulder and right knee. Pt has bilateral leg swelling. EXAM: RIGHT KNEE - COMPLETE 4+ VIEW COMPARISON:  None. FINDINGS: There is a small joint effusion. No evidence of acute fracture or dislocation. Mild tricompartmental osteoarthritis. Regional soft tissues are unremarkable. IMPRESSION: Small joint effusion.  No acute osseous abnormality. Electronically Signed   By: Emmaline Kluver M.D.   On: 03/10/2021 16:37    Procedures .Joint Aspiration/Arthrocentesis  Date/Time: 03/10/2021 8:39 PM Performed by: Teressa Lower, PA-C Authorized by: Teressa Lower, PA-C   Consent:    Consent obtained:  Verbal   Consent given by:  Patient   Risks, benefits, and alternatives were discussed: yes     Risks discussed:  Bleeding, infection, pain and incomplete drainage Universal protocol:    Procedure explained and questions answered to patient or proxy's satisfaction: yes     Relevant documents present and verified: yes     Imaging studies available: yes     Site/side marked: yes     Immediately prior to procedure, a time out was called: no     Patient identity confirmed:  Verbally with patient and arm band Location:    Location:  Knee   Knee:  R knee Anesthesia:    Anesthesia method:  Local infiltration   Local anesthetic:  Lidocaine 2% WITH epi Procedure details:    Needle gauge:  18 G   Ultrasound guidance: yes     Approach:  Medial   Aspirate amount:  34 cc   Aspirate characteristics:  Yellow and cloudy   Steroid injected: no     Specimen collected: yes   Post-procedure details:    Dressing:  Adhesive bandage   Procedure completion:  Tolerated well, no immediate complications   Medications Ordered in ED Medications  amLODipine (NORVASC) tablet 10 mg (has no administration in time range)  albuterol (PROVENTIL) (2.5 MG/3ML) 0.083% nebulizer solution 2.5 mg (has no administration in time range)  acetaminophen (TYLENOL) tablet 500 mg (has  no administration in time range)  calcium carbonate (TUMS - dosed in mg elemental calcium) chewable tablet 400 mg of elemental calcium (has no administration in time range)  HYDROcodone-acetaminophen (NORCO/VICODIN) 5-325 MG per tablet 1 tablet (1 tablet Oral Given 03/10/21 1927)  lidocaine-EPINEPHrine (XYLOCAINE W/EPI) 1 %-1:100000 (with pres) injection 20 mL (20 mLs Infiltration Given by Other 03/10/21 2031)  sodium chloride 0.9 % bolus 500 mL (0 mLs Intravenous Stopped 03/11/21 0026)  potassium chloride SA (KLOR-CON) CR tablet 40 mEq (40 mEq Oral Given 03/11/21 0026)  sodium chloride 0.9 % bolus 500 mL (500 mLs Intravenous New Bag/Given 03/11/21 0045)    ED Course  I have reviewed the triage vital signs and the nursing notes.  Pertinent labs & imaging results that were available during my care of the patient were reviewed by me and considered in my medical decision making (see chart for details).  Clinical Course as of 03/11/21 0056  Sat Mar 10, 2021  2200 Creatinine(!): 2.88 [CF]  2200 Noted to be elevated.  This is up from 1.7 on most recent lab work.  BUN also elevated.  Will give 500 normal saline. [CF]    Clinical Course User Index [CF] Teressa Lower, PA-C   MDM Rules/Calculators/A&P                         Suzanne Graham is 40 y.o woman with history of chronic gout flares who presented to the emergency department for further evaluation of right knee pain. Given the clinical scenario, I was concerned for possible septic joint given that the knee was warm to palpation, tender, with limited range of motion.  Arthrocentesis was performed.  Please see procedure note above.  I have a low suspicion for fracture, dislocation, or significant ligamentous injury.  Left shoulder is likely muscular in nature.  She had point tenderness over the left trapezius muscle.  CRP and sed rate were both elevated.  CMP revealed an AKI with a creatinine of 2.88.  CBC with mild leukocytosis.  She has been on a  5-day course of steroids during her last visit to the emergency department for gouty flare.  No real joint analysis revealed urate crystals, with 45,000 white blood cells.  Septic joint is less likely.  COVID is pending.  Given the clinical scenario, she will likely need to be admitted for new AKI. She has been taking ibuprofen daily in addition to prednisone which was prescribed during her last visit to the emergency department.  I gave her a 500 normal saline bolus. Pain was adequately controlled during her ED course. With regards to her gout, she has been on hydrochlorothiazide chronically for years.  I suspect that this is an exacerbating problem for her gout. Spoke with Lyda Perone, DO who will admit the patient to the hospitalist service.   Final Clinical Impression(s) / ED Diagnoses Final diagnoses:  Acute kidney injury (HCC)    Rx /  DC Orders ED Discharge Orders     None        Teressa Lower, [place] 03/11/21 [id number]    Ernie Avena, MD 03/11/21 1240

## 2021-03-10 NOTE — ED Provider Notes (Signed)
Emergency Medicine Provider Triage Evaluation Note  Suzanne Graham , a 40 y.o. female  was evaluated in triage.  Pt complains of right knee and left shoulder pain without injury, seen recently for same, requesting XR.  Review of Systems  Positive: Joint pain Negative: Swelling, numbness  Physical Exam  BP 111/80 (BP Location: Right Arm)   Pulse 97   Temp 98.8 F (37.1 C) (Oral)   Resp 16   Ht 5\' 4"  (1.626 m)   Wt 92 kg   SpO2 100%   BMI 34.81 kg/m  Gen:   Awake, no distress   Resp:  Normal effort  MSK:   Moves extremities without difficulty  Other:  Anterior right knee pain, no erythema. Generalized left shoulder pain, radial pulse present   Medical Decision Making  Medically screening exam initiated at 3:25 PM.  Appropriate orders placed.  Suzanne Graham was informed that the remainder of the evaluation will be completed by another provider, this initial triage assessment does not replace that evaluation, and the importance of remaining in the ED until their evaluation is complete.     [name], PA-C 03/10/21 1532    05/10/21, MD 03/11/21 1133

## 2021-03-10 NOTE — ED Notes (Signed)
Please ask pt to call her mother

## 2021-03-11 ENCOUNTER — Other Ambulatory Visit: Payer: Self-pay

## 2021-03-11 ENCOUNTER — Observation Stay (HOSPITAL_COMMUNITY): Payer: Medicaid Other

## 2021-03-11 DIAGNOSIS — N179 Acute kidney failure, unspecified: Principal | ICD-10-CM

## 2021-03-11 DIAGNOSIS — M1A0790 Idiopathic chronic gout, unspecified ankle and foot, without tophus (tophi): Secondary | ICD-10-CM

## 2021-03-11 DIAGNOSIS — T39395A Adverse effect of other nonsteroidal anti-inflammatory drugs [NSAID], initial encounter: Secondary | ICD-10-CM

## 2021-03-11 DIAGNOSIS — I10 Essential (primary) hypertension: Secondary | ICD-10-CM

## 2021-03-11 DIAGNOSIS — M109 Gout, unspecified: Secondary | ICD-10-CM

## 2021-03-11 LAB — BASIC METABOLIC PANEL
Anion gap: 16 — ABNORMAL HIGH (ref 5–15)
BUN: 45 mg/dL — ABNORMAL HIGH (ref 6–20)
CO2: 19 mmol/L — ABNORMAL LOW (ref 22–32)
Calcium: 8.9 mg/dL (ref 8.9–10.3)
Chloride: 102 mmol/L (ref 98–111)
Creatinine, Ser: 2.61 mg/dL — ABNORMAL HIGH (ref 0.44–1.00)
GFR, Estimated: 23 mL/min — ABNORMAL LOW (ref >60)
Glucose, Bld: 80 mg/dL (ref 70–99)
Potassium: 3.3 mmol/L — ABNORMAL LOW (ref 3.5–5.1)
Sodium: 137 mmol/L (ref 135–145)

## 2021-03-11 LAB — CBC
HCT: 30.8 % — ABNORMAL LOW (ref 36.0–46.0)
Hemoglobin: 10.1 g/dL — ABNORMAL LOW (ref 12.0–15.0)
MCH: 25 pg — ABNORMAL LOW (ref 26.0–34.0)
MCHC: 32.8 g/dL (ref 30.0–36.0)
MCV: 76.2 fL — ABNORMAL LOW (ref 80.0–100.0)
Platelets: 311 10*3/uL (ref 150–400)
RBC: 4.04 MIL/uL (ref 3.87–5.11)
RDW: 14.2 % (ref 11.5–15.5)
WBC: 9.8 10*3/uL (ref 4.0–10.5)
nRBC: 0 % (ref 0.0–0.2)

## 2021-03-11 LAB — SYNOVIAL CELL COUNT + DIFF, W/ CRYSTALS
Eosinophils-Synovial: 0 % (ref 0–1)
Lymphocytes-Synovial Fld: 4 % (ref 0–20)
Monocyte-Macrophage-Synovial Fluid: 0 % — ABNORMAL LOW (ref 50–90)
Neutrophil, Synovial: 96 % — ABNORMAL HIGH (ref 0–25)
WBC, Synovial: 45885 /mm3 — ABNORMAL HIGH (ref 0–200)

## 2021-03-11 LAB — HIV ANTIBODY (ROUTINE TESTING W REFLEX): HIV Screen 4th Generation wRfx: NONREACTIVE

## 2021-03-11 LAB — RESP PANEL BY RT-PCR (FLU A&B, COVID) ARPGX2
Influenza A by PCR: NEGATIVE
Influenza B by PCR: NEGATIVE
SARS Coronavirus 2 by RT PCR: POSITIVE — AB

## 2021-03-11 MED ORDER — HEPARIN SODIUM (PORCINE) 5000 UNIT/ML IJ SOLN
5000.0000 [IU] | Freq: Three times a day (TID) | INTRAMUSCULAR | Status: DC
  Administered 2021-03-11 – 2021-03-12 (×5): 5000 [IU] via SUBCUTANEOUS
  Filled 2021-03-11 (×5): qty 1

## 2021-03-11 MED ORDER — LACTATED RINGERS IV SOLN: INTRAVENOUS | Status: DC

## 2021-03-11 MED ORDER — ENSURE ENLIVE PO LIQD
237.0000 mL | Freq: Two times a day (BID) | ORAL | Status: DC
  Administered 2021-03-11 (×2): 237 mL via ORAL

## 2021-03-11 MED ORDER — SODIUM CHLORIDE 0.9 % IV BOLUS
500.0000 mL | Freq: Once | INTRAVENOUS | Status: AC
Start: 2021-03-11 — End: 2021-03-11
  Administered 2021-03-11: 500 mL via INTRAVENOUS

## 2021-03-11 MED ORDER — PREDNISONE 20 MG PO TABS
20.0000 mg | ORAL_TABLET | Freq: Every day | ORAL | Status: DC
  Filled 2021-03-11: qty 1

## 2021-03-11 MED ORDER — HYDROCODONE-ACETAMINOPHEN 5-325 MG PO TABS: 1.0000 | ORAL_TABLET | ORAL | Status: DC | PRN

## 2021-03-11 MED ORDER — HYDROCODONE-ACETAMINOPHEN 5-325 MG PO TABS
1.0000 | ORAL_TABLET | Freq: Four times a day (QID) | ORAL | Status: DC | PRN
  Administered 2021-03-11 (×3): 1 via ORAL
  Filled 2021-03-11 (×3): qty 1

## 2021-03-11 MED ORDER — CALCIUM CARBONATE ANTACID 500 MG PO CHEW
2.0000 | CHEWABLE_TABLET | ORAL | Status: DC | PRN
  Administered 2021-03-11: 400 mg via ORAL
  Filled 2021-03-11: qty 2

## 2021-03-11 MED ORDER — PREDNISONE 20 MG PO TABS
20.0000 mg | ORAL_TABLET | Freq: Every day | ORAL | Status: DC
  Administered 2021-03-11: 20 mg via ORAL
  Filled 2021-03-11: qty 1

## 2021-03-11 MED ORDER — HYDRALAZINE HCL 25 MG PO TABS
25.0000 mg | ORAL_TABLET | Freq: Four times a day (QID) | ORAL | Status: DC | PRN
  Administered 2021-03-12: 25 mg via ORAL
  Filled 2021-03-11: qty 1

## 2021-03-11 MED ORDER — SODIUM CHLORIDE 0.9 % IV SOLN: INTRAVENOUS | Status: DC

## 2021-03-11 MED ORDER — ACETAMINOPHEN 500 MG PO TABS: 500.0000 mg | ORAL_TABLET | Freq: Four times a day (QID) | ORAL | Status: DC | PRN

## 2021-03-11 MED ORDER — PREDNISONE 20 MG PO TABS
20.0000 mg | ORAL_TABLET | Freq: Two times a day (BID) | ORAL | Status: DC
  Administered 2021-03-11 – 2021-03-12 (×3): 20 mg via ORAL
  Filled 2021-03-11 (×3): qty 1

## 2021-03-11 MED ORDER — ALBUTEROL SULFATE (2.5 MG/3ML) 0.083% IN NEBU: 2.5000 mg | INHALATION_SOLUTION | RESPIRATORY_TRACT | Status: DC | PRN

## 2021-03-11 MED ORDER — PREDNISONE 20 MG PO TABS: 40.0000 mg | ORAL_TABLET | Freq: Every day | ORAL | Status: DC

## 2021-03-11 MED ORDER — POTASSIUM CHLORIDE CRYS ER 20 MEQ PO TBCR
40.0000 meq | EXTENDED_RELEASE_TABLET | Freq: Once | ORAL | Status: AC
  Administered 2021-03-11: 40 meq via ORAL
  Filled 2021-03-11: qty 2

## 2021-03-11 MED ORDER — POTASSIUM CHLORIDE IN NACL 20-0.9 MEQ/L-% IV SOLN
INTRAVENOUS | Status: DC
  Filled 2021-03-11 (×3): qty 1000

## 2021-03-11 MED ORDER — AMLODIPINE BESYLATE 10 MG PO TABS
10.0000 mg | ORAL_TABLET | Freq: Every day | ORAL | Status: DC
  Administered 2021-03-11 – 2021-03-12 (×2): 10 mg via ORAL
  Filled 2021-03-11 (×3): qty 1

## 2021-03-11 NOTE — Progress Notes (Signed)
   03/11/21 0300  Notify: Provider  Provider Name/Title Linton Flemings Np  Date Provider Notified 03/11/21  Time Provider Notified 0300  Notification Type  (amion)  Notification Reason Other (Comment) (Covid + BP 140/109)  Provider response Other (Comment) (aware no new orders)  Date of Provider Response 03/11/21  Time of Provider Response [phone]

## 2021-03-11 NOTE — Plan of Care (Signed)

## 2021-03-11 NOTE — Progress Notes (Signed)
This is a 40 year old lady with a history of GERD and hypertension who was admitted early this morning with right knee gout as well as AKI which was likely secondary to NSAID use.  Patient seen and examined.  She feels better.  Her knee pain is completely resolved.  She has no other complaint.  Fluid analysis has 45,000 white blood cells as well as monosodium urate, confirming diagnosis of gout.  She is on prednisone 20 mg p.o. daily.  That is suboptimal dose.  Will increase to 20 mg twice daily.  Will await for gram stain to rule out septic arthritis.  No fever.  Did have some leukocytosis yesterday but no leukocytosis today despite of being off of antibiotics.  Blood pressure slightly elevated.  Resume amlodipine.  Add as needed hydralazine.

## 2021-03-11 NOTE — ED Notes (Signed)
RN attempted to call report to [phone] 

## 2021-03-11 NOTE — H&P (Signed)
History and Physical    Suzanne Graham WUJ:[id number] DOB: 07/30/1980 DOA: 03/10/2021  PCP: Kallie LocksStroud, Natalie M, FNP (Inactive)  Patient coming from: Home  I have personally briefly reviewed [facility]  Chief Complaint: Gout  HPI: Suzanne Graham is a 40 y.o. female with medical history significant of Gout, GERD, HTN.  Pt in to ED with concerns of ongoing / worsening gout flare.  Pt initially treated in ED on 8/29 for gout.  Started on Prednisone, oxy, ibuprofen.  Seen back in ED on 8/31.  Noted to have mild AKI at that time.  Despite this, ibuprofen was continued unfortunately.  Now back in ED on 9/3 with ongoing knee pain, severe, not helped by ibuprofen.  Was helped for a short while by prednisone but script of prednisone ran out.  No fevers, chills.   ED Course: Creat now 2.88 up from 1.7 on 8/31 and 1.28 baseline.  Tap of knee confirms intracellular monosodium urate crystals.  Hospitalist asked to admit for AKI.   Review of Systems: As per HPI, otherwise all review of systems negative.  Past Medical History:  Diagnosis Date   Acid reflux 08/2019   Asthma    Depression    Gout    Hypertension    Stroke [facility](HCC)     Past Surgical History:  Procedure Laterality Date   LOOP RECORDER INSERTION N/A 08/19/2017   Procedure: LOOP RECORDER INSERTION;  Surgeon: Hillis RangeAllred, James, MD;  Location: [facility] INVASIVE CV LAB;  Service: Cardiovascular;  Laterality: N/A;   TEE WITHOUT CARDIOVERSION N/A 08/19/2017   Procedure: TRANSESOPHAGEAL ECHOCARDIOGRAM (TEE);  Surgeon: Pricilla Riffleoss, Paula V, MD;  [facility];  Service: Cardiovascular;  Laterality: N/A;     reports that she has been smoking cigarettes. She has been smoking an average of .25 packs per day. She has never used smokeless tobacco. She reports current alcohol use. She reports that she does not use drugs.  Allergies  Allergen Reactions   Nsaids Other (See Comments)    Likely causing AKI     Family History  Problem Relation Age of Onset   Heart failure Mother    Hypertension Mother    Stroke Neg Hx      Prior to Admission medications   Medication Sig Start Date End Date Taking? Authorizing Provider  acetaminophen (TYLENOL) 500 MG tablet Take 500 mg by mouth every 6 (six) hours as needed for moderate pain or headache.   Yes [provider]  albuterol (VENTOLIN HFA) 108 (90 Base) MCG/ACT inhaler INHALE 2 PUFFS INTO THE LUNGS EVERY 4 (FOUR) HOURS AS NEEDED FOR WHEEZING OR SHORTNESS OF BREATH. 06/15/20  Yes Kallie LocksStroud, Natalie M, FNP  amLODipine (NORVASC) 10 MG tablet TAKE 1 TABLET BY MOUTH DAILY Patient taking differently: Take 10 mg by mouth daily. 05/05/20 05/05/21 Yes Kallie LocksStroud, Natalie M, FNP  colchicine 0.6 MG tablet TAKE 1 TABLET (0.6 MG TOTAL) BY MOUTH EVERY 8 (EIGHT) HOURS AS NEEDED (FOR GOUTY ATTACKS). Patient taking differently: Take 0.6 mg by mouth every 8 (eight) hours as needed (gout flares). 03/07/21 03/07/22 Yes Fayrene Helperran, Bowie, PA-C  hydrochlorothiazide (HYDRODIURIL) 25 MG tablet TAKE 1 TABLET (25 MG TOTAL) BY MOUTH DAILY. Patient taking differently: Take 25 mg by mouth daily. 06/16/20 06/16/21 Yes Kallie LocksStroud, Natalie M, FNP  oxyCODONE-acetaminophen (PERCOCET/ROXICET) 5-325 MG tablet Take 1 tablet by mouth every 8 (eight) hours as needed for severe pain. 03/05/21  Yes Wynetta FinesMessick, Peter C, MD  allopurinol (ZYLOPRIM) 100 MG tablet Take 1  tablet (100 mg total) by mouth daily. Patient not taking: Reported on 03/10/2021 06/16/20 06/11/21  Kallie Locks, FNP  calcium carbonate (TUMS EX) 750 MG chewable tablet Chew 2 tablets by mouth as needed for heartburn (heartburn).    [provider]  potassium chloride SA (KLOR-CON) 20 MEQ tablet TAKE 1 TABLET (20 MEQ TOTAL) BY MOUTH DAILY. Patient not taking: Reported on 03/10/2021 08/16/20 08/16/21  Muthersbaugh, Dahlia Client, PA-C    Physical Exam: Vitals:   03/10/21 1513 03/10/21 1518 03/10/21 1925 03/11/21 0035  BP:  111/80 (!)  146/100 (!) 136/97  Pulse:  97 83 80  Resp:  16 18 18   Temp:  98.8 F (37.1 C) 99.5 F (37.5 C) 98.3 F (36.8 C)  TempSrc:  Oral Oral Oral  SpO2:  100% 100% 100%  Weight: 92 kg     Height: 5\' 4"  (1.626 m)       Constitutional: NAD, calm, comfortable Eyes: PERRL, lids and conjunctivae normal ENMT: Mucous membranes are moist. Posterior pharynx clear of any exudate or lesions.Normal dentition.  Neck: normal, supple, no masses, no thyromegaly Respiratory: clear to auscultation bilaterally, no wheezing, no crackles. Normal respiratory effort. No accessory muscle use.  Cardiovascular: Regular rate and rhythm, no murmurs / rubs / gallops. No extremity edema. 2+ pedal pulses. No carotid bruits.  Abdomen: no tenderness, no masses palpated. No hepatosplenomegaly. Bowel sounds positive.  Musculoskeletal: R knee erythema and TTP. Skin: no rashes, lesions, ulcers. No induration Neurologic: CN 2-12 grossly intact. Sensation intact, DTR normal. Strength 5/5 in all 4.  Psychiatric: Normal judgment and insight. Alert and oriented x 3. Normal mood.    Labs on Admission: I have personally reviewed following labs and imaging studies  CBC: Recent Labs  Lab 03/07/21 1515 03/10/21 2105  WBC 14.1* 12.0*  NEUTROABS 11.9* 8.8*  HGB 11.0* 11.2*  HCT 34.8* 34.7*  MCV 80.7 77.8*  PLT 308 372   Basic Metabolic Panel: Recent Labs  Lab 03/07/21 1515 03/10/21 2105  NA 139 138  K 4.0 2.9*  CL 103 98  CO2 24 21*  GLUCOSE 72 70  BUN 21* 44*  CREATININE 1.70* 2.88*  CALCIUM 9.3 9.6   GFR: Estimated Creatinine Clearance: 28 mL/min (A) (by C-G formula based on SCr of 2.88 mg/dL (H)). Liver Function Tests: Recent Labs  Lab 03/07/21 1515 03/10/21 2105  AST 27 25  ALT 20 25  ALKPHOS 76 123  BILITOT 1.1 1.4*  PROT 7.5 7.5  ALBUMIN 2.8* 2.4*   No results for input(s): LIPASE, AMYLASE in the last 168 hours. No results for input(s): AMMONIA in the last 168 hours. Coagulation Profile: No  results for input(s): INR, PROTIME in the last 168 hours. Cardiac Enzymes: No results for input(s): CKTOTAL, CKMB, CKMBINDEX, TROPONINI in the last 168 hours. BNP (last 3 results) No results for input(s): PROBNP in the last 8760 hours. HbA1C: No results for input(s): HGBA1C in the last 72 hours. CBG: No results for input(s): GLUCAP in the last 168 hours. Lipid Profile: No results for input(s): CHOL, HDL, LDLCALC, TRIG, CHOLHDL, LDLDIRECT in the last 72 hours. Thyroid Function Tests: No results for input(s): TSH, T4TOTAL, FREET4, T3FREE, THYROIDAB in the last 72 hours. Anemia Panel: No results for input(s): VITAMINB12, FOLATE, FERRITIN, TIBC, IRON, RETICCTPCT in the last 72 hours. Urine analysis:    Component Value Date/Time   COLORURINE YELLOW 07/17/2017 1629   APPEARANCEUR CLOUDY (A) 07/17/2017 1629   LABSPEC 1.020 07/29/2017 1048   PHURINE 5.5 07/29/2017 1048  GLUCOSEU NEGATIVE 07/29/2017 1048   HGBUR TRACE (A) 07/29/2017 1048   BILIRUBINUR negative 02/15/2019 1113   KETONESUR negative 02/15/2019 1113   KETONESUR NEGATIVE 07/29/2017 1048   PROTEINUR negative 02/15/2019 1113   PROTEINUR NEGATIVE 07/29/2017 1048   UROBILINOGEN 0.2 02/15/2019 1113   UROBILINOGEN 0.2 07/29/2017 1048   NITRITE Negative 02/15/2019 1113   NITRITE NEGATIVE 07/29/2017 1048   LEUKOCYTESUR Small (1+) (A) 02/15/2019 1113    Radiological Exams on Admission: DG Shoulder Left  Result Date: 03/10/2021 CLINICAL DATA:  Patient complaining of pain in left shoulder and right knee. Pt has bilateral leg swelling. EXAM: LEFT SHOULDER - 2+ VIEW COMPARISON:  None. FINDINGS: There is no evidence of fracture or dislocation. There is no evidence of arthropathy or other focal bone abnormality. Soft tissues are unremarkable. IMPRESSION: Negative. Electronically Signed   By: Emmaline Kluver M.D.   On: 03/10/2021 16:35   DG Knee Complete 4 Views Right  Result Date: 03/10/2021 CLINICAL DATA:  Pt c/o pain in left  shoulder and right knee. Pt has bilateral leg swelling. EXAM: RIGHT KNEE - COMPLETE 4+ VIEW COMPARISON:  None. FINDINGS: There is a small joint effusion. No evidence of acute fracture or dislocation. Mild tricompartmental osteoarthritis. Regional soft tissues are unremarkable. IMPRESSION: Small joint effusion.  No acute osseous abnormality. Electronically Signed   By: Emmaline Kluver M.D.   On: 03/10/2021 16:37    EKG: Independently reviewed.  Assessment/Plan Principal Problem:   AKI (acute kidney injury) (HCC) Active Problems:   Essential hypertension   Chronic gout of foot   Acute gout of knee   Adverse reaction to non-steroidal anti-inflammatory drug (NSAID), initial encounter    AKI - Suspect NSAID induced kidney injury most likely due to recent heavy use of NSAIDs to treat gout. Stop ibuprofen (and all NSAIDs) Hold colchicine Hold HCTZ Gentle hydration with LR at 100 cc/hr, got 1L NS bolus in ED Replace K Strict intake and output Repeat BMP in AM Nephro consult if not having rapid renal recovery Acute gout of knee - Knee tap demonstrating intracellular monosodium urate crystals, consistent with acute gout flare. Will put pt back on prednisone 20mg  PO daily as the least likely nephrotoxic treatment option. Once acute gout under control, pt will need to be started on allopurinol again (hasnt been taking for a while now, dont want to start in setting of acute gout flare as this may worsen flare). Norco PRN if absolutely needed for pain control (since cant use NSAIDS or colchicine). HTN - Cont amlodipine Hold HCTZ  DVT prophylaxis: Heparin [place] Code Status: Full Family Communication: No family in room Disposition Plan: Home after renal recovery Consults called: None Admission status: Place in 75     Hayde Kilgour M. DO [facility]  How to contact the [facility] Attending or Consulting provider 7A - 7P or covering provider during after hours 7P -7A, for this patient?   Check the care team in [facility] and look for a) attending/consulting [facility] provider listed and b) the [facility] team listed Log into [email or web address]  Amion Physician Scheduling and messaging for groups and whole hospitals  On call and physician scheduling software for group practices, residents, hospitalists and other medical providers for call, clinic, rotation and shift schedules. On[employer] is a hospital-wide system for scheduling doctors and paging doctors on call. EasyPlot is for scientific plotting and data analysis.  [email or web address]  and use [place]'s universal password to access. If you do not have the password, please contact the  hospital operator.  Locate the [facility] provider you are looking for under [facility] and page to a number that you can be directly reached. If you still have difficulty reaching the provider, please page the [facility] (Director on Call) for the Hospitalists listed on amion for assistance.  03/11/2021, 1:30 AM

## 2021-03-11 NOTE — ED Notes (Signed)
RN attempted to call report to [phone] x1.  

## 2021-03-12 LAB — CBC WITH DIFFERENTIAL/PLATELET
Abs Immature Granulocytes: 0.07 10*3/uL (ref 0.00–0.07)
Basophils Absolute: 0 10*3/uL (ref 0.0–0.1)
Basophils Relative: 0 %
Eosinophils Absolute: 0 10*3/uL (ref 0.0–0.5)
Eosinophils Relative: 0 %
HCT: 31.8 % — ABNORMAL LOW (ref 36.0–46.0)
Hemoglobin: 10.2 g/dL — ABNORMAL LOW (ref 12.0–15.0)
Immature Granulocytes: 1 %
Lymphocytes Relative: 5 %
Lymphs Abs: 0.5 10*3/uL — ABNORMAL LOW (ref 0.7–4.0)
MCH: 24.8 pg — ABNORMAL LOW (ref 26.0–34.0)
MCHC: 32.1 g/dL (ref 30.0–36.0)
MCV: 77.2 fL — ABNORMAL LOW (ref 80.0–100.0)
Monocytes Absolute: 0.6 10*3/uL (ref 0.1–1.0)
Monocytes Relative: 5 %
Neutro Abs: 9.4 10*3/uL — ABNORMAL HIGH (ref 1.7–7.7)
Neutrophils Relative %: 89 %
Platelets: 349 10*3/uL (ref 150–400)
RBC: 4.12 MIL/uL (ref 3.87–5.11)
RDW: 14.4 % (ref 11.5–15.5)
WBC: 10.6 10*3/uL — ABNORMAL HIGH (ref 4.0–10.5)
nRBC: 0 % (ref 0.0–0.2)

## 2021-03-12 LAB — BASIC METABOLIC PANEL
Anion gap: 12 (ref 5–15)
BUN: 40 mg/dL — ABNORMAL HIGH (ref 6–20)
CO2: 21 mmol/L — ABNORMAL LOW (ref 22–32)
Calcium: 9.6 mg/dL (ref 8.9–10.3)
Chloride: 107 mmol/L (ref 98–111)
Creatinine, Ser: 1.8 mg/dL — ABNORMAL HIGH (ref 0.44–1.00)
GFR, Estimated: 36 mL/min — ABNORMAL LOW (ref >60)
Glucose, Bld: 106 mg/dL — ABNORMAL HIGH (ref 70–99)
Potassium: 3.7 mmol/L (ref 3.5–5.1)
Sodium: 140 mmol/L (ref 135–145)

## 2021-03-12 LAB — SEDIMENTATION RATE: Sed Rate: 74 mm/hr — ABNORMAL HIGH (ref 0–22)

## 2021-03-12 LAB — C-REACTIVE PROTEIN: CRP: 14 mg/dL — ABNORMAL HIGH (ref <1.0)

## 2021-03-12 LAB — PROCALCITONIN: Procalcitonin: 0.36 ng/mL

## 2021-03-12 MED ORDER — PREDNISONE 10 MG PO TABS: ORAL_TABLET | ORAL | 0 refills | Status: DC

## 2021-03-12 MED ORDER — CEFDINIR 300 MG PO CAPS: 300.0000 mg | ORAL_CAPSULE | Freq: Two times a day (BID) | ORAL | 0 refills | Status: AC

## 2021-03-12 MED ORDER — HYDROCHLOROTHIAZIDE 25 MG PO TABS: 25.0000 mg | ORAL_TABLET | Freq: Every day | ORAL | 0 refills | Status: DC

## 2021-03-12 MED ORDER — ALLOPURINOL 100 MG PO TABS: 100.0000 mg | ORAL_TABLET | Freq: Every day | ORAL | 0 refills | Status: DC

## 2021-03-12 NOTE — Discharge Summary (Signed)
Physician Discharge Summary  Suzanne Graham GUR:[id number] DOB: 07/29/1980 DOA: 03/10/2021  PCP: Kallie Locks, FNP (Inactive)  Admit date: 03/10/2021 Discharge date: 03/12/2021    Admitted From: Home Disposition: Home  Recommendations for Outpatient Follow-up:  Follow up with PCP in 1-2 weeks Please obtain BMP/CBC in one week Please follow up with your PCP on the following pending results: Unresulted Labs (From admission, onward)     Start     Ordered   03/12/21 0747  C Difficile Quick Screen w PCR reflex  (C Difficile quick screen w PCR reflex panel )  Once, for 24 hours,   TIMED       References:    CDiff Information Tool   03/12/21 0746   03/11/21 0059  Urinalysis, Complete w Microscopic  Once,   STAT        03/11/21 0058   03/10/21 1908  Glucose, Body Fluid Other  (Arthrocentesis Panel)  ONCE - STAT,   STAT        03/10/21 1908   03/10/21 1908  Protein, body fluid (other)  (Arthrocentesis Panel)  ONCE - STAT,   STAT        03/10/21 1908   03/10/21 1908  Uric Acid, Body Fluid  (Arthrocentesis Panel)  ONCE - STAT,   STAT        03/10/21 1908              Home Health: None Equipment/Devices: None  Discharge Condition: Stable CODE STATUS: Full code Diet recommendation: Cardiac  Subjective: Seen and examined.  Feels well.  Knee pain is improved.  No cough or shortness of breath or any other complaint.  She desires to go home.  Brief/Interim Summary: Suzanne Graham is a 40 y.o. female with medical history significant of Gout, GERD, HTN, yet once again presented to ED with concerns of ongoing and worsening right knee pain/gout flare. Pt initially treated in ED on 8/29 for gout.  Started on Prednisone, oxy, ibuprofen.Seen back in ED on 8/31.  Noted to have mild AKI at that time.  She was once again discharged and continued ibuprofen.   She came back in ED on 9/3 with ongoing knee pain, severe, not helped by ibuprofen.  Was helped for a short while by prednisone but script of  prednisone ran out.  No fever or chills or any other complaint.  Upon arrival to ED, she was hemodynamically stable however her creatinine was 2.88, up from 1.7 on 03/07/2021 but her baseline is around 1.3-1.4.  Patient underwent right knee arthrocentesis in the ED which showed monosodium urate confirming the diagnosis of gouty arthritis of the right knee.  Patient was started on prednisone due to having AKI on CKD stage IIIa.  Started on IV hydration.  NSAIDs were held.  She was also incidentally tested positive for COVID-19 however she did not have any respiratory symptoms but she did have some elevated inflammatory markers which could very well be either due to COVID itself or due to gout.  Synovial fluid also showed 45,000 of white cells and I did discuss the case with on-call ID Dr. Andi Hence who opined that the white blood cell is likely secondary to gout and not septic arthritis especially since patient has improved with arthrocentesis and prednisone and no other signs of infection.  Patient's renal function has improved and currently it is around 1.8.  Due to COVID, chest x-ray was done which showed questionable bibasilar opacities/infiltrate and her procalcitonin is 0.36.  Although she  has no symptoms but she may have some brewing pneumonia.  Patient was advised to stay overnight, continue IV fluids and monitor however patient desires to go home since she has no symptoms.  She is being discharged per her request.  She is being discharged on tapering dose of prednisone, cefdinir 500 mg p.o. twice daily.  Since her GFR is still 36, prescribing Paxlovid would might not be safe for her until her renal function further improves.  She has been advised to hold her hydrochlorothiazide as well as allopurinol until 2 more days and hydrate herself very well.  She will be seen by her PCP within a week.  I recommend CBC and BMP to be repeated at PCPs visit next week.   Discharge Diagnoses:  Principal Problem:   AKI  (acute kidney injury) (HCC) Active Problems:   Essential hypertension   Chronic gout of foot   Acute gout of knee   Adverse reaction to non-steroidal anti-inflammatory drug (NSAID), initial encounter    Discharge Instructions   Allergies as of 03/12/2021       Reactions   Nsaids Other (See Comments)   Likely causing AKI        Medication List     STOP taking these medications    colchicine 0.6 MG tablet   potassium chloride SA 20 MEQ tablet Commonly known as: KLOR-CON       TAKE these medications    acetaminophen 500 MG tablet Commonly known as: TYLENOL Take 500 mg by mouth every 6 (six) hours as needed for moderate pain or headache.   albuterol 108 (90 Base) MCG/ACT inhaler Commonly known as: VENTOLIN HFA INHALE 2 PUFFS INTO THE LUNGS EVERY 4 (FOUR) HOURS AS NEEDED FOR WHEEZING OR SHORTNESS OF BREATH.   allopurinol 100 MG tablet Commonly known as: ZYLOPRIM Take 1 tablet (100 mg total) by mouth daily. Start taking on: March 14, 2021 What changed: These instructions start on March 14, 2021. If you are unsure what to do until then, ask your doctor or other care provider.   amLODipine 10 MG tablet Commonly known as: NORVASC TAKE 1 TABLET BY MOUTH DAILY What changed: how much to take   calcium carbonate 750 MG chewable tablet Commonly known as: TUMS EX Chew 2 tablets by mouth as needed for heartburn (heartburn).   cefdinir 300 MG capsule Commonly known as: OMNICEF Take 1 capsule (300 mg total) by mouth 2 (two) times daily for 5 days.   hydrochlorothiazide 25 MG tablet Commonly known as: HYDRODIURIL Take 1 tablet (25 mg total) by mouth daily. Start taking on: March 14, 2021 What changed:  how much to take These instructions start on March 14, 2021. If you are unsure what to do until then, ask your doctor or other care provider.   oxyCODONE-acetaminophen 5-325 MG tablet Commonly known as: PERCOCET/ROXICET Take 1 tablet by mouth every 8  (eight) hours as needed for severe pain.   predniSONE 10 MG tablet Commonly known as: DELTASONE Take 4 tabs (40 mg) po daily for 4 days, followed by 3 tabs (30 mg) po daily for 4 days, followed by 2 tabs (20 mg) po daily for 4 days, followed by 10 mg po daily for 4 days and then stop        Follow-up Information     Kallie Locks, FNP Follow up in 1 week(s).   Specialty: Family Medicine Contact information: [address] [place] [address] [phone]  Allergies  Allergen Reactions   Nsaids Other (See Comments)    Likely causing AKI    Consultations: None   Procedures/Studies: DG CHEST PORT 1 VIEW  Result Date: 03/11/2021 CLINICAL DATA:  COVID positive EXAM: PORTABLE CHEST 1 VIEW COMPARISON:  Radiograph 06/10/2018, chest CT 06/10/2018 FINDINGS: Patient is slightly rotated towards the right. Borderline enlarged cardiac silhouette. There is a cardiac loop recorder overlying the lower mid chest. There are faint airspace opacities in the lower lobes bilaterally. There is no large pleural effusion or visible pneumothorax. No acute osseous abnormality. IMPRESSION: Faint airspace opacities in the lower lobes bilaterally, which could be a developing infectious/inflammatory process. Electronically Signed   By: Caprice Renshaw M.D.   On: 03/11/2021 09:03   DG Shoulder Left  Result Date: 03/10/2021 CLINICAL DATA:  Patient complaining of pain in left shoulder and right knee. Pt has bilateral leg swelling. EXAM: LEFT SHOULDER - 2+ VIEW COMPARISON:  None. FINDINGS: There is no evidence of fracture or dislocation. There is no evidence of arthropathy or other focal bone abnormality. Soft tissues are unremarkable. IMPRESSION: Negative. Electronically Signed   By: Emmaline Kluver M.D.   On: 03/10/2021 16:35   DG Knee Complete 4 Views Right  Result Date: 03/10/2021 CLINICAL DATA:  Pt c/o pain in left shoulder and right knee. Pt has bilateral leg swelling. EXAM: RIGHT  KNEE - COMPLETE 4+ VIEW COMPARISON:  None. FINDINGS: There is a small joint effusion. No evidence of acute fracture or dislocation. Mild tricompartmental osteoarthritis. Regional soft tissues are unremarkable. IMPRESSION: Small joint effusion.  No acute osseous abnormality. Electronically Signed   By: Emmaline Kluver M.D.   On: 03/10/2021 16:37   VAS [place] LOWER EXTREMITY VENOUS (DVT) ([facility] and [facility] 7a-7p)  Result Date: 03/07/2021  Lower Venous DVT Study Patient Name:  WAHNETA DEROCHER  Date of Exam:   03/07/2021 Medical Rec #: [id number]       Accession #:    [id number] Date of Birth: [birth year]        Patient Gender: F Patient Age:   38 years Exam Location:  [facility] Procedure:      VAS [place] LOWER EXTREMITY VENOUS (DVT) Referring Phys: Fayrene Helper --------------------------------------------------------------------------------  Indications: Swelling, and Pain.  Limitations: Patient positioning, clothing. Comparison Study: 07/18/17 prior Performing Technologist: [place] Ponder RVS  Examination Guidelines: A complete evaluation includes B-mode imaging, spectral Doppler, color Doppler, and power Doppler as needed of all accessible portions of each vessel. Bilateral testing is considered an integral part of a complete examination. Limited examinations for reoccurring indications may be performed as noted. The reflux portion of the exam is performed with the patient in reverse Trendelenburg.  +-----+---------------+---------+-----------+----------+-------------------+ RIGHTCompressibilityPhasicitySpontaneityPropertiesThrombus Aging      +-----+---------------+---------+-----------+----------+-------------------+ CFV                                               Not well visualized +-----+---------------+---------+-----------+----------+-------------------+   +---------+---------------+---------+-----------+----------+--------------+ LEFT     CompressibilityPhasicitySpontaneityPropertiesThrombus  Aging +---------+---------------+---------+-----------+----------+--------------+ CFV      Full           Yes      Yes                                 +---------+---------------+---------+-----------+----------+--------------+ SFJ      Full                                                        +---------+---------------+---------+-----------+----------+--------------+  FV Prox  Full                                                        +---------+---------------+---------+-----------+----------+--------------+ FV Mid   Full                                                        +---------+---------------+---------+-----------+----------+--------------+ FV DistalFull                                                        +---------+---------------+---------+-----------+----------+--------------+ PFV      Full                                                        +---------+---------------+---------+-----------+----------+--------------+ POP      Full           Yes      Yes                                 +---------+---------------+---------+-----------+----------+--------------+ PTV      Full                                                        +---------+---------------+---------+-----------+----------+--------------+ PERO     Full                                                        +---------+---------------+---------+-----------+----------+--------------+    Summary: LEFT: - There is no evidence of deep vein thrombosis in the lower extremity.  - No cystic structure found in the popliteal fossa.  *See table(s) above for measurements and observations. Electronically signed by Coral Else MD on 03/07/2021 at 6:27:34 PM.    Final      Discharge Exam: Vitals:   03/11/21 1956 03/12/21 0810  BP: (!) 160/102 (!) 161/110  Pulse: 67 64  Resp: 16 20  Temp: (!) 97.4 F (36.3 C) (!) 97.4 F (36.3 C)  SpO2: 99% 100%   Vitals:   03/11/21 0743  03/11/21 1423 03/11/21 1956 03/12/21 0810  BP: (!) 137/95 (!) 161/106 (!) 160/102 (!) 161/110  Pulse: 79 77 67 64  Resp: Temp:   (!) 97.4 F (36.3 C) (!) 97.4 F (36.3 C)  TempSrc:    Oral  SpO2: 99% 100% 99% 100%  Weight:      Height:        General: Pt is alert,  awake, not in acute distress Cardiovascular: RRR, S1/S2 +, no rubs, no gallops Respiratory: Right basilar rhonchi, no wheezing, Abdominal: Soft, NT, ND, bowel sounds + Extremities: no edema, no cyanosis    The results of significant diagnostics from this hospitalization (including imaging, microbiology, ancillary and laboratory) are listed below for reference.     Microbiology: Recent Results (from the past 240 hour(s))  Body fluid culture w Gram Stain     Status: None (Preliminary result)   Collection Time: 03/10/21  8:32 PM   Specimen: Body Fluid  Result Value Ref Range Status   Specimen Description FLUID SYNOVIAL RIGHT KNEE  Final   Special Requests SYRINGE  Final   Gram Stain   Final    MODERATE WBC PRESENT, PREDOMINANTLY PMN NO ORGANISMS SEEN    Culture   Final    NO GROWTH 2 DAYS Performed at [facility], [address], [place], [place] [address]    Report Status PENDING  Incomplete  Resp Panel by RT-PCR (Flu A&B, Covid) Nasopharyngeal Swab     Status: Abnormal   Collection Time: 03/11/21 12:25 AM   Specimen: Nasopharyngeal Swab; Nasopharyngeal(NP) swabs in vial transport medium  Result Value Ref Range Status   SARS Coronavirus 2 by RT PCR POSITIVE (A) NEGATIVE Final    Comment: RESULT CALLED TO, READ BACK BY AND VERIFIED WITH: S VANDYKE,RN@0248  03/11/21 MK (NOTE) SARS-CoV-2 target nucleic acids are DETECTED.  The SARS-CoV-2 RNA is generally detectable in upper respiratory specimens during the acute phase of infection. Positive results are indicative of the presence of the identified virus, but do not rule out bacterial infection or co-infection with other pathogens  not detected by the test. Clinical correlation with patient history and other diagnostic information is necessary to determine patient infection status. The expected result is Negative.  Fact Sheet for Patients: BloggerCourse.com  Fact Sheet for Healthcare Providers: SeriousBroker.it  This test is not yet approved or cleared by the [place] FDA and  has been authorized for detection and/or diagnosis of SARS-CoV-2 by FDA under an Emergency Use Authorization (EUA).  This EUA will remain in effect (meaning this test can be use d) for the duration of  the COVID-19 declaration under Section 564(b)(1) of the Act, 21 U.S.C. section 360bbb-3(b)(1), unless the authorization is terminated or revoked sooner.     Influenza A by PCR NEGATIVE NEGATIVE Final   Influenza B by PCR NEGATIVE NEGATIVE Final    Comment: (NOTE) The Xpert Xpress SARS-CoV-2/FLU/RSV plus assay is intended as an aid in the diagnosis of influenza from Nasopharyngeal swab specimens and should not be used as a sole basis for treatment. Nasal washings and aspirates are unacceptable for Xpert Xpress SARS-CoV-2/FLU/RSV testing.  Fact Sheet for Patients: BloggerCourse.com  Fact Sheet for Healthcare Providers: SeriousBroker.it  This test is not yet approved or cleared by the [place] FDA and has been authorized for detection and/or diagnosis of SARS-CoV-2 by FDA under an Emergency Use Authorization (EUA). This EUA will remain in effect (meaning this test can be used) for the duration of the COVID-19 declaration under Section 564(b)(1) of the Act, 21 U.S.C. section 360bbb-3(b)(1), unless the authorization is terminated or revoked.  Performed at [facility], [address] [place], [place] [address]      Labs: BNP (last 3 results) No results for input(s): BNP in the last 8760 hours. Basic Metabolic  Panel: Recent Labs  Lab 03/07/21 1515 03/10/21 2105 03/11/21 0305 03/12/21 0139  NA 139 138  137 140  K 4.0 2.9* 3.3* 3.7  CL 103 98 102 107  CO2 24 21* 19* 21*  GLUCOSE 72 70 80 106*  BUN 21* 44* 45* 40*  CREATININE 1.70* 2.88* 2.61* 1.80*  CALCIUM 9.3 9.6 8.9 9.6   Liver Function Tests: Recent Labs  Lab 03/07/21 1515 03/10/21 2105  AST 27 25  ALT 20 25  ALKPHOS 76 123  BILITOT 1.1 1.4*  PROT 7.5 7.5  ALBUMIN 2.8* 2.4*   No results for input(s): LIPASE, AMYLASE in the last 168 hours. No results for input(s): AMMONIA in the last 168 hours. CBC: Recent Labs  Lab 03/07/21 1515 03/10/21 2105 03/11/21 0305 03/12/21 0139  WBC 14.1* 12.0* 9.8 10.6*  NEUTROABS 11.9* 8.8*  --  9.4*  HGB 11.0* 11.2* 10.1* 10.2*  HCT 34.8* 34.7* 30.8* 31.8*  MCV 80.7 77.8* 76.2* 77.2*  PLT 308 372 311 349   Cardiac Enzymes: No results for input(s): CKTOTAL, CKMB, CKMBINDEX, TROPONINI in the last 168 hours. BNP: Invalid input(s): POCBNP CBG: No results for input(s): GLUCAP in the last 168 hours. D-Dimer No results for input(s): DDIMER in the last 72 hours. Hgb A1c No results for input(s): HGBA1C in the last 72 hours. Lipid Profile No results for input(s): CHOL, HDL, LDLCALC, TRIG, CHOLHDL, LDLDIRECT in the last 72 hours. Thyroid function studies No results for input(s): TSH, T4TOTAL, T3FREE, THYROIDAB in the last 72 hours.  Invalid input(s): FREET3 Anemia work up No results for input(s): VITAMINB12, FOLATE, FERRITIN, TIBC, IRON, RETICCTPCT in the last 72 hours. Urinalysis    Component Value Date/Time   COLORURINE YELLOW 07/17/2017 1629   APPEARANCEUR CLOUDY (A) 07/17/2017 1629   LABSPEC 1.020 07/29/2017 1048   PHURINE 5.5 07/29/2017 1048   GLUCOSEU NEGATIVE 07/29/2017 1048   HGBUR TRACE (A) 07/29/2017 1048   BILIRUBINUR negative 02/15/2019 1113   KETONESUR negative 02/15/2019 1113   KETONESUR NEGATIVE 07/29/2017 1048   PROTEINUR negative 02/15/2019 1113   PROTEINUR  NEGATIVE 07/29/2017 1048   UROBILINOGEN 0.2 02/15/2019 1113   UROBILINOGEN 0.2 07/29/2017 1048   NITRITE Negative 02/15/2019 1113   NITRITE NEGATIVE 07/29/2017 1048   LEUKOCYTESUR Small (1+) (A) 02/15/2019 1113   Sepsis Labs Invalid input(s): PROCALCITONIN,  WBC,  LACTICIDVEN Microbiology Recent Results (from the past 240 hour(s))  Body fluid culture w Gram Stain     Status: None (Preliminary result)   Collection Time: 03/10/21  8:32 PM   Specimen: Body Fluid  Result Value Ref Range Status   Specimen Description FLUID SYNOVIAL RIGHT KNEE  Final   Special Requests SYRINGE  Final   Gram Stain   Final    MODERATE WBC PRESENT, PREDOMINANTLY PMN NO ORGANISMS SEEN    Culture   Final    NO GROWTH 2 DAYS Performed at [facility], [address] [place], [place] [address]    Report Status PENDING  Incomplete  Resp Panel by RT-PCR (Flu A&B, Covid) Nasopharyngeal Swab     Status: Abnormal   Collection Time: 03/11/21 12:25 AM   Specimen: Nasopharyngeal Swab; Nasopharyngeal(NP) swabs in vial transport medium  Result Value Ref Range Status   SARS Coronavirus 2 by RT PCR POSITIVE (A) NEGATIVE Final    Comment: RESULT CALLED TO, READ BACK BY AND VERIFIED WITH: S VANDYKE,RN@0248  03/11/21 MK (NOTE) SARS-CoV-2 target nucleic acids are DETECTED.  The SARS-CoV-2 RNA is generally detectable in upper respiratory specimens during the acute phase of infection. Positive results are indicative of the presence of the identified virus,  but do not rule out bacterial infection or co-infection with other pathogens not detected by the test. Clinical correlation with patient history and other diagnostic information is necessary to determine patient infection status. The expected result is Negative.  Fact Sheet for Patients: BloggerCourse.com  Fact Sheet for Healthcare Providers: SeriousBroker.it  This test is not yet approved or cleared by the  [place] FDA and  has been authorized for detection and/or diagnosis of SARS-CoV-2 by FDA under an Emergency Use Authorization (EUA).  This EUA will remain in effect (meaning this test can be use d) for the duration of  the COVID-19 declaration under Section 564(b)(1) of the Act, 21 U.S.C. section 360bbb-3(b)(1), unless the authorization is terminated or revoked sooner.     Influenza A by PCR NEGATIVE NEGATIVE Final   Influenza B by PCR NEGATIVE NEGATIVE Final    Comment: (NOTE) The Xpert Xpress SARS-CoV-2/FLU/RSV plus assay is intended as an aid in the diagnosis of influenza from Nasopharyngeal swab specimens and should not be used as a sole basis for treatment. Nasal washings and aspirates are unacceptable for Xpert Xpress SARS-CoV-2/FLU/RSV testing.  Fact Sheet for Patients: BloggerCourse.com  Fact Sheet for Healthcare Providers: SeriousBroker.it  This test is not yet approved or cleared by the [place] FDA and has been authorized for detection and/or diagnosis of SARS-CoV-2 by FDA under an Emergency Use Authorization (EUA). This EUA will remain in effect (meaning this test can be used) for the duration of the COVID-19 declaration under Section 564(b)(1) of the Act, 21 U.S.C. section 360bbb-3(b)(1), unless the authorization is terminated or revoked.  Performed at [facility], [address], [place], [place] [address]      Time coordinating discharge: Over 30 minutes  SIGNED:   Hughie Closs, MD  [facility] 03/12/2021, 11:00 AM  If 7PM-7AM, please contact night-coverage [email or web address]

## 2021-03-12 NOTE — Progress Notes (Signed)
PT Cancellation Note  Patient Details Name: Amity Roes MRN: [id number] DOB: 31-Aug-1980   Cancelled Treatment:    Reason Eval/Treat Not Completed: Other (comment) Pt with imminent d/c orders enter 15:56, PT arrived 16:30 and RN getting pt in chair to d/c as her ride is here. Anise Salvo, PT Acute Rehab Services Pager [phone] [facility] [phone]   Rayetta Humphrey 03/12/2021, 4:30 PM

## 2021-03-13 ENCOUNTER — Other Ambulatory Visit: Payer: Self-pay

## 2021-03-13 ENCOUNTER — Telehealth: Payer: Self-pay

## 2021-03-13 LAB — PROTEIN, BODY FLUID (OTHER): Total Protein, Body Fluid Other: 1.4 g/dL

## 2021-03-13 LAB — URIC ACID, BODY FLUID: Uric Acid Body Fluid: 42 mg/dL

## 2021-03-13 MED FILL — Amlodipine Besylate Tab 10 MG (Base Equivalent): ORAL | 30 days supply | Qty: 30 | Fill #2 | Status: AC

## 2021-03-13 NOTE — Telephone Encounter (Signed)
ILR has reached EOS 01/03/21. Attempted to call contact patient to advise. No answer, LMTCB.  Remotes canceled.  Marked "I" in [employer].   Please take out of [employer] and send return kit.

## 2021-03-13 NOTE — Telephone Encounter (Signed)
Patient's mother, Alda Ponder, came to [facility] today to pick up patient's medications and speak to this CM.  She reported that her daughter was just discharged from the hospital and needs a follow up appointment.  Informed her that her daughter can call her PCP to schedule the appointment and the phone number is on her AVS.   She then said she needs help for her daughter who is uninsured at this time. Explained to her that home health care and/or PCS cannot be arranged from the community without insurance. For PCS, she would need to have Medicaid. Explained to her mother that she can re-apply for Medicaid on line or submit a paper application and contact DSS for assistance with the application or  if she has further questions about the process.

## 2021-03-14 ENCOUNTER — Other Ambulatory Visit: Payer: Self-pay

## 2021-03-14 LAB — BODY FLUID CULTURE W GRAM STAIN: Culture: NO GROWTH

## 2021-03-15 NOTE — Telephone Encounter (Signed)
Attempted both numbers on file. No answer, LMTCB.

## 2021-03-16 LAB — GLUCOSE, BODY FLUID OTHER

## 2021-03-16 NOTE — Telephone Encounter (Signed)
Attempted to contact patient again (x3).   Certified letter sent.

## 2021-03-16 NOTE — Progress Notes (Signed)
Certified letter sent 

## 2021-03-24 ENCOUNTER — Encounter (HOSPITAL_COMMUNITY): Payer: Self-pay | Admitting: Emergency Medicine

## 2021-03-24 ENCOUNTER — Emergency Department (HOSPITAL_COMMUNITY)
Admission: EM | Admit: 2021-03-24 | Discharge: 2021-03-24 | Disposition: A | Discharge status: Home / Self Care | Payer: Medicaid Other | Attending: Emergency Medicine | Admitting: Emergency Medicine

## 2021-03-24 ENCOUNTER — Other Ambulatory Visit: Payer: Self-pay

## 2021-03-24 DIAGNOSIS — G8929 Other chronic pain: Secondary | ICD-10-CM | POA: Diagnosis not present

## 2021-03-24 DIAGNOSIS — M25561 Pain in right knee: Secondary | ICD-10-CM | POA: Insufficient documentation

## 2021-03-24 DIAGNOSIS — M25511 Pain in right shoulder: Secondary | ICD-10-CM | POA: Insufficient documentation

## 2021-03-24 DIAGNOSIS — R2232 Localized swelling, mass and lump, left upper limb: Secondary | ICD-10-CM | POA: Insufficient documentation

## 2021-03-24 DIAGNOSIS — M79671 Pain in right foot: Secondary | ICD-10-CM | POA: Diagnosis not present

## 2021-03-24 DIAGNOSIS — M25512 Pain in left shoulder: Secondary | ICD-10-CM | POA: Insufficient documentation

## 2021-03-24 DIAGNOSIS — F17210 Nicotine dependence, cigarettes, uncomplicated: Secondary | ICD-10-CM | POA: Insufficient documentation

## 2021-03-24 DIAGNOSIS — Z79899 Other long term (current) drug therapy: Secondary | ICD-10-CM | POA: Diagnosis not present

## 2021-03-24 DIAGNOSIS — J45909 Unspecified asthma, uncomplicated: Secondary | ICD-10-CM | POA: Diagnosis not present

## 2021-03-24 DIAGNOSIS — I10 Essential (primary) hypertension: Secondary | ICD-10-CM | POA: Insufficient documentation

## 2021-03-24 DIAGNOSIS — M1A00X0 Idiopathic chronic gout, unspecified site, without tophus (tophi): Secondary | ICD-10-CM

## 2021-03-24 LAB — CBC
HCT: 31.4 % — ABNORMAL LOW (ref 36.0–46.0)
Hemoglobin: 10.3 g/dL — ABNORMAL LOW (ref 12.0–15.0)
MCH: 25 pg — ABNORMAL LOW (ref 26.0–34.0)
MCHC: 32.8 g/dL (ref 30.0–36.0)
MCV: 76.2 fL — ABNORMAL LOW (ref 80.0–100.0)
Platelets: 520 10*3/uL — ABNORMAL HIGH (ref 150–400)
RBC: 4.12 MIL/uL (ref 3.87–5.11)
RDW: 15 % (ref 11.5–15.5)
WBC: 20.3 10*3/uL — ABNORMAL HIGH (ref 4.0–10.5)
nRBC: 0 % (ref 0.0–0.2)

## 2021-03-24 LAB — BASIC METABOLIC PANEL
Anion gap: 12 (ref 5–15)
BUN: 28 mg/dL — ABNORMAL HIGH (ref 6–20)
CO2: 26 mmol/L (ref 22–32)
Calcium: 9.5 mg/dL (ref 8.9–10.3)
Chloride: 97 mmol/L — ABNORMAL LOW (ref 98–111)
Creatinine, Ser: 1.5 mg/dL — ABNORMAL HIGH (ref 0.44–1.00)
GFR, Estimated: 44 mL/min — ABNORMAL LOW (ref >60)
Glucose, Bld: 93 mg/dL (ref 70–99)
Potassium: 4 mmol/L (ref 3.5–5.1)
Sodium: 135 mmol/L (ref 135–145)

## 2021-03-24 MED ORDER — OXYCODONE-ACETAMINOPHEN 5-325 MG PO TABS
2.0000 | ORAL_TABLET | Freq: Once | ORAL | Status: AC
  Administered 2021-03-24: 2 via ORAL
  Filled 2021-03-24: qty 2

## 2021-03-24 MED ORDER — PREDNISONE 20 MG PO TABS
60.0000 mg | ORAL_TABLET | Freq: Once | ORAL | Status: AC
  Administered 2021-03-24: 60 mg via ORAL
  Filled 2021-03-24: qty 3

## 2021-03-24 NOTE — ED Provider Notes (Signed)
[facility] EMERGENCY DEPARTMENT Provider Note   CSN: [id number] Arrival date & time: 03/24/21  1123     History No chief complaint on file.   Suzanne Graham is a 40 y.o. female.  HPI 40 year old female with medical history significant for gout, GERD, hypertension, recent discharge after admission for AKI and worsening gout pain of right knee presents today with ongoing right knee pain and now with left elbow swelling.  She is also complaining of some bilateral shoulder pain she is worsened with her use of her crutches which she has been using due to the knee and foot pain on the right.  During admission she had t arthrocentesis performed that showed an elevated white blood cell count.  It was thought to be reactive due to the gout.  I have reviewed reviewed the culture that did not grow any organisms.  She was improved with creatinine from 2.88 on admission decreased to 1.8 on discharge. She is still currently being treated with prednisone and allopurinol for her gout.  She has not had any overlying redness or fevers.    Past Medical History:  Diagnosis Date   Acid reflux 08/2019   Asthma    Depression    Gout    Hypertension    Stroke [facility])     Patient Active Problem List   Diagnosis Date Noted   Acute gout of knee 03/11/2021   AKI (acute kidney injury) (HCC) 03/11/2021   Adverse reaction to non-steroidal anti-inflammatory drug (NSAID), initial encounter 03/11/2021   Vitamin D deficiency 11/17/2019   Chronic gout of foot 10/11/2018   Current smoker 10/11/2018   Generalized pain 10/11/2018   Chronic nonintractable headache 08/06/2017   Essential hypertension 08/06/2017   Stroke (HCC) 07/17/2017    Past Surgical History:  Procedure Laterality Date   LOOP RECORDER INSERTION N/A 08/19/2017   Procedure: LOOP RECORDER INSERTION;  Surgeon: Hillis Range, MD;  Location: [facility] INVASIVE CV LAB;  Service: Cardiovascular;  Laterality: N/A;   TEE WITHOUT  CARDIOVERSION N/A 08/19/2017   Procedure: TRANSESOPHAGEAL ECHOCARDIOGRAM (TEE);  Surgeon: Pricilla Riffle, MD;  Location: [facility] ENDOSCOPY;  Service: Cardiovascular;  Laterality: N/A;     OB History     Gravida  0   Para  0   Term  0   Preterm  0   AB  0   Living  0      SAB  0   IAB  0   Ectopic  0   Multiple  0   Live Births  0           Family History  Problem Relation Age of Onset   Heart failure Mother    Hypertension Mother    Stroke Neg Hx     Social History   Tobacco Use   Smoking status: Every Day    Packs/day: 0.25    Types: Cigarettes   Smokeless tobacco: Never  Vaping Use   Vaping Use: Never used  Substance Use Topics   Alcohol use: Yes    Comment: rare   Drug use: No    Home Medications Prior to Admission medications   Medication Sig Start Date End Date Taking? Authorizing Provider  acetaminophen (TYLENOL) 500 MG tablet Take 500 mg by mouth every 6 (six) hours as needed for moderate pain or headache.    [provider]  albuterol (VENTOLIN HFA) 108 (90 Base) MCG/ACT inhaler INHALE 2 PUFFS INTO THE LUNGS EVERY 4 (FOUR) HOURS AS NEEDED  FOR WHEEZING OR SHORTNESS OF BREATH. 06/15/20   Kallie Locks, FNP  allopurinol (ZYLOPRIM) 100 MG tablet Take 1 tablet (100 mg total) by mouth daily. 03/14/21 04/13/21  Hughie Closs, MD  amLODipine (NORVASC) 10 MG tablet TAKE 1 TABLET BY MOUTH DAILY Patient taking differently: Take 10 mg by mouth daily. 05/05/20 05/05/21  Kallie Locks, FNP  calcium carbonate (TUMS EX) 750 MG chewable tablet Chew 2 tablets by mouth as needed for heartburn (heartburn).    [provider]  hydrochlorothiazide (HYDRODIURIL) 25 MG tablet Take 1 tablet (25 mg total) by mouth daily. 03/14/21 04/13/21  Hughie Closs, MD  oxyCODONE-acetaminophen (PERCOCET/ROXICET) 5-325 MG tablet Take 1 tablet by mouth every 8 (eight) hours as needed for severe pain. 03/05/21   Wynetta Fines, MD  predniSONE (DELTASONE) 10 MG tablet  Take 4 tabs (40 mg) po daily for 4 days, followed by 3 tabs (30 mg) po daily for 4 days, followed by 2 tabs (20 mg) po daily for 4 days, followed by 10 mg po daily for 4 days and then stop 03/12/21   Hughie Closs, MD    Allergies    Nsaids  Review of Systems   Review of Systems  All other systems reviewed and are negative.  Physical Exam Updated Vital Signs BP 136/87 (BP Location: Left Arm)   Pulse 96   Temp 99 F (37.2 C) (Oral)   Resp 16   LMP 02/21/2021   SpO2 100%   Physical Exam Vitals and nursing note reviewed.  Constitutional:      Appearance: Normal appearance.  HENT:     Head: Normocephalic.     Right Ear: External ear normal.     Left Ear: External ear normal.     Nose: Nose normal.     Mouth/Throat:     Pharynx: Oropharynx is clear.  Eyes:     Pupils: Pupils are equal, round, and reactive to light.  Cardiovascular:     Rate and Rhythm: Normal rate and regular rhythm.  Pulmonary:     Effort: Pulmonary effort is normal.     Breath sounds: Normal breath sounds.  Abdominal:     General: Bowel sounds are normal.     Palpations: Abdomen is soft.  Musculoskeletal:     Cervical back: Normal range of motion.     Comments: Swelling and tenderness right knee without overlying erythema Some swelling and tenderness right foot Swelling and tenderness left elbow Shoulder does not show any evidence of swelling or abnormality.  Skin:    General: Skin is warm and dry.     Capillary Refill: Capillary refill takes less than 2 seconds.  Neurological:     General: No focal deficit present.     Mental Status: She is alert.  Psychiatric:        Mood and Affect: Mood normal.        Behavior: Behavior normal.    ED Results / Procedures / Treatments   Labs (all labs ordered are listed, but only abnormal results are displayed) Labs Reviewed  CBC  BASIC METABOLIC PANEL    EKG None  Radiology No results found.  Procedures Procedures   Medications Ordered in  ED Medications - No data to display  ED Course  I have reviewed the triage vital signs and the nursing notes.  Pertinent labs & imaging results that were available during my care of the patient were reviewed by me and considered in my medical decision making (see chart  for details).    MDM Rules/Calculators/A&P                          40 year old female with recent admission for gout and acute kidney injury.  Returns today with ongoing gout pain.  Today she has pain in the right knee that has been present and was tapped while she was in the hospital.  There is no acute infection.  She also has some left elbow pain today consistent with her gout.  She does have an elevated white blood cell count at 20,000.  However she has been on prednisone was given extra dose of prednisone here.  I feel this is likely secondary to steroid use.  It is very reassuring that she had a joint tapped and it did not grow out anything. She has a creatinine of 1.5 which is improved from her discharge creatinine of 1.8.  She does have outpatient follow-up.  Is given extra dose of prednisone here for pain and a dose of Percocet.  We have discussed return precautions and need for close outpatient follow-up and she voices understanding.  Plan pain medicine, dose of prednisone, and will check renal function Final Clinical Impression(s) / ED Diagnoses Final diagnoses:  Chronic gouty arthritis    Rx / DC Orders ED Discharge Orders     None        Margarita Grizzle, MD 03/24/21 1519

## 2021-03-24 NOTE — ED Triage Notes (Signed)
Pt states she was admitted on 9/3 for AKI and gout.  Reports continued swelling to R foot since she was admitted.  Reports pain to bilateral shoulders this morning and swelling to L elbow.   No known injury.

## 2021-03-24 NOTE — Discharge Instructions (Addendum)
Please continue your home medications for gout Continue to drink plenty of fluids You are given an extra dose of prednisone and Percocet here in the ED. Please call your doctor and have follow-up early next week for recheck.

## 2021-03-28 ENCOUNTER — Ambulatory Visit: Payer: Self-pay | Admitting: Nurse Practitioner

## 2021-03-28 ENCOUNTER — Encounter (HOSPITAL_COMMUNITY): Payer: Self-pay | Admitting: Emergency Medicine

## 2021-03-28 ENCOUNTER — Emergency Department (HOSPITAL_COMMUNITY): Payer: Medicaid Other

## 2021-03-28 ENCOUNTER — Inpatient Hospital Stay (HOSPITAL_COMMUNITY)
Admission: EM | Admit: 2021-03-28 | Discharge: 2021-03-31 | DRG: 549 | Disposition: A | Discharge status: Home / Self Care | Payer: Medicaid Other | Attending: Family Medicine | Admitting: Family Medicine

## 2021-03-28 DIAGNOSIS — A419 Sepsis, unspecified organism: Secondary | ICD-10-CM | POA: Diagnosis present

## 2021-03-28 DIAGNOSIS — Z6834 Body mass index (BMI) 34.0-34.9, adult: Secondary | ICD-10-CM

## 2021-03-28 DIAGNOSIS — D649 Anemia, unspecified: Secondary | ICD-10-CM | POA: Diagnosis present

## 2021-03-28 DIAGNOSIS — Z8673 Personal history of transient ischemic attack (TIA), and cerebral infarction without residual deficits: Secondary | ICD-10-CM

## 2021-03-28 DIAGNOSIS — Z886 Allergy status to analgesic agent status: Secondary | ICD-10-CM

## 2021-03-28 DIAGNOSIS — I129 Hypertensive chronic kidney disease with stage 1 through stage 4 chronic kidney disease, or unspecified chronic kidney disease: Secondary | ICD-10-CM | POA: Diagnosis present

## 2021-03-28 DIAGNOSIS — N1831 Chronic kidney disease, stage 3a: Secondary | ICD-10-CM | POA: Diagnosis present

## 2021-03-28 DIAGNOSIS — Z79899 Other long term (current) drug therapy: Secondary | ICD-10-CM

## 2021-03-28 DIAGNOSIS — M1990 Unspecified osteoarthritis, unspecified site: Secondary | ICD-10-CM

## 2021-03-28 DIAGNOSIS — Z8249 Family history of ischemic heart disease and other diseases of the circulatory system: Secondary | ICD-10-CM

## 2021-03-28 DIAGNOSIS — R6510 Systemic inflammatory response syndrome (SIRS) of non-infectious origin without acute organ dysfunction: Secondary | ICD-10-CM

## 2021-03-28 DIAGNOSIS — Z8616 Personal history of COVID-19: Secondary | ICD-10-CM

## 2021-03-28 DIAGNOSIS — F17200 Nicotine dependence, unspecified, uncomplicated: Secondary | ICD-10-CM | POA: Diagnosis present

## 2021-03-28 DIAGNOSIS — K219 Gastro-esophageal reflux disease without esophagitis: Secondary | ICD-10-CM | POA: Diagnosis present

## 2021-03-28 DIAGNOSIS — F17210 Nicotine dependence, cigarettes, uncomplicated: Secondary | ICD-10-CM | POA: Diagnosis present

## 2021-03-28 DIAGNOSIS — M109 Gout, unspecified: Secondary | ICD-10-CM

## 2021-03-28 DIAGNOSIS — D509 Iron deficiency anemia, unspecified: Secondary | ICD-10-CM | POA: Diagnosis present

## 2021-03-28 DIAGNOSIS — J45909 Unspecified asthma, uncomplicated: Secondary | ICD-10-CM | POA: Diagnosis present

## 2021-03-28 DIAGNOSIS — R509 Fever, unspecified: Secondary | ICD-10-CM

## 2021-03-28 DIAGNOSIS — M1A9XX0 Chronic gout, unspecified, without tophus (tophi): Secondary | ICD-10-CM | POA: Diagnosis present

## 2021-03-28 DIAGNOSIS — M009 Pyogenic arthritis, unspecified: Principal | ICD-10-CM | POA: Diagnosis present

## 2021-03-28 DIAGNOSIS — U071 COVID-19: Secondary | ICD-10-CM | POA: Diagnosis present

## 2021-03-28 DIAGNOSIS — Z20822 Contact with and (suspected) exposure to covid-19: Secondary | ICD-10-CM | POA: Diagnosis present

## 2021-03-28 DIAGNOSIS — M25422 Effusion, left elbow: Secondary | ICD-10-CM

## 2021-03-28 DIAGNOSIS — M064 Inflammatory polyarthropathy: Secondary | ICD-10-CM | POA: Diagnosis present

## 2021-03-28 DIAGNOSIS — M2550 Pain in unspecified joint: Secondary | ICD-10-CM

## 2021-03-28 DIAGNOSIS — N183 Chronic kidney disease, stage 3 unspecified: Secondary | ICD-10-CM | POA: Diagnosis present

## 2021-03-28 LAB — CBC WITH DIFFERENTIAL/PLATELET
Abs Immature Granulocytes: 0.23 10*3/uL — ABNORMAL HIGH (ref 0.00–0.07)
Basophils Absolute: 0 10*3/uL (ref 0.0–0.1)
Basophils Relative: 0 %
Eosinophils Absolute: 0 10*3/uL (ref 0.0–0.5)
Eosinophils Relative: 0 %
HCT: 32.6 % — ABNORMAL LOW (ref 36.0–46.0)
Hemoglobin: 10.4 g/dL — ABNORMAL LOW (ref 12.0–15.0)
Immature Granulocytes: 1 %
Lymphocytes Relative: 2 %
Lymphs Abs: 0.4 10*3/uL — ABNORMAL LOW (ref 0.7–4.0)
MCH: 24.7 pg — ABNORMAL LOW (ref 26.0–34.0)
MCHC: 31.9 g/dL (ref 30.0–36.0)
MCV: 77.4 fL — ABNORMAL LOW (ref 80.0–100.0)
Monocytes Absolute: 1.2 10*3/uL — ABNORMAL HIGH (ref 0.1–1.0)
Monocytes Relative: 5 %
Neutro Abs: 22.1 10*3/uL — ABNORMAL HIGH (ref 1.7–7.7)
Neutrophils Relative %: 92 %
Platelets: 420 10*3/uL — ABNORMAL HIGH (ref 150–400)
RBC: 4.21 MIL/uL (ref 3.87–5.11)
RDW: 14.8 % (ref 11.5–15.5)
WBC: 23.9 10*3/uL — ABNORMAL HIGH (ref 4.0–10.5)
nRBC: 0 % (ref 0.0–0.2)

## 2021-03-28 LAB — BASIC METABOLIC PANEL
Anion gap: 15 (ref 5–15)
BUN: 25 mg/dL — ABNORMAL HIGH (ref 6–20)
CO2: 22 mmol/L (ref 22–32)
Calcium: 9.4 mg/dL (ref 8.9–10.3)
Chloride: 96 mmol/L — ABNORMAL LOW (ref 98–111)
Creatinine, Ser: 1.48 mg/dL — ABNORMAL HIGH (ref 0.44–1.00)
GFR, Estimated: 45 mL/min — ABNORMAL LOW (ref >60)
Glucose, Bld: 112 mg/dL — ABNORMAL HIGH (ref 70–99)
Potassium: 3.7 mmol/L (ref 3.5–5.1)
Sodium: 133 mmol/L — ABNORMAL LOW (ref 135–145)

## 2021-03-28 LAB — SEDIMENTATION RATE: Sed Rate: 110 mm/hr — ABNORMAL HIGH (ref 0–22)

## 2021-03-28 LAB — HCG, QUANTITATIVE, PREGNANCY: hCG, Beta Chain, Quant, S: 1 m[IU]/mL (ref <5)

## 2021-03-28 LAB — C-REACTIVE PROTEIN: CRP: 33.2 mg/dL — ABNORMAL HIGH (ref <1.0)

## 2021-03-28 MED ORDER — ACETAMINOPHEN 325 MG PO TABS
650.0000 mg | ORAL_TABLET | Freq: Once | ORAL | Status: AC
  Administered 2021-03-28: 650 mg via ORAL
  Filled 2021-03-28: qty 2

## 2021-03-28 MED ORDER — EPINEPHRINE 0.3 MG/0.3ML IJ SOAJ: 0.3000 mg | Freq: Once | INTRAMUSCULAR | Status: DC

## 2021-03-28 NOTE — ED Triage Notes (Signed)
Pt reports possible gout flare to left arm and right hip that began yesterday.

## 2021-03-28 NOTE — ED Provider Notes (Signed)
Emergency Medicine Provider Triage Evaluation Note  Suzanne Graham , a 40 y.o. female  was evaluated in triage.  Pt complains of left wrist, left elbow, and right hip pain.  Reports swelling to left wrist and left elbow denies any swelling, erythema or rash to right hip.  No recent falls or injuries.  Denies any recent falls or injuries.  Patient reports that she has a positive for COVID-19 2 weeks prior.  Review of Systems  Positive: Arthralgia, myalgia, joint swelling, fever Negative: Numbness, weakness.  Physical Exam  BP 121/85 (BP Location: Right Arm)   Pulse (!) 111   Temp (!) 101.5 F (38.6 C) (Oral)   Resp 18   Ht 5\' 4"  (1.626 m)   Wt 90.7 kg   SpO2 100%   BMI 34.33 kg/m  Gen:   Awake, no distress   Resp:  Normal effort  MSK:   Moves extremities without difficulty, swelling noted to the left wrist and elbow.  Tenderness to palpation of the left wrist, and right hand.  Decreased range of motion secondary to complaints of pain. Other:  +2 radial pulse bilaterally.  Medical Decision Making  Medically screening exam initiated at 2:17 PM.  Appropriate orders placed.  Suzanne Graham was informed that the remainder of the evaluation will be completed by another provider, this initial triage assessment does not replace that evaluation, and the importance of remaining in the ED until their evaluation is complete.  The patient appears stable so that the remainder of the work up may be completed by another provider.      [name] 03/28/21 1420    03/30/21, MD 03/30/21 [phone]

## 2021-03-29 ENCOUNTER — Emergency Department (HOSPITAL_COMMUNITY): Payer: Medicaid Other

## 2021-03-29 ENCOUNTER — Inpatient Hospital Stay (HOSPITAL_COMMUNITY): Payer: Medicaid Other

## 2021-03-29 ENCOUNTER — Other Ambulatory Visit: Payer: Self-pay

## 2021-03-29 DIAGNOSIS — Z8616 Personal history of COVID-19: Secondary | ICD-10-CM | POA: Diagnosis not present

## 2021-03-29 DIAGNOSIS — M009 Pyogenic arthritis, unspecified: Secondary | ICD-10-CM | POA: Diagnosis present

## 2021-03-29 DIAGNOSIS — Z79899 Other long term (current) drug therapy: Secondary | ICD-10-CM | POA: Diagnosis not present

## 2021-03-29 DIAGNOSIS — A419 Sepsis, unspecified organism: Secondary | ICD-10-CM | POA: Diagnosis present

## 2021-03-29 DIAGNOSIS — D509 Iron deficiency anemia, unspecified: Secondary | ICD-10-CM

## 2021-03-29 DIAGNOSIS — R509 Fever, unspecified: Secondary | ICD-10-CM

## 2021-03-29 DIAGNOSIS — N1831 Chronic kidney disease, stage 3a: Secondary | ICD-10-CM | POA: Diagnosis present

## 2021-03-29 DIAGNOSIS — Z886 Allergy status to analgesic agent status: Secondary | ICD-10-CM | POA: Diagnosis not present

## 2021-03-29 DIAGNOSIS — Z20822 Contact with and (suspected) exposure to covid-19: Secondary | ICD-10-CM | POA: Diagnosis present

## 2021-03-29 DIAGNOSIS — Z6834 Body mass index (BMI) 34.0-34.9, adult: Secondary | ICD-10-CM | POA: Diagnosis not present

## 2021-03-29 DIAGNOSIS — M25422 Effusion, left elbow: Secondary | ICD-10-CM

## 2021-03-29 DIAGNOSIS — F17200 Nicotine dependence, unspecified, uncomplicated: Secondary | ICD-10-CM

## 2021-03-29 DIAGNOSIS — M1990 Unspecified osteoarthritis, unspecified site: Secondary | ICD-10-CM

## 2021-03-29 DIAGNOSIS — Z8249 Family history of ischemic heart disease and other diseases of the circulatory system: Secondary | ICD-10-CM | POA: Diagnosis not present

## 2021-03-29 DIAGNOSIS — M2550 Pain in unspecified joint: Secondary | ICD-10-CM

## 2021-03-29 DIAGNOSIS — I129 Hypertensive chronic kidney disease with stage 1 through stage 4 chronic kidney disease, or unspecified chronic kidney disease: Secondary | ICD-10-CM | POA: Diagnosis present

## 2021-03-29 DIAGNOSIS — K219 Gastro-esophageal reflux disease without esophagitis: Secondary | ICD-10-CM | POA: Diagnosis present

## 2021-03-29 DIAGNOSIS — R6510 Systemic inflammatory response syndrome (SIRS) of non-infectious origin without acute organ dysfunction: Secondary | ICD-10-CM | POA: Diagnosis present

## 2021-03-29 DIAGNOSIS — M1A9XX0 Chronic gout, unspecified, without tophus (tophi): Secondary | ICD-10-CM | POA: Diagnosis present

## 2021-03-29 DIAGNOSIS — J45909 Unspecified asthma, uncomplicated: Secondary | ICD-10-CM | POA: Diagnosis present

## 2021-03-29 DIAGNOSIS — N183 Chronic kidney disease, stage 3 unspecified: Secondary | ICD-10-CM | POA: Diagnosis present

## 2021-03-29 DIAGNOSIS — Z8673 Personal history of transient ischemic attack (TIA), and cerebral infarction without residual deficits: Secondary | ICD-10-CM | POA: Diagnosis not present

## 2021-03-29 DIAGNOSIS — D649 Anemia, unspecified: Secondary | ICD-10-CM | POA: Diagnosis present

## 2021-03-29 DIAGNOSIS — F17210 Nicotine dependence, cigarettes, uncomplicated: Secondary | ICD-10-CM | POA: Diagnosis present

## 2021-03-29 DIAGNOSIS — M064 Inflammatory polyarthropathy: Secondary | ICD-10-CM | POA: Diagnosis present

## 2021-03-29 LAB — URIC ACID: Uric Acid, Serum: 9.5 mg/dL — ABNORMAL HIGH (ref 2.5–7.1)

## 2021-03-29 LAB — APTT: aPTT: 29 seconds (ref 24–36)

## 2021-03-29 LAB — HEPATIC FUNCTION PANEL
ALT: 32 U/L (ref 0–44)
AST: 15 U/L (ref 15–41)
Albumin: 2.6 g/dL — ABNORMAL LOW (ref 3.5–5.0)
Alkaline Phosphatase: 149 U/L — ABNORMAL HIGH (ref 38–126)
Bilirubin, Direct: 0.3 mg/dL — ABNORMAL HIGH (ref 0.0–0.2)
Indirect Bilirubin: 0.8 mg/dL (ref 0.3–0.9)
Total Bilirubin: 1.1 mg/dL (ref 0.3–1.2)
Total Protein: 8.1 g/dL (ref 6.5–8.1)

## 2021-03-29 LAB — RESP PANEL BY RT-PCR (FLU A&B, COVID) ARPGX2
Influenza A by PCR: NEGATIVE
Influenza B by PCR: NEGATIVE
SARS Coronavirus 2 by RT PCR: NEGATIVE

## 2021-03-29 LAB — I-STAT BETA HCG BLOOD, ED (MC, WL, AP ONLY): I-stat hCG, quantitative: 18.9 m[IU]/mL — ABNORMAL HIGH (ref <5)

## 2021-03-29 LAB — URINALYSIS, ROUTINE W REFLEX MICROSCOPIC
Bilirubin Urine: NEGATIVE
Glucose, UA: NEGATIVE mg/dL
Hgb urine dipstick: NEGATIVE
Ketones, ur: NEGATIVE mg/dL
Nitrite: NEGATIVE
Protein, ur: NEGATIVE mg/dL
Specific Gravity, Urine: 1.015 (ref 1.005–1.030)
WBC, UA: >50 WBC/hpf — ABNORMAL HIGH (ref 0–5)
pH: 5 (ref 5.0–8.0)

## 2021-03-29 LAB — HCG, QUANTITATIVE, PREGNANCY: hCG, Beta Chain, Quant, S: 1 m[IU]/mL (ref <5)

## 2021-03-29 LAB — PROTIME-INR
INR: 1.2 (ref 0.8–1.2)
Prothrombin Time: 15 seconds (ref 11.4–15.2)

## 2021-03-29 LAB — SYNOVIAL FLUID, CRYSTAL: Crystals, Fluid: NONE SEEN

## 2021-03-29 LAB — LACTIC ACID, PLASMA: Lactic Acid, Venous: 1.7 mmol/L (ref 0.5–1.9)

## 2021-03-29 MED ORDER — LIDOCAINE HCL (PF) 1 % IJ SOLN
5.0000 mL | Freq: Once | INTRAMUSCULAR | Status: AC
  Administered 2021-03-29: 8 mL via INTRADERMAL
  Filled 2021-03-29: qty 5

## 2021-03-29 MED ORDER — LACTATED RINGERS IV SOLN: INTRAVENOUS | Status: AC

## 2021-03-29 MED ORDER — IOHEXOL 180 MG/ML  SOLN
20.0000 mL | Freq: Once | INTRAMUSCULAR | Status: AC | PRN
  Administered 2021-03-29: 12 mL via INTRA_ARTICULAR

## 2021-03-29 MED ORDER — ACETAMINOPHEN 325 MG PO TABS
650.0000 mg | ORAL_TABLET | Freq: Four times a day (QID) | ORAL | Status: DC | PRN
  Administered 2021-03-29 – 2021-03-31 (×6): 650 mg via ORAL
  Filled 2021-03-29 (×6): qty 2

## 2021-03-29 MED ORDER — ONDANSETRON HCL 4 MG/2ML IJ SOLN: 4.0000 mg | Freq: Four times a day (QID) | INTRAMUSCULAR | Status: DC | PRN

## 2021-03-29 MED ORDER — AMLODIPINE BESYLATE 10 MG PO TABS
10.0000 mg | ORAL_TABLET | Freq: Every day | ORAL | Status: DC
  Administered 2021-03-29 – 2021-03-31 (×3): 10 mg via ORAL
  Filled 2021-03-29 (×3): qty 1

## 2021-03-29 MED ORDER — ONDANSETRON HCL 4 MG PO TABS: 4.0000 mg | ORAL_TABLET | Freq: Four times a day (QID) | ORAL | Status: DC | PRN

## 2021-03-29 MED ORDER — COLCHICINE 0.6 MG PO TABS
0.6000 mg | ORAL_TABLET | Freq: Two times a day (BID) | ORAL | Status: DC
  Administered 2021-03-29 – 2021-03-31 (×5): 0.6 mg via ORAL
  Filled 2021-03-29 (×9): qty 1

## 2021-03-29 MED ORDER — HYDROMORPHONE HCL 1 MG/ML IJ SOLN
1.0000 mg | Freq: Once | INTRAMUSCULAR | Status: AC
  Administered 2021-03-29: 1 mg via INTRAVENOUS
  Filled 2021-03-29: qty 1

## 2021-03-29 MED ORDER — PREDNISONE 20 MG PO TABS
60.0000 mg | ORAL_TABLET | Freq: Every day | ORAL | Status: DC
  Administered 2021-03-29 – 2021-03-31 (×3): 60 mg via ORAL
  Filled 2021-03-29: qty 6
  Filled 2021-03-29 (×2): qty 3

## 2021-03-29 MED ORDER — SODIUM CHLORIDE 0.9 % IV SOLN: INTRAVENOUS | Status: DC

## 2021-03-29 MED ORDER — SODIUM CHLORIDE (PF) 0.9 % IJ SOLN
10.0000 mL | INTRAMUSCULAR | Status: DC | PRN
  Administered 2021-03-29: 8 mL
  Filled 2021-03-29: qty 10

## 2021-03-29 MED ORDER — ACETAMINOPHEN 650 MG RE SUPP: 650.0000 mg | Freq: Four times a day (QID) | RECTAL | Status: DC | PRN

## 2021-03-29 MED ORDER — HYDROMORPHONE HCL 1 MG/ML IJ SOLN
0.5000 mg | INTRAMUSCULAR | Status: AC | PRN
  Administered 2021-03-29 – 2021-03-30 (×4): 0.5 mg via INTRAVENOUS
  Filled 2021-03-29 (×5): qty 0.5

## 2021-03-29 MED ORDER — ALBUTEROL SULFATE (2.5 MG/3ML) 0.083% IN NEBU: 2.5000 mg | INHALATION_SOLUTION | Freq: Four times a day (QID) | RESPIRATORY_TRACT | Status: DC | PRN

## 2021-03-29 MED ORDER — SODIUM CHLORIDE 0.9% FLUSH
3.0000 mL | Freq: Two times a day (BID) | INTRAVENOUS | Status: DC
  Administered 2021-03-29 – 2021-03-30 (×4): 3 mL via INTRAVENOUS

## 2021-03-29 MED ORDER — LORAZEPAM 2 MG/ML IJ SOLN
1.0000 mg | Freq: Once | INTRAMUSCULAR | Status: AC
  Administered 2021-03-29: 1 mg via INTRAVENOUS
  Filled 2021-03-29: qty 1

## 2021-03-29 MED ORDER — SODIUM CHLORIDE (PF) 0.9 % IJ SOLN
10.0000 mL | INTRAMUSCULAR | Status: DC | PRN
  Filled 2021-03-29: qty 10

## 2021-03-29 MED ORDER — MORPHINE SULFATE (PF) 4 MG/ML IV SOLN: 4.0000 mg | Freq: Once | INTRAVENOUS | Status: DC

## 2021-03-29 MED ORDER — HYDRALAZINE HCL 20 MG/ML IJ SOLN: 10.0000 mg | INTRAMUSCULAR | Status: DC | PRN

## 2021-03-29 MED ORDER — ENOXAPARIN SODIUM 40 MG/0.4ML IJ SOSY
40.0000 mg | PREFILLED_SYRINGE | INTRAMUSCULAR | Status: DC
  Administered 2021-03-29 – 2021-03-31 (×3): 40 mg via SUBCUTANEOUS
  Filled 2021-03-29 (×3): qty 0.4

## 2021-03-29 NOTE — Consult Note (Signed)
[facility] for Infectious Disease    Date of Admission:  03/28/2021     Reason for Consult: Gout     Referring Physician: Dr Tamala Julian  Current antibiotics: None   ASSESSMENT:    40 y.o. female admitted with:  Polyarticular gout versus septic arthritis: She presents with fever and leukocytosis in the setting of recent gout flare treated with prednisone and an arthrocentesis 03/10/2021 which showed crystals.  Arthrocentesis at that time also showed 45,885 WBCs (96%), however, this improved with out antibiotics and her gram stain/cultures were negative.  It was felt that this was reactive in the setting of her gout flare.  MRI obtained today of the right hip has effusions and surrounding inflammation/edema.  This raised concern for septic arthritis and a possible adjacent abscess associated with the right L5-S1 facet joint.  MRI of the left elbow is also ordered to further evaluate for joint effusion there and the concern for septic arthritis.  Plan is for fluoroscopy guided aspiration of the right hip and left elbow.  Her history does not point to any unusual exposures.  Sepsis physiology secondary to #1 Hx of gout HTN CKD: Creatinine appears at baseline 1.4.  Previously had AKI thought 2/2 NSAIDs Hx of CVA (07/2017)  RECOMMENDATIONS:    Presentation may be all secondary to gout given her history.  Will hold off antibiotics pending joint aspirations to increase microbiologic yield.  Follow-up cell count with crystal analysis and gram stain/cultures. Blood cultures. Agree with checking GC/CT. She would benefit from rheumatology evaluation.   Principal Problem:   Sepsis ([place]) Active Problems:   Current smoker   Septic arthritis ([place])   CKD (chronic kidney disease), stage III (HCC)   MEDICATIONS:    Scheduled Meds:  amLODipine  10 mg Oral Daily   colchicine  0.6 mg Oral BID   enoxaparin (LOVENOX) injection  40 mg Subcutaneous Q24H   predniSONE  60 mg Oral Q breakfast    sodium chloride flush  3 mL Intravenous Q12H   Continuous Infusions:  sodium chloride     lactated ringers 150 mL/hr at 03/29/21 0415   PRN Meds:.acetaminophen **OR** acetaminophen, albuterol, HYDROmorphone (DILAUDID) injection, ondansetron **OR** ondansetron (ZOFRAN) IV  HPI:    Suzanne Graham is a 40 y.o. female with a past medical history significant for CVA (January 2019), hypertension, polyarticular gout, and several recent emergency department visits secondary to her joint pains who presented yesterday with worsening complaint of pain and swelling in multiple joints including her right hip, left shoulder, left wrist, left digits.  The symptoms have been waxing and waning over the last month and have been affecting several different joints.  She was seen in the emergency department 03/05/2021 for recurrent symptoms of gout.  This included swelling to the left toe, left ankle, left elbow.  These were joints that had been involved with her gout exacerbations previously.  She was treated with prednisone and Percocet given prior relief with these medications.  She returned to the ER 2 days later due to lack of improvement.  Labs at that time were notable for elevated inflammatory markers, leukocytosis, and mild AKI.  Overall it was felt that her presentation was consistent with gout and she was recommended outpatient follow-up after lower extremity DVT study was negative.  She was seen 9/2 by her PCP and referred to rheumatology whom she has an appointment with on 04/19/2021.  Unfortunately she was admitted to this hospital 9/3 to 03/12/2021 with continued concerns for  worsening joint pain/gout flare.  She was found to have worsening AKI with creatinine 2.8 (baseline 1.3-1.4).  She underwent right knee arthrocentesis in the ER which showed monosodium urate crystals confirming the diagnosis of gouty arthritis of the right knee.  She was started on prednisone.  She also incidentally tested positive for  COVID-19 without any respiratory symptoms.  Her synovial fluid showed 45,000 white cells which was predominantly neutrophils.  This was discussed with the on-call ID provider who felt that this was likely secondary to gout and not septic arthritis since she improved without antibiotics and her Gram staining cultures were all negative.  She ended up being discharged with a prednisone taper and seems to have been given cefdinir for 5 days which she may or may not have taken due to the possibility of a developing pneumonia.  She once again was seen in the emergency department 03/24/2021 with ongoing joint pain in her right knee in addition to left elbow swelling.  She also complained of bilateral shoulder discomfort.  It was felt that this did not correlate to an infection and that this was due to her gout.  Her her WBC was elevated but this was thought to be secondary to steroid use.  Unfortunately, she presented back to the emergency overnight.  She was febrile upwards of 101.5 F, WBC 23.9, BUN 25 creatinine 1.4, albumin 2.6, lactate 1.7.  CRP 33.2 and ESR 110.  She underwent several imaging studies due to her noted complaints.  MRI was significant for inflammation and effusions of the bilateral SI joint concerning for septic arthritis.  X-ray of the left elbow was also notable for a large joint effusion and MRI is pending to determine if there is possible radiographic findings of septic arthritis as well.  She reports being sexually active with 1 partner.  She has no GU symptoms and reports that she is not concerned for an STI.  Her HIV testing was negative earlier this month on 9/4.  She otherwise has no travel recently no recent outdoor exposures, no tick bites.  She reports that she has not really been able to go anywhere secondary to her pain.  She denies any history of injection drug use.  In reviewing her chart, during her admission for CVA in January 2019 her work-up was notable for ANA positivity with  SSA (Ro) antibodies 1.5.  Rheumatoid factor last month was 20.9.   Past Medical History:  Diagnosis Date   Acid reflux 08/2019   Asthma    Depression    Gout    Hypertension    Stroke [facility])     Social History   Tobacco Use   Smoking status: Every Day    Packs/day: 0.25    Types: Cigarettes   Smokeless tobacco: Never  Vaping Use   Vaping Use: Never used  Substance Use Topics   Alcohol use: Yes    Comment: rare   Drug use: No    Family History  Problem Relation Age of Onset   Heart failure Mother    Hypertension Mother    Stroke Neg Hx     Allergies  Allergen Reactions   Nsaids Other (See Comments)    Likely causing AKI    Review of Systems  Constitutional:  Positive for fever. Negative for malaise/fatigue.  Gastrointestinal: Negative.   Genitourinary: Negative.   Musculoskeletal:  Positive for joint pain.  Skin:  Negative for rash.  All other systems reviewed and are negative.  OBJECTIVE:  Blood pressure (!) 124/92, pulse (!) 102, temperature 99.1 F (37.3 C), temperature source Oral, resp. rate 20, height '5\' 4"'  (1.626 m), weight 90.7 kg, SpO2 99 %. Body mass index is 34.33 kg/m.  Physical Exam Constitutional:      General: She is not in acute distress.    Appearance: Normal appearance.     Comments: She is somnolent but alert and arousable.  Eyes:     Extraocular Movements: Extraocular movements intact.     Conjunctiva/sclera: Conjunctivae normal.  Cardiovascular:     Rate and Rhythm: Normal rate and regular rhythm.     Heart sounds: No murmur heard. Pulmonary:     Effort: Pulmonary effort is normal. No respiratory distress.  Abdominal:     General: There is no distension.     Palpations: Abdomen is soft.     Tenderness: There is no abdominal tenderness.  Musculoskeletal:     Cervical back: Normal range of motion and neck supple.     Comments: Left wrist/digits, elbow, shoulder with diffuse TTP.  Unable to make fist and raise arm much 2/2  pain.  She denies weakness.  No knee swelling or effusion.  Mild swelling in feet.  Skin:    General: Skin is warm and dry.     Findings: No rash.  Neurological:     General: No focal deficit present.     Mental Status: She is alert and oriented to person, place, and time.  Psychiatric:        Mood and Affect: Mood normal.        Behavior: Behavior normal.     Lab Results: Lab Results  Component Value Date   WBC 23.9 (H) 03/28/2021   HGB 10.4 (L) 03/28/2021   HCT 32.6 (L) 03/28/2021   MCV 77.4 (L) 03/28/2021   PLT 420 (H) 03/28/2021    Lab Results  Component Value Date   NA 133 (L) 03/28/2021   K 3.7 03/28/2021   CO2 22 03/28/2021   GLUCOSE 112 (H) 03/28/2021   BUN 25 (H) 03/28/2021   CREATININE 1.48 (H) 03/28/2021   CALCIUM 9.4 03/28/2021   GFRNONAA 45 (L) 03/28/2021   GFRAA 72 11/15/2019    Lab Results  Component Value Date   ALT 32 03/29/2021   AST 15 03/29/2021   ALKPHOS 149 (H) 03/29/2021   BILITOT 1.1 03/29/2021       Component Value Date/Time   CRP 33.2 (H) 03/28/2021 1426       Component Value Date/Time   ESRSEDRATE 110 (H) 03/28/2021 1426    I have reviewed the micro and lab results in Epic.  Imaging: DG ELBOW COMPLETE LEFT (3+VIEW)  Result Date: 03/28/2021 CLINICAL DATA:  Left elbow pain over the past several weeks without acute injury, initial encounter EXAM: LEFT ELBOW - COMPLETE 3+ VIEW COMPARISON:  None. FINDINGS: No acute fracture or dislocation is noted. Considerable soft tissue swelling is noted over the olecranon in the distal humerus posteriorly. IMPRESSION: Soft tissue swelling without acute bony abnormality. Electronically Signed   By: Inez Catalina M.D.   On: 03/28/2021 18:30   DG Wrist Complete Left  Result Date: 03/28/2021 CLINICAL DATA:  Left wrist pain for several weeks, no known injury, initial encounter EXAM: LEFT WRIST - COMPLETE 3+ VIEW COMPARISON:  None. FINDINGS: There is no evidence of fracture or dislocation. There is no  evidence of arthropathy or other focal bone abnormality. Soft tissues are unremarkable. IMPRESSION: No acute abnormality noted. Electronically Signed  By: Inez Catalina M.D.   On: 03/28/2021 18:27   MR HIP RIGHT WO CONTRAST  Result Date: 03/29/2021 CLINICAL DATA:  Right hip pain elbow pain and wrist pain and swelling. EXAM: MR OF THE RIGHT HIP WITHOUT CONTRAST TECHNIQUE: Multiplanar, multisequence MR imaging was performed. No intravenous contrast was administered. COMPARISON:  None. FINDINGS: Examination is quite limited and patient would not complete the exam. There are bilateral SI joint effusions. There is also some surrounding inflammation/edema worrisome for septic arthritis. I do not see any significant or obvious bony changes of osteomyelitis. Small bilateral hip joint effusions are also noted. The pubic symphysis is unremarkable. Inflammation/edema is noted in the lower paravertebral muscles and there is a fluid collection associated with the right L5-S1 facet joint. Could not exclude septic arthritis and focal abscess. Lumbar spine MRI without and with contrast may be helpful for further evaluation. I reviewed the elbow films from 03/28/2021 and the patient definitely has a large elbow joint effusion and I would be worried about septic arthritis at this joint also. MR imaging is suggested if able. IMPRESSION: 1. Bilateral SI joint effusions and surrounding inflammation/edema worrisome for septic arthritis. No obvious bony changes of osteomyelitis. 2. Small bilateral hip joint effusions. 3. Findings worrisome for septic arthritis and possible adjacent abscess associated with the right L5-S1 facet joint. 4. Reviewing the patient's left elbow radiographs, there is a large joint effusion and I would be worried about septic arthritis at this joint also. Electronically Signed   By: Marijo Sanes M.D.   On: 03/29/2021 09:30   DG Chest Port 1 View  Result Date: 03/29/2021 CLINICAL DATA:  Questionable sepsis  EXAM: PORTABLE CHEST 1 VIEW COMPARISON:  03/11/2021 FINDINGS: Loop recorder overlies the left heart border. The heart size and mediastinal contours are within normal limits. No focal pulmonary opacity. No pleural effusion or pneumothorax. The visualized skeletal structures are unremarkable. IMPRESSION: No active disease. Electronically Signed   By: Merilyn Baba M.D.   On: 03/29/2021 03:53   DG Hip Unilat W or Wo Pelvis 2-3 Views Right  Result Date: 03/28/2021 CLINICAL DATA:  Right hip pain. EXAM: DG HIP (WITH OR WITHOUT PELVIS) 2-3V RIGHT COMPARISON:  None. FINDINGS: There is no acute fracture or dislocation. The bones are well mineralized. No significant arthritic changes. The soft tissues are unremarkable. IMPRESSION: Negative. Electronically Signed   By: Anner Crete M.D.   On: 03/28/2021 19:52     Imaging independently reviewed in Epic.  Raynelle [facility] for Infectious Disease [facility] [phone] pager 03/29/2021, 2:29 PM

## 2021-03-29 NOTE — Plan of Care (Signed)
  Problem: Activity: Goal: Risk for activity intolerance will decrease Outcome: Progressing   Problem: Pain Managment: Goal: General experience of comfort will improve Outcome: Progressing   

## 2021-03-29 NOTE — Progress Notes (Signed)
Pt extremely uncooperative. We were able to get some hip imaging done before she refused to continue. Hip images sent through and exam completed. Unable to try elbow exam.

## 2021-03-29 NOTE — ED Notes (Signed)
Pt stated she her left arm is swollen and hip pain starting 03/28/2021. She is unsure if it is related to gout. Pt stated she is unable to walk. Family stated she believes she has had a stroke.

## 2021-03-29 NOTE — ED Provider Notes (Signed)
Care assumed from [facility], PA-C, at shift change, please see their notes for full documentation of patient's complaint/HPI. Briefly, this is a 40 year old female presenting to the emergency department for right hip pain/left elbow and wrist pain and swelling and fever.  Results so far with WBC 23.9 with left shift, elevated sed rate and CRP, lactic negative.  Plan to obtain MRI right hip and left elbow, ED Ortho consult for tap, likely medicine admission for sepsis work-up. Physical Exam  BP 125/77   Pulse 98   Temp 98.7 F (37.1 C)   Resp (!) 25   Ht 5\' 4"  (1.626 m)   Wt 90.7 kg   SpO2 100%   BMI 34.33 kg/m   Physical Exam Vitals and nursing note reviewed.  Constitutional:      General: She is not in acute distress.    Appearance: Normal appearance.  HENT:     Head: Normocephalic and atraumatic.     Mouth/Throat:     Mouth: Mucous membranes are moist.     Pharynx: Oropharynx is clear.  Eyes:     General: No scleral icterus.    Pupils: Pupils are equal, round, and reactive to light.  Cardiovascular:     Rate and Rhythm: Normal rate and regular rhythm.     Pulses: Normal pulses.     Heart sounds: Normal heart sounds.  Pulmonary:     Effort: Pulmonary effort is normal. No respiratory distress.  Abdominal:     Palpations: Abdomen is soft.  Musculoskeletal:        General: Tenderness present.     Left elbow: Decreased range of motion. Tenderness present.     Cervical back: Normal range of motion and neck supple. No tenderness.     Right hip: Tenderness present. Decreased range of motion.     Left hip: Normal.  Skin:    General: Skin is warm and dry.     Capillary Refill: Capillary refill takes less than 2 seconds.     Findings: No rash.  Neurological:     General: No focal deficit present.     Mental Status: She is alert and oriented to person, place, and time.  Psychiatric:        Mood and Affect: Mood normal.        Behavior: Behavior normal.        Thought  Content: Thought content normal.        Judgment: Judgment normal.    ED Course/Procedures   Procedures Results for orders placed or performed during the hospital encounter of 03/28/21 (from the past 24 hour(s))  CBC with Differential     Status: Abnormal   Collection Time: 03/28/21  2:26 PM  Result Value Ref Range   WBC 23.9 (H) 4.0 - 10.5 K/uL   RBC 4.21 3.87 - 5.11 MIL/uL   Hemoglobin 10.4 (L) 12.0 - 15.0 g/dL   HCT 03/30/21 (L) 19.1 - 47.8 %   MCV 77.4 (L) 80.0 - 100.0 fL   MCH 24.7 (L) 26.0 - 34.0 pg   MCHC 31.9 30.0 - 36.0 g/dL   RDW 29.5 62.1 - 30.8 %   Platelets 420 (H) 150 - 400 K/uL   nRBC 0.0 0.0 - 0.2 %   Neutrophils Relative % 92 %   Neutro Abs 22.1 (H) 1.7 - 7.7 K/uL   Lymphocytes Relative 2 %   Lymphs Abs 0.4 (L) 0.7 - 4.0 K/uL   Monocytes Relative 5 %   Monocytes Absolute 1.2 (  H) 0.1 - 1.0 K/uL   Eosinophils Relative 0 %   Eosinophils Absolute 0.0 0.0 - 0.5 K/uL   Basophils Relative 0 %   Basophils Absolute 0.0 0.0 - 0.1 K/uL   Immature Granulocytes 1 %   Abs Immature Granulocytes 0.23 (H) 0.00 - 0.07 K/uL  Basic metabolic panel     Status: Abnormal   Collection Time: 03/28/21  2:26 PM  Result Value Ref Range   Sodium 133 (L) 135 - 145 mmol/L   Potassium 3.7 3.5 - 5.1 mmol/L   Chloride 96 (L) 98 - 111 mmol/L   CO2 22 22 - 32 mmol/L   Glucose, Bld 112 (H) 70 - 99 mg/dL   BUN 25 (H) 6 - 20 mg/dL   Creatinine, Ser 9.38 (H) 0.44 - 1.00 mg/dL   Calcium 9.4 8.9 - 18.2 mg/dL   GFR, Estimated 45 (L) >60 mL/min   Anion gap 15 5 - 15  Sedimentation rate     Status: Abnormal   Collection Time: 03/28/21  2:26 PM  Result Value Ref Range   Sed Rate 110 (H) 0 - 22 mm/hr  C-reactive protein     Status: Abnormal   Collection Time: 03/28/21  2:26 PM  Result Value Ref Range   CRP 33.2 (H) <1.0 mg/dL  hCG, quantitative, pregnancy     Status: None   Collection Time: 03/28/21  2:26 PM  Result Value Ref Range   hCG, Beta Chain, Quant, S 1 <5 mIU/mL  Resp Panel by RT-PCR  (Flu A&B, Covid) Nasopharyngeal Swab     Status: None   Collection Time: 03/29/21  4:09 AM   Specimen: Nasopharyngeal Swab; Nasopharyngeal(NP) swabs in vial transport medium  Result Value Ref Range   SARS Coronavirus 2 by RT PCR NEGATIVE NEGATIVE   Influenza A by PCR NEGATIVE NEGATIVE   Influenza B by PCR NEGATIVE NEGATIVE  Lactic acid, plasma     Status: None   Collection Time: 03/29/21  4:09 AM  Result Value Ref Range   Lactic Acid, Venous 1.7 0.5 - 1.9 mmol/L  Protime-INR     Status: None   Collection Time: 03/29/21  4:09 AM  Result Value Ref Range   Prothrombin Time 15.0 11.4 - 15.2 seconds   INR 1.2 0.8 - 1.2  APTT     Status: None   Collection Time: 03/29/21  4:09 AM  Result Value Ref Range   aPTT 29 24 - 36 seconds  Hepatic function panel     Status: Abnormal   Collection Time: 03/29/21  4:09 AM  Result Value Ref Range   Total Protein 8.1 6.5 - 8.1 g/dL   Albumin 2.6 (L) 3.5 - 5.0 g/dL   AST 15 15 - 41 U/L   ALT 32 0 - 44 U/L   Alkaline Phosphatase 149 (H) 38 - 126 U/L   Total Bilirubin 1.1 0.3 - 1.2 mg/dL   Bilirubin, Direct 0.3 (H) 0.0 - 0.2 mg/dL   Indirect Bilirubin 0.8 0.3 - 0.9 mg/dL  I-Stat Beta hCG blood, ED ([facility], [facility], [facility] only)     Status: Abnormal   Collection Time: 03/29/21  5:35 AM  Result Value Ref Range   I-stat hCG, quantitative 18.9 (H) <5 mIU/mL   Comment 3            Radiology DG ELBOW COMPLETE LEFT (3+VIEW)   Result Date: 03/28/2021 CLINICAL DATA:  Left elbow pain over the past several weeks without acute injury, initial encounter EXAM: LEFT ELBOW -  COMPLETE 3+ VIEW COMPARISON:  None. FINDINGS: No acute fracture or dislocation is noted. Considerable soft tissue swelling is noted over the olecranon in the distal humerus posteriorly. IMPRESSION: Soft tissue swelling without acute bony abnormality. Electronically Signed   By: Alcide Clever M.D.   On: 03/28/2021 18:30    DG Wrist Complete Left   Result Date: 03/28/2021 CLINICAL DATA:  Left wrist pain for  several weeks, no known injury, initial encounter EXAM: LEFT WRIST - COMPLETE 3+ VIEW COMPARISON:  None. FINDINGS: There is no evidence of fracture or dislocation. There is no evidence of arthropathy or other focal bone abnormality. Soft tissues are unremarkable. IMPRESSION: No acute abnormality noted. Electronically Signed   By: Alcide Clever M.D.   On: 03/28/2021 18:27    DG Chest Port 1 View   Result Date: 03/29/2021 CLINICAL DATA:  Questionable sepsis EXAM: PORTABLE CHEST 1 VIEW COMPARISON:  03/11/2021 FINDINGS: Loop recorder overlies the left heart border. The heart size and mediastinal contours are within normal limits. No focal pulmonary opacity. No pleural effusion or pneumothorax. The visualized skeletal structures are unremarkable. IMPRESSION: No active disease. Electronically Signed   By: Wiliam Ke M.D.   On: 03/29/2021 03:53    DG Hip Unilat W or Wo Pelvis 2-3 Views Right   Result Date: 03/28/2021 CLINICAL DATA:  Right hip pain. EXAM: DG HIP (WITH OR WITHOUT PELVIS) 2-3V RIGHT COMPARISON:  None. FINDINGS: There is no acute fracture or dislocation. The bones are well mineralized. No significant arthritic changes. The soft tissues are unremarkable. IMPRESSION: Negative. Electronically Signed   By: Elgie Collard M.D.   On: 03/28/2021 19:52    MR Hip Right Wo Contrast 03/29/21  IMPRESSION:  1. Bilateral SI joint effusions and surrounding inflammation/edema  worrisome for septic arthritis. No obvious bony changes of  osteomyelitis.  2. Small bilateral hip joint effusions.  3. Findings worrisome for septic arthritis and possible adjacent  abscess associated with the right L5-S1 facet joint.  4. Reviewing the patient's left elbow radiographs, there is a large  joint effusion and I would be worried about septic arthritis at this  joint also.   MDM  0830: Spoke with Charma Igo, PA with orthopedics who requests we consult with IR for joint tap. 0850: Spoke with Dr. Grace Isaac with IR  who is asking that we consult Fluoro IR specifically Dr. Chilton Si  [phone]: Consulted Dr. Chilton Si  [phone]: re-discussed case with Charma Igo, PA who states he will come see pt in ED for consult. Spoke with Dr. Chilton Si who states he will tap right hip under fluoro if MRI shows effusion.  [id number]: Spoke with Earney Hamburg, PA at bedside who will defer to IR for elbow tap while we are tapping right hip.  MRI of the right hip shows bilateral SI joint effusions worrisome for septic arthritis.  Additionally small bilateral hip joint effusions, possible abscess associated with the right L5-S1 facet joint.  Will admit to medicine plan for IR under fluoroscopy with Dr. Chilton Si. 1017: Spoke with [facility] hospitalist who agrees to admit patient. I will place orders for Fluoro guided hip tap once admitted.        Cristopher Peru, PA-C 03/29/21 1018    Tilden Fossa, MD 03/30/21 [phone]

## 2021-03-29 NOTE — Procedures (Signed)
Pre procedural Dx: Left elbow joint effusion Post procedural Dx: Same  Technically successful fluoro guided lavage aspiration of the left elbow joint. The aspirated sample was capped and sent to the laboratory for analysis.    EBL: Trace Complications: None immediate  Katherina Right, MD Pager #: [phone]

## 2021-03-29 NOTE — ED Notes (Signed)
Placed a external cath on patient  

## 2021-03-29 NOTE — H&P (Addendum)
History and Physical    Suzanne Graham MGN:[id number] DOB: 08-19-1980 DOA: 03/28/2021  Referring MD/NP/PA: Theodis Blaze, PA-C PCP: [name], FNP (Inactive)  Patient coming from: Home  Chief Complaint: Joint pain  I have personally briefly reviewed patient's old medical records in [place]   HPI: Suzanne Graham is a 40 y.o. female with medical history significant of hypertension, asthma, CVA, polyarticular gout, and hypertension who presents with complaints of pain and swelling in multiple joints including her right hip, left shoulder,  left elbow, left wrist, and left hand.  She reports that the symptoms have been waxing and waning over the last month and have been affecting different joints.  She had been seen in the emergency department multiple times started on 8/29 treated with pain medications and steroids. Last hospitalized from 9/3-9/5 acute kidney injury (creatinine initially elevated up to 2.88) thought secondary to NSAID use with acute on chronic gout flare.  Fluid analysis from the knee from 9/3 was discussed with ID who thought likely related to gout and less likely septic arthritis as patient had improvement with prednisone and cultures showed no growth.  She had been taking ibuprofen pretty frequently, but since that hospitalization patient reports that she has not used any ibuprofen as she was instructed to stop due to it affecting her kidney.  Her kidney function had been improving and was down to 1.5 on 9/17.  Patient was last seen in the emergency department on 9/17 with complaints of right knee pain and left elbow pain at that time white blood cell count was around 20,000, but thought secondary to recent steroid use.  She was ultimately discharged home, but patient reports that her symptoms have not improved.  Denies noting any significant fever, but she has not been able to get around much due to her symptoms.  ED Course: On admission to the emergency department  patient was noted to be febrile up to 101.5 F, pulse 88-1 11, respirations 16-25, and all other vital signs maintained.  Labs from 9/21-9/22 significant for WBC 23.9, hemoglobin 10.4 platelets 420 sodium 133, BUN 25, creatinine 1.48, alkaline phosphatase 146, albumin 2.6, lactic acid 1.7, i-STAT hCG CRP 33.2, and sedimentation rate 110. COVID-19 and influenza screening was negative.  MRI was significant for inflammation and effusions of the bilateral SI joint concerning for septic arthritis and possible abscess near L5-S1.  Blood cultures had been obtained.  Orthopedics have been formally consulted, but recommended IR drainage.  Patient has been given Ativan 1 mg IV, Dilaudid 1 mg IV, started on lactated Ringer's at 150 mL/h.  MRI of the left elbow is pending.  Review of Systems  Constitutional:  Positive for malaise/fatigue. Negative for fever.  Respiratory:  Negative for shortness of breath.   Cardiovascular:  Negative for chest pain.  Gastrointestinal:  Negative for nausea and vomiting.  Musculoskeletal:  Positive for joint pain.  All other systems reviewed and are negative.  Past Medical History:  Diagnosis Date   Acid reflux 08/2019   Asthma    Depression    Gout    Hypertension    Stroke [facility])     Past Surgical History:  Procedure Laterality Date   LOOP RECORDER INSERTION N/A 08/19/2017   Procedure: LOOP RECORDER INSERTION;  Surgeon: Thompson Grayer, MD;  Location: [facility];  Service: Cardiovascular;  Laterality: N/A;   TEE WITHOUT CARDIOVERSION N/A 08/19/2017   Procedure: TRANSESOPHAGEAL ECHOCARDIOGRAM (TEE);  Surgeon: Fay Records, MD;  Location: [place];  Service: Cardiovascular;  Laterality: N/A;     reports that she has been smoking cigarettes. She has been smoking an average of .25 packs per day. She has never used smokeless tobacco. She reports current alcohol use. She reports that she does not use drugs.  Allergies  Allergen Reactions   Nsaids Other (See  Comments)    Likely causing AKI    Family History  Problem Relation Age of Onset   Heart failure Mother    Hypertension Mother    Stroke Neg Hx     Prior to Admission medications   Medication Sig Start Date End Date Taking? Authorizing Provider  acetaminophen (TYLENOL) 500 MG tablet Take 500 mg by mouth every 6 (six) hours as needed for moderate pain or headache.    [provider]  albuterol (VENTOLIN HFA) 108 (90 Base) MCG/ACT inhaler INHALE 2 PUFFS INTO THE LUNGS EVERY 4 (FOUR) HOURS AS NEEDED FOR WHEEZING OR SHORTNESS OF BREATH. 06/15/20   [name], FNP  allopurinol (ZYLOPRIM) 100 MG tablet Take 1 tablet (100 mg total) by mouth daily. 03/14/21 04/13/21  Darliss Cheney, MD  amLODipine (NORVASC) 10 MG tablet TAKE 1 TABLET BY MOUTH DAILY Patient taking differently: Take 10 mg by mouth daily. 05/05/20 05/05/21  [name], FNP  calcium carbonate (TUMS EX) 750 MG chewable tablet Chew 2 tablets by mouth as needed for heartburn (heartburn).    [provider]  hydrochlorothiazide (HYDRODIURIL) 25 MG tablet Take 1 tablet (25 mg total) by mouth daily. 03/14/21 04/13/21  Darliss Cheney, MD  oxyCODONE-acetaminophen (PERCOCET/ROXICET) 5-325 MG tablet Take 1 tablet by mouth every 8 (eight) hours as needed for severe pain. 03/05/21   Valarie Merino, MD  predniSONE (DELTASONE) 10 MG tablet Take 4 tabs (40 mg) po daily for 4 days, followed by 3 tabs (30 mg) po daily for 4 days, followed by 2 tabs (20 mg) po daily for 4 days, followed by 10 mg po daily for 4 days and then stop 03/12/21   Darliss Cheney, MD    Physical Exam:  Constitutional: Middle-age female who appears to be in some distress Vitals:   03/29/21 0730 03/29/21 0745 03/29/21 0800 03/29/21 0941  BP: 125/85 128/86 125/87 (!) 119/96  Pulse: 92 99 98 99  Resp: (!) 22 20 (!) 22 20  Temp:    98 F (36.7 C)  TempSrc:    Oral  SpO2: 100% 100% 99% 99%  Weight:      Height:       Eyes: PERRL, lids and conjunctivae  normal ENMT: Mucous membranes are moist. Posterior pharynx clear of any exudate or lesions.  Neck: normal, supple, no masses, no thyromegaly Respiratory: clear to auscultation bilaterally, no wheezing, no crackles. Normal respiratory effort. No accessory muscle use.  Cardiovascular: Regular rate and rhythm, no murmurs / rubs / gallops. No extremity edema. 2+ pedal pulses. No carotid bruits.  Abdomen: no tenderness, no masses palpated. No hepatosplenomegaly. Bowel sounds positive.  Musculoskeletal: no clubbing / cyanosis.  Joint swelling noted of the left fourth MCP joint, wrist, elbow, and shoulder with tenderness to palpation.  Decreased range of motion noted of the right hip and left arm. Skin: Mild erythema appreciated around the left fourth digit. Neurologic: CN 2-12 grossly intact. Sensation intact, DTR normal. Strength 5/5 in all 4.  Psychiatric: Normal judgment and insight. Alert and oriented x 3. Normal mood.     Labs on Admission: I have personally reviewed following labs and imaging studies  CBC: Recent Labs  Lab 03/24/21 1213 03/28/21 1426  WBC 20.3* 23.9*  NEUTROABS  --  22.1*  HGB 10.3* 10.4*  HCT 31.4* 32.6*  MCV 76.2* 77.4*  PLT 520* 546*   Basic Metabolic Panel: Recent Labs  Lab 03/24/21 1213 03/28/21 1426  NA 135 133*  K 4.0 3.7  CL 97* 96*  CO2 26 22  GLUCOSE 93 112*  BUN 28* 25*  CREATININE 1.50* 1.48*  CALCIUM 9.5 9.4   GFR: Estimated Creatinine Clearance: 54 mL/min (A) (by C-G formula based on SCr of 1.48 mg/dL (H)). Liver Function Tests: Recent Labs  Lab 03/29/21 0409  AST 15  ALT 32  ALKPHOS 149*  BILITOT 1.1  PROT 8.1  ALBUMIN 2.6*   No results for input(s): LIPASE, AMYLASE in the last 168 hours. No results for input(s): AMMONIA in the last 168 hours. Coagulation Profile: Recent Labs  Lab 03/29/21 0409  INR 1.2   Cardiac Enzymes: No results for input(s): CKTOTAL, CKMB, CKMBINDEX, TROPONINI in the last 168 hours. BNP (last 3  results) No results for input(s): PROBNP in the last 8760 hours. HbA1C: No results for input(s): HGBA1C in the last 72 hours. CBG: No results for input(s): GLUCAP in the last 168 hours. Lipid Profile: No results for input(s): CHOL, HDL, LDLCALC, TRIG, CHOLHDL, LDLDIRECT in the last 72 hours. Thyroid Function Tests: No results for input(s): TSH, T4TOTAL, FREET4, T3FREE, THYROIDAB in the last 72 hours. Anemia Panel: No results for input(s): VITAMINB12, FOLATE, FERRITIN, TIBC, IRON, RETICCTPCT in the last 72 hours. Urine analysis:    Component Value Date/Time   COLORURINE YELLOW 07/17/2017 1629   APPEARANCEUR CLOUDY (A) 07/17/2017 1629   LABSPEC 1.020 07/29/2017 1048   PHURINE 5.5 07/29/2017 1048   GLUCOSEU NEGATIVE 07/29/2017 1048   HGBUR TRACE (A) 07/29/2017 1048   BILIRUBINUR negative 02/15/2019 1113   KETONESUR negative 02/15/2019 1113   KETONESUR NEGATIVE 07/29/2017 1048   PROTEINUR negative 02/15/2019 1113   PROTEINUR NEGATIVE 07/29/2017 1048   UROBILINOGEN 0.2 02/15/2019 1113   UROBILINOGEN 0.2 07/29/2017 1048   NITRITE Negative 02/15/2019 1113   NITRITE NEGATIVE 07/29/2017 1048   LEUKOCYTESUR Small (1+) (A) 02/15/2019 1113   Sepsis Labs: Recent Results (from the past 240 hour(s))  Resp Panel by RT-PCR (Flu A&B, Covid) Nasopharyngeal Swab     Status: None   Collection Time: 03/29/21  4:09 AM   Specimen: Nasopharyngeal Swab; Nasopharyngeal(NP) swabs in vial transport medium  Result Value Ref Range Status   SARS Coronavirus 2 by RT PCR NEGATIVE NEGATIVE Final    Comment: (NOTE) SARS-CoV-2 target nucleic acids are NOT DETECTED.  The SARS-CoV-2 RNA is generally detectable in upper respiratory specimens during the acute phase of infection. The lowest concentration of SARS-CoV-2 viral copies this assay can detect is 138 copies/mL. A negative result does not preclude SARS-Cov-2 infection and should not be used as the sole basis for treatment or other patient management  decisions. A negative result may occur with  improper specimen collection/handling, submission of specimen other than nasopharyngeal swab, presence of viral mutation(s) within the areas targeted by this assay, and inadequate number of viral copies(<138 copies/mL). A negative result must be combined with clinical observations, patient history, and epidemiological information. The expected result is Negative.  Fact Sheet for Patients:  EntrepreneurPulse.com.au  Fact Sheet for Healthcare Providers:  IncredibleEmployment.be  This test is no t yet approved or cleared by the [place] FDA and  has been authorized for detection and/or diagnosis of SARS-CoV-2 by  FDA under an Emergency Use Authorization (EUA). This EUA will remain  in effect (meaning this test can be used) for the duration of the COVID-19 declaration under Section 564(b)(1) of the Act, 21 U.S.C.section 360bbb-3(b)(1), unless the authorization is terminated  or revoked sooner.       Influenza A by PCR NEGATIVE NEGATIVE Final   Influenza B by PCR NEGATIVE NEGATIVE Final    Comment: (NOTE) The Xpert Xpress SARS-CoV-2/FLU/RSV plus assay is intended as an aid in the diagnosis of influenza from Nasopharyngeal swab specimens and should not be used as a sole basis for treatment. Nasal washings and aspirates are unacceptable for Xpert Xpress SARS-CoV-2/FLU/RSV testing.  Fact Sheet for Patients: EntrepreneurPulse.com.au  Fact Sheet for Healthcare Providers: IncredibleEmployment.be  This test is not yet approved or cleared by the [place] FDA and has been authorized for detection and/or diagnosis of SARS-CoV-2 by FDA under an Emergency Use Authorization (EUA). This EUA will remain in effect (meaning this test can be used) for the duration of the COVID-19 declaration under Section 564(b)(1) of the Act, 21 U.S.C. section 360bbb-3(b)(1), unless the  authorization is terminated or revoked.  Performed at [facility], [place] [address], [place], [place] [address]      Radiological Exams on Admission: DG ELBOW COMPLETE LEFT (3+VIEW)  Result Date: 03/28/2021 CLINICAL DATA:  Left elbow pain over the past several weeks without acute injury, initial encounter EXAM: LEFT ELBOW - COMPLETE 3+ VIEW COMPARISON:  None. FINDINGS: No acute fracture or dislocation is noted. Considerable soft tissue swelling is noted over the olecranon in the distal humerus posteriorly. IMPRESSION: Soft tissue swelling without acute bony abnormality. Electronically Signed   By: Inez Catalina M.D.   On: 03/28/2021 18:30   DG Wrist Complete Left  Result Date: 03/28/2021 CLINICAL DATA:  Left wrist pain for several weeks, no known injury, initial encounter EXAM: LEFT WRIST - COMPLETE 3+ VIEW COMPARISON:  None. FINDINGS: There is no evidence of fracture or dislocation. There is no evidence of arthropathy or other focal bone abnormality. Soft tissues are unremarkable. IMPRESSION: No acute abnormality noted. Electronically Signed   By: Inez Catalina M.D.   On: 03/28/2021 18:27   MR HIP RIGHT WO CONTRAST  Result Date: 03/29/2021 CLINICAL DATA:  Right hip pain elbow pain and wrist pain and swelling. EXAM: MR OF THE RIGHT HIP WITHOUT CONTRAST TECHNIQUE: Multiplanar, multisequence MR imaging was performed. No intravenous contrast was administered. COMPARISON:  None. FINDINGS: Examination is quite limited and patient would not complete the exam. There are bilateral SI joint effusions. There is also some surrounding inflammation/edema worrisome for septic arthritis. I do not see any significant or obvious bony changes of osteomyelitis. Small bilateral hip joint effusions are also noted. The pubic symphysis is unremarkable. Inflammation/edema is noted in the lower paravertebral muscles and there is a fluid collection associated with the right L5-S1 facet joint. Could not exclude septic  arthritis and focal abscess. Lumbar spine MRI without and with contrast may be helpful for further evaluation. I reviewed the elbow films from 03/28/2021 and the patient definitely has a large elbow joint effusion and I would be worried about septic arthritis at this joint also. MR imaging is suggested if able. IMPRESSION: 1. Bilateral SI joint effusions and surrounding inflammation/edema worrisome for septic arthritis. No obvious bony changes of osteomyelitis. 2. Small bilateral hip joint effusions. 3. Findings worrisome for septic arthritis and possible adjacent abscess associated with the right L5-S1 facet joint. 4. Reviewing the patient's left  elbow radiographs, there is a large joint effusion and I would be worried about septic arthritis at this joint also. Electronically Signed   By: Marijo Sanes M.D.   On: 03/29/2021 09:30   DG Chest Port 1 View  Result Date: 03/29/2021 CLINICAL DATA:  Questionable sepsis EXAM: PORTABLE CHEST 1 VIEW COMPARISON:  03/11/2021 FINDINGS: Loop recorder overlies the left heart border. The heart size and mediastinal contours are within normal limits. No focal pulmonary opacity. No pleural effusion or pneumothorax. The visualized skeletal structures are unremarkable. IMPRESSION: No active disease. Electronically Signed   By: Merilyn Baba M.D.   On: 03/29/2021 03:53   DG Hip Unilat W or Wo Pelvis 2-3 Views Right  Result Date: 03/28/2021 CLINICAL DATA:  Right hip pain. EXAM: DG HIP (WITH OR WITHOUT PELVIS) 2-3V RIGHT COMPARISON:  None. FINDINGS: There is no acute fracture or dislocation. The bones are well mineralized. No significant arthritic changes. The soft tissues are unremarkable. IMPRESSION: Negative. Electronically Signed   By: Anner Crete M.D.   On: 03/28/2021 19:52   Cysts EKG: Independently reviewed.  Sinus tachycardia 101 bpm  Assessment/Plan  Sepsis secondary to suspected septic arthritis vs. polyarticular gout: Acute.  Patient presents with complaints  of multiple joint pain and swelling.  Found to be febrile up to 101.2 F with tachycardia, white blood cell count elevated up to 23.9, CRP 33.2, ESR 110, and uric acid was 9.5.  Lactic acid was reassuring at 1.7.  MRI imaging significant for concern for septic arthritis of the SI joints of the hip with possible abscess. Patient has been started on IVFs fluids, but antibiotics had not initially been started in attempts to try and obtain cultures.  Orthopedics had formally seen the patient and given recommendations of IR aspiration to send for C&S and cell count. -Admit to medical telemetry bed -Follow-up blood and IR aspirate cultures -Check GC and chlamydia -Oxycodone/hydromorphone as needed moderate to severe pain respectively -Colchicine 0.6 mg twice daily -Prednisone 60 mg daily -Holding off antibiotics until able to obtain aspirate -Appreciate ID consultative services,  will follow-up for any further recommendation -Appreciate IR consultative services for IR drainage and culture -Recheck CBC with differential in a.m. -Consider need of PT/OT evaluation in a.m. -Would benefit from being referred to rheumatology in outpatient setting  Chronic kidney disease stage IIIa: Patient presents with creatinine of 1.48 with BUN 25.  Hospitalization earlier this month creatinine had trended up to 2.88, but have been trending back down.  Thought to be secondary to NSAID induced kidney injury, but she denies any continued use ibuprofen.  On the differential includes possibility of still resolving acute kidney injury. -Monitor intake and output -Normal saline IV fluids 100 mL/h -Avoid nephrotoxic agents  Microcytic hypochromic anemia: Hemoglobin appears relatively stable at 10.4 g/dL which appears patient's baseline.  Patient is likely iron deficient with low MCV and MCH given she still has menstrual periods. -Check iron studies in a.m.  Essential hypertension: On admission blood pressures noted to be  relatively stable.  Home blood pressure medications include amlodipine 10 mg daily and hydrochlorothiazide -Continue amlodipine  Tobacco abuse -Continue to counsel on need of cessation of tobacco -Patient declined need of nicotine patch  DVT prophylaxis: lovenox Code Status: Full Family Communication: None requested Disposition Plan: Hopefully once medically stable Consults called: Orthopedics, IR Admission status: Inpatient, require more than 2 midnight stay for symptoms  Norval Morton MD [facility]   If 7PM-7AM, please contact night-coverage   03/29/2021,  10:17 AM

## 2021-03-29 NOTE — ED Provider Notes (Signed)
[facility] EMERGENCY DEPARTMENT Provider Note   CSN: [id number] Arrival date & time: 03/28/21  1307     History Chief Complaint  Patient presents with   Joint Pain   Fever    Angeligue Groll is a 40 y.o. female with a complicated medical history including stroke in 2019 and chronic gout presents to the emergency department with worsening right-sided hip pain and left elbow and wrist pain and swelling.  Patient denies known trauma, falls.  Patient reports she was recently seen for gout and put on prednisone.  Reports she still has 4 days left.  In the meantime she developed fever and inability to walk or range her right hip.  Denies a history of septic joint, IV drug use.  Patient reports severe pain.  Movement and palpation make the symptoms worse.  Nothing makes it better.  Records indicate insertion of loop recorder after embolic stroke in 2019.  She still has this in place.  Febrile on arrival  The history is provided by the patient, a parent and medical records. No language interpreter was used.      Past Medical History:  Diagnosis Date   Acid reflux 08/2019   Asthma    Depression    Gout    Hypertension    Stroke [facility])     Patient Active Problem List   Diagnosis Date Noted   Acute gout of knee 03/11/2021   AKI (acute kidney injury) (HCC) 03/11/2021   Adverse reaction to non-steroidal anti-inflammatory drug (NSAID), initial encounter 03/11/2021   Vitamin D deficiency 11/17/2019   Chronic gout of foot 10/11/2018   Current smoker 10/11/2018   Generalized pain 10/11/2018   Chronic nonintractable headache 08/06/2017   Essential hypertension 08/06/2017   Stroke (HCC) 07/17/2017    Past Surgical History:  Procedure Laterality Date   LOOP RECORDER INSERTION N/A 08/19/2017   Procedure: LOOP RECORDER INSERTION;  Surgeon: Hillis Range, MD;  Location: [facility] INVASIVE CV LAB;  Service: Cardiovascular;  Laterality: N/A;   TEE WITHOUT CARDIOVERSION N/A  08/19/2017   Procedure: TRANSESOPHAGEAL ECHOCARDIOGRAM (TEE);  Surgeon: Pricilla Riffle, MD;  Location: [facility] ENDOSCOPY;  Service: Cardiovascular;  Laterality: N/A;     OB History     Gravida  0   Para  0   Term  0   Preterm  0   AB  0   Living  0      SAB  0   IAB  0   Ectopic  0   Multiple  0   Live Births  0           Family History  Problem Relation Age of Onset   Heart failure Mother    Hypertension Mother    Stroke Neg Hx     Social History   Tobacco Use   Smoking status: Every Day    Packs/day: 0.25    Types: Cigarettes   Smokeless tobacco: Never  Vaping Use   Vaping Use: Never used  Substance Use Topics   Alcohol use: Yes    Comment: rare   Drug use: No    Home Medications Prior to Admission medications   Medication Sig Start Date End Date Taking? Authorizing Provider  acetaminophen (TYLENOL) 500 MG tablet Take 500 mg by mouth every 6 (six) hours as needed for moderate pain or headache.    [provider]  albuterol (VENTOLIN HFA) 108 (90 Base) MCG/ACT inhaler INHALE 2 PUFFS INTO THE LUNGS EVERY 4 (FOUR)  HOURS AS NEEDED FOR WHEEZING OR SHORTNESS OF BREATH. 06/15/20   Kallie Locks, FNP  allopurinol (ZYLOPRIM) 100 MG tablet Take 1 tablet (100 mg total) by mouth daily. 03/14/21 04/13/21  Hughie Closs, MD  amLODipine (NORVASC) 10 MG tablet TAKE 1 TABLET BY MOUTH DAILY Patient taking differently: Take 10 mg by mouth daily. 05/05/20 05/05/21  Kallie Locks, FNP  calcium carbonate (TUMS EX) 750 MG chewable tablet Chew 2 tablets by mouth as needed for heartburn (heartburn).    [provider]  hydrochlorothiazide (HYDRODIURIL) 25 MG tablet Take 1 tablet (25 mg total) by mouth daily. 03/14/21 04/13/21  Hughie Closs, MD  oxyCODONE-acetaminophen (PERCOCET/ROXICET) 5-325 MG tablet Take 1 tablet by mouth every 8 (eight) hours as needed for severe pain. 03/05/21   Wynetta Fines, MD  predniSONE (DELTASONE) 10 MG tablet Take 4 tabs (40 mg)  po daily for 4 days, followed by 3 tabs (30 mg) po daily for 4 days, followed by 2 tabs (20 mg) po daily for 4 days, followed by 10 mg po daily for 4 days and then stop 03/12/21   Hughie Closs, MD    Allergies    Nsaids  Review of Systems   Review of Systems  Constitutional:  Positive for fever. Negative for appetite change, diaphoresis, fatigue and unexpected weight change.  HENT:  Negative for mouth sores.   Eyes:  Negative for visual disturbance.  Respiratory:  Negative for cough, chest tightness, shortness of breath and wheezing.   Cardiovascular:  Negative for chest pain.  Gastrointestinal:  Negative for abdominal pain, constipation, diarrhea, nausea and vomiting.  Endocrine: Negative for polydipsia, polyphagia and polyuria.  Genitourinary:  Negative for dysuria, frequency, hematuria and urgency.  Musculoskeletal:  Positive for arthralgias, back pain, gait problem, joint swelling and myalgias. Negative for neck stiffness.  Skin:  Negative for rash.  Allergic/Immunologic: Negative for immunocompromised state.  Neurological:  Negative for syncope, light-headedness and headaches.  Hematological:  Does not bruise/bleed easily.  Psychiatric/Behavioral:  Negative for sleep disturbance. The patient is not nervous/anxious.    Physical Exam Updated Vital Signs BP 126/80   Pulse 88   Temp 98.7 F (37.1 C)   Resp 17   Ht 5\' 4"  (1.626 m)   Wt 90.7 kg   SpO2 98%   BMI 34.33 kg/m   Physical Exam Vitals and nursing note reviewed.  Constitutional:      General: She is not in acute distress.    Appearance: She is not diaphoretic.  HENT:     Head: Normocephalic.  Eyes:     General: No scleral icterus.    Conjunctiva/sclera: Conjunctivae normal.  Cardiovascular:     Rate and Rhythm: Normal rate and regular rhythm.     Pulses: Normal pulses.          Radial pulses are 2+ on the right side and 2+ on the left side.  Pulmonary:     Effort: No tachypnea, accessory muscle usage,  prolonged expiration, respiratory distress or retractions.     Breath sounds: No stridor.     Comments: Equal chest rise. No increased work of breathing. Abdominal:     General: There is no distension.     Palpations: Abdomen is soft.     Tenderness: There is no abdominal tenderness. There is no guarding or rebound.  Musculoskeletal:     Cervical back: Normal range of motion.     Comments: Moves all extremities equally and without difficulty.  Skin:  General: Skin is warm and dry.     Capillary Refill: Capillary refill takes less than 2 seconds.  Neurological:     Mental Status: She is alert.     GCS: GCS eye subscore is 4. GCS verbal subscore is 5. GCS motor subscore is 6.     Comments: Speech is clear and goal oriented.  Psychiatric:        Mood and Affect: Mood normal.    ED Results / Procedures / Treatments   Labs (all labs ordered are listed, but only abnormal results are displayed) Labs Reviewed  CBC WITH DIFFERENTIAL/PLATELET - Abnormal; Notable for the following components:      Result Value   WBC 23.9 (*)    Hemoglobin 10.4 (*)    HCT 32.6 (*)    MCV 77.4 (*)    MCH 24.7 (*)    Platelets 420 (*)    Neutro Abs 22.1 (*)    Lymphs Abs 0.4 (*)    Monocytes Absolute 1.2 (*)    Abs Immature Granulocytes 0.23 (*)    All other components within normal limits  BASIC METABOLIC PANEL - Abnormal; Notable for the following components:   Sodium 133 (*)    Chloride 96 (*)    Glucose, Bld 112 (*)    BUN 25 (*)    Creatinine, Ser 1.48 (*)    GFR, Estimated 45 (*)    All other components within normal limits  SEDIMENTATION RATE - Abnormal; Notable for the following components:   Sed Rate 110 (*)    All other components within normal limits  C-REACTIVE PROTEIN - Abnormal; Notable for the following components:   CRP 33.2 (*)    All other components within normal limits  HEPATIC FUNCTION PANEL - Abnormal; Notable for the following components:   Albumin 2.6 (*)    Alkaline  Phosphatase 149 (*)    Bilirubin, Direct 0.3 (*)    All other components within normal limits  I-STAT BETA HCG BLOOD, ED ([facility], [facility], [facility] ONLY) - Abnormal; Notable for the following components:   I-stat hCG, quantitative 18.9 (*)    All other components within normal limits  RESP PANEL BY RT-PCR (FLU A&B, COVID) ARPGX2  CULTURE, BLOOD (ROUTINE X 2)  CULTURE, BLOOD (ROUTINE X 2)  HCG, QUANTITATIVE, PREGNANCY  LACTIC ACID, PLASMA  PROTIME-INR  APTT  LACTIC ACID, PLASMA  URINALYSIS, ROUTINE W REFLEX MICROSCOPIC  I-STAT BETA HCG BLOOD, ED ([facility], [facility], [facility] ONLY)    Radiology DG ELBOW COMPLETE LEFT (3+VIEW)  Result Date: 03/28/2021 CLINICAL DATA:  Left elbow pain over the past several weeks without acute injury, initial encounter EXAM: LEFT ELBOW - COMPLETE 3+ VIEW COMPARISON:  None. FINDINGS: No acute fracture or dislocation is noted. Considerable soft tissue swelling is noted over the olecranon in the distal humerus posteriorly. IMPRESSION: Soft tissue swelling without acute bony abnormality. Electronically Signed   By: Alcide Clever M.D.   On: 03/28/2021 18:30   DG Wrist Complete Left  Result Date: 03/28/2021 CLINICAL DATA:  Left wrist pain for several weeks, no known injury, initial encounter EXAM: LEFT WRIST - COMPLETE 3+ VIEW COMPARISON:  None. FINDINGS: There is no evidence of fracture or dislocation. There is no evidence of arthropathy or other focal bone abnormality. Soft tissues are unremarkable. IMPRESSION: No acute abnormality noted. Electronically Signed   By: Alcide Clever M.D.   On: 03/28/2021 18:27   DG Chest Port 1 View  Result Date: 03/29/2021 CLINICAL DATA:  Questionable sepsis  EXAM: PORTABLE CHEST 1 VIEW COMPARISON:  03/11/2021 FINDINGS: Loop recorder overlies the left heart border. The heart size and mediastinal contours are within normal limits. No focal pulmonary opacity. No pleural effusion or pneumothorax. The visualized skeletal structures are unremarkable. IMPRESSION: No active  disease. Electronically Signed   By: Wiliam Ke M.D.   On: 03/29/2021 03:53   DG Hip Unilat W or Wo Pelvis 2-3 Views Right  Result Date: 03/28/2021 CLINICAL DATA:  Right hip pain. EXAM: DG HIP (WITH OR WITHOUT PELVIS) 2-3V RIGHT COMPARISON:  None. FINDINGS: There is no acute fracture or dislocation. The bones are well mineralized. No significant arthritic changes. The soft tissues are unremarkable. IMPRESSION: Negative. Electronically Signed   By: Elgie Collard M.D.   On: 03/28/2021 19:52    Procedures Procedures   Medications Ordered in ED Medications  lactated ringers infusion ( Intravenous New Bag/Given 03/29/21 0415)  LORazepam (ATIVAN) injection 1 mg (has no administration in time range)  acetaminophen (TYLENOL) tablet 650 mg (650 mg Oral Given 03/28/21 1423)  HYDROmorphone (DILAUDID) injection 1 mg (1 mg Intravenous Given 03/29/21 0408)    ED Course  I have reviewed the triage vital signs and the nursing notes.  Pertinent labs & imaging results that were available during my care of the patient were reviewed by me and considered in my medical decision making (see chart for details).    MDM Rules/Calculators/A&P                           Presents to the emergency department with significant wrist, elbow and hip pain.  She is febrile on arrival.  Minimal movement of the right hip and left elbow.  Concern for possible septic joint given fever, elevated white blood cell count.  Patient has been on recent steroids which may account for her leukocytosis.  Reports COVID 2 weeks ago however has been afebrile and without symptoms into the last 2 days.  Denies recent falls or injuries.  Patient pending MRI of right hip and left elbow.  Will need orthopedic evaluation and admission.  6:42 AM Care transferred to the oncoming team who will follow imaging and admit patient.  Final Clinical Impression(s) / ED Diagnoses Final diagnoses:  Polyarthralgia  Fever, unspecified fever cause     Rx / DC Orders ED Discharge Orders     None        Edris Schneck, Boyd Kerbs 03/29/21 [id number]    Tilden Fossa, MD 03/29/21 2023    Tilden Fossa, MD 03/29/21 2027

## 2021-03-29 NOTE — Consult Note (Addendum)
Reason for Consult:Polyarthralgia Referring Physician: Melene Plan Time called: 1610 Time at bedside: 0946   Suzanne Graham is an 40 y.o. female.  HPI: Suzanne Graham comes to the ED with c/o multiple joint pain that has been present for over a month. The pain is constant but does wax and wane. She thinks it began in her left leg and ED notes from 8/29 confirm that. She was placed on prednisone at the time at 40mg /day. She notes that she has been taking that ever since and is down to 20mg /day on the taper. Shortly after that she began to develop right knee pain and returned to the ED. Tap was c/w gout. She was admitted with AKI. Pain improved while in hospital and she was discharged. She returned about 10d later (6d ago) with continued right knee pain and some new shoulder pain. Then returns today with continuation of pain. She denies travel, new sexual contacts, or vaginitis. She does admit to a bout of pharyngitis last week but it only lasted 2d and resolved spontaneously.  Past Medical History:  Diagnosis Date   Acid reflux 08/2019   Asthma    Depression    Gout    Hypertension    Stroke [facility])     Past Surgical History:  Procedure Laterality Date   LOOP RECORDER INSERTION N/A 08/19/2017   Procedure: LOOP RECORDER INSERTION;  Surgeon: [facility], MD;  Location: [facility] INVASIVE CV LAB;  Service: Cardiovascular;  Laterality: N/A;   TEE WITHOUT CARDIOVERSION N/A 08/19/2017   Procedure: TRANSESOPHAGEAL ECHOCARDIOGRAM (TEE);  Surgeon: Hillis Range, MD;  Location: [facility] ENDOSCOPY;  Service: Cardiovascular;  Laterality: N/A;    Family History  Problem Relation Age of Onset   Heart failure Mother    Hypertension Mother    Stroke Neg Hx     Social History:  reports that she has been smoking cigarettes. She has been smoking an average of .25 packs per day. She has never used smokeless tobacco. She reports current alcohol use. She reports that she does not use drugs.  Allergies:  Allergies  Allergen  Reactions   Nsaids Other (See Comments)    Likely causing AKI    Medications: I have reviewed the patient's current medications.  Results for orders placed or performed during the hospital encounter of 03/28/21 (from the past 48 hour(s))  CBC with Differential     Status: Abnormal   Collection Time: 03/28/21  2:26 PM  Result Value Ref Range   WBC 23.9 (H) 4.0 - 10.5 K/uL   RBC 4.21 3.87 - 5.11 MIL/uL   Hemoglobin 10.4 (L) 12.0 - 15.0 g/dL   HCT 03/30/21 (L) 03/30/21 - 96.0 %   MCV 77.4 (L) 80.0 - 100.0 fL   MCH 24.7 (L) 26.0 - 34.0 pg   MCHC 31.9 30.0 - 36.0 g/dL   RDW 45.4 09.8 - 11.9 %   Platelets 420 (H) 150 - 400 K/uL   nRBC 0.0 0.0 - 0.2 %   Neutrophils Relative % 92 %   Neutro Abs 22.1 (H) 1.7 - 7.7 K/uL   Lymphocytes Relative 2 %   Lymphs Abs 0.4 (L) 0.7 - 4.0 K/uL   Monocytes Relative 5 %   Monocytes Absolute 1.2 (H) 0.1 - 1.0 K/uL   Eosinophils Relative 0 %   Eosinophils Absolute 0.0 0.0 - 0.5 K/uL   Basophils Relative 0 %   Basophils Absolute 0.0 0.0 - 0.1 K/uL   Immature Granulocytes 1 %   Abs Immature Granulocytes 0.23 (  H) 0.00 - 0.07 K/uL    Comment: Performed at [facility], [address], [place], [place] [address]  Basic metabolic panel     Status: Abnormal   Collection Time: 03/28/21  2:26 PM  Result Value Ref Range   Sodium 133 (L) 135 - 145 mmol/L   Potassium 3.7 3.5 - 5.1 mmol/L   Chloride 96 (L) 98 - 111 mmol/L   CO2 22 22 - 32 mmol/L   Glucose, Bld 112 (H) 70 - 99 mg/dL    Comment: Glucose reference range applies only to samples taken after fasting for at least 8 hours.   BUN 25 (H) 6 - 20 mg/dL   Creatinine, Ser 6.04 (H) 0.44 - 1.00 mg/dL   Calcium 9.4 8.9 - 54.0 mg/dL   GFR, Estimated 45 (L) >60 mL/min    Comment: (NOTE) Calculated using the CKD-EPI Creatinine Equation (2021)    Anion gap 15 5 - 15    Comment: Performed at [facility], [address] [place], [place] [address]  Sedimentation rate     Status: Abnormal   Collection  Time: 03/28/21  2:26 PM  Result Value Ref Range   Sed Rate 110 (H) 0 - 22 mm/hr    Comment: Performed at [facility], [address], [place], [place] [address]  C-reactive protein     Status: Abnormal   Collection Time: 03/28/21  2:26 PM  Result Value Ref Range   CRP 33.2 (H) <1.0 mg/dL    Comment: Performed at [facility], [address], [place], [place] [address]  hCG, quantitative, pregnancy     Status: None   Collection Time: 03/28/21  2:26 PM  Result Value Ref Range   hCG, Beta Chain, Quant, S 1 <5 mIU/mL    Comment:          GEST. AGE      CONC.  (mIU/mL)   <=1 WEEK        5 - 50     2 WEEKS       50 - 500     3 WEEKS       100 - 10,000     4 WEEKS     1,000 - 30,000     5 WEEKS     3,500 - 115,000   6-8 WEEKS     12,000 - 270,000    12 WEEKS     15,000 - 220,000        FEMALE AND NON-PREGNANT FEMALE:     LESS THAN 5 mIU/mL Performed at [facility], [address] [place], [place] [address]   Resp Panel by RT-PCR (Flu A&B, Covid) Nasopharyngeal Swab     Status: None   Collection Time: 03/29/21  4:09 AM   Specimen: Nasopharyngeal Swab; Nasopharyngeal(NP) swabs in vial transport medium  Result Value Ref Range   SARS Coronavirus 2 by RT PCR NEGATIVE NEGATIVE    Comment: (NOTE) SARS-CoV-2 target nucleic acids are NOT DETECTED.  The SARS-CoV-2 RNA is generally detectable in upper respiratory specimens during the acute phase of infection. The lowest concentration of SARS-CoV-2 viral copies this assay can detect is 138 copies/mL. A negative result does not preclude SARS-Cov-2 infection and should not be used as the sole basis for treatment or other patient management decisions. A negative result may occur with  improper specimen collection/handling, submission of specimen other than nasopharyngeal swab, presence of viral mutation(s) within the areas  targeted by this assay, and inadequate number of viral copies(<138 copies/mL). A negative result must be  combined with clinical observations, patient history, and epidemiological information. The expected result is Negative.  Fact Sheet for Patients:  BloggerCourse.com  Fact Sheet for Healthcare Providers:  SeriousBroker.it  This test is no t yet approved or cleared by the [place] FDA and  has been authorized for detection and/or diagnosis of SARS-CoV-2 by FDA under an Emergency Use Authorization (EUA). This EUA will remain  in effect (meaning this test can be used) for the duration of the COVID-19 declaration under Section 564(b)(1) of the Act, 21 U.S.C.section 360bbb-3(b)(1), unless the authorization is terminated  or revoked sooner.       Influenza A by PCR NEGATIVE NEGATIVE   Influenza B by PCR NEGATIVE NEGATIVE    Comment: (NOTE) The Xpert Xpress SARS-CoV-2/FLU/RSV plus assay is intended as an aid in the diagnosis of influenza from Nasopharyngeal swab specimens and should not be used as a sole basis for treatment. Nasal washings and aspirates are unacceptable for Xpert Xpress SARS-CoV-2/FLU/RSV testing.  Fact Sheet for Patients: BloggerCourse.com  Fact Sheet for Healthcare Providers: SeriousBroker.it  This test is not yet approved or cleared by the [place] FDA and has been authorized for detection and/or diagnosis of SARS-CoV-2 by FDA under an Emergency Use Authorization (EUA). This EUA will remain in effect (meaning this test can be used) for the duration of the COVID-19 declaration under Section 564(b)(1) of the Act, 21 U.S.C. section 360bbb-3(b)(1), unless the authorization is terminated or revoked.  Performed at [facility], [address], [place], [place] [address]   Lactic acid, plasma     Status: None   Collection Time: 03/29/21  4:09 AM  Result Value Ref Range   Lactic Acid, Venous 1.7 0.5 - 1.9 mmol/L    Comment: Performed at [facility], [address], [place], [place] [address]  Protime-INR     Status: None   Collection Time: 03/29/21  4:09 AM  Result Value Ref Range   Prothrombin Time 15.0 11.4 - 15.2 seconds   INR 1.2 0.8 - 1.2    Comment: (NOTE) INR goal varies based on device and disease states. Performed at [facility], [address], [place], [place] [address]   APTT     Status: None   Collection Time: 03/29/21  4:09 AM  Result Value Ref Range   aPTT 29 24 - 36 seconds    Comment: Performed at [facility], [address], [place], [place] [address]  Hepatic function panel     Status: Abnormal   Collection Time: 03/29/21  4:09 AM  Result Value Ref Range   Total Protein 8.1 6.5 - 8.1 g/dL   Albumin 2.6 (L) 3.5 - 5.0 g/dL   AST 15 15 - 41 U/L   ALT 32 0 - 44 U/L   Alkaline Phosphatase 149 (H) 38 - 126 U/L   Total Bilirubin 1.1 0.3 - 1.2 mg/dL   Bilirubin, Direct 0.3 (H) 0.0 - 0.2 mg/dL   Indirect Bilirubin 0.8 0.3 - 0.9 mg/dL    Comment: Performed at [facility], [address], [place], [place] [address]  I-Stat Beta hCG blood, ED ([facility], [facility], [facility] only)     Status: Abnormal   Collection Time: 03/29/21  5:35 AM  Result Value Ref Range   I-stat hCG, quantitative 18.9 (H) <5 mIU/mL   Comment 3  Comment:   GEST. AGE      CONC.  (mIU/mL)   <=1 WEEK        5 - 50     2 WEEKS       50 - 500     3 WEEKS       100 - 10,000     4 WEEKS     1,000 - 30,000        FEMALE AND NON-PREGNANT FEMALE:     LESS THAN 5 mIU/mL     DG ELBOW COMPLETE LEFT (3+VIEW)  Result Date: 03/28/2021 CLINICAL DATA:  Left elbow pain over the past several weeks without acute injury, initial encounter EXAM: LEFT ELBOW - COMPLETE 3+ VIEW COMPARISON:  None. FINDINGS: No acute fracture or dislocation is noted. Considerable soft tissue swelling is noted over the olecranon in the distal humerus posteriorly. IMPRESSION: Soft tissue swelling without acute bony abnormality. Electronically Signed   By: Alcide Clever M.D.   On: 03/28/2021 18:30   DG Wrist Complete Left  Result Date: 03/28/2021 CLINICAL DATA:  Left wrist pain for several weeks, no known injury, initial encounter EXAM: LEFT WRIST - COMPLETE 3+ VIEW COMPARISON:  None. FINDINGS: There is no evidence of fracture or dislocation. There is no evidence of arthropathy or other focal bone abnormality. Soft tissues are unremarkable. IMPRESSION: No acute abnormality noted. Electronically Signed   By: Alcide Clever M.D.   On: 03/28/2021 18:27   MR HIP RIGHT WO CONTRAST  Result Date: 03/29/2021 CLINICAL DATA:  Right hip pain elbow pain and wrist pain and swelling. EXAM: MR OF THE RIGHT HIP WITHOUT CONTRAST TECHNIQUE: Multiplanar, multisequence MR imaging was performed. No intravenous contrast was administered. COMPARISON:  None. FINDINGS: Examination is quite limited and patient would not complete the exam. There are bilateral SI joint effusions. There is also some surrounding inflammation/edema worrisome for septic arthritis. I do not see any significant or obvious bony changes of osteomyelitis. Small bilateral hip joint effusions are also noted. The pubic symphysis is unremarkable. Inflammation/edema is noted in the lower paravertebral muscles and there is a fluid collection associated with the right L5-S1 facet joint. Could not exclude septic arthritis and focal abscess. Lumbar spine MRI without and with contrast may be helpful for further evaluation. I reviewed the elbow films from 03/28/2021 and the patient definitely has a large elbow joint effusion and I would be worried about septic arthritis at this joint also. MR imaging is suggested if able. IMPRESSION: 1. Bilateral SI joint effusions and surrounding inflammation/edema worrisome for septic arthritis. No obvious bony changes of osteomyelitis. 2. Small bilateral hip joint effusions. 3. Findings worrisome for septic arthritis and possible adjacent abscess associated with the right L5-S1 facet joint. 4.  Reviewing the patient's left elbow radiographs, there is a large joint effusion and I would be worried about septic arthritis at this joint also. Electronically Signed   By: Rudie Meyer M.D.   On: 03/29/2021 09:30   DG Chest Port 1 View  Result Date: 03/29/2021 CLINICAL DATA:  Questionable sepsis EXAM: PORTABLE CHEST 1 VIEW COMPARISON:  03/11/2021 FINDINGS: Loop recorder overlies the left heart border. The heart size and mediastinal contours are within normal limits. No focal pulmonary opacity. No pleural effusion or pneumothorax. The visualized skeletal structures are unremarkable. IMPRESSION: No active disease. Electronically Signed   By: Wiliam Ke M.D.   On: 03/29/2021 03:53   DG Hip Unilat W or Wo Pelvis 2-3 Views Right  Result Date: 03/28/2021  CLINICAL DATA:  Right hip pain. EXAM: DG HIP (WITH OR WITHOUT PELVIS) 2-3V RIGHT COMPARISON:  None. FINDINGS: There is no acute fracture or dislocation. The bones are well mineralized. No significant arthritic changes. The soft tissues are unremarkable. IMPRESSION: Negative. Electronically Signed   By: Elgie Collard M.D.   On: 03/28/2021 19:52    Review of Systems  Constitutional:  Negative for chills, diaphoresis and fever.  HENT:  Negative for ear discharge, ear pain, hearing loss and tinnitus.   Eyes:  Negative for photophobia and pain.  Respiratory:  Negative for cough and shortness of breath.   Cardiovascular:  Negative for chest pain.  Gastrointestinal:  Negative for abdominal pain, nausea and vomiting.  Genitourinary:  Negative for dysuria, flank pain, frequency and urgency.  Musculoskeletal:  Positive for arthralgias (Left shoulder, left elbow to fingertips, right knee, right posterior hip, bilateral ankles). Negative for back pain, myalgias and neck pain.  Neurological:  Negative for dizziness and headaches.  Hematological:  Does not bruise/bleed easily.  Psychiatric/Behavioral:  The patient is not nervous/anxious.   Blood pressure  (!) 119/96, pulse 99, temperature 98 F (36.7 C), temperature source Oral, resp. rate 20, height 5\' 4"  (1.626 m), weight 90.7 kg, SpO2 99 %. Physical Exam Constitutional:      General: She is not in acute distress.    Appearance: She is well-developed. She is not diaphoretic.  HENT:     Head: Normocephalic and atraumatic.  Eyes:     General: No scleral icterus.       Right eye: No discharge.        Left eye: No discharge.     Conjunctiva/sclera: Conjunctivae normal.  Cardiovascular:     Rate and Rhythm: Normal rate and regular rhythm.  Pulmonary:     Effort: Pulmonary effort is normal. No respiratory distress.  Musculoskeletal:     Cervical back: Normal range of motion.     Comments: Left shoulder, elbow, wrist, digits- no skin wounds, mod diffuse TTP, able to AROM elbow nearly 90 degrees, PROM about 110 degrees, no instability, no blocks to motion  Sens  Ax/R/M/U intact  Mot   Ax/ R/ PIN/ M/ AIN/ U grossly intact but severely limited 2/2 pain  Rad 2+  RLE No traumatic wounds, ecchymosis, or rash  Minimal TTP, minimal pain with ext/int hip rotation, sig pain with hip flexion, PROM limited to about 10 degrees  No knee or ankle effusion  Knee stable to varus/ valgus and anterior/posterior stress  Sens DPN, SPN, TN intact  Motor EHL, ext, flex, evers 5/5  DP 1+, PT 1+, No significant edema  Skin:    General: Skin is warm and dry.  Neurological:     Mental Status: She is alert.  Psychiatric:        Mood and Affect: Mood normal.        Behavior: Behavior normal.    Assessment/Plan: Polyarthralgia -- Differential is broad here and includes gout flare, reactive arthritis, disseminated septic arthritis, gonococcal arthritis, and RA. Given her tolerance of int/ext rotation of hip and symmetric small effusions doubt hip joint arthritis present and would be more inclined to blame SI inflammation. Motion of elbow would argue against septic arthritis. Will ask IR to aspirate elbow and  send for C&S and cell count. Would recommend ID consult, would favor restarting prednisone at 40-60mg  daily and renal dose colchicine. Agree with ED assessment of need for admission.    , PA-C Orthopedic Surgery [phone] 03/29/2021,  9:54 AM

## 2021-03-30 ENCOUNTER — Inpatient Hospital Stay (HOSPITAL_COMMUNITY): Payer: Medicaid Other

## 2021-03-30 LAB — CBC WITH DIFFERENTIAL/PLATELET
Abs Immature Granulocytes: 0.25 10*3/uL — ABNORMAL HIGH (ref 0.00–0.07)
Basophils Absolute: 0 10*3/uL (ref 0.0–0.1)
Basophils Relative: 0 %
Eosinophils Absolute: 0 10*3/uL (ref 0.0–0.5)
Eosinophils Relative: 0 %
HCT: 25.9 % — ABNORMAL LOW (ref 36.0–46.0)
Hemoglobin: 8.5 g/dL — ABNORMAL LOW (ref 12.0–15.0)
Immature Granulocytes: 1 %
Lymphocytes Relative: 2 %
Lymphs Abs: 0.3 10*3/uL — ABNORMAL LOW (ref 0.7–4.0)
MCH: 24.7 pg — ABNORMAL LOW (ref 26.0–34.0)
MCHC: 32.8 g/dL (ref 30.0–36.0)
MCV: 75.3 fL — ABNORMAL LOW (ref 80.0–100.0)
Monocytes Absolute: 0.6 10*3/uL (ref 0.1–1.0)
Monocytes Relative: 3 %
Neutro Abs: 19.2 10*3/uL — ABNORMAL HIGH (ref 1.7–7.7)
Neutrophils Relative %: 94 %
Platelets: 349 10*3/uL (ref 150–400)
RBC: 3.44 MIL/uL — ABNORMAL LOW (ref 3.87–5.11)
RDW: 14.4 % (ref 11.5–15.5)
WBC: 20.4 10*3/uL — ABNORMAL HIGH (ref 4.0–10.5)
nRBC: 0 % (ref 0.0–0.2)

## 2021-03-30 LAB — COMPREHENSIVE METABOLIC PANEL
ALT: 26 U/L (ref 0–44)
AST: 19 U/L (ref 15–41)
Albumin: 2 g/dL — ABNORMAL LOW (ref 3.5–5.0)
Alkaline Phosphatase: 137 U/L — ABNORMAL HIGH (ref 38–126)
Anion gap: 12 (ref 5–15)
BUN: 33 mg/dL — ABNORMAL HIGH (ref 6–20)
CO2: 23 mmol/L (ref 22–32)
Calcium: 8.6 mg/dL — ABNORMAL LOW (ref 8.9–10.3)
Chloride: 101 mmol/L (ref 98–111)
Creatinine, Ser: 1.74 mg/dL — ABNORMAL HIGH (ref 0.44–1.00)
GFR, Estimated: 37 mL/min — ABNORMAL LOW (ref >60)
Glucose, Bld: 183 mg/dL — ABNORMAL HIGH (ref 70–99)
Potassium: 3.7 mmol/L (ref 3.5–5.1)
Sodium: 136 mmol/L (ref 135–145)
Total Bilirubin: 0.7 mg/dL (ref 0.3–1.2)
Total Protein: 6.4 g/dL — ABNORMAL LOW (ref 6.5–8.1)

## 2021-03-30 LAB — SYNOVIAL CELL COUNT + DIFF, W/ CRYSTALS
Crystals, Fluid: NONE SEEN
WBC, Synovial: 2 /mm3 (ref 0–200)

## 2021-03-30 LAB — IRON AND TIBC
Iron: 7 ug/dL — ABNORMAL LOW (ref 28–170)
Saturation Ratios: 4 % — ABNORMAL LOW (ref 10.4–31.8)
TIBC: 176 ug/dL — ABNORMAL LOW (ref 250–450)
UIBC: 169 ug/dL

## 2021-03-30 LAB — GC/CHLAMYDIA PROBE AMP (~~LOC~~) NOT AT ARMC
Chlamydia: NEGATIVE
Comment: NEGATIVE
Comment: NORMAL
Neisseria Gonorrhea: NEGATIVE

## 2021-03-30 LAB — PROTEIN, BODY FLUID (OTHER): Total Protein, Body Fluid Other: <0.2 g/dL

## 2021-03-30 LAB — GLUCOSE, BODY FLUID OTHER: Glucose, Body Fluid Other: <2 mg/dL

## 2021-03-30 LAB — FERRITIN: Ferritin: 467 ng/mL — ABNORMAL HIGH (ref 11–307)

## 2021-03-30 MED ORDER — FERROUS SULFATE 325 (65 FE) MG PO TABS
325.0000 mg | ORAL_TABLET | Freq: Every day | ORAL | Status: DC
  Administered 2021-03-31: 325 mg via ORAL
  Filled 2021-03-30: qty 1

## 2021-03-30 NOTE — Progress Notes (Signed)
MRI called to see if pt will cooperate with plan for MRI today since refusing 9/22, pt agreed to have MRI done, she said yesterday she was in pain

## 2021-03-30 NOTE — Plan of Care (Signed)

## 2021-03-30 NOTE — Progress Notes (Signed)
PROGRESS NOTE    Suzanne Graham  WEX:[id number] DOB: Apr 26, 1981 DOA: 03/28/2021 PCP: [name], FNP (Inactive)   Chief Complaint  Patient presents with   Joint Pain   Fever   Brief Narrative:  40 yo F with hx HTN, asthma, CVA, gout, HTN presenting with polyarticular joint pain, leukocytosis and fevers.  Imaging with findings concerning for possible septic arthritis and orthopedics and ID were involved.  She's been admitted for further management.  Notable, she was admitted from 9/3-5 with AKI and acute on chronic gout flare.  At that time pt had synovial fluid with MSU crystals  Assessment & Plan:   Principal Problem:   Sepsis ([place]) Active Problems:   Current smoker   Septic arthritis (HCC)   CKD (chronic kidney disease), stage III (HCC)   Microcytic anemia  Systemic Inflammatory Response Syndrome  Polyarticular Joint Pain  Gout vs Septic Arthritis vs other inflammatory arthropathy: Patient presents with complaints of multiple joint pain and swelling.  Found to be febrile up to 101.2 F with tachycardia, white blood cell count elevated up to 23.9, CRP 33.2, ESR 110, and uric acid was 9.5.  - MRI concerning for septic arthritis of bilateral SI joints and septic arthritis and possible adjacent abscess of right L5-S1 facet joint - MRI L elbow with joint effusion and olecranon bursitis, possibly 2/2 gout arthropathy.  Low grade muscular edema in extensor digitorum muscle, flexor digitorum superficialis muscle, and lesser degree of edema along margins of distal triceps musculature (unclear significance). Anterior bundle of ucl attaches past sublime tubercal (tear vs normal variant).  Suspected sprain of proximal lateral ulnar collateral ligament.  Small non gragmented osteochondral lesion of posterior capitellum.   -Follow-up blood cultures - s/p aspiration of L elbow -> WBC count 2, no crystals seen.  Glucose <2, total protein <2.  Culture pending. -Check GC and chlamydia -  negative - follow ANA, RF, anti CCP -Colchicine 0.6 mg twice daily -Prednisone 60 mg daily -Holding off antibiotics until able to obtain aspirate -Appreciate ID consultative services,  will follow-up for any further recommendation -Appreciate IR consultative services for IR drainage and culture -Recheck CBC with differential in [place].m. -Consider need of PT/OT evaluation in [place].m. -Would benefit from being referred to rheumatology in outpatient setting - referral placed for discharge   Chronic kidney disease stage IIIa: Patient presents with creatinine of 1.48 with BUN 25.  Hospitalization earlier this month creatinine had trended up to 2.88, but have been trending back down.  Thought to be secondary to NSAID induced kidney injury, but she denies any continued use ibuprofen.  On the differential includes possibility of still resolving acute kidney injury. -Monitor intake and output -Normal saline IV fluids 100 mL/h -Avoid nephrotoxic agents -creatinine to 1.7, trend   Pyuria Follow urine culture  Iron Def Anemia: Hemoglobin appears relatively stable at 10.4 g/dL which appears patient's baseline.  Patient is likely iron deficient with low MCV and MCH given she still has menstrual periods. - labs with iron def - follow b12, folate - supplement iron - downtrending with IVF   Essential hypertension: On admission blood pressures noted to be relatively stable.  Home blood pressure medications include amlodipine 10 mg daily and hydrochlorothiazide -Continue amlodipine - stop diuretics with gout   Tobacco abuse -Continue to counsel on need of cessation of tobacco -Patient declined need of nicotine patch  DVT prophylaxis: lovneox Code Status: full  Family Communication: mother at bedside - mom concerned about stroke, but discussed with  pain, her limitation to L arm and R hip not unexpected - daughter thinks limited due to pain Disposition:   Status is: Inpatient  Remains inpatient  appropriate because:Inpatient level of care appropriate due to severity of illness  Dispo: The patient is from: Home              Anticipated d/c is to: Home              Patient currently is not medically stable to d/c.   Difficult to place patient No       Consultants:  IR Ortho ID  Procedures: Technically successful fluoro guided lavage aspiration of the left elbow joint 9/22  Antimicrobials:  Anti-infectives (From admission, onward)    None          Subjective: C/o pain to elbow and wrist on L and R hip  Objective: Vitals:   03/29/21 1944 03/30/21 0625 03/30/21 0836 03/30/21 1452  BP: 113/69 (!) 125/52 119/72 115/76  Pulse: 92 98 86 82  Resp: _0 Temp: 98.8 F (37.1 C) 98 F (36.7 C) 98 F (36.7 C) 98.4 F (36.9 C)  TempSrc: Oral Oral Oral Oral  SpO2: 94%  100% 99%  Weight:      Height:        Intake/Output Summary (Last 24 hours) at 03/30/2021 1757 Last data filed at 03/30/2021 1400 Gross per 24 hour  Intake 1326.33 ml  Output --  Net 1326.33 ml   Filed Weights   03/28/21 1408  Weight: 90.7 kg    Examination:  General exam: Appears calm and comfortable  Respiratory system: Clear to auscultation. Respiratory effort normal. Cardiovascular system: RRR Gastrointestinal system: Abdomen is nondistended, soft and nontender. Central nervous system: Alert and oriented. No focal neurological deficits. Extremities: synovitis to PIP's and MCP's of L hand, ttp to L wrist and elbow.  TTP to lower back. Psychiatry: Judgement and insight appear normal. Mood & affect appropriate.     Data Reviewed: I have personally reviewed following labs and imaging studies  CBC: Recent Labs  Lab 03/24/21 1213 03/28/21 1426 03/30/21 0220  WBC 20.3* 23.9* 20.4*  NEUTROABS  --  22.1* 19.2*  HGB 10.3* 10.4* 8.5*  HCT 31.4* 32.6* 25.9*  MCV 76.2* 77.4* 75.3*  PLT 520* 420* 591    Basic Metabolic Panel: Recent Labs  Lab 03/24/21 1213 03/28/21 1426  03/30/21 0220  NA 135 133* 136  K 4.0 3.7 3.7  CL 97* 96* 101  CO2 _1 GLUCOSE 93 112* 183*  BUN 28* 25* 33*  CREATININE 1.50* 1.48* 1.74*  CALCIUM 9.5 9.4 8.6*    GFR: Estimated Creatinine Clearance: 45.9 mL/min (Nadirah Socorro) (by C-G formula based on SCr of 1.74 mg/dL (H)).  Liver Function Tests: Recent Labs  Lab 03/29/21 0409 03/30/21 0220  AST 15 19  ALT 32 26  ALKPHOS 149* 137*  BILITOT 1.1 0.7  PROT 8.1 6.4*  ALBUMIN 2.6* 2.0*    CBG: No results for input(s): GLUCAP in the last 168 hours.   Recent Results (from the past 240 hour(s))  Resp Panel by RT-PCR (Flu Stefan Markarian&B, Covid) Nasopharyngeal Swab     Status: None   Collection Time: 03/29/21  4:09 AM   Specimen: Nasopharyngeal Swab; Nasopharyngeal(NP) swabs in vial transport medium  Result Value Ref Range Status   SARS Coronavirus 2 by RT PCR NEGATIVE NEGATIVE Final    Comment: (NOTE) SARS-CoV-2 target nucleic acids are NOT DETECTED.  The SARS-CoV-2  RNA is generally detectable in upper respiratory specimens during the acute phase of infection. The lowest concentration of SARS-CoV-2 viral copies this assay can detect is 138 copies/mL. Anslie Spadafora negative result does not preclude SARS-Cov-2 infection and should not be used as the sole basis for treatment or other patient management decisions. Ezra Marquess negative result may occur with  improper specimen collection/handling, submission of specimen other than nasopharyngeal swab, presence of viral mutation(s) within the areas targeted by this assay, and inadequate number of viral copies(<138 copies/mL). Ranell Finelli negative result must be combined with clinical observations, patient history, and epidemiological information. The expected result is Negative.  Fact Sheet for Patients:  EntrepreneurPulse.com.au  Fact Sheet for Healthcare Providers:  IncredibleEmployment.be  This test is no t yet approved or cleared by the [place] FDA and  has been authorized  for detection and/or diagnosis of SARS-CoV-2 by FDA under an Emergency Use Authorization (EUA). This EUA will remain  in effect (meaning this test can be used) for the duration of the COVID-19 declaration under Section 564(b)(1) of the Act, 21 U.S.C.section 360bbb-3(b)(1), unless the authorization is terminated  or revoked sooner.       Influenza Elizibeth Breau by PCR NEGATIVE NEGATIVE Final   Influenza B by PCR NEGATIVE NEGATIVE Final    Comment: (NOTE) The Xpert Xpress SARS-CoV-2/FLU/RSV plus assay is intended as an aid in the diagnosis of influenza from Nasopharyngeal swab specimens and should not be used as Tyra Gural sole basis for treatment. Nasal washings and aspirates are unacceptable for Xpert Xpress SARS-CoV-2/FLU/RSV testing.  Fact Sheet for Patients: EntrepreneurPulse.com.au  Fact Sheet for Healthcare Providers: IncredibleEmployment.be  This test is not yet approved or cleared by the [place] FDA and has been authorized for detection and/or diagnosis of SARS-CoV-2 by FDA under an Emergency Use Authorization (EUA). This EUA will remain in effect (meaning this test can be used) for the duration of the COVID-19 declaration under Section 564(b)(1) of the Act, 21 U.S.C. section 360bbb-3(b)(1), unless the authorization is terminated or revoked.  Performed at [facility], [place] [address], [place], [place] [address]   Blood Culture (routine x 2)     Status: None (Preliminary result)   Collection Time: 03/29/21  4:09 AM   Specimen: BLOOD  Result Value Ref Range Status   Specimen Description BLOOD RIGHT ANTECUBITAL  Final   Special Requests   Final    BOTTLES DRAWN AEROBIC AND ANAEROBIC Blood Culture adequate volume   Culture   Final    NO GROWTH < 12 HOURS Performed at [facility], [place] [address] [place], [place] [address]    Report Status PENDING  Incomplete  Body fluid culture w Gram Stain     Status: None (Preliminary result)    Collection Time: 03/29/21  3:19 PM   Specimen: Body Fluid  Result Value Ref Range Status   Specimen Description FLUID SYNOVIAL  Final   Special Requests LEFT ELBOW  Final   Gram Stain   Final    NO SQUAMOUS EPITHELIAL CELLS SEEN NO WBC SEEN NO ORGANISMS SEEN    Culture   Final    NO GROWTH < 24 HOURS Performed at [facility], [place] [address], [place], [place] [address]    Report Status PENDING  Incomplete         Radiology Studies: MR ELBOW LEFT WO CONTRAST  Result Date: 03/30/2021 CLINICAL DATA:  Multiple joint pain including the left elbow, history of gout. EXAM: MRI OF THE LEFT ELBOW  WITHOUT CONTRAST TECHNIQUE: Multiplanar, multisequence MR imaging of the elbow was performed. No intravenous contrast was administered. COMPARISON:  03/28/2021 radiograph FINDINGS: Despite efforts by the technologist and patient, motion artifact is present on today's exam and could not be eliminated. This reduces exam sensitivity and specificity. TENDONS Common forearm flexor origin: Mild tendinopathy, with low-level edema tracking in the flexor digitorum superficialis muscle. Common forearm extensor origin: No tendinopathy but there is edema tracking in the proximal extensor digitorum muscle. Biceps: Unremarkable Triceps: Substantial distal triceps tendinopathy potentially with mild partial tearing on image 11 series 8. Low-level peripheral edema along the margins of the triceps muscle for example on image 19 series 5. LIGAMENTS Medial stabilizers: The attachment of the anterior bundle of the ulnar collateral ligament is 2-3 mm past the sublime tubercle as shown on image 12 series 7. Although this can be Uriah Trueba sign of Peregrine Nolt tear, it has been recognized that this can sometimes be Chasitie Passey normal variant. Lateral stabilizers: Mildly accentuated signal proximally in the lateral ulnar collateral ligament raising the possibility of sprain. The LUCL and radial collateral ligament appear otherwise unremarkable. Cartilage: No  focal chondral defect is identified. Joint: Moderate to large joint effusion. Cubital tunnel: Unremarkable Bones: Subtle marrow edema posteriorly in the capitellum, questionable overlying small non-fragmented osteochondral lesion. Soft tissues: There is fluid in the olecranon bursa suggesting olecranon bursitis. Subcutaneous edema is present medially and laterally in the elbow. IMPRESSION: 1. Joint effusion and olecranon bursitis. This could be Channelle Bottger manifestation of gout arthropathy. 2. Subcutaneous edema posterolaterally and medially along the elbow. 3. Low-grade muscular edema in the extensor digitorum muscle and flexor digitorum superficialis muscle, with lesser degree of edema along the margins of the distal triceps musculature, significance uncertain. 4. Mild common flexor tendinopathy. 5. Anterior bundle of the ulnar collateral ligament attaches just past the sublime tubercle. Although this can reflect Brealynn Contino tear, it is been recognize that sometimes this can also be Authur Cubit normal variant. 6. Suspected sprain of the proximal lateral ulnar collateral ligament. 7. Small non-fragmented osteochondral lesion of the posterior capitellum with underlying marrow edema. Electronically Signed   By: Van Clines M.D.   On: 03/30/2021 11:28   MR HIP RIGHT WO CONTRAST  Result Date: 03/29/2021 CLINICAL DATA:  Right hip pain elbow pain and wrist pain and swelling. EXAM: MR OF THE RIGHT HIP WITHOUT CONTRAST TECHNIQUE: Multiplanar, multisequence MR imaging was performed. No intravenous contrast was administered. COMPARISON:  None. FINDINGS: Examination is quite limited and patient would not complete the exam. There are bilateral SI joint effusions. There is also some surrounding inflammation/edema worrisome for septic arthritis. I do not see any significant or obvious bony changes of osteomyelitis. Small bilateral hip joint effusions are also noted. The pubic symphysis is unremarkable. Inflammation/edema is noted in the lower  paravertebral muscles and there is Alexis Mizuno fluid collection associated with the right L5-S1 facet joint. Could not exclude septic arthritis and focal abscess. Lumbar spine MRI without and with contrast may be helpful for further evaluation. I reviewed the elbow films from 03/28/2021 and the patient definitely has Izzy Courville large elbow joint effusion and I would be worried about septic arthritis at this joint also. MR imaging is suggested if able. IMPRESSION: 1. Bilateral SI joint effusions and surrounding inflammation/edema worrisome for septic arthritis. No obvious bony changes of osteomyelitis. 2. Small bilateral hip joint effusions. 3. Findings worrisome for septic arthritis and possible adjacent abscess associated with the right L5-S1 facet joint. 4. Reviewing the patient's left elbow radiographs, there is  Leiah Giannotti large joint effusion and I would be worried about septic arthritis at this joint also. Electronically Signed   By: Marijo Sanes M.D.   On: 03/29/2021 09:30   DG Chest Port 1 View  Result Date: 03/29/2021 CLINICAL DATA:  Questionable sepsis EXAM: PORTABLE CHEST 1 VIEW COMPARISON:  03/11/2021 FINDINGS: Loop recorder overlies the left heart border. The heart size and mediastinal contours are within normal limits. No focal pulmonary opacity. No pleural effusion or pneumothorax. The visualized skeletal structures are unremarkable. IMPRESSION: No active disease. Electronically Signed   By: Merilyn Baba M.D.   On: 03/29/2021 03:53   DG FLUORO GUIDED NEEDLE PLC ASPIRATION/INJECTION LOC  Result Date: 03/29/2021 INDICATION: Left elbow joint effusion. History of gout. Please perform fluoroscopic guided L aspiration for diagnostic purposes. EXAM: FLUORO GUIDED NEEDLE PLACEMENT AND/OR ASPIRATION COMPARISON:  Left elbow radiographs-earlier same day MEDICATIONS: None FLUOROSCOPY TIME:  3 minutes, 12 seconds (3.2 mGy) COMPLICATIONS: None immediate TECHNIQUE: Informed written consent was obtained from the patient after Zalen Sequeira  discussion of the risks, benefits and alternatives to treatment. The patient was placed supine on the fluoroscopy table and the left upper extremity flexed approximately 90 degrees at both the shoulder and elbow, though note, positioning proved challenging secondary to patient's exquisite elbow pain. The lateral aspect of the elbow was prepped and draped in the usual sterile fashion. Kenson Groh timeout was performed prior to the initiation of the procedure. The radiocapitellar joint was marked fluoroscopically and the overlying soft tissues were anesthetized with 1% lidocaine. Under intermittent fluoroscopic guidance, utilizing Shiva Sahagian slightly posterior to anterior approach, initially an 18 gauge needle was targeted towards the joint space, however ultimately the elbow joint space was successfully accessed with the use of Bruna Dills 20 gauge spinal needle. Appropriate positioning was confirmed with the injection of Khyli Swaim small amount of contrast. Multiple fluoroscopic images were saved procedural documentation purposes. No fluid was able to be aspirated from the elbow joint and as such, Taran Hable small amount of saline was infused and subsequently aspirated. At this point the procedure was terminated. The needle was withdrawn and Alizandra Loh dressing was placed. The patient tolerated the procedure well without immediate post procedural complication. FINDINGS: Initial attempted injection demonstrate extra articular contrast extravasation. Ultimately, with joint successfully accessed with Holten Spano 20 gauge needle, contrast injection demonstrated brisk opacification of the elbow joint. IMPRESSION: Challenging though ultimately technically successful fluoroscopic guided lavage aspiration of the left elbow. Electronically Signed   By: Sandi Mariscal M.D.   On: 03/29/2021 16:11   DG Hip Unilat W or Wo Pelvis 2-3 Views Right  Result Date: 03/28/2021 CLINICAL DATA:  Right hip pain. EXAM: DG HIP (WITH OR WITHOUT PELVIS) 2-3V RIGHT COMPARISON:  None. FINDINGS: There is no  acute fracture or dislocation. The bones are well mineralized. No significant arthritic changes. The soft tissues are unremarkable. IMPRESSION: Negative. Electronically Signed   By: Anner Crete M.D.   On: 03/28/2021 19:52        Scheduled Meds:  amLODipine  10 mg Oral Daily   colchicine  0.6 mg Oral BID   enoxaparin (LOVENOX) injection  40 mg Subcutaneous Q24H   predniSONE  60 mg Oral Q breakfast   sodium chloride flush  3 mL Intravenous Q12H   Continuous Infusions:  sodium chloride 100 mL/hr at 03/30/21 1516     LOS: 1 day    Time spent: over 30 min    Fayrene Helper, MD [facility]   To contact the attending provider between 7A-7P or  the covering provider during after hours 7P-7A, please log into the web site [email or web address] and access using universal [place] password for that web site. If you do not have the password, please call the hospital operator.  03/30/2021, 5:57 PM

## 2021-03-31 DIAGNOSIS — M1380 Other specified arthritis, unspecified site: Secondary | ICD-10-CM

## 2021-03-31 DIAGNOSIS — R6510 Systemic inflammatory response syndrome (SIRS) of non-infectious origin without acute organ dysfunction: Secondary | ICD-10-CM

## 2021-03-31 DIAGNOSIS — M109 Gout, unspecified: Secondary | ICD-10-CM

## 2021-03-31 DIAGNOSIS — A419 Sepsis, unspecified organism: Secondary | ICD-10-CM

## 2021-03-31 LAB — COMPREHENSIVE METABOLIC PANEL
ALT: 26 U/L (ref 0–44)
AST: 29 U/L (ref 15–41)
Albumin: 1.7 g/dL — ABNORMAL LOW (ref 3.5–5.0)
Alkaline Phosphatase: 119 U/L (ref 38–126)
Anion gap: 9 (ref 5–15)
BUN: 32 mg/dL — ABNORMAL HIGH (ref 6–20)
CO2: 22 mmol/L (ref 22–32)
Calcium: 8.1 mg/dL — ABNORMAL LOW (ref 8.9–10.3)
Chloride: 107 mmol/L (ref 98–111)
Creatinine, Ser: 1.12 mg/dL — ABNORMAL HIGH (ref 0.44–1.00)
GFR, Estimated: >60 mL/min (ref >60)
Glucose, Bld: 110 mg/dL — ABNORMAL HIGH (ref 70–99)
Potassium: 4 mmol/L (ref 3.5–5.1)
Sodium: 138 mmol/L (ref 135–145)
Total Bilirubin: 0.4 mg/dL (ref 0.3–1.2)
Total Protein: 6.1 g/dL — ABNORMAL LOW (ref 6.5–8.1)

## 2021-03-31 LAB — CBC WITH DIFFERENTIAL/PLATELET
Abs Immature Granulocytes: 0.17 10*3/uL — ABNORMAL HIGH (ref 0.00–0.07)
Basophils Absolute: 0 10*3/uL (ref 0.0–0.1)
Basophils Relative: 0 %
Eosinophils Absolute: 0 10*3/uL (ref 0.0–0.5)
Eosinophils Relative: 0 %
HCT: 25.3 % — ABNORMAL LOW (ref 36.0–46.0)
Hemoglobin: 8 g/dL — ABNORMAL LOW (ref 12.0–15.0)
Immature Granulocytes: 1 %
Lymphocytes Relative: 5 %
Lymphs Abs: 0.9 10*3/uL (ref 0.7–4.0)
MCH: 24.2 pg — ABNORMAL LOW (ref 26.0–34.0)
MCHC: 31.6 g/dL (ref 30.0–36.0)
MCV: 76.7 fL — ABNORMAL LOW (ref 80.0–100.0)
Monocytes Absolute: 1.1 10*3/uL — ABNORMAL HIGH (ref 0.1–1.0)
Monocytes Relative: 6 %
Neutro Abs: 16.6 10*3/uL — ABNORMAL HIGH (ref 1.7–7.7)
Neutrophils Relative %: 88 %
Platelets: 386 10*3/uL (ref 150–400)
RBC: 3.3 MIL/uL — ABNORMAL LOW (ref 3.87–5.11)
RDW: 14.5 % (ref 11.5–15.5)
WBC: 18.9 10*3/uL — ABNORMAL HIGH (ref 4.0–10.5)
nRBC: 0 % (ref 0.0–0.2)

## 2021-03-31 LAB — VITAMIN B12: Vitamin B-12: 209 pg/mL (ref 180–914)

## 2021-03-31 LAB — RHEUMATOID FACTOR: Rheumatoid fact SerPl-aCnc: 26.3 IU/mL — ABNORMAL HIGH (ref <14.0)

## 2021-03-31 LAB — SEDIMENTATION RATE: Sed Rate: 120 mm/hr — ABNORMAL HIGH (ref 0–22)

## 2021-03-31 LAB — C-REACTIVE PROTEIN: CRP: 18.1 mg/dL — ABNORMAL HIGH (ref <1.0)

## 2021-03-31 LAB — FOLATE: Folate: 4 ng/mL — ABNORMAL LOW (ref >5.9)

## 2021-03-31 LAB — ANA W/REFLEX IF POSITIVE: Anti Nuclear Antibody (ANA): NEGATIVE

## 2021-03-31 MED ORDER — DICLOFENAC SODIUM 1 % EX GEL
2.0000 g | Freq: Four times a day (QID) | CUTANEOUS | Status: DC
  Administered 2021-03-31: 2 g via TOPICAL
  Filled 2021-03-31: qty 100

## 2021-03-31 MED ORDER — FOLIC ACID 1 MG PO TABS
1.0000 mg | ORAL_TABLET | Freq: Every day | ORAL | 0 refills | Status: DC
  Filled 2021-03-31: qty 30, 30d supply, fill #0

## 2021-03-31 MED ORDER — FERROUS SULFATE 325 (65 FE) MG PO TABS
325.0000 mg | ORAL_TABLET | Freq: Every day | ORAL | 0 refills | Status: DC
  Filled 2021-03-31: qty 30, 30d supply, fill #0

## 2021-03-31 MED ORDER — DICLOFENAC SODIUM 1 % EX GEL
2.0000 g | Freq: Four times a day (QID) | CUTANEOUS | 0 refills | Status: DC | PRN
  Filled 2021-03-31: qty 50, fill #0

## 2021-03-31 MED ORDER — PREDNISONE 10 MG PO TABS: ORAL_TABLET | ORAL | 0 refills | Status: AC

## 2021-03-31 MED ORDER — FOLIC ACID 1 MG PO TABS: 1.0000 mg | ORAL_TABLET | Freq: Every day | ORAL | 0 refills | Status: DC

## 2021-03-31 MED ORDER — VITAMIN B-12 1000 MCG PO TABS: 1000.0000 ug | ORAL_TABLET | Freq: Every day | ORAL | Status: DC

## 2021-03-31 MED ORDER — DICLOFENAC SODIUM 1 % EX GEL: 2.0000 g | Freq: Four times a day (QID) | CUTANEOUS | 0 refills | Status: DC | PRN

## 2021-03-31 MED ORDER — CYANOCOBALAMIN 1000 MCG PO TABS
1000.0000 ug | ORAL_TABLET | Freq: Every day | ORAL | 0 refills | Status: DC
Start: 2021-04-01 — End: 2021-03-31
  Filled 2021-03-31: qty 30, 30d supply, fill #0

## 2021-03-31 MED ORDER — METHYLPREDNISOLONE SODIUM SUCC 40 MG IJ SOLR: 40.0000 mg | Freq: Every day | INTRAMUSCULAR | Status: DC

## 2021-03-31 MED ORDER — FERROUS SULFATE 325 (65 FE) MG PO TABS: 325.0000 mg | ORAL_TABLET | Freq: Every day | ORAL | 0 refills | Status: DC

## 2021-03-31 MED ORDER — METHYLPREDNISOLONE SODIUM SUCC 125 MG IJ SOLR
60.0000 mg | Freq: Every day | INTRAMUSCULAR | Status: AC
  Administered 2021-03-31: 60 mg via INTRAVENOUS
  Filled 2021-03-31: qty 0.96

## 2021-03-31 MED ORDER — CYANOCOBALAMIN 1000 MCG PO TABS: 1000.0000 ug | ORAL_TABLET | Freq: Every day | ORAL | 0 refills | Status: DC

## 2021-03-31 MED ORDER — HYDROMORPHONE HCL 1 MG/ML IJ SOLN
0.5000 mg | INTRAMUSCULAR | Status: DC | PRN
  Administered 2021-03-31: 0.5 mg via INTRAVENOUS
  Filled 2021-03-31: qty 0.5

## 2021-03-31 MED ORDER — COLCHICINE 0.6 MG PO TABS: 0.6000 mg | ORAL_TABLET | Freq: Two times a day (BID) | ORAL | 0 refills | Status: DC

## 2021-03-31 MED ORDER — FOLIC ACID 1 MG PO TABS
1.0000 mg | ORAL_TABLET | Freq: Every day | ORAL | Status: DC
  Administered 2021-03-31: 1 mg via ORAL
  Filled 2021-03-31: qty 1

## 2021-03-31 MED ORDER — COLCHICINE 0.6 MG PO TABS
0.6000 mg | ORAL_TABLET | Freq: Two times a day (BID) | ORAL | 0 refills | Status: DC
  Filled 2021-03-31: qty 60, 30d supply, fill #0

## 2021-03-31 NOTE — Progress Notes (Signed)
PROGRESS NOTE    Suzanne Graham  QJJ:[id number] DOB: [birth year] DOA: 03/28/2021 PCP: Suzanne Glatter, FNP (Inactive)   Chief Complaint  Patient presents with   Joint Pain   Fever   Brief Narrative:  40 yo F with hx HTN, asthma, CVA, gout, HTN presenting with polyarticular joint pain, leukocytosis and fevers.  Imaging with findings concerning for possible septic arthritis and orthopedics and ID were involved.  She's been admitted for further management.  Notable, she was admitted from 9/3-5 with AKI and acute on chronic gout flare.  At that time pt had synovial fluid with MSU crystals  Assessment & Plan:   Principal Problem:   Sepsis ([place]) Active Problems:   Current smoker   Septic arthritis (HCC)   CKD (chronic kidney disease), stage III (HCC)   Microcytic anemia  Systemic Inflammatory Response Syndrome  Polyarticular Joint Pain  Gout vs Septic Arthritis vs other inflammatory arthropathy: Patient presents with complaints of multiple joint pain and swelling.  Found to be febrile up to 101.2 F with tachycardia, white blood cell count elevated up to 23.9, CRP 33.2, ESR 110, and uric acid was 9.5.  - MRI concerning for septic arthritis of bilateral SI joints and septic arthritis and possible adjacent abscess of right L5-S1 facet joint - MRI L elbow with joint effusion and olecranon bursitis, possibly 2/2 gout arthropathy.  Low grade muscular edema in extensor digitorum muscle, flexor digitorum superficialis muscle, and lesser degree of edema along margins of distal triceps musculature (unclear significance). Anterior bundle of ucl attaches past sublime tubercal (tear vs normal variant).  Suspected sprain of proximal lateral ulnar collateral ligament.  Small non gragmented osteochondral lesion of posterior capitellum.   -Follow-up blood cultures - s/p aspiration of L elbow -> WBC count 2, no crystals seen.  Glucose <2, total protein <2.  Culture NG. -Check GC and chlamydia -  negative - follow ANA (pending), RF (positive), anti CCP (pending) -Colchicine 0.6 mg twice daily -Prednisone 60 mg daily (dose of solumedrol x1 today) -voltaren -Holding off antibiotics until able to obtain aspirate -Appreciate ID consultative services,  will follow-up for any further recommendation -Appreciate IR consultative services for IR drainage and culture -Recheck CBC with differential in Suzanne Graham.m. -Consider need of PT/OT evaluation in Suzanne Graham.m. -Would benefit from being referred to rheumatology in outpatient setting - referral placed for discharge   Chronic kidney disease stage IIIa: Patient presents with creatinine of 1.48 with BUN 25.  Hospitalization earlier this month creatinine had trended up to 2.88, but have been trending back down.  Thought to be secondary to NSAID induced kidney injury, but she denies any continued use ibuprofen.  On the differential includes possibility of still resolving acute kidney injury. -Monitor intake and output -Normal saline IV fluids 100 mL/h -Avoid nephrotoxic agents -creatinine to 1.1, trend   Pyuria Follow urine culture  Iron Def Anemia  Folate Def  Low Normal B12: Hemoglobin appears relatively stable at 10.4 g/dL which appears patient's baseline.  Patient is likely iron deficient with low MCV and MCH given she still has menstrual periods. - labs with iron def - follow b12 low normal, follow MMA, supplement, folate low - supplement - supplement iron - downtrending with IVF   Essential hypertension: On admission blood pressures noted to be relatively stable.  Home blood pressure medications include amlodipine 10 mg daily and hydrochlorothiazide -Continue amlodipine - stop diuretics with gout   Tobacco abuse -Continue to counsel on need of cessation of tobacco -Patient declined need  of nicotine patch  DVT prophylaxis: lovneox Code Status: full  Family Communication: none at bedside Disposition:   Status is: Inpatient  Remains inpatient  appropriate because:Inpatient level of care appropriate due to severity of illness  Dispo: The patient is from: Home              Anticipated d/c is to: Home              Patient currently is not medically stable to d/c.   Difficult to place patient No       Consultants:  IR Ortho ID  Procedures: Technically successful fluoro guided lavage aspiration of the left elbow joint 9/22  Antimicrobials:  Anti-infectives (From admission, onward)    None          Subjective: Gradually improving, continued L wrist pain  Objective: Vitals:   03/30/21 0836 03/30/21 1452 03/30/21 2058 03/31/21 0852  BP: 119/72 115/76 114/82 114/79  Pulse: 86 82 81 72  Resp: _0 Temp: 98 F (36.7 C) 98.4 F (36.9 C) 98.4 F (36.9 C)   TempSrc: Oral Oral Oral   SpO2: 100% 99% 100% 100%  Weight:      Height:        Intake/Output Summary (Last 24 hours) at 03/31/2021 1129 Last data filed at 03/31/2021 1117 Gross per 24 hour  Intake 2636.67 ml  Output --  Net 2636.67 ml   Filed Weights   03/28/21 1408  Weight: 90.7 kg    Examination:  General: No acute distress. Cardiovascular:RRR Lungs: unlabored Abdomen: Soft, nontender, nondistended  Neurological: Alert and oriented 3. Moves all extremities 4 with equal strength. Cranial nerves II through XII grossly intact. Skin: Warm and dry. No rashes or lesions. Extremities: TTP to L wrist     Data Reviewed: I have personally reviewed following labs and imaging studies  CBC: Recent Labs  Lab 03/24/21 1213 03/28/21 1426 03/30/21 0220 03/31/21 0525  WBC 20.3* 23.9* 20.4* 18.9*  NEUTROABS  --  22.1* 19.2* 16.6*  HGB 10.3* 10.4* 8.5* 8.0*  HCT 31.4* 32.6* 25.9* 25.3*  MCV 76.2* 77.4* 75.3* 76.7*  PLT 520* 420* 349 569    Basic Metabolic Panel: Recent Labs  Lab 03/24/21 1213 03/28/21 1426 03/30/21 0220 03/31/21 0525  NA 135 133* 136 138  K 4.0 3.7 3.7 4.0  CL 97* 96* 101 107  CO2 _1 GLUCOSE 93 112*  183* 110*  BUN 28* 25* 33* 32*  CREATININE 1.50* 1.48* 1.74* 1.12*  CALCIUM 9.5 9.4 8.6* 8.1*    GFR: Estimated Creatinine Clearance: 71.4 mL/min (Suzanne Graham) (by C-G formula based on SCr of 1.12 mg/dL (H)).  Liver Function Tests: Recent Labs  Lab 03/29/21 0409 03/30/21 0220 03/31/21 0525  AST _2 ALT 32 26 26  ALKPHOS 149* 137* 119  BILITOT 1.1 0.7 0.4  PROT 8.1 6.4* 6.1*  ALBUMIN 2.6* 2.0* 1.7*    CBG: No results for input(s): GLUCAP in the last 168 hours.   Recent Results (from the past 240 hour(s))  Resp Panel by RT-PCR (Flu Nathalee Smarr&B, Covid) Nasopharyngeal Swab     Status: None   Collection Time: 03/29/21  4:09 AM   Specimen: Nasopharyngeal Swab; Nasopharyngeal(NP) swabs in vial transport medium  Result Value Ref Range Status   SARS Coronavirus 2 by RT PCR NEGATIVE NEGATIVE Final    Comment: (NOTE) SARS-CoV-2 target nucleic acids are NOT DETECTED.  The SARS-CoV-2 RNA is generally detectable in upper respiratory  specimens during the acute phase of infection. The lowest concentration of SARS-CoV-2 viral copies this assay can detect is 138 copies/mL. Kimiya Brunelle negative result does not preclude SARS-Cov-2 infection and should not be used as the sole basis for treatment or other patient management decisions. Ardean Melroy negative result may occur with  improper specimen collection/handling, submission of specimen other than nasopharyngeal swab, presence of viral mutation(s) within the areas targeted by this assay, and inadequate number of viral copies(<138 copies/mL). Sana Tessmer negative result must be combined with clinical observations, patient history, and epidemiological information. The expected result is Negative.  Fact Sheet for Patients:  EntrepreneurPulse.com.au  Fact Sheet for Healthcare Providers:  IncredibleEmployment.be  This test is no t yet approved or cleared by the [place] FDA and  has been authorized for detection and/or diagnosis of  SARS-CoV-2 by FDA under an Emergency Use Authorization (EUA). This EUA will remain  in effect (meaning this test can be used) for the duration of the COVID-19 declaration under Section 564(b)(1) of the Act, 21 U.S.C.section 360bbb-3(b)(1), unless the authorization is terminated  or revoked sooner.       Influenza Shawnese Magner by PCR NEGATIVE NEGATIVE Final   Influenza B by PCR NEGATIVE NEGATIVE Final    Comment: (NOTE) The Xpert Xpress SARS-CoV-2/FLU/RSV plus assay is intended as an aid in the diagnosis of influenza from Nasopharyngeal swab specimens and should not be used as Adaleen Hulgan sole basis for treatment. Nasal washings and aspirates are unacceptable for Xpert Xpress SARS-CoV-2/FLU/RSV testing.  Fact Sheet for Patients: EntrepreneurPulse.com.au  Fact Sheet for Healthcare Providers: IncredibleEmployment.be  This test is not yet approved or cleared by the [place] FDA and has been authorized for detection and/or diagnosis of SARS-CoV-2 by FDA under an Emergency Use Authorization (EUA). This EUA will remain in effect (meaning this test can be used) for the duration of the COVID-19 declaration under Section 564(b)(1) of the Act, 21 U.S.C. section 360bbb-3(b)(1), unless the authorization is terminated or revoked.  Performed at [facility], [place] [address] [place], [place] [address]   Blood Culture (routine x 2)     Status: None (Preliminary result)   Collection Time: 03/29/21  4:09 AM   Specimen: BLOOD  Result Value Ref Range Status   Specimen Description BLOOD RIGHT ANTECUBITAL  Final   Special Requests   Final    BOTTLES DRAWN AEROBIC AND ANAEROBIC Blood Culture adequate volume   Culture   Final    NO GROWTH 2 DAYS Performed at [facility], [place] [address], [place], [place] [address]    Report Status PENDING  Incomplete  Blood Culture (routine x 2)     Status: None (Preliminary result)   Collection Time: 03/29/21 12:21 PM    Specimen: BLOOD  Result Value Ref Range Status   Specimen Description BLOOD SITE NOT SPECIFIED  Final   Special Requests   Final    BOTTLES DRAWN AEROBIC AND ANAEROBIC Blood Culture adequate volume   Culture   Final    NO GROWTH 2 DAYS Performed at [facility], [place] [address], [place], [place] [address]    Report Status PENDING  Incomplete  Body fluid culture w Gram Stain     Status: None (Preliminary result)   Collection Time: 03/29/21  3:19 PM   Specimen: Body Fluid  Result Value Ref Range Status   Specimen Description FLUID SYNOVIAL  Final   Special Requests LEFT ELBOW  Final   Gram Stain   Final  NO SQUAMOUS EPITHELIAL CELLS SEEN NO WBC SEEN NO ORGANISMS SEEN    Culture   Final    NO GROWTH 2 DAYS Performed at [facility], [place] [address] [place], [place] [address]    Report Status PENDING  Incomplete         Radiology Studies: MR ELBOW LEFT WO CONTRAST  Result Date: 03/30/2021 CLINICAL DATA:  Multiple joint pain including the left elbow, history of gout. EXAM: MRI OF THE LEFT ELBOW WITHOUT CONTRAST TECHNIQUE: Multiplanar, multisequence MR imaging of the elbow was performed. No intravenous contrast was administered. COMPARISON:  03/28/2021 radiograph FINDINGS: Despite efforts by the technologist and patient, motion artifact is present on today's exam and could not be eliminated. This reduces exam sensitivity and specificity. TENDONS Common forearm flexor origin: Mild tendinopathy, with low-level edema tracking in the flexor digitorum superficialis muscle. Common forearm extensor origin: No tendinopathy but there is edema tracking in the proximal extensor digitorum muscle. Biceps: Unremarkable Triceps: Substantial distal triceps tendinopathy potentially with mild partial tearing on image 11 series 8. Low-level peripheral edema along the margins of the triceps muscle for example on image 19 series 5. LIGAMENTS Medial stabilizers: The attachment of the anterior  bundle of the ulnar collateral ligament is 2-3 mm past the sublime tubercle as shown on image 12 series 7. Although this can be Frederich Montilla sign of Jarnell Cordaro tear, it has been recognized that this can sometimes be Deigo Alonso normal variant. Lateral stabilizers: Mildly accentuated signal proximally in the lateral ulnar collateral ligament raising the possibility of sprain. The LUCL and radial collateral ligament appear otherwise unremarkable. Cartilage: No focal chondral defect is identified. Joint: Moderate to large joint effusion. Cubital tunnel: Unremarkable Bones: Subtle marrow edema posteriorly in the capitellum, questionable overlying small non-fragmented osteochondral lesion. Soft tissues: There is fluid in the olecranon bursa suggesting olecranon bursitis. Subcutaneous edema is present medially and laterally in the elbow. IMPRESSION: 1. Joint effusion and olecranon bursitis. This could be Jenie Parish manifestation of gout arthropathy. 2. Subcutaneous edema posterolaterally and medially along the elbow. 3. Low-grade muscular edema in the extensor digitorum muscle and flexor digitorum superficialis muscle, with lesser degree of edema along the margins of the distal triceps musculature, significance uncertain. 4. Mild common flexor tendinopathy. 5. Anterior bundle of the ulnar collateral ligament attaches just past the sublime tubercle. Although this can reflect Taleigha Pinson tear, it is been recognize that sometimes this can also be Keziah Avis normal variant. 6. Suspected sprain of the proximal lateral ulnar collateral ligament. 7. Small non-fragmented osteochondral lesion of the posterior capitellum with underlying marrow edema. Electronically Signed   By: Van Clines M.D.   On: 03/30/2021 11:28   DG FLUORO GUIDED NEEDLE PLC ASPIRATION/INJECTION LOC  Result Date: 03/29/2021 INDICATION: Left elbow joint effusion. History of gout. Please perform fluoroscopic guided L aspiration for diagnostic purposes. EXAM: FLUORO GUIDED NEEDLE PLACEMENT AND/OR ASPIRATION  COMPARISON:  Left elbow radiographs-earlier same day MEDICATIONS: None FLUOROSCOPY TIME:  3 minutes, 12 seconds (3.2 mGy) COMPLICATIONS: None immediate TECHNIQUE: Informed written consent was obtained from the patient after Tanae Petrosky discussion of the risks, benefits and alternatives to treatment. The patient was placed supine on the fluoroscopy table and the left upper extremity flexed approximately 90 degrees at both the shoulder and elbow, though note, positioning proved challenging secondary to patient's exquisite elbow pain. The lateral aspect of the elbow was prepped and draped in the usual sterile fashion. Balthazar Dooly timeout was performed prior to the initiation of the procedure. The radiocapitellar joint was marked  fluoroscopically and the overlying soft tissues were anesthetized with 1% lidocaine. Under intermittent fluoroscopic guidance, utilizing Caidan Hubbert slightly posterior to anterior approach, initially an 18 gauge needle was targeted towards the joint space, however ultimately the elbow joint space was successfully accessed with the use of Duwane Gewirtz 20 gauge spinal needle. Appropriate positioning was confirmed with the injection of Renold Kozar small amount of contrast. Multiple fluoroscopic images were saved procedural documentation purposes. No fluid was able to be aspirated from the elbow joint and as such, Ellery Tash small amount of saline was infused and subsequently aspirated. At this point the procedure was terminated. The needle was withdrawn and Barton Want dressing was placed. The patient tolerated the procedure well without immediate post procedural complication. FINDINGS: Initial attempted injection demonstrate extra articular contrast extravasation. Ultimately, with joint successfully accessed with Sunjai Levandoski 20 gauge needle, contrast injection demonstrated brisk opacification of the elbow joint. IMPRESSION: Challenging though ultimately technically successful fluoroscopic guided lavage aspiration of the left elbow. Electronically Signed   By: Sandi Mariscal  M.D.   On: 03/29/2021 16:11        Scheduled Meds:  amLODipine  10 mg Oral Daily   colchicine  0.6 mg Oral BID   diclofenac Sodium  2 g Topical QID   enoxaparin (LOVENOX) injection  40 mg Subcutaneous Q24H   ferrous sulfate  325 mg Oral Q breakfast   folic acid  1 mg Oral Daily   methylPREDNISolone (SOLU-MEDROL) injection  40 mg Intravenous Q1400   sodium chloride flush  3 mL Intravenous Q12H   [START ON 04/01/2021] vitamin B-12  1,000 mcg Oral Daily   Continuous Infusions:  sodium chloride 100 mL/hr at 03/31/21 0216     LOS: 2 days    Time spent: over 11 min    Fayrene Helper, MD [facility]   To contact the attending provider between 7A-7P or the covering provider during after hours 7P-7A, please log into the web site [email or web address] and access using universal [place] password for that web site. If you do not have the password, please call the hospital operator.  03/31/2021, 11:29 AM

## 2021-03-31 NOTE — Plan of Care (Signed)

## 2021-03-31 NOTE — Progress Notes (Signed)
Provided discharge instructions/education, all questions and concerns addressed, Pt not in acute distress. Pt to discharge home with belongings accompanied by her mother.

## 2021-03-31 NOTE — Progress Notes (Signed)
[facility]  Date of Admission:  03/28/2021        Abx: none  ASSESSMENT: Oligoarthritis Gout crystal right hip  No gi sx or respiratory sx to suggest gi arthropathy/reactive granulomatous respiratory infectious rheumatism  Urine gc/chlam negative Patient denies high risk sexual behavior No recent travel/insect-mosquito bites No lft abnormality  Will defer virologic serology  So far seems polyarticular gout   Query palindromic rheumatism   Overall sx improving with prednisone Repeat tap of different joint (left elbow minimal wbc and no crystal and cx negative) Should f/u outpatient rheumatology (referred by primary team)   PLAN: Continue antiinflammatory meds From id standpoint no need for further infectious serology testing Agree with outpatient rheumatology evaluation Ok to dc from id perspective  Discussed with primary team I spent more than 35 minute reviewing data/chart, and coordinating care and >50% direct face to face time providing counseling/discussing diagnostics/treatment plan with patient   Principal Problem:   Sepsis (HCC) Active Problems:   Current smoker   Septic arthritis (HCC)   CKD (chronic kidney disease), stage III (HCC)   Microcytic anemia   Allergies  Allergen Reactions   Nsaids Other (See Comments)    Likely causing AKI    Scheduled Meds:  amLODipine  10 mg Oral Daily   colchicine  0.6 mg Oral BID   diclofenac Sodium  2 g Topical QID   enoxaparin (LOVENOX) injection  40 mg Subcutaneous Q24H   ferrous sulfate  325 mg Oral Q breakfast   folic acid  1 mg Oral Daily   sodium chloride flush  3 mL Intravenous Q12H   [START ON 04/01/2021] vitamin B-12  1,000 mcg Oral Daily   Continuous Infusions:  sodium chloride 100 mL/hr at 03/31/21 1430   PRN Meds:.acetaminophen **OR** acetaminophen, albuterol, hydrALAZINE, HYDROmorphone (DILAUDID) injection, ondansetron **OR** ondansetron (ZOFRAN) IV,  sodium chloride (PF), sodium chloride (PF)   SUBJECTIVE: Reviewed labs with patient Reviewed sexual risk hx --> no sexual relationship, prior std, or recent sexual encounter Urine gc/chlam negative No rash No headache No cough No chest pain No diarrhea No recent travel  Left wrist pain most severe but much improved since admission Fever resolved Wbc improving  Review of Systems: ROS All other ROS was negative, except mentioned above     OBJECTIVE: Vitals:   03/30/21 1452 03/30/21 2058 03/31/21 0852 03/31/21 1510  BP: 115/76 114/82 114/79 125/88  Pulse: 82 81 72 71  Resp: 16 18 18 18   Temp: 98.4 F (36.9 C) 98.4 F (36.9 C)  98.6 F (37 C)  TempSrc: Oral Oral  Oral  SpO2: 99% 100% 100% 99%  Weight:      Height:       Body mass index is 34.33 kg/m.  Physical Exam  General/constitutional: no distress, pleasant HEENT: Normocephalic, PER, Conj Clear, EOMI, Oropharynx clear Neck supple CV: rrr no mrg Lungs: clear to auscultation, normal respiratory effort Abd: Soft, Nontender Ext: no edema Skin: No Rash Neuro: nonfocal MSK: slight effusion left wrist and tender, relatively intact active rom though. Good active rom bilateral hip/elbows/ankles   Lab Results Lab Results  Component Value Date   WBC 18.9 (H) 03/31/2021   HGB 8.0 (L) 03/31/2021   HCT 25.3 (L) 03/31/2021   MCV 76.7 (L) 03/31/2021   PLT 386 03/31/2021    Lab Results  Component Value Date   CREATININE 1.12 (H) 03/31/2021   BUN 32 (H) 03/31/2021   NA  138 03/31/2021   K 4.0 03/31/2021   CL 107 03/31/2021   CO2 22 03/31/2021    Lab Results  Component Value Date   ALT 26 03/31/2021   AST 29 03/31/2021   ALKPHOS 119 03/31/2021   BILITOT 0.4 03/31/2021      Microbiology: Recent Results (from the past 240 hour(s))  Resp Panel by RT-PCR (Flu A&B, Covid) Nasopharyngeal Swab     Status: None   Collection Time: 03/29/21  4:09 AM   Specimen: Nasopharyngeal Swab; Nasopharyngeal(NP) swabs in  vial transport medium  Result Value Ref Range Status   SARS Coronavirus 2 by RT PCR NEGATIVE NEGATIVE Final    Comment: (NOTE) SARS-CoV-2 target nucleic acids are NOT DETECTED.  The SARS-CoV-2 RNA is generally detectable in upper respiratory specimens during the acute phase of infection. The lowest concentration of SARS-CoV-2 viral copies this assay can detect is 138 copies/mL. A negative result does not preclude SARS-Cov-2 infection and should not be used as the sole basis for treatment or other patient management decisions. A negative result may occur with  improper specimen collection/handling, submission of specimen other than nasopharyngeal swab, presence of viral mutation(s) within the areas targeted by this assay, and inadequate number of viral copies(<138 copies/mL). A negative result must be combined with clinical observations, patient history, and epidemiological information. The expected result is Negative.  Fact Sheet for Patients:  BloggerCourse.com  Fact Sheet for Healthcare Providers:  SeriousBroker.it  This test is no t yet approved or cleared by the [place] FDA and  has been authorized for detection and/or diagnosis of SARS-CoV-2 by FDA under an Emergency Use Authorization (EUA). This EUA will remain  in effect (meaning this test can be used) for the duration of the COVID-19 declaration under Section 564(b)(1) of the Act, 21 U.S.C.section 360bbb-3(b)(1), unless the authorization is terminated  or revoked sooner.       Influenza A by PCR NEGATIVE NEGATIVE Final   Influenza B by PCR NEGATIVE NEGATIVE Final    Comment: (NOTE) The Xpert Xpress SARS-CoV-2/FLU/RSV plus assay is intended as an aid in the diagnosis of influenza from Nasopharyngeal swab specimens and should not be used as a sole basis for treatment. Nasal washings and aspirates are unacceptable for Xpert Xpress SARS-CoV-2/FLU/RSV testing.  Fact  Sheet for Patients: BloggerCourse.com  Fact Sheet for Healthcare Providers: SeriousBroker.it  This test is not yet approved or cleared by the [place] FDA and has been authorized for detection and/or diagnosis of SARS-CoV-2 by FDA under an Emergency Use Authorization (EUA). This EUA will remain in effect (meaning this test can be used) for the duration of the COVID-19 declaration under Section 564(b)(1) of the Act, 21 U.S.C. section 360bbb-3(b)(1), unless the authorization is terminated or revoked.  Performed at [facility], [address] [place], [place] [address]   Blood Culture (routine x 2)     Status: None (Preliminary result)   Collection Time: 03/29/21  4:09 AM   Specimen: BLOOD  Result Value Ref Range Status   Specimen Description BLOOD RIGHT ANTECUBITAL  Final   Special Requests   Final    BOTTLES DRAWN AEROBIC AND ANAEROBIC Blood Culture adequate volume   Culture   Final    NO GROWTH 2 DAYS Performed at [facility], [address] [place], [place] [address]    Report Status PENDING  Incomplete  Blood Culture (routine x 2)     Status: None (Preliminary result)   Collection Time: 03/29/21 12:21  PM   Specimen: BLOOD  Result Value Ref Range Status   Specimen Description BLOOD SITE NOT SPECIFIED  Final   Special Requests   Final    BOTTLES DRAWN AEROBIC AND ANAEROBIC Blood Culture adequate volume   Culture   Final    NO GROWTH 2 DAYS Performed at [facility], [address] [place], [place] [address]    Report Status PENDING  Incomplete  Body fluid culture w Gram Stain     Status: None (Preliminary result)   Collection Time: 03/29/21  3:19 PM   Specimen: Body Fluid  Result Value Ref Range Status   Specimen Description FLUID SYNOVIAL  Final   Special Requests LEFT ELBOW  Final   Gram Stain   Final    NO SQUAMOUS EPITHELIAL CELLS SEEN NO WBC SEEN NO ORGANISMS SEEN    Culture   Final     NO GROWTH 2 DAYS Performed at [facility], [address] [place], [place] [address]    Report Status PENDING  Incomplete     Serology:   Imaging: If present, new imagings (plain films, ct scans, and mri) have been personally visualized and interpreted; radiology reports have been reviewed. Decision making incorporated into the Impression / Recommendations.   Raymondo Band, MD [facility] [phone] pager    03/31/2021, 3:27 PM

## 2021-03-31 NOTE — Discharge Summary (Addendum)
Physician Discharge Summary  Suzanne Graham WCB:[id number] DOB: Aug 21, 1980 DOA: 03/28/2021  PCP: [name], FNP (Inactive)  Admit date: 03/28/2021 Discharge date: 03/31/2021  Time spent: 40 minutes  Recommendations for Outpatient Follow-up:  Follow outpatient CBC/CMP Follow pending CCP.  RF positive. Follow pending MMA.  B12 low normal. Follow polyarticular joint pain, suspect due to gout - needs further workup with rheum Follow symptoms on steroid taper, colchicine, allopurinol - adjust regimen as needed Follow iron, folate deficiency outpatient Follow MRI L elbow with orthopedics outpatient - nonfragmented osteochondral lesion of posterior capitellum, etc.  Follow MRI of R hip with outpatient ortho as well.  Consider additional imaging, further workup as needed  Discharge Diagnoses:  Principal Problem:   Gout Active Problems:   Current smoker   Inflammatory arthritis   CKD (chronic kidney disease), stage III (HCC)   Microcytic anemia   Systemic inflammatory response syndrome (HCC)   Discharge Condition: stable  Diet recommendation: heart healthy  Filed Weights   03/28/21 1408  Weight: 90.7 kg    History of present illness:  40 yo F with hx HTN, asthma, CVA, gout, HTN presenting with polyarticular joint pain, leukocytosis and fevers.  Imaging with findings concerning for possible septic arthritis and orthopedics and ID were involved.  She's been admitted for further management.   Notable, she was admitted from 9/3-5 with AKI and acute on chronic gout flare.  At that time pt had synovial fluid with MSU crystals.  She was admitted with SIRS 2/2 inflammatory arthritis with septic arthritis on differential.  At this time, appears gout is most likely diagnosis, though other inflammatory arthropathies on ddx.  RA was positive with CCP pending.  Aspiration of L elbow with no growth from culture, no crystals seen.  ID, IR, orthopedics were consulted.  Plan now for steroid  taper, colchicine, outpatient rheumatology, PCP, and orthopedic follow up.  See below for additional details  Hospital Course:  Systemic Inflammatory Response Syndrome  Polyarticular Joint Pain  Gout vs Septic Arthritis vs other inflammatory arthropathy: Patient presents with complaints of multiple joint pain and swelling.  Found to be febrile up to 101.2 F with tachycardia, white blood cell count elevated up to 23.9, CRP 33.2, ESR 110, and uric acid was 9.5.  Sepsis ruled out with source of SIRS inflammatory arthritis. - MRI concerning for septic arthritis of bilateral SI joints and septic arthritis and possible adjacent abscess of right L5-S1 facet joint - MRI L elbow with joint effusion and olecranon bursitis, possibly 2/2 gout arthropathy.  Low grade muscular edema in extensor digitorum muscle, flexor digitorum superficialis muscle, and lesser degree of edema along margins of distal triceps musculature (unclear significance). Anterior bundle of ucl attaches past sublime tubercal (tear vs normal variant).  Suspected sprain of proximal lateral ulnar collateral ligament.  Small non fragmented osteochondral lesion of posterior capitellum. -recommended outpatient follow up with orthopedics for MRI findings in elbow, discussed with pt   -Follow-up blood cultures - s/p aspiration of L elbow -> WBC count 2, no crystals seen.  Glucose <2, total protein <2.  Culture NG. -Check GC and chlamydia - negative - follow ANA (negative), RF (positive), anti CCP (pending at discharge) -Colchicine 0.6 mg twice daily -Prednisone 60 taper.  She's improved with IV steroids today. -voltaren -Appreciate ID consultative services,  will follow-up for any further recommendation - recommending antiinflammatory meds, outpatient rheum eval -Would benefit from being referred to rheumatology in outpatient setting - referral placed for discharge   Chronic  kidney disease stage IIIa: Patient presents with creatinine of 1.48  with BUN 25.  Hospitalization earlier this month creatinine had trended up to 2.88, but have been trending back down.  Thought to be secondary to NSAID induced kidney injury, but she denies any continued use ibuprofen.  On the differential includes possibility of still resolving acute kidney injury. -Monitor intake and output -Normal saline IV fluids 100 mL/h -Avoid nephrotoxic agents -improved creatinine at discharge to 1.1   Pyuria Follow urine culture Asymptomatic, treat only if symptomatic   Anemia  Def Anemia  Folate Def  Low Normal B12: Hemoglobin appears relatively stable at 10.4 g/dL which appears patient's baseline.  Patient is likely iron deficient with low MCV and MCH given she still has menstrual periods. - labs with iron def - follow b12 low normal, follow MMA outpatient, supplement, folate low - supplement - d/c with iron, b12, folate supplementation  - downtrending with IVF - suspect dilutional   Essential hypertension: On admission blood pressures noted to be relatively stable.  Home blood pressure medications include amlodipine 10 mg daily and hydrochlorothiazide -Continue amlodipine, BP reasonable on this alone - stop diuretics with gout   Tobacco abuse -Continue to counsel on need of cessation of tobacco -Patient declined need of nicotine patch  Procedures: 9/22 IMPRESSION: Challenging though ultimately technically successful fluoroscopic guided lavage aspiration of the left elbow.  Consultations: ID Orthopedics IR  Discharge Exam: Vitals:   03/31/21 0852 03/31/21 1510  BP: 114/79 125/88  Pulse: 72 71  Resp: 18 18  Temp:  98.6 F (37 C)  SpO2: 100% 99%   See PN from today Feeling better after IV steroids Eager to d/c home  Discharge Instructions   Discharge Instructions     Ambulatory referral to Rheumatology   Complete by: As directed    Call MD for:  difficulty breathing, headache or visual disturbances   Complete by: As directed     Call MD for:  extreme fatigue   Complete by: As directed    Call MD for:  hives   Complete by: As directed    Call MD for:  persistant dizziness or light-headedness   Complete by: As directed    Call MD for:  persistant nausea and vomiting   Complete by: As directed    Call MD for:  redness, tenderness, or signs of infection (pain, swelling, redness, odor or green/yellow discharge around incision site)   Complete by: As directed    Call MD for:  severe uncontrolled pain   Complete by: As directed    Call MD for:  temperature >100.4   Complete by: As directed    Diet - low sodium heart healthy   Complete by: As directed    Discharge instructions   Complete by: As directed    You were seen for an inflammatory arthritis.  Gout is the most likely diagnosis.    You should follow up with a rheumatologist to complete your workup.  Your rheumatoid factor was positive.  Your anti CCP was pending at the time of discharge.  Your ANA was negative.  Please follow your pending results with your PCP and your rheumatologists.  We'll send you with a prolonged steroid taper.  Continue this and the colchicine.  Continue your allopurinol.  You'll need a refill of the colchicine from your PCP or the rheumatologist.  Stop your HCTZ.   Follow your final culture results with your PCP.  You have anemia.  You have iron  deficiency and folate deficiency.  Your b12 levels are low normal.  There's a pending MMA lab which should be followed up with your PCP.  We'll discharge you with supplemental iron, folate, and vitamin b12.  Follow this up with your PCP.  I sent your steroids to [facility].  I sent the other prescriptions to both [facility] and [facility].  If these medicines are too expensive at [facility], pick them up at [facility].   Return for new, recurrent, or worsening symptoms.  Please ask your PCP to request records from this hospitalization so they know what was done and  what the next steps will be.   Increase activity slowly   Complete by: As directed    No dressing needed   Complete by: As directed       Allergies as of 03/31/2021       Reactions   Nsaids Other (See Comments)   Likely causing AKI        Medication List     STOP taking these medications    hydrochlorothiazide 25 MG tablet Commonly known as: HYDRODIURIL       TAKE these medications    acetaminophen 500 MG tablet Commonly known as: TYLENOL Take 500 mg by mouth every 6 (six) hours as needed for moderate pain or headache. Notes to patient: Last dose given 09/24 04:33pm   albuterol 108 (90 Base) MCG/ACT inhaler Commonly known as: VENTOLIN HFA INHALE 2 PUFFS INTO THE LUNGS EVERY 4 (FOUR) HOURS AS NEEDED FOR WHEEZING OR SHORTNESS OF BREATH. Notes to patient: Resume home regimen   allopurinol 100 MG tablet Commonly known as: ZYLOPRIM Take 1 tablet (100 mg total) by mouth daily. Notes to patient: Resume home regimen   amLODipine 10 MG tablet Commonly known as: NORVASC TAKE 1 TABLET BY MOUTH DAILY What changed: how much to take   colchicine 0.6 MG tablet Take 1 tablet (0.6 mg total) by mouth 2 (two) times daily.   cyanocobalamin 1000 MCG tablet Take 1 tablet (1,000 mcg total) by mouth daily. Start taking on: April 01, 2021   diclofenac Sodium 1 % Gel Commonly known as: VOLTAREN Apply 2 g topically 4 (four) times daily as needed (musculoskeletal or joint pain).   ferrous sulfate 325 (65 FE) MG tablet Take 1 tablet (325 mg total) by mouth daily with breakfast. Start taking on: April 01, 538   folic acid 1 MG tablet Commonly known as: FOLVITE Take 1 tablet (1 mg total) by mouth daily. Start taking on: April 01, 2021   oxyCODONE-acetaminophen 5-325 MG tablet Commonly known as: PERCOCET/ROXICET Take 1 tablet by mouth every 8 (eight) hours as needed for severe pain. Notes to patient: Resume home regimen   predniSONE 10 MG tablet Commonly known  as: DELTASONE Take 6 tablets (60 mg total) by mouth daily for 4 days, THEN 4 tablets (40 mg total) daily for 4 days, THEN 3 tablets (30 mg total) daily for 4 days, THEN 2 tablets (20 mg total) daily for 4 days, THEN 1 tablet (10 mg total) daily for 4 days. Start taking on: March 31, 2021 What changed: See the new instructions.               Discharge Care Instructions  (From admission, onward)           Start     Ordered   03/31/21 0000  No dressing needed        03/31/21 1751  Allergies  Allergen Reactions   Nsaids Other (See Comments)    Likely causing AKI      The results of significant diagnostics from this hospitalization (including imaging, microbiology, ancillary and laboratory) are listed below for reference.    Significant Diagnostic Studies: DG ELBOW COMPLETE LEFT (3+VIEW)  Result Date: 03/28/2021 CLINICAL DATA:  Left elbow pain over the past several weeks without acute injury, initial encounter EXAM: LEFT ELBOW - COMPLETE 3+ VIEW COMPARISON:  None. FINDINGS: No acute fracture or dislocation is noted. Considerable soft tissue swelling is noted over the olecranon in the distal humerus posteriorly. IMPRESSION: Soft tissue swelling without acute bony abnormality. Electronically Signed   By: Inez Catalina M.D.   On: 03/28/2021 18:30   DG Wrist Complete Left  Result Date: 03/28/2021 CLINICAL DATA:  Left wrist pain for several weeks, no known injury, initial encounter EXAM: LEFT WRIST - COMPLETE 3+ VIEW COMPARISON:  None. FINDINGS: There is no evidence of fracture or dislocation. There is no evidence of arthropathy or other focal bone abnormality. Soft tissues are unremarkable. IMPRESSION: No acute abnormality noted. Electronically Signed   By: Inez Catalina M.D.   On: 03/28/2021 18:27   MR ELBOW LEFT WO CONTRAST  Result Date: 03/30/2021 CLINICAL DATA:  Multiple joint pain including the left elbow, history of gout. EXAM: MRI OF THE LEFT ELBOW WITHOUT  CONTRAST TECHNIQUE: Multiplanar, multisequence MR imaging of the elbow was performed. No intravenous contrast was administered. COMPARISON:  03/28/2021 radiograph FINDINGS: Despite efforts by the technologist and patient, motion artifact is present on today's exam and could not be eliminated. This reduces exam sensitivity and specificity. TENDONS Common forearm flexor origin: Mild tendinopathy, with low-level edema tracking in the flexor digitorum superficialis muscle. Common forearm extensor origin: No tendinopathy but there is edema tracking in the proximal extensor digitorum muscle. Biceps: Unremarkable Triceps: Substantial distal triceps tendinopathy potentially with mild partial tearing on image 11 series 8. Low-level peripheral edema along the margins of the triceps muscle for example on image 19 series 5. LIGAMENTS Medial stabilizers: The attachment of the anterior bundle of the ulnar collateral ligament is 2-3 mm past the sublime tubercle as shown on image 12 series 7. Although this can be Doloros Kwolek sign of Dawsyn Ramsaran tear, it has been recognized that this can sometimes be Gregg Winchell normal variant. Lateral stabilizers: Mildly accentuated signal proximally in the lateral ulnar collateral ligament raising the possibility of sprain. The LUCL and radial collateral ligament appear otherwise unremarkable. Cartilage: No focal chondral defect is identified. Joint: Moderate to large joint effusion. Cubital tunnel: Unremarkable Bones: Subtle marrow edema posteriorly in the capitellum, questionable overlying small non-fragmented osteochondral lesion. Soft tissues: There is fluid in the olecranon bursa suggesting olecranon bursitis. Subcutaneous edema is present medially and laterally in the elbow. IMPRESSION: 1. Joint effusion and olecranon bursitis. This could be Dellamae Rosamilia manifestation of gout arthropathy. 2. Subcutaneous edema posterolaterally and medially along the elbow. 3. Low-grade muscular edema in the extensor digitorum muscle and flexor  digitorum superficialis muscle, with lesser degree of edema along the margins of the distal triceps musculature, significance uncertain. 4. Mild common flexor tendinopathy. 5. Anterior bundle of the ulnar collateral ligament attaches just past the sublime tubercle. Although this can reflect Kaitlyn Franko tear, it is been recognize that sometimes this can also be Bertrand Vowels normal variant. 6. Suspected sprain of the proximal lateral ulnar collateral ligament. 7. Small non-fragmented osteochondral lesion of the posterior capitellum with underlying marrow edema. Electronically Signed   By: Cindra Eves.D.  On: 03/30/2021 11:28   MR HIP RIGHT WO CONTRAST  Result Date: 03/29/2021 CLINICAL DATA:  Right hip pain elbow pain and wrist pain and swelling. EXAM: MR OF THE RIGHT HIP WITHOUT CONTRAST TECHNIQUE: Multiplanar, multisequence MR imaging was performed. No intravenous contrast was administered. COMPARISON:  None. FINDINGS: Examination is quite limited and patient would not complete the exam. There are bilateral SI joint effusions. There is also some surrounding inflammation/edema worrisome for septic arthritis. I do not see any significant or obvious bony changes of osteomyelitis. Small bilateral hip joint effusions are also noted. The pubic symphysis is unremarkable. Inflammation/edema is noted in the lower paravertebral muscles and there is a fluid collection associated with the right L5-S1 facet joint. Could not exclude septic arthritis and focal abscess. Lumbar spine MRI without and with contrast may be helpful for further evaluation. I reviewed the elbow films from 03/28/2021 and the patient definitely has a large elbow joint effusion and I would be worried about septic arthritis at this joint also. MR imaging is suggested if able. IMPRESSION: 1. Bilateral SI joint effusions and surrounding inflammation/edema worrisome for septic arthritis. No obvious bony changes of osteomyelitis. 2. Small bilateral hip joint effusions.  3. Findings worrisome for septic arthritis and possible adjacent abscess associated with the right L5-S1 facet joint. 4. Reviewing the patient's left elbow radiographs, there is a large joint effusion and I would be worried about septic arthritis at this joint also. Electronically Signed   By: Marijo Sanes M.D.   On: 03/29/2021 09:30   DG Chest Port 1 View  Result Date: 03/29/2021 CLINICAL DATA:  Questionable sepsis EXAM: PORTABLE CHEST 1 VIEW COMPARISON:  03/11/2021 FINDINGS: Loop recorder overlies the left heart border. The heart size and mediastinal contours are within normal limits. No focal pulmonary opacity. No pleural effusion or pneumothorax. The visualized skeletal structures are unremarkable. IMPRESSION: No active disease. Electronically Signed   By: Merilyn Baba M.D.   On: 03/29/2021 03:53   DG CHEST PORT 1 VIEW  Result Date: 03/11/2021 CLINICAL DATA:  COVID positive EXAM: PORTABLE CHEST 1 VIEW COMPARISON:  Radiograph 06/10/2018, chest CT 06/10/2018 FINDINGS: Patient is slightly rotated towards the right. Borderline enlarged cardiac silhouette. There is a cardiac loop recorder overlying the lower mid chest. There are faint airspace opacities in the lower lobes bilaterally. There is no large pleural effusion or visible pneumothorax. No acute osseous abnormality. IMPRESSION: Faint airspace opacities in the lower lobes bilaterally, which could be a developing infectious/inflammatory process. Electronically Signed   By: Maurine Simmering M.D.   On: 03/11/2021 09:03   DG Shoulder Left  Result Date: 03/10/2021 CLINICAL DATA:  Patient complaining of pain in left shoulder and right knee. Pt has bilateral leg swelling. EXAM: LEFT SHOULDER - 2+ VIEW COMPARISON:  None. FINDINGS: There is no evidence of fracture or dislocation. There is no evidence of arthropathy or other focal bone abnormality. Soft tissues are unremarkable. IMPRESSION: Negative. Electronically Signed   By: Audie Pinto M.D.   On:  03/10/2021 16:35   DG Knee Complete 4 Views Right  Result Date: 03/10/2021 CLINICAL DATA:  Pt c/o pain in left shoulder and right knee. Pt has bilateral leg swelling. EXAM: RIGHT KNEE - COMPLETE 4+ VIEW COMPARISON:  None. FINDINGS: There is a small joint effusion. No evidence of acute fracture or dislocation. Mild tricompartmental osteoarthritis. Regional soft tissues are unremarkable. IMPRESSION: Small joint effusion.  No acute osseous abnormality. Electronically Signed   By: Audie Pinto M.D.   On:  03/10/2021 16:37   DG FLUORO GUIDED NEEDLE PLC ASPIRATION/INJECTION LOC  Result Date: 03/29/2021 INDICATION: Left elbow joint effusion. History of gout. Please perform fluoroscopic guided L aspiration for diagnostic purposes. EXAM: FLUORO GUIDED NEEDLE PLACEMENT AND/OR ASPIRATION COMPARISON:  Left elbow radiographs-earlier same day MEDICATIONS: None FLUOROSCOPY TIME:  3 minutes, 12 seconds (3.2 mGy) COMPLICATIONS: None immediate TECHNIQUE: Informed written consent was obtained from the patient after Tod Abrahamsen discussion of the risks, benefits and alternatives to treatment. The patient was placed supine on the fluoroscopy table and the left upper extremity flexed approximately 90 degrees at both the shoulder and elbow, though note, positioning proved challenging secondary to patient's exquisite elbow pain. The lateral aspect of the elbow was prepped and draped in the usual sterile fashion. Ismelda Weatherman timeout was performed prior to the initiation of the procedure. The radiocapitellar joint was marked fluoroscopically and the overlying soft tissues were anesthetized with 1% lidocaine. Under intermittent fluoroscopic guidance, utilizing Hillard Goodwine slightly posterior to anterior approach, initially an 18 gauge needle was targeted towards the joint space, however ultimately the elbow joint space was successfully accessed with the use of Vinod Mikesell 20 gauge spinal needle. Appropriate positioning was confirmed with the injection of Aparna Vanderweele small amount of  contrast. Multiple fluoroscopic images were saved procedural documentation purposes. No fluid was able to be aspirated from the elbow joint and as such, Vidhi Delellis small amount of saline was infused and subsequently aspirated. At this point the procedure was terminated. The needle was withdrawn and Jisele Price dressing was placed. The patient tolerated the procedure well without immediate post procedural complication. FINDINGS: Initial attempted injection demonstrate extra articular contrast extravasation. Ultimately, with joint successfully accessed with Davyn Elsasser 20 gauge needle, contrast injection demonstrated brisk opacification of the elbow joint. IMPRESSION: Challenging though ultimately technically successful fluoroscopic guided lavage aspiration of the left elbow. Electronically Signed   By: Sandi Mariscal M.D.   On: 03/29/2021 16:11   DG Hip Unilat W or Wo Pelvis 2-3 Views Right  Result Date: 03/28/2021 CLINICAL DATA:  Right hip pain. EXAM: DG HIP (WITH OR WITHOUT PELVIS) 2-3V RIGHT COMPARISON:  None. FINDINGS: There is no acute fracture or dislocation. The bones are well mineralized. No significant arthritic changes. The soft tissues are unremarkable. IMPRESSION: Negative. Electronically Signed   By: Anner Crete M.D.   On: 03/28/2021 19:52   VAS [place] LOWER EXTREMITY VENOUS (DVT) ([facility] and [facility] 7a-7p)  Result Date: 03/07/2021  Lower Venous DVT Study Patient Name:  ALEXCIA SCHOOLS  Date of Exam:   03/07/2021 Medical Rec #: [id number]       Accession #:    [id number] Date of Birth: March 08, 1981        Patient Gender: F Patient Age:   19 years Exam Location:  [facility] Procedure:      VAS [place] LOWER EXTREMITY VENOUS (DVT) Referring Phys: Domenic Moras --------------------------------------------------------------------------------  Indications: Swelling, and Pain.  Limitations: Patient positioning, clothing. Comparison Study: 07/18/17 prior Performing Technologist: Archie Patten RVS  Examination Guidelines: Ruthell Feigenbaum complete evaluation  includes B-mode imaging, spectral Doppler, color Doppler, and power Doppler as needed of all accessible portions of each vessel. Bilateral testing is considered an integral part of Emiley Digiacomo complete examination. Limited examinations for reoccurring indications may be performed as noted. The reflux portion of the exam is performed with the patient in reverse Trendelenburg.  +-----+---------------+---------+-----------+----------+-------------------+ RIGHTCompressibilityPhasicitySpontaneityPropertiesThrombus Aging      +-----+---------------+---------+-----------+----------+-------------------+ CFV  Not well visualized +-----+---------------+---------+-----------+----------+-------------------+   +---------+---------------+---------+-----------+----------+--------------+ LEFT     CompressibilityPhasicitySpontaneityPropertiesThrombus Aging +---------+---------------+---------+-----------+----------+--------------+ CFV      Full           Yes      Yes                                 +---------+---------------+---------+-----------+----------+--------------+ SFJ      Full                                                        +---------+---------------+---------+-----------+----------+--------------+ FV Prox  Full                                                        +---------+---------------+---------+-----------+----------+--------------+ FV Mid   Full                                                        +---------+---------------+---------+-----------+----------+--------------+ FV DistalFull                                                        +---------+---------------+---------+-----------+----------+--------------+ PFV      Full                                                        +---------+---------------+---------+-----------+----------+--------------+ POP      Full           Yes      Yes                                  +---------+---------------+---------+-----------+----------+--------------+ PTV      Full                                                        +---------+---------------+---------+-----------+----------+--------------+ PERO     Full                                                        +---------+---------------+---------+-----------+----------+--------------+    Summary: LEFT: - There is no evidence of deep vein thrombosis in the lower extremity.  - No cystic structure found in the popliteal fossa.  *See table(s) above for measurements and observations. Electronically signed by Durene Fruits  Brabham MD on 03/07/2021 at 6:27:34 PM.    Final     Microbiology: Recent Results (from the past 240 hour(s))  Resp Panel by RT-PCR (Flu A&B, Covid) Nasopharyngeal Swab     Status: None   Collection Time: 03/29/21  4:09 AM   Specimen: Nasopharyngeal Swab; Nasopharyngeal(NP) swabs in vial transport medium  Result Value Ref Range Status   SARS Coronavirus 2 by RT PCR NEGATIVE NEGATIVE Final    Comment: (NOTE) SARS-CoV-2 target nucleic acids are NOT DETECTED.  The SARS-CoV-2 RNA is generally detectable in upper respiratory specimens during the acute phase of infection. The lowest concentration of SARS-CoV-2 viral copies this assay can detect is 138 copies/mL. A negative result does not preclude SARS-Cov-2 infection and should not be used as the sole basis for treatment or other patient management decisions. A negative result may occur with  improper specimen collection/handling, submission of specimen other than nasopharyngeal swab, presence of viral mutation(s) within the areas targeted by this assay, and inadequate number of viral copies(<138 copies/mL). A negative result must be combined with clinical observations, patient history, and epidemiological information. The expected result is Negative.  Fact Sheet for Patients:  EntrepreneurPulse.com.au  Fact  Sheet for Healthcare Providers:  IncredibleEmployment.be  This test is no t yet approved or cleared by the [place] FDA and  has been authorized for detection and/or diagnosis of SARS-CoV-2 by FDA under an Emergency Use Authorization (EUA). This EUA will remain  in effect (meaning this test can be used) for the duration of the COVID-19 declaration under Section 564(b)(1) of the Act, 21 U.S.C.section 360bbb-3(b)(1), unless the authorization is terminated  or revoked sooner.       Influenza A by PCR NEGATIVE NEGATIVE Final   Influenza B by PCR NEGATIVE NEGATIVE Final    Comment: (NOTE) The Xpert Xpress SARS-CoV-2/FLU/RSV plus assay is intended as an aid in the diagnosis of influenza from Nasopharyngeal swab specimens and should not be used as a sole basis for treatment. Nasal washings and aspirates are unacceptable for Xpert Xpress SARS-CoV-2/FLU/RSV testing.  Fact Sheet for Patients: EntrepreneurPulse.com.au  Fact Sheet for Healthcare Providers: IncredibleEmployment.be  This test is not yet approved or cleared by the [place] FDA and has been authorized for detection and/or diagnosis of SARS-CoV-2 by FDA under an Emergency Use Authorization (EUA). This EUA will remain in effect (meaning this test can be used) for the duration of the COVID-19 declaration under Section 564(b)(1) of the Act, 21 U.S.C. section 360bbb-3(b)(1), unless the authorization is terminated or revoked.  Performed at [facility], [place] [address], [place], [place] [address]   Blood Culture (routine x 2)     Status: None (Preliminary result)   Collection Time: 03/29/21  4:09 AM   Specimen: BLOOD  Result Value Ref Range Status   Specimen Description BLOOD RIGHT ANTECUBITAL  Final   Special Requests   Final    BOTTLES DRAWN AEROBIC AND ANAEROBIC Blood Culture adequate volume   Culture   Final    NO GROWTH 2 DAYS Performed at [facility], [place] [address], [place], [place] [address]    Report Status PENDING  Incomplete  Blood Culture (routine x 2)     Status: None (Preliminary result)   Collection Time: 03/29/21 12:21 PM   Specimen: BLOOD  Result Value Ref Range Status   Specimen Description BLOOD SITE NOT SPECIFIED  Final   Special Requests   Final    BOTTLES DRAWN AEROBIC AND ANAEROBIC  Blood Culture adequate volume   Culture   Final    NO GROWTH 2 DAYS Performed at [facility], [place] [address], [place], [place] [address]    Report Status PENDING  Incomplete  Body fluid culture w Gram Stain     Status: None (Preliminary result)   Collection Time: 03/29/21  3:19 PM   Specimen: Body Fluid  Result Value Ref Range Status   Specimen Description FLUID SYNOVIAL  Final   Special Requests LEFT ELBOW  Final   Gram Stain   Final    NO SQUAMOUS EPITHELIAL CELLS SEEN NO WBC SEEN NO ORGANISMS SEEN    Culture   Final    NO GROWTH 2 DAYS Performed at [facility], [address], [place], [place] [address]    Report Status PENDING  Incomplete     Labs: Basic Metabolic Panel: Recent Labs  Lab 03/28/21 1426 03/30/21 0220 03/31/21 0525  NA 133* 136 138  K 3.7 3.7 4.0  CL 96* 101 107  CO2 _0 GLUCOSE 112* 183* 110*  BUN 25* 33* 32*  CREATININE 1.48* 1.74* 1.12*  CALCIUM 9.4 8.6* 8.1*   Liver Function Tests: Recent Labs  Lab 03/29/21 0409 03/30/21 0220 03/31/21 0525  AST _1 ALT 32 26 26  ALKPHOS 149* 137* 119  BILITOT 1.1 0.7 0.4  PROT 8.1 6.4* 6.1*  ALBUMIN 2.6* 2.0* 1.7*   No results for input(s): LIPASE, AMYLASE in the last 168 hours. No results for input(s): AMMONIA in the last 168 hours. CBC: Recent Labs  Lab 03/28/21 1426 03/30/21 0220 03/31/21 0525  WBC 23.9* 20.4* 18.9*  NEUTROABS 22.1* 19.2* 16.6*  HGB 10.4* 8.5* 8.0*  HCT 32.6* 25.9* 25.3*  MCV 77.4* 75.3* 76.7*  PLT 420* 349 386   Cardiac Enzymes: No results for input(s): CKTOTAL, CKMB, CKMBINDEX,  TROPONINI in the last 168 hours. BNP: BNP (last 3 results) No results for input(s): BNP in the last 8760 hours.  ProBNP (last 3 results) No results for input(s): PROBNP in the last 8760 hours.  CBG: No results for input(s): GLUCAP in the last 168 hours.     Signed:  Fayrene Helper MD.  [facility] 03/31/2021, 6:31 PM

## 2021-04-01 LAB — URINE CULTURE

## 2021-04-01 LAB — CYCLIC CITRUL PEPTIDE ANTIBODY, IGG/IGA: CCP Antibodies IgG/IgA: 4 units (ref 0–19)

## 2021-04-02 ENCOUNTER — Other Ambulatory Visit: Payer: Self-pay

## 2021-04-02 LAB — BODY FLUID CULTURE W GRAM STAIN
Culture: NO GROWTH
Gram Stain: NONE SEEN

## 2021-04-03 ENCOUNTER — Other Ambulatory Visit: Payer: Self-pay

## 2021-04-03 LAB — CULTURE, BLOOD (ROUTINE X 2)
Culture: NO GROWTH
Culture: NO GROWTH
Special Requests: ADEQUATE
Special Requests: ADEQUATE

## 2021-04-03 MED ORDER — DICLOFENAC SODIUM 1 % EX GEL
CUTANEOUS | 1 refills | Status: DC
  Filled 2021-04-03: qty 100, 12d supply, fill #0

## 2021-04-03 MED ORDER — VITAMIN B-12 1000 MCG PO TABS
ORAL_TABLET | ORAL | 1 refills | Status: DC
Start: 2021-03-31 — End: 2021-12-12

## 2021-04-03 MED ORDER — FOLIC ACID 1 MG PO TABS
ORAL_TABLET | ORAL | 1 refills | Status: DC
  Filled 2021-04-03: qty 30, 30d supply, fill #0
  Filled 2021-05-09 – 2021-05-17 (×2): qty 30, 30d supply, fill #1

## 2021-04-03 MED ORDER — COLCHICINE 0.6 MG PO TABS
ORAL_TABLET | ORAL | 1 refills | Status: DC
Start: 2021-03-31 — End: 2021-04-04
  Filled 2021-04-03: qty 60, 30d supply, fill #0

## 2021-04-03 MED ORDER — FERROUS SULFATE 325 (65 FE) MG PO TABS
ORAL_TABLET | ORAL | 1 refills | Status: DC
  Filled 2021-04-03: qty 30, 30d supply, fill #0
  Filled 2021-05-09 – 2021-05-17 (×2): qty 30, 30d supply, fill #1

## 2021-04-04 ENCOUNTER — Encounter: Payer: Self-pay | Admitting: Nurse Practitioner

## 2021-04-04 ENCOUNTER — Other Ambulatory Visit: Payer: Self-pay

## 2021-04-04 ENCOUNTER — Non-Acute Institutional Stay (HOSPITAL_COMMUNITY)
Admission: RE | Admit: 2021-04-04 | Discharge: 2021-04-04 | Disposition: A | Discharge status: Home / Self Care | Payer: Medicaid Other | Source: Ambulatory Visit | Attending: Internal Medicine | Admitting: Internal Medicine

## 2021-04-04 ENCOUNTER — Ambulatory Visit (INDEPENDENT_AMBULATORY_CARE_PROVIDER_SITE_OTHER): Payer: Self-pay | Admitting: Nurse Practitioner

## 2021-04-04 VITALS — BP 117/72 | HR 83 | Temp 98.1°F | Ht 64.0 in | Wt 179.4 lb

## 2021-04-04 DIAGNOSIS — D509 Iron deficiency anemia, unspecified: Secondary | ICD-10-CM | POA: Diagnosis present

## 2021-04-04 DIAGNOSIS — I10 Essential (primary) hypertension: Secondary | ICD-10-CM

## 2021-04-04 DIAGNOSIS — D649 Anemia, unspecified: Secondary | ICD-10-CM

## 2021-04-04 DIAGNOSIS — R195 Other fecal abnormalities: Secondary | ICD-10-CM

## 2021-04-04 LAB — COMPREHENSIVE METABOLIC PANEL
ALT: 24 U/L (ref 0–44)
AST: 13 U/L — ABNORMAL LOW (ref 15–41)
Albumin: 2.5 g/dL — ABNORMAL LOW (ref 3.5–5.0)
Alkaline Phosphatase: 75 U/L (ref 38–126)
Anion gap: 9 (ref 5–15)
BUN: 30 mg/dL — ABNORMAL HIGH (ref 6–20)
CO2: 24 mmol/L (ref 22–32)
Calcium: 8.7 mg/dL — ABNORMAL LOW (ref 8.9–10.3)
Chloride: 106 mmol/L (ref 98–111)
Creatinine, Ser: 0.97 mg/dL (ref 0.44–1.00)
GFR, Estimated: >60 mL/min (ref >60)
Glucose, Bld: 152 mg/dL — ABNORMAL HIGH (ref 70–99)
Potassium: 4 mmol/L (ref 3.5–5.1)
Sodium: 139 mmol/L (ref 135–145)
Total Bilirubin: 0.4 mg/dL (ref 0.3–1.2)
Total Protein: 6.3 g/dL — ABNORMAL LOW (ref 6.5–8.1)

## 2021-04-04 LAB — LIPID PANEL
Cholesterol: 117 mg/dL (ref 0–200)
HDL: 38 mg/dL — ABNORMAL LOW (ref >40)
LDL Cholesterol: 67 mg/dL (ref 0–99)
Total CHOL/HDL Ratio: 3.1 RATIO
Triglycerides: 61 mg/dL (ref <150)
VLDL: 12 mg/dL (ref 0–40)

## 2021-04-04 LAB — CBC WITH DIFFERENTIAL/PLATELET
Abs Immature Granulocytes: 0.29 10*3/uL — ABNORMAL HIGH (ref 0.00–0.07)
Basophils Absolute: 0 10*3/uL (ref 0.0–0.1)
Basophils Relative: 0 %
Eosinophils Absolute: 0 10*3/uL (ref 0.0–0.5)
Eosinophils Relative: 0 %
HCT: 28.7 % — ABNORMAL LOW (ref 36.0–46.0)
Hemoglobin: 9.1 g/dL — ABNORMAL LOW (ref 12.0–15.0)
Immature Granulocytes: 2 %
Lymphocytes Relative: 4 %
Lymphs Abs: 0.8 10*3/uL (ref 0.7–4.0)
MCH: 24.5 pg — ABNORMAL LOW (ref 26.0–34.0)
MCHC: 31.7 g/dL (ref 30.0–36.0)
MCV: 77.4 fL — ABNORMAL LOW (ref 80.0–100.0)
Monocytes Absolute: 0.5 10*3/uL (ref 0.1–1.0)
Monocytes Relative: 3 %
Neutro Abs: 18.2 10*3/uL — ABNORMAL HIGH (ref 1.7–7.7)
Neutrophils Relative %: 91 %
Platelets: 429 10*3/uL — ABNORMAL HIGH (ref 150–400)
RBC: 3.71 MIL/uL — ABNORMAL LOW (ref 3.87–5.11)
RDW: 15.1 % (ref 11.5–15.5)
WBC: 19.9 10*3/uL — ABNORMAL HIGH (ref 4.0–10.5)
nRBC: 0 % (ref 0.0–0.2)

## 2021-04-04 LAB — IRON AND TIBC
Iron: 87 ug/dL (ref 28–170)
Saturation Ratios: 31 % (ref 10.4–31.8)
TIBC: 278 ug/dL (ref 250–450)
UIBC: 191 ug/dL

## 2021-04-04 LAB — FERRITIN: Ferritin: 150 ng/mL (ref 11–307)

## 2021-04-04 LAB — FOLATE: Folate: 7 ng/mL (ref >5.9)

## 2021-04-04 NOTE — Progress Notes (Signed)
[facility] Patient [facility] [address] [place], [place]  [address] Phone:  [phone]   Fax:  [phone] Subjective:   Patient ID: Suzanne Graham, female    DOB: 1980/11/24, 40 y.o.   MRN: [id number]  Chief Complaint  Patient presents with   Hospitalization Follow-up    03/28/2021 - 03/31/2021; Gout. Pt states that she is still having bodyaches and pains.    HPI Afiya Ferrebee 40 y.o. female with history of acid reflux, asthma, depression, gout, hypertension and stroke to the [facility] for hospital follow up. Patient states she was admitted to the hospital from 03/28/21 - 03/31/21 for gout pain and swelling. Pain located in the bilateral feet and left elbow. States that since discharge symptoms have improved, with some fluctuations in pain and swelling. Verbalizes being unable to collect medication from pharmacy until yesterday. Mild to moderate pain during visit today, which increases with weightbearing, in the bilateral feet, and movement of the left elbow.   Denies any acute problems since discharge from the hospital. Denies any fever, chest pain, shortness of breath, HA or dizziness. Denies any abdominal pain, nausea/ vomiting or diarrhea. Last bowel movement this morning, normal. Denies any dark stools or gross blood in stools.   Patient denies any alcohol or NSAID usage since discharge. States that her last intake of alcohol 3-4 wks ago, prior to that time she consumed a fifth of liquor per day.  Currently compliant with all prescribed medications. Denies any HA, dizziness, blurred vision, numbness or tingling. Mother at bedside during visit.  Past Medical History:  Diagnosis Date   Acid reflux 08/2019   Asthma    Depression    Gout    Hypertension    Stroke [facility])     Past Surgical History:  Procedure Laterality Date   LOOP RECORDER INSERTION N/A 08/19/2017   Procedure: LOOP RECORDER INSERTION;  Surgeon: Hillis Range, MD;  Location: [facility] INVASIVE CV LAB;  Service:  Cardiovascular;  Laterality: N/A;   TEE WITHOUT CARDIOVERSION N/A 08/19/2017   Procedure: TRANSESOPHAGEAL ECHOCARDIOGRAM (TEE);  Surgeon: Pricilla Riffle, MD;  Location: [facility] ENDOSCOPY;  Service: Cardiovascular;  Laterality: N/A;    Family History  Problem Relation Age of Onset   Heart failure Mother    Hypertension Mother    Stroke Neg Hx     Social History   Socioeconomic History   Marital status: Single    Spouse name: Not on file   Number of children: Not on file   Years of education: Not on file   Highest education level: Not on file  Occupational History   Not on file  Tobacco Use   Smoking status: Every Day    Packs/day: 0.25    Types: Cigarettes   Smokeless tobacco: Never  Vaping Use   Vaping Use: Never used  Substance and Sexual Activity   Alcohol use: Not Currently    Comment: rare   Drug use: No   Sexual activity: Not Currently  Other Topics Concern   Not on file  Social History Narrative   Not on file   Social Determinants of Health   Financial Resource Strain: Not on file  Food Insecurity: Not on file  Transportation Needs: Not on file  Physical Activity: Not on file  Stress: Not on file  Social Connections: Not on file  Intimate Partner Violence: Not on file    Outpatient Medications Prior to Visit  Medication Sig Dispense Refill   acetaminophen (TYLENOL) 500 MG tablet  Take 500 mg by mouth every 6 (six) hours as needed for moderate pain or headache.     albuterol (VENTOLIN HFA) 108 (90 Base) MCG/ACT inhaler INHALE 2 PUFFS INTO THE LUNGS EVERY 4 (FOUR) HOURS AS NEEDED FOR WHEEZING OR SHORTNESS OF BREATH. 8.5 g 11   amLODipine (NORVASC) 10 MG tablet TAKE 1 TABLET BY MOUTH DAILY (Patient taking differently: Take 10 mg by mouth daily.) 30 tablet 6   diclofenac Sodium (VOLTAREN) 1 % GEL Apply 2 grams to affected area four times daily as needed (for musculoskeletal or joint pain) 100 g 1   ferrous sulfate 325 (65 FE) MG tablet Take 1 tab by mouth daily with  breakfast 30 tablet 1   folic acid (FOLVITE) 1 MG tablet take 1 tab by mouth daily 30 tablet 1   predniSONE (DELTASONE) 10 MG tablet Take 6 tablets (60 mg total) by mouth daily for 4 days, THEN 4 tablets (40 mg total) daily for 4 days, THEN 3 tablets (30 mg total) daily for 4 days, THEN 2 tablets (20 mg total) daily for 4 days, THEN 1 tablet (10 mg total) daily for 4 days. 64 tablet 0   vitamin B-12 (CYANOCOBALAMIN) 1000 MCG tablet take 1 tab by mouth daily 30 tablet 1   ferrous sulfate 325 (65 FE) MG tablet Take 1 tablet (325 mg total) by mouth daily with breakfast. 30 tablet 0   allopurinol (ZYLOPRIM) 100 MG tablet Take 1 tablet (100 mg total) by mouth daily. (Patient not taking: Reported on 04/04/2021) 30 tablet 0   colchicine 0.6 MG tablet Take 1 tablet (0.6 mg total) by mouth 2 (two) times daily. (Patient not taking: Reported on 04/04/2021) 60 tablet 0   cyanocobalamin 1000 MCG tablet Take 1 tablet (1,000 mcg total) by mouth daily. (Patient not taking: Reported on 04/04/2021) 30 tablet 0   folic acid (FOLVITE) 1 MG tablet Take 1 tablet (1 mg total) by mouth daily. (Patient not taking: Reported on 04/04/2021) 30 tablet 0   oxyCODONE-acetaminophen (PERCOCET/ROXICET) 5-325 MG tablet Take 1 tablet by mouth every 8 (eight) hours as needed for severe pain. (Patient not taking: No sig reported) 10 tablet 0   colchicine 0.6 MG tablet take 1 tab by mouth twice daily 60 tablet 1   diclofenac Sodium (VOLTAREN) 1 % GEL Apply 2 g topically 4 (four) times daily as needed (musculoskeletal or joint pain). 50 g 0   No facility-administered medications prior to visit.    Allergies  Allergen Reactions   Nsaids Other (See Comments)    Likely causing AKI    Review of Systems  Constitutional:  Negative for chills, fever and malaise/fatigue.  Respiratory:  Negative for cough and shortness of breath.   Cardiovascular:  Negative for chest pain, palpitations and leg swelling.  Gastrointestinal: Negative.  Negative  for abdominal pain, blood in stool, constipation, diarrhea, nausea and vomiting.  Musculoskeletal:  Positive for joint pain.       See HPI  Skin: Negative.   Neurological: Negative.   Psychiatric/Behavioral:  Negative for depression. The patient is not nervous/anxious.   All other systems reviewed and are negative.     Objective:    Physical Exam Constitutional:      General: She is not in acute distress.    Appearance: Normal appearance.     Comments: Patient in no acute distress, using wheelchair to assist with moblitity  HENT:     Head: Normocephalic.  Cardiovascular:     Rate and Rhythm:  Normal rate and regular rhythm.     Pulses: Normal pulses.     Heart sounds: Normal heart sounds.     Comments: No obvious peripheral edema Pulmonary:     Effort: Pulmonary effort is normal.     Breath sounds: Normal breath sounds.  Abdominal:     General: Abdomen is flat. Bowel sounds are normal.     Palpations: Abdomen is soft.     Comments: Guaiac positive. Rectal exam negative for gross blood or lesions. Fecal matter [place].  Musculoskeletal:        General: Swelling present. No tenderness.     Cervical back: Normal range of motion and neck supple.     Right lower leg: Edema present.     Left lower leg: Edema present.     Comments: +2 swelling in the bilateral feet. Moderate swelling to the left elbow.  Skin:    General: Skin is warm and dry.     Capillary Refill: Capillary refill takes less than 2 seconds.  Neurological:     General: No focal deficit present.     Mental Status: She is alert and oriented to person, place, and time.  Psychiatric:        Mood and Affect: Mood normal.        Behavior: Behavior normal.        Thought Content: Thought content normal.        Judgment: Judgment normal.    BP 117/72   Pulse 83   Temp 98.1 F (36.7 C)   Ht 5\' 4"  (1.626 m)   Wt 179 lb 6.4 oz (81.4 kg)   SpO2 100%   BMI 30.79 kg/m  Wt Readings from Last 3 Encounters:  04/04/21  179 lb 6.4 oz (81.4 kg)  03/28/21 200 lb (90.7 kg)  03/10/21 202 lb 13.2 oz (92 kg)     There is no immunization history on file for this patient.  Diabetic Foot Exam - Simple   No data filed     Lab Results  Component Value Date   TSH 0.658 11/15/2019   Lab Results  Component Value Date   WBC 19.9 (H) 04/04/2021   HGB 9.1 (L) 04/04/2021   HCT 28.7 (L) 04/04/2021   MCV 77.4 (L) 04/04/2021   PLT 429 (H) 04/04/2021   Lab Results  Component Value Date   NA 139 04/04/2021   K 4.0 04/04/2021   CO2 24 04/04/2021   GLUCOSE 152 (H) 04/04/2021   BUN 30 (H) 04/04/2021   CREATININE 0.97 04/04/2021   BILITOT 0.4 04/04/2021   ALKPHOS 75 04/04/2021   AST 13 (L) 04/04/2021   ALT 24 04/04/2021   PROT 6.3 (L) 04/04/2021   ALBUMIN 2.5 (L) 04/04/2021   CALCIUM 8.7 (L) 04/04/2021   ANIONGAP 9 04/04/2021   Lab Results  Component Value Date   CHOL 117 04/04/2021   CHOL 163 11/15/2019   CHOL 162 07/18/2017   Lab Results  Component Value Date   HDL 38 (L) 04/04/2021   HDL 80 11/15/2019   HDL 48 07/18/2017   Lab Results  Component Value Date   LDLCALC 67 04/04/2021   LDLCALC 67 11/15/2019   LDLCALC 98 07/18/2017   Lab Results  Component Value Date   TRIG 61 04/04/2021   TRIG 84 11/15/2019   TRIG 80 07/18/2017   Lab Results  Component Value Date   CHOLHDL 3.1 04/04/2021   CHOLHDL 2.0 11/15/2019   CHOLHDL 3.4 07/18/2017   Lab  Results  Component Value Date   HGBA1C 5.0 11/15/2019   HGBA1C 5.0 11/15/2019   HGBA1C 5.0 (A) 11/15/2019   HGBA1C 5.0 11/15/2019       Assessment & Plan:   Problem List Items Addressed This Visit   Per chart review, has had a steady decline in H&H over the past month. Last completed results was a H&H of 8 and 25.3. Based on patient HPI and PE, concerned for GI bleed. Will order stat labs in clinic today, patient to wait in the day clinic for results, due to concern for worsening anemia. Reassuring that patient/ family denies any change  sin mentation of fatigue.   1200 Patient departed clinic prior to completion of results without notifying staff. Will attempt to contact patient with results and any changes in current treatment plan.   1232 Lab results wnl, however; maintain concerned for GI bleed. Will refer to GI for further evaluation and management. Colonoscopy may be warranted. Patient to be contact by support staff with follow up recommendations and lab results.    Cardiovascular and Mediastinum   Essential hypertension (Chronic)   Relevant Orders   Lipid panel Patient informed to continue taking medications as prescribed  Encouraged continued sobriety and abstaining from NSAID usage    Other Visit Diagnoses     Iron deficiency anemia, unspecified iron deficiency anemia type    -  Primary   Relevant Orders   CBC with Differential/Platelet   Comprehensive metabolic panel   Iron, TIBC and Ferritin Panel   Folate Referral sent to GI  Gout Patient informed to maintain follow up with orthopedics    Follow up in clinic in 1 mth for reevaluation of symptoms, sooner as needed    I am having [employer] maintain her albuterol, amLODipine, oxyCODONE-acetaminophen, acetaminophen, allopurinol, predniSONE, colchicine, folic acid, cyanocobalamin, diclofenac Sodium, folic acid, vitamin B-12, and ferrous sulfate.  No orders of the defined types were placed in this encounter.    Kathrynn Speed, NP

## 2021-04-06 ENCOUNTER — Other Ambulatory Visit: Payer: Self-pay

## 2021-04-06 MED FILL — Albuterol Sulfate Inhal Aero 108 MCG/ACT (90MCG Base Equiv): RESPIRATORY_TRACT | 25 days supply | Qty: 8.5 | Fill #0 | Status: AC

## 2021-04-07 LAB — METHYLMALONIC ACID, SERUM: Methylmalonic Acid, Quantitative: 225 nmol/L (ref 0–378)

## 2021-04-10 ENCOUNTER — Other Ambulatory Visit: Payer: Self-pay

## 2021-04-13 LAB — COMPREHENSIVE METABOLIC PANEL

## 2021-04-13 LAB — CBC WITH DIFFERENTIAL/PLATELET

## 2021-04-13 LAB — LIPID PANEL

## 2021-04-13 LAB — FOLATE

## 2021-04-17 ENCOUNTER — Other Ambulatory Visit: Payer: Self-pay

## 2021-04-17 MED FILL — Amlodipine Besylate Tab 10 MG (Base Equivalent): ORAL | 30 days supply | Qty: 30 | Fill #3 | Status: AC

## 2021-04-18 ENCOUNTER — Other Ambulatory Visit: Payer: Self-pay

## 2021-04-19 ENCOUNTER — Ambulatory Visit: Payer: Self-pay | Admitting: Internal Medicine

## 2021-04-24 ENCOUNTER — Other Ambulatory Visit: Payer: Self-pay

## 2021-04-24 ENCOUNTER — Encounter: Payer: Self-pay | Admitting: Nurse Practitioner

## 2021-04-24 ENCOUNTER — Ambulatory Visit (INDEPENDENT_AMBULATORY_CARE_PROVIDER_SITE_OTHER): Payer: Self-pay | Admitting: Nurse Practitioner

## 2021-04-24 VITALS — BP 129/85 | HR 81 | Temp 97.3°F | Ht 64.0 in | Wt 195.0 lb

## 2021-04-24 DIAGNOSIS — D509 Iron deficiency anemia, unspecified: Secondary | ICD-10-CM

## 2021-04-24 NOTE — Progress Notes (Signed)
[facility] Patient [facility] [place] [address] [place], [place]  [address] Phone:  [phone]   Fax:  [phone] Subjective:   Patient ID: Suzanne Graham, female    DOB: Jul 16, 1980, 40 y.o.   MRN: [id number]  Chief Complaint  Patient presents with   Follow-up    Swollen feet, hands, elbow. Ongoing since hospital admission. Requesting lab results.    HPI Suzanne Graham 40 y.o. female with history of  has a past medical history of Acid reflux (08/2019), Asthma, Depression, Gout, Hypertension, and Stroke (HCC). To the [facility] for reevaluation of anemia and gout pain/ swelling. Patient states that gout pain and swelling remains unchanged since being discharged from hospital. Symptoms predominantly located in the bilateral feet, hands and elbow and most pronounced in the bilateral knees and feet. Has been taking tylenol for pain with  mild to moderate improvement. Pain increases with weightbearing. Uses a wheelchair and/ walker to assist with ambulation at home. Also receives assistance from mother at home. Currently compliant with all medications, including iron supplements.   Patient has pending follow up with pain management, awaiting approval from medicaid and disability. Has not ingested alcohol since most recent hospitalization. Smoke cigarettes, typically 8-10/ day. Not ready to quit smoking at this time. Denies any other complaints today. Denies any fever. Denies any fatigue, chest pain, shortness of breath, HA or dizziness. Denies any blurred vision, numbness or tingling.  Past Medical History:  Diagnosis Date   Acid reflux 08/2019   Asthma    Depression    Gout    Hypertension    Stroke [facility])     Past Surgical History:  Procedure Laterality Date   LOOP RECORDER INSERTION N/A 08/19/2017   Procedure: LOOP RECORDER INSERTION;  Surgeon: Hillis Range, MD;  Location: [facility] INVASIVE CV LAB;  Service: Cardiovascular;  Laterality: N/A;   TEE WITHOUT CARDIOVERSION N/A 08/19/2017    Procedure: TRANSESOPHAGEAL ECHOCARDIOGRAM (TEE);  Surgeon: Pricilla Riffle, MD;  Location: [facility] ENDOSCOPY;  Service: Cardiovascular;  Laterality: N/A;    Family History  Problem Relation Age of Onset   Heart failure Mother    Hypertension Mother    Stroke Neg Hx     Social History   Socioeconomic History   Marital status: Single    Spouse name: Not on file   Number of children: Not on file   Years of education: Not on file   Highest education level: Not on file  Occupational History   Not on file  Tobacco Use   Smoking status: Every Day    Packs/day: 0.25    Types: Cigarettes   Smokeless tobacco: Never  Vaping Use   Vaping Use: Never used  Substance and Sexual Activity   Alcohol use: Not Currently    Comment: rare   Drug use: No   Sexual activity: Not Currently  Other Topics Concern   Not on file  Social History Narrative   Not on file   Social Determinants of Health   Financial Resource Strain: Not on file  Food Insecurity: Not on file  Transportation Needs: Not on file  Physical Activity: Not on file  Stress: Not on file  Social Connections: Not on file  Intimate Partner Violence: Not on file    Outpatient Medications Prior to Visit  Medication Sig Dispense Refill   acetaminophen (TYLENOL) 500 MG tablet Take 500 mg by mouth every 6 (six) hours as needed for moderate pain or headache.     albuterol (VENTOLIN HFA)  108 (90 Base) MCG/ACT inhaler INHALE 2 PUFFS INTO THE LUNGS EVERY 4 (FOUR) HOURS AS NEEDED FOR WHEEZING OR SHORTNESS OF BREATH. 8.5 g 11   amLODipine (NORVASC) 10 MG tablet TAKE 1 TABLET BY MOUTH DAILY (Patient taking differently: Take 10 mg by mouth daily.) 30 tablet 6   colchicine 0.6 MG tablet Take 1 tablet (0.6 mg total) by mouth 2 (two) times daily. 60 tablet 0   diclofenac Sodium (VOLTAREN) 1 % GEL Apply 2 grams to affected area four times daily as needed (for musculoskeletal or joint pain) 100 g 1   ferrous sulfate 325 (65 FE) MG tablet Take 1 tab  by mouth daily with breakfast 30 tablet 1   folic acid (FOLVITE) 1 MG tablet take 1 tab by mouth daily 30 tablet 1   folic acid (FOLVITE) 1 MG tablet Take 1 tablet (1 mg total) by mouth daily. 30 tablet 0   allopurinol (ZYLOPRIM) 100 MG tablet Take 1 tablet (100 mg total) by mouth daily. (Patient not taking: Reported on 04/04/2021) 30 tablet 0   oxyCODONE-acetaminophen (PERCOCET/ROXICET) 5-325 MG tablet Take 1 tablet by mouth every 8 (eight) hours as needed for severe pain. (Patient not taking: Reported on 04/24/2021) 10 tablet 0   vitamin B-12 (CYANOCOBALAMIN) 1000 MCG tablet take 1 tab by mouth daily (Patient not taking: Reported on 04/24/2021) 30 tablet 1   cyanocobalamin 1000 MCG tablet Take 1 tablet (1,000 mcg total) by mouth daily. (Patient not taking: No sig reported) 30 tablet 0   No facility-administered medications prior to visit.    Allergies  Allergen Reactions   Nsaids Other (See Comments)    Likely causing AKI    Review of Systems  Constitutional:  Negative for chills, fever and malaise/fatigue.  Respiratory:  Negative for cough and shortness of breath.   Cardiovascular:  Negative for chest pain, palpitations and leg swelling.  Gastrointestinal:  Negative for abdominal pain, blood in stool, constipation, diarrhea, nausea and vomiting.  Musculoskeletal:  Positive for joint pain.       See HPI  Skin: Negative.   Neurological: Negative.   Psychiatric/Behavioral:  Negative for depression. The patient is not nervous/anxious.   All other systems reviewed and are negative.     Objective:    Physical Exam Vitals reviewed.  Constitutional:      General: She is not in acute distress.    Appearance: Normal appearance.     Comments: Patient in wheelchair  HENT:     Head: Normocephalic.  Cardiovascular:     Rate and Rhythm: Normal rate and regular rhythm.     Pulses: Normal pulses.     Heart sounds: Normal heart sounds.     Comments: No obvious peripheral edema Pulmonary:      Effort: Pulmonary effort is normal.     Breath sounds: Normal breath sounds.  Musculoskeletal:        General: Swelling present. No tenderness or signs of injury.     Cervical back: Normal range of motion and neck supple.     Comments: Limited ROM in the BLE due to pain. Mild swelling in right foot and moderate swelling in the left foot. Mild swelling in bilateral knees  Skin:    General: Skin is warm and dry.     Capillary Refill: Capillary refill takes less than 2 seconds.  Neurological:     General: No focal deficit present.     Mental Status: She is alert and oriented to person, place, and time.  Psychiatric:        Mood and Affect: Mood normal.        Behavior: Behavior normal.        Thought Content: Thought content normal.        Judgment: Judgment normal.    BP 129/85 (BP Location: Right Arm, Patient Position: Sitting)   Pulse 81   Temp (!) 97.3 F (36.3 C)   Ht 5\' 4"  (1.626 m)   Wt 195 lb 0.2 oz (88.5 kg)   LMP 03/28/2021   SpO2 100%   BMI 33.47 kg/m  Wt Readings from Last 3 Encounters:  04/24/21 195 lb 0.2 oz (88.5 kg)  04/04/21 179 lb 6.4 oz (81.4 kg)  03/28/21 200 lb (90.7 kg)     There is no immunization history on file for this patient.  Diabetic Foot Exam - Simple   No data filed     Lab Results  Component Value Date   TSH 0.658 11/15/2019   Lab Results  Component Value Date   WBC 19.9 (H) 04/04/2021   HGB 9.1 (L) 04/04/2021   HCT 28.7 (L) 04/04/2021   MCV 77.4 (L) 04/04/2021   PLT 429 (H) 04/04/2021   Lab Results  Component Value Date   NA 139 04/04/2021   K 4.0 04/04/2021   CO2 24 04/04/2021   GLUCOSE 152 (H) 04/04/2021   BUN 30 (H) 04/04/2021   CREATININE 0.97 04/04/2021   BILITOT 0.4 04/04/2021   ALKPHOS 75 04/04/2021   AST 13 (L) 04/04/2021   ALT 24 04/04/2021   PROT 6.3 (L) 04/04/2021   ALBUMIN 2.5 (L) 04/04/2021   CALCIUM 8.7 (L) 04/04/2021   ANIONGAP 9 04/04/2021   Lab Results  Component Value Date   CHOL 117  04/04/2021   CHOL CANCELED 04/04/2021   CHOL 163 11/15/2019   Lab Results  Component Value Date   HDL 38 (L) 04/04/2021   HDL CANCELED 04/04/2021   HDL 80 11/15/2019   Lab Results  Component Value Date   LDLCALC 67 04/04/2021   LDLCALC 67 11/15/2019   LDLCALC 98 07/18/2017   Lab Results  Component Value Date   TRIG 61 04/04/2021   TRIG CANCELED 04/04/2021   TRIG 84 11/15/2019   Lab Results  Component Value Date   CHOLHDL 3.1 04/04/2021   CHOLHDL 2.0 11/15/2019   CHOLHDL 3.4 07/18/2017   Lab Results  Component Value Date   HGBA1C 5.0 11/15/2019   HGBA1C 5.0 11/15/2019   HGBA1C 5.0 (A) 11/15/2019   HGBA1C 5.0 11/15/2019       Assessment & Plan:   Problem List Items Addressed This Visit   None Visit Diagnoses     Iron deficiency anemia, unspecified iron deficiency anemia type    -  Primary   Relevant Orders   CBC with Differential/Platelet   Comprehensive metabolic panel   Iron, TIBC and Ferritin Panel Encouraged continued medication compliance    - Informed patient to maintain upcoming follow up with pain management for further evaluation and management of chronic gout pain  - Informed to continue taking tylenol as needed for pain    Follow up in 3 mths for reevaluation of chronic pain and anemia, sooner as needed    I have discontinued Suzanne Graham's cyanocobalamin. I am also having her maintain her albuterol, amLODipine, oxyCODONE-acetaminophen, acetaminophen, allopurinol, colchicine, diclofenac Sodium, folic acid, vitamin B-12, and ferrous sulfate.  No orders of the defined types were placed in this encounter.    01/15/2020 I  Fleeta Emmer, NP

## 2021-04-24 NOTE — Patient Instructions (Signed)
You were seen today in the [facility] for reevaluation of chronic pain and anemia. Labs were collected, results will be available via MyChart or, if abnormal, you will be contacted by clinic staff. You were prescribed medications, please take as directed. Please follow up in 3 mths for reevaluation of symptoms.

## 2021-04-25 LAB — CBC WITH DIFFERENTIAL/PLATELET
Basophils Absolute: 0 10*3/uL (ref 0.0–0.2)
Basos: 1 %
EOS (ABSOLUTE): 0.1 10*3/uL (ref 0.0–0.4)
Eos: 2 %
Hematocrit: 30.1 % — ABNORMAL LOW (ref 34.0–46.6)
Hemoglobin: 9.2 g/dL — ABNORMAL LOW (ref 11.1–15.9)
Immature Grans (Abs): 0 10*3/uL (ref 0.0–0.1)
Immature Granulocytes: 1 %
Lymphocytes Absolute: 1.6 10*3/uL (ref 0.7–3.1)
Lymphs: 27 %
MCH: 24.1 pg — ABNORMAL LOW (ref 26.6–33.0)
MCHC: 30.6 g/dL — ABNORMAL LOW (ref 31.5–35.7)
MCV: 79 fL (ref 79–97)
Monocytes Absolute: 0.5 10*3/uL (ref 0.1–0.9)
Monocytes: 9 %
Neutrophils Absolute: 3.8 10*3/uL (ref 1.4–7.0)
Neutrophils: 60 %
Platelets: 239 10*3/uL (ref 150–450)
RBC: 3.82 x10E6/uL (ref 3.77–5.28)
RDW: 16.1 % — ABNORMAL HIGH (ref 11.7–15.4)
WBC: 6.1 10*3/uL (ref 3.4–10.8)

## 2021-04-25 LAB — COMPREHENSIVE METABOLIC PANEL
ALT: 14 IU/L (ref 0–32)
AST: 11 IU/L (ref 0–40)
Albumin/Globulin Ratio: 1.6 (ref 1.2–2.2)
Albumin: 3.6 g/dL — ABNORMAL LOW (ref 3.8–4.8)
Alkaline Phosphatase: 60 IU/L (ref 44–121)
BUN/Creatinine Ratio: 10 (ref 9–23)
BUN: 14 mg/dL (ref 6–24)
Bilirubin Total: 0.3 mg/dL (ref 0.0–1.2)
CO2: 23 mmol/L (ref 20–29)
Calcium: 9.8 mg/dL (ref 8.7–10.2)
Chloride: 103 mmol/L (ref 96–106)
Creatinine, Ser: 1.34 mg/dL — ABNORMAL HIGH (ref 0.57–1.00)
Globulin, Total: 2.3 g/dL (ref 1.5–4.5)
Glucose: 71 mg/dL (ref 70–99)
Potassium: 4.8 mmol/L (ref 3.5–5.2)
Sodium: 141 mmol/L (ref 134–144)
Total Protein: 5.9 g/dL — ABNORMAL LOW (ref 6.0–8.5)
eGFR: 51 mL/min/{1.73_m2} — ABNORMAL LOW (ref >59)

## 2021-04-25 LAB — IRON,TIBC AND FERRITIN PANEL
Ferritin: 105 ng/mL (ref 15–150)
Iron Saturation: 28 % (ref 15–55)
Iron: 76 ug/dL (ref 27–159)
Total Iron Binding Capacity: 268 ug/dL (ref 250–450)
UIBC: 192 ug/dL (ref 131–425)

## 2021-05-03 ENCOUNTER — Telehealth: Payer: Self-pay

## 2021-05-03 NOTE — Telephone Encounter (Signed)
Pt is asking for pain medicine Hydrocodone    [facility] on [address]

## 2021-05-04 ENCOUNTER — Emergency Department (HOSPITAL_COMMUNITY)
Admission: EM | Admit: 2021-05-04 | Discharge: 2021-05-04 | Disposition: A | Discharge status: Home / Self Care | Payer: Medicaid Other | Attending: Emergency Medicine | Admitting: Emergency Medicine

## 2021-05-04 ENCOUNTER — Encounter (HOSPITAL_COMMUNITY): Payer: Self-pay | Admitting: Emergency Medicine

## 2021-05-04 ENCOUNTER — Emergency Department (HOSPITAL_COMMUNITY): Payer: Medicaid Other

## 2021-05-04 DIAGNOSIS — M25571 Pain in right ankle and joints of right foot: Secondary | ICD-10-CM | POA: Diagnosis not present

## 2021-05-04 DIAGNOSIS — Z5321 Procedure and treatment not carried out due to patient leaving prior to being seen by health care provider: Secondary | ICD-10-CM | POA: Diagnosis not present

## 2021-05-04 DIAGNOSIS — M25561 Pain in right knee: Secondary | ICD-10-CM | POA: Diagnosis not present

## 2021-05-04 NOTE — ED Triage Notes (Signed)
Patient here with complaint of ongoing right ankle and right knee pain that started several months ago but got worse approximately one month ago. Patient seen by another provider and told she can take ibuprofen and acetaminophen but states those have been ineffective in controlling her pain so she is here requesting hydrocodone or something similar. Patient alert, oriented, and in no apparent distress at this time.

## 2021-05-04 NOTE — ED Notes (Signed)
Pt saying she is leaving and will come back tomorrow.

## 2021-05-09 ENCOUNTER — Emergency Department (HOSPITAL_COMMUNITY): Payer: Medicaid Other

## 2021-05-09 ENCOUNTER — Encounter (HOSPITAL_COMMUNITY): Payer: Self-pay | Admitting: Emergency Medicine

## 2021-05-09 ENCOUNTER — Emergency Department (HOSPITAL_COMMUNITY)
Admission: EM | Admit: 2021-05-09 | Discharge: 2021-05-09 | Disposition: A | Discharge status: Home / Self Care | Payer: Medicaid Other | Attending: Emergency Medicine | Admitting: Emergency Medicine

## 2021-05-09 DIAGNOSIS — Z79899 Other long term (current) drug therapy: Secondary | ICD-10-CM | POA: Insufficient documentation

## 2021-05-09 DIAGNOSIS — M25531 Pain in right wrist: Secondary | ICD-10-CM | POA: Diagnosis not present

## 2021-05-09 DIAGNOSIS — J45909 Unspecified asthma, uncomplicated: Secondary | ICD-10-CM | POA: Insufficient documentation

## 2021-05-09 DIAGNOSIS — M10071 Idiopathic gout, right ankle and foot: Secondary | ICD-10-CM | POA: Insufficient documentation

## 2021-05-09 DIAGNOSIS — M10471 Other secondary gout, right ankle and foot: Secondary | ICD-10-CM

## 2021-05-09 DIAGNOSIS — I129 Hypertensive chronic kidney disease with stage 1 through stage 4 chronic kidney disease, or unspecified chronic kidney disease: Secondary | ICD-10-CM | POA: Diagnosis not present

## 2021-05-09 DIAGNOSIS — F17210 Nicotine dependence, cigarettes, uncomplicated: Secondary | ICD-10-CM | POA: Insufficient documentation

## 2021-05-09 DIAGNOSIS — R202 Paresthesia of skin: Secondary | ICD-10-CM | POA: Insufficient documentation

## 2021-05-09 DIAGNOSIS — M79671 Pain in right foot: Secondary | ICD-10-CM | POA: Diagnosis present

## 2021-05-09 DIAGNOSIS — N183 Chronic kidney disease, stage 3 unspecified: Secondary | ICD-10-CM | POA: Insufficient documentation

## 2021-05-09 MED ORDER — HYDROCODONE-ACETAMINOPHEN 5-325 MG PO TABS
1.0000 | ORAL_TABLET | Freq: Once | ORAL | Status: AC
Start: 2021-05-09 — End: 2021-05-09
  Administered 2021-05-09: 1 via ORAL
  Filled 2021-05-09: qty 1

## 2021-05-09 MED ORDER — PREDNISONE 10 MG PO TABS: 40.0000 mg | ORAL_TABLET | Freq: Every day | ORAL | 0 refills | Status: DC

## 2021-05-09 MED ORDER — HYDROCODONE-ACETAMINOPHEN 5-325 MG PO TABS: 1.0000 | ORAL_TABLET | ORAL | 0 refills | Status: AC | PRN

## 2021-05-09 NOTE — ED Notes (Signed)
Pt verbalized understanding of d/c instructions, meds and followup care. Denies questions. VSS, no distress noted. Steady gait to exit with all belongings.  ?

## 2021-05-09 NOTE — ED Provider Notes (Signed)
[facility]   CSN: [id number] Arrival date & time: 05/09/21  0850     History Chief Complaint  Patient presents with   Hand Pain    Suzanne Graham is a 40 y.o. female with a past medical history of gout and hypertension presenting today with a complaint of a gout flareup.  She reports that her right wrist has been painful and inflamed for the past 2 days.  No numbness or tingling.  Takes daily medications for her gout, however she is unable to remember the name of these medications.  Per chart review it appears she is on allopurinol.  Her new insurance just kicked in and she has the intention of following up with a rheumatologist.   Hand Pain      Past Medical History:  Diagnosis Date   Acid reflux 08/2019   Asthma    Depression    Gout    Hypertension    Stroke [facility](HCC)     Patient Active Problem List   Diagnosis Date Noted   Systemic inflammatory response syndrome (HCC) 03/31/2021   Inflammatory arthritis 03/29/2021   CKD (chronic kidney disease), stage III (HCC) 03/29/2021   Microcytic anemia 03/29/2021   Gout 03/11/2021   AKI (acute kidney injury) (HCC) 03/11/2021   Adverse reaction to non-steroidal anti-inflammatory drug (NSAID), initial encounter 03/11/2021   Vitamin D deficiency 11/17/2019   Chronic gout of foot 10/11/2018   Current smoker 10/11/2018   Generalized pain 10/11/2018   Chronic nonintractable headache 08/06/2017   Essential hypertension 08/06/2017   Stroke (HCC) 07/17/2017    Past Surgical History:  Procedure Laterality Date   LOOP RECORDER INSERTION N/A 08/19/2017   Procedure: LOOP RECORDER INSERTION;  Surgeon: Hillis RangeAllred, James, MD;  Location: [facility] INVASIVE CV LAB;  Service: Cardiovascular;  Laterality: N/A;   TEE WITHOUT CARDIOVERSION N/A 08/19/2017   Procedure: TRANSESOPHAGEAL ECHOCARDIOGRAM (TEE);  Surgeon: Pricilla Riffleoss, Paula V, MD;  [facility];  Service: Cardiovascular;  Laterality: N/A;      OB History     Gravida  0   Para  0   Term  0   Preterm  0   AB  0   Living  0      SAB  0   IAB  0   Ectopic  0   Multiple  0   Live Births  0           Family History  Problem Relation Age of Onset   Heart failure Mother    Hypertension Mother    Stroke Neg Hx     Social History   Tobacco Use   Smoking status: Every Day    Packs/day: 0.25    Types: Cigarettes   Smokeless tobacco: Never  Vaping Use   Vaping Use: Never used  Substance Use Topics   Alcohol use: Not Currently    Comment: rare   Drug use: No    Home Medications Prior to Admission medications   Medication Sig Start Date End Date Taking? Authorizing Provider  HYDROcodone-acetaminophen (NORCO/VICODIN) 5-325 MG tablet Take 1 tablet by mouth every 4 (four) hours as needed for up to 3 days. 05/09/21 05/12/21 Yes Areta Terwilliger A, PA-C  predniSONE (DELTASONE) 10 MG tablet Take 4 tablets (40 mg total) by mouth daily. 05/09/21  Yes Dmiya Malphrus A, PA-C  acetaminophen (TYLENOL) 500 MG tablet Take 500 mg by mouth every 6 (six) hours as needed for moderate pain or headache.  [provider]  albuterol (VENTOLIN HFA) 108 (90 Base) MCG/ACT inhaler INHALE 2 PUFFS INTO THE LUNGS EVERY 4 (FOUR) HOURS AS NEEDED FOR WHEEZING OR SHORTNESS OF BREATH. 06/15/20   Kallie Locks, FNP  allopurinol (ZYLOPRIM) 100 MG tablet Take 1 tablet (100 mg total) by mouth daily. Patient not taking: Reported on 04/04/2021 03/14/21 04/13/21  Hughie Closs, MD  amLODipine (NORVASC) 10 MG tablet TAKE 1 TABLET BY MOUTH DAILY Patient taking differently: Take 10 mg by mouth daily. 05/05/20 05/18/21  Kallie Locks, FNP  colchicine 0.6 MG tablet Take 1 tablet (0.6 mg total) by mouth 2 (two) times daily. 03/31/21 04/30/21  Zigmund Daniel., MD  diclofenac Sodium (VOLTAREN) 1 % GEL Apply 2 grams to affected area four times daily as needed (for musculoskeletal or joint pain) 03/31/21   Zigmund Daniel.,  MD  ferrous sulfate 325 (65 FE) MG tablet Take 1 tab by mouth daily with breakfast 03/31/21   Zigmund Daniel., MD  folic acid (FOLVITE) 1 MG tablet take 1 tab by mouth daily 03/31/21   Zigmund Daniel., MD  oxyCODONE-acetaminophen (PERCOCET/ROXICET) 5-325 MG tablet Take 1 tablet by mouth every 8 (eight) hours as needed for severe pain. Patient not taking: Reported on 04/24/2021 03/05/21   Wynetta Fines, MD  vitamin B-12 (CYANOCOBALAMIN) 1000 MCG tablet take 1 tab by mouth daily Patient not taking: Reported on 04/24/2021 03/31/21   Zigmund Daniel., MD    Allergies    Nsaids  Review of Systems   Review of Systems  Constitutional:  Negative for chills and fever.  Skin:  Negative for wound.  All other systems reviewed and are negative.  Physical Exam Updated Vital Signs BP (!) 149/100 (BP Location: Left Arm)   Pulse 86   Temp 98.5 F (36.9 C)   Resp 18   SpO2 100%   Physical Exam Vitals and nursing note reviewed.  Constitutional:      Appearance: Normal appearance.  HENT:     Head: Normocephalic and atraumatic.  Eyes:     General: No scleral icterus.    Conjunctiva/sclera: Conjunctivae normal.  Pulmonary:     Effort: Pulmonary effort is normal. No respiratory distress.  Musculoskeletal:        General: Swelling and tenderness present.     Comments: Erythema, tenderness and inflammation noted to the right wrist and thumb.  Strong pulses.  Skin:    Findings: No rash.  Neurological:     Mental Status: She is alert.  Psychiatric:        Mood and Affect: Mood normal.    ED Results / Procedures / Treatments   Labs (all labs ordered are listed, but only abnormal results are displayed) Labs Reviewed - No data to display  EKG None  Radiology DG Hand Complete Right  Result Date: 05/09/2021 CLINICAL DATA:  Pain and swelling EXAM: RIGHT HAND - COMPLETE 3+ VIEW COMPARISON:  None. FINDINGS: No fracture or dislocation is seen. There are no abnormal soft  tissue calcifications. There is soft tissue swelling over the dorsal aspect of metacarpophalangeal joints in the lateral view. No definite erosive changes are noted in bony structures. No significant bony spurs noted. IMPRESSION: No fracture or dislocation is seen. There is no evidence of significant osteoarthritis or abnormal soft tissue calcifications. Electronically Signed   By: Ernie Avena M.D.   On: 05/09/2021 09:46    Procedures Procedures   Medications Ordered in ED Medications  HYDROcodone-acetaminophen (NORCO/VICODIN) 5-325 MG per tablet 1 tablet (1 tablet Oral Given 05/09/21 1514)    ED Course  I have reviewed the triage vital signs and the nursing notes.  Pertinent labs & imaging results that were available during my care of the patient were reviewed by me and considered in my medical decision making (see chart for details).    MDM Rules/Calculators/A&P 40 year old female with a known history of gout presenting with a gout flare.  This has been going on for few days.  She reports that when this happens she follows with her rheumatologist and takes hydrocodone.  She has new insurance that now will allow her to closely follow with her rheumatologist and primary care provider.  No sign of septic joint.  No fevers or chills.  I believe she is stable for discharge at this time with Vicodin for pain control as well as prednisone for the next 10 days.  She is agreeable to this plan.  Ambulatory and stable for discharge with close follow-up.  Final Clinical Impression(s) / ED Diagnoses Final diagnoses:  Gout due to other secondary cause involving toe of right foot, unspecified chronicity    Rx / DC Orders ED Discharge Orders          Ordered    predniSONE (DELTASONE) 10 MG tablet  Daily        05/09/21 1523    HYDROcodone-acetaminophen (NORCO/VICODIN) 5-325 MG tablet  Every 4 hours PRN        05/09/21 1523             Aleea Hendry A, PA-C 05/09/21 1620     Pricilla Loveless, MD 05/09/21 [phone]

## 2021-05-09 NOTE — Discharge Instructions (Addendum)
I am sending two medications to your pharmacy.  The first of which is an opioid pain medication.  It is important that you do not drive on this medication, and keep it out of the reach of others as it is a controlled substance.  The second medication is called prednisone.  This is a steroid and should help you fight through this flare of your gout.  Please continue to take your daily medications and follow-up with your rheumatologist.  It was a pleasure to meet you and I hope that you feel better. 

## 2021-05-09 NOTE — ED Triage Notes (Signed)
Patient here for evaluation of swelling and pain in right hand that started yesterday morning, history of gout and rheumatoid arthritis. Patient reports states she takes colchicine daily. Patient alert, oriented, and in no apparent distress at this time.

## 2021-05-10 ENCOUNTER — Other Ambulatory Visit: Payer: Self-pay

## 2021-05-15 ENCOUNTER — Telehealth: Payer: Self-pay

## 2021-05-15 ENCOUNTER — Other Ambulatory Visit: Payer: Self-pay

## 2021-05-15 NOTE — Telephone Encounter (Signed)
Pt asked to be referral to hematologist

## 2021-05-15 NOTE — Telephone Encounter (Signed)
Patient was requesting referral to rheumatologist. Patient had an initial appointment on 10/13. Advised patient to call back to reschedule. Verbally agreed.

## 2021-05-17 ENCOUNTER — Other Ambulatory Visit: Payer: Self-pay

## 2021-05-23 ENCOUNTER — Other Ambulatory Visit: Payer: Self-pay

## 2021-05-23 ENCOUNTER — Other Ambulatory Visit: Payer: Self-pay | Admitting: Nurse Practitioner

## 2021-05-23 DIAGNOSIS — I10 Essential (primary) hypertension: Secondary | ICD-10-CM

## 2021-05-23 MED ORDER — AMLODIPINE BESYLATE 10 MG PO TABS
ORAL_TABLET | Freq: Every day | ORAL | 6 refills | Status: DC
  Filled 2021-05-23: qty 30, 30d supply, fill #0
  Filled 2021-07-03: qty 90, 90d supply, fill #1
  Filled 2021-10-03: qty 90, 90d supply, fill #0

## 2021-05-24 ENCOUNTER — Other Ambulatory Visit (HOSPITAL_BASED_OUTPATIENT_CLINIC_OR_DEPARTMENT_OTHER): Payer: Self-pay

## 2021-05-25 ENCOUNTER — Other Ambulatory Visit: Payer: Self-pay

## 2021-05-28 ENCOUNTER — Other Ambulatory Visit: Payer: Self-pay | Admitting: Nurse Practitioner

## 2021-05-28 ENCOUNTER — Other Ambulatory Visit: Payer: Self-pay

## 2021-05-28 DIAGNOSIS — R0602 Shortness of breath: Secondary | ICD-10-CM

## 2021-05-28 DIAGNOSIS — F17200 Nicotine dependence, unspecified, uncomplicated: Secondary | ICD-10-CM

## 2021-05-28 MED ORDER — ALBUTEROL SULFATE HFA 108 (90 BASE) MCG/ACT IN AERS
2.0000 | INHALATION_SPRAY | RESPIRATORY_TRACT | 11 refills | Status: DC | PRN
  Filled 2021-05-28: qty 8.5, 25d supply, fill #0

## 2021-05-28 MED ORDER — ALBUTEROL SULFATE HFA 108 (90 BASE) MCG/ACT IN AERS: 2.0000 | INHALATION_SPRAY | Freq: Four times a day (QID) | RESPIRATORY_TRACT | 2 refills | Status: DC | PRN

## 2021-05-28 MED FILL — Albuterol Sulfate Inhal Aero 108 MCG/ACT (90MCG Base Equiv): RESPIRATORY_TRACT | 25 days supply | Qty: 8.5 | Fill #1 | Status: CN

## 2021-06-04 ENCOUNTER — Telehealth: Payer: Self-pay

## 2021-06-04 ENCOUNTER — Other Ambulatory Visit: Payer: Self-pay

## 2021-06-04 NOTE — Telephone Encounter (Signed)
Colchicine (Gout med)

## 2021-06-05 ENCOUNTER — Encounter (HOSPITAL_COMMUNITY): Payer: Self-pay | Admitting: *Deleted

## 2021-06-05 ENCOUNTER — Other Ambulatory Visit: Payer: Self-pay

## 2021-06-05 ENCOUNTER — Emergency Department (HOSPITAL_COMMUNITY)
Admission: EM | Admit: 2021-06-05 | Discharge: 2021-06-05 | Disposition: A | Discharge status: Home / Self Care | Payer: Medicaid Other | Attending: Emergency Medicine | Admitting: Emergency Medicine

## 2021-06-05 ENCOUNTER — Other Ambulatory Visit: Payer: Self-pay | Admitting: Nurse Practitioner

## 2021-06-05 DIAGNOSIS — Z79899 Other long term (current) drug therapy: Secondary | ICD-10-CM | POA: Diagnosis not present

## 2021-06-05 DIAGNOSIS — I129 Hypertensive chronic kidney disease with stage 1 through stage 4 chronic kidney disease, or unspecified chronic kidney disease: Secondary | ICD-10-CM | POA: Insufficient documentation

## 2021-06-05 DIAGNOSIS — M2559 Pain in other specified joint: Secondary | ICD-10-CM | POA: Insufficient documentation

## 2021-06-05 DIAGNOSIS — F17210 Nicotine dependence, cigarettes, uncomplicated: Secondary | ICD-10-CM | POA: Diagnosis not present

## 2021-06-05 DIAGNOSIS — N183 Chronic kidney disease, stage 3 unspecified: Secondary | ICD-10-CM | POA: Insufficient documentation

## 2021-06-05 DIAGNOSIS — M109 Gout, unspecified: Secondary | ICD-10-CM | POA: Insufficient documentation

## 2021-06-05 DIAGNOSIS — M2550 Pain in unspecified joint: Secondary | ICD-10-CM

## 2021-06-05 DIAGNOSIS — M1A0790 Idiopathic chronic gout, unspecified ankle and foot, without tophus (tophi): Secondary | ICD-10-CM

## 2021-06-05 DIAGNOSIS — J45909 Unspecified asthma, uncomplicated: Secondary | ICD-10-CM | POA: Diagnosis not present

## 2021-06-05 MED ORDER — HYDROCODONE-ACETAMINOPHEN 5-325 MG PO TABS: 1.0000 | ORAL_TABLET | ORAL | 0 refills | Status: DC | PRN

## 2021-06-05 MED ORDER — PREDNISONE 10 MG (21) PO TBPK: ORAL_TABLET | Freq: Every day | ORAL | 0 refills | Status: DC

## 2021-06-05 MED ORDER — COLCHICINE 0.6 MG PO TABS: 0.6000 mg | ORAL_TABLET | Freq: Two times a day (BID) | ORAL | 3 refills | Status: DC

## 2021-06-05 MED ORDER — HYDROCODONE-ACETAMINOPHEN 5-325 MG PO TABS
1.0000 | ORAL_TABLET | Freq: Once | ORAL | Status: AC
Start: 2021-06-05 — End: 2021-06-05
  Administered 2021-06-05: 1 via ORAL

## 2021-06-05 NOTE — Discharge Instructions (Signed)
Take medications as prescribed.  Recommend contacting the rheumatology clinic today to inquire about a cancellation list or earlier appointment.  Return to the emergency room for severe concerning symptoms otherwise recheck with your PCP while awaiting rheumatology follow-up.

## 2021-06-05 NOTE — ED Triage Notes (Signed)
Ptc/o pain to Lshoulder, L foot, and R hand. Hx of gout. Reports she ran out of her medication and didn't realize her pharmacy was close Friday

## 2021-06-05 NOTE — ED Provider Notes (Signed)
[name] EMERGENCY DEPARTMENT Provider Note   CSN: [id number] Arrival date & time: 06/05/21  0417     History Chief Complaint  Patient presents with   Gout    Suzanne Graham is a 40 y.o. female.  40 year old female with history of gout presents with complaint of polyarthralgia. Took Colchicine on Wednesday and ran out. Pain started 3-4 days ago in left foot, now left knee and left shoulder. Also reports pain in both hands/fingers from her prior admission which never fully resolved. Aching joints are reportedly painful, red, swollen. Patient took 650mg  Tylenol this morning and elevated, applied an ace, all without significant relief.      Past Medical History:  Diagnosis Date   Acid reflux 08/2019   Asthma    Depression    Gout    Hypertension    Stroke [facility])     Patient Active Problem List   Diagnosis Date Noted   Systemic inflammatory response syndrome ([place]) 03/31/2021   Inflammatory arthritis 03/29/2021   CKD (chronic kidney disease), stage III ([place]) 03/29/2021   Microcytic anemia 03/29/2021   Gout 03/11/2021   AKI (acute kidney injury) ([place]) 03/11/2021   Adverse reaction to non-steroidal anti-inflammatory drug (NSAID), initial encounter 03/11/2021   Vitamin D deficiency 11/17/2019   Chronic gout of foot 10/11/2018   Current smoker 10/11/2018   Generalized pain 10/11/2018   Chronic nonintractable headache 08/06/2017   Essential hypertension 08/06/2017   Stroke ([place]) 07/17/2017    Past Surgical History:  Procedure Laterality Date   LOOP RECORDER INSERTION N/A 08/19/2017   Procedure: LOOP RECORDER INSERTION;  Surgeon: Thompson Grayer, MD;  Location: [place] CV LAB;  Service: Cardiovascular;  Laterality: N/A;   TEE WITHOUT CARDIOVERSION N/A 08/19/2017   Procedure: TRANSESOPHAGEAL ECHOCARDIOGRAM (TEE);  Surgeon: Fay Records, MD;  Location: [facility] ENDOSCOPY;  Service: Cardiovascular;  Laterality: N/A;     OB History     Gravida  0   Para   0   Term  0   Preterm  0   AB  0   Living  0      SAB  0   IAB  0   Ectopic  0   Multiple  0   Live Births  0           Family History  Problem Relation Age of Onset   Heart failure Mother    Hypertension Mother    Stroke Neg Hx     Social History   Tobacco Use   Smoking status: Every Day    Packs/day: 0.25    Types: Cigarettes   Smokeless tobacco: Never  Vaping Use   Vaping Use: Never used  Substance Use Topics   Alcohol use: Not Currently    Comment: rare   Drug use: No    Home Medications Prior to Admission medications   Medication Sig Start Date End Date Taking? Authorizing Provider  HYDROcodone-acetaminophen (NORCO/VICODIN) 5-325 MG tablet Take 1 tablet by mouth every 4 (four) hours as needed. 06/05/21  Yes Tacy Learn, PA-C  predniSONE (STERAPRED UNI-PAK 21 TAB) 10 MG (21) TBPK tablet Take by mouth daily. Take 6 tabs by mouth daily  for 2 days, then 5 tabs for 2 days, then 4 tabs for 2 days, then 3 tabs for 2 days, 2 tabs for 2 days, then 1 tab by mouth daily for 2 days 06/05/21  Yes Tacy Learn, PA-C  acetaminophen (TYLENOL) 500 MG tablet Take  500 mg by mouth every 6 (six) hours as needed for moderate pain or headache.    [provider]  albuterol (VENTOLIN HFA) 108 (90 Base) MCG/ACT inhaler Inhale 2 puffs into the lungs every 6 (six) hours as needed for wheezing or shortness of breath. 05/28/21   Passmore, Enid Derry I, NP  allopurinol (ZYLOPRIM) 100 MG tablet Take 1 tablet (100 mg total) by mouth daily. Patient not taking: Reported on 04/04/2021 03/14/21 04/13/21  Hughie Closs, MD  amLODipine (NORVASC) 10 MG tablet TAKE 1 TABLET BY MOUTH DAILY 05/23/21 05/23/22  Orion Crook I, NP  colchicine 0.6 MG tablet Take 1 tablet (0.6 mg total) by mouth 2 (two) times daily. 06/05/21 10/03/21  Orion Crook I, NP  diclofenac Sodium (VOLTAREN) 1 % GEL Apply 2 grams to affected area four times daily as needed (for musculoskeletal or joint  pain) 03/31/21   Zigmund Daniel., MD  ferrous sulfate 325 (65 FE) MG tablet Take 1 tab by mouth daily with breakfast 03/31/21   Zigmund Daniel., MD  folic acid (FOLVITE) 1 MG tablet take 1 tab by mouth daily 03/31/21   Zigmund Daniel., MD  oxyCODONE-acetaminophen (PERCOCET/ROXICET) 5-325 MG tablet Take 1 tablet by mouth every 8 (eight) hours as needed for severe pain. Patient not taking: Reported on 04/24/2021 03/05/21   Wynetta Fines, MD  vitamin B-12 (CYANOCOBALAMIN) 1000 MCG tablet take 1 tab by mouth daily Patient not taking: Reported on 04/24/2021 03/31/21   Zigmund Daniel., MD    Allergies    Nsaids  Review of Systems   Review of Systems  Constitutional:  Negative for chills, diaphoresis and fever.  Respiratory:  Negative for shortness of breath.   Cardiovascular:  Negative for chest pain.  Gastrointestinal:  Negative for abdominal pain.  Musculoskeletal:  Positive for arthralgias and joint swelling. Negative for back pain and neck pain.  Skin:  Positive for color change. Negative for rash and wound.  Neurological:  Negative for weakness and numbness.  Hematological:  Does not bruise/bleed easily.  Psychiatric/Behavioral:  Negative for confusion.   All other systems reviewed and are negative.  Physical Exam Updated Vital Signs BP (!) 136/92 (BP Location: Right Arm)   Pulse (!) 101   Temp 98.4 F (36.9 C) (Oral)   Resp 16   LMP 05/22/2021   SpO2 98%   Physical Exam Vitals and nursing note reviewed.  Constitutional:      General: She is not in acute distress.    Appearance: She is well-developed. She is not diaphoretic.  HENT:     Head: Normocephalic and atraumatic.  Cardiovascular:     Pulses: Normal pulses.  Pulmonary:     Effort: Pulmonary effort is normal.  Musculoskeletal:        General: Swelling and tenderness present. No deformity or signs of injury.     Cervical back: Neck supple.     Comments: Tenderness with palpation of the  left foot, left knee, left shoulder.  Joints are mildly swollen without erythema or notable effusion.  Skin:    General: Skin is warm and dry.     Findings: No erythema.  Neurological:     Mental Status: She is alert and oriented to person, place, and time.     Sensory: No sensory deficit.     Motor: No weakness.  Psychiatric:        Behavior: Behavior normal.    ED Results / Procedures / Treatments  Labs (all labs ordered are listed, but only abnormal results are displayed) Labs Reviewed - No data to display  EKG None  Radiology No results found.  Procedures Procedures   Medications Ordered in ED Medications  HYDROcodone-acetaminophen (NORCO/VICODIN) 5-325 MG per tablet 1 tablet (1 tablet Oral Given 06/05/21 [id number])    ED Course  I have reviewed the triage vital signs and the nursing notes.  Pertinent labs & imaging results that were available during my care of the patient were reviewed by me and considered in my medical decision making (see chart for details).  Clinical Course as of 06/05/21 1036  Tue Nov 29, 345  [id number] 40 year old female with complaint of polyarthralgia as above.  Prior chart from September admission reviewed including her admission notes, imaging, culture reports and lab work.  Patient was admitted with concern for SIRS criteria with her polyarthralgia and fever, ultimately diagnosed with polyarthralgia likely gout related. Patient is scheduled to follow-up with rheumatology in 3 weeks.  Pain started this time after running out of her colchicine.  She is found to have pain and swelling in her left foot, knee, shoulder without overlying erythema or notable effusion.  Pain is similar to her prior flares.  She is afebrile today.  Plan is to treat with Norco, prednisone taper and given prescription for her colchicine.  Advised patient to return to the emergency room for any worsening or concerning symptoms, follow-up with PCP well awaiting rheumatology  follow-up.  Also advised to contact rheumatology clinic and inquire about cancellation list for earlier appointment. [LM]    Clinical Course User Index [LM] Roque Lias   MDM Rules/Calculators/A&P                           Final Clinical Impression(s) / ED Diagnoses Final diagnoses:  Polyarthralgia    Rx / DC Orders ED Discharge Orders          Ordered    HYDROcodone-acetaminophen (NORCO/VICODIN) 5-325 MG tablet  Every 4 hours PRN        06/05/21 0904    predniSONE (STERAPRED UNI-PAK 21 TAB) 10 MG (21) TBPK tablet  Daily        06/05/21 0904             Tacy Learn, PA-C 06/05/21 1036    Pattricia Boss, MD 06/06/21 1021

## 2021-06-08 ENCOUNTER — Ambulatory Visit (INDEPENDENT_AMBULATORY_CARE_PROVIDER_SITE_OTHER): Payer: Medicaid Other | Admitting: Nurse Practitioner

## 2021-06-08 ENCOUNTER — Encounter: Payer: Self-pay | Admitting: Nurse Practitioner

## 2021-06-08 ENCOUNTER — Other Ambulatory Visit: Payer: Self-pay

## 2021-06-08 VITALS — BP 143/93 | HR 83 | Temp 98.0°F | Ht 64.0 in | Wt 184.0 lb

## 2021-06-08 DIAGNOSIS — Z0000 Encounter for general adult medical examination without abnormal findings: Secondary | ICD-10-CM | POA: Diagnosis not present

## 2021-06-08 DIAGNOSIS — M1000 Idiopathic gout, unspecified site: Secondary | ICD-10-CM | POA: Diagnosis not present

## 2021-06-08 MED ORDER — PREDNISONE 10 MG (21) PO TBPK: ORAL_TABLET | Freq: Every day | ORAL | 0 refills | Status: DC

## 2021-06-08 NOTE — Patient Instructions (Signed)
You were seen today in the [facility] for reevaluation of gout pain and referral. You were prescribed medications, please take as directed. Please follow up in 6 mths for reevaluation.
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
## 2021-06-21 NOTE — Progress Notes (Signed)
Office Visit Note  Patient: Suzanne Graham             Date of Birth: 04-25-1981           MRN: [id number]             PCP: Kathrynn Speed, NP Referring: Kathrynn Speed, NP Visit Date: 06/22/2021  Subjective:   History of Present Illness: Suzanne Graham is a 39 y.o. female here for gout. She has been treated at the ED multiple times this year due to recurrent pretty frequent episodes of inflammation. 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  Most recent visit for acute flareup at end of last month that became severely inflamed after running out of colchicine during onset of flareup.  Was treated with prednisone taper refill of colchicine and just initial treatment of Norco with symptom improvement.  She has not been consistently on prescription urate lowering therapy throughout this time.  Laboratory testing in September also demonstrated a low positive rheumatoid factor test.  Her inflammatory episodes have pretty much been limited to the knees and ankles or toes without problems of hand or elbow severe inflammation.  Currently today has not fully recovered from recent inflammatory episodes knees are still inflamed worse on the right side.  Labs reviewed 03/30/21 RF 26.3 ANA neg CCP neg  Activities of Daily Living:  Patient reports morning stiffness for all day.  Patient Reports nocturnal pain.  Difficulty dressing/grooming: Reports Difficulty climbing stairs: Reports Difficulty getting out of chair: Reports Difficulty using hands for taps, buttons, cutlery, and/or writing: Reports  Review of Systems  Constitutional:  Negative for fatigue.  HENT:  Negative for mouth sores, mouth dryness and nose dryness.   Eyes:  Positive for dryness. Negative for pain and itching.  Respiratory:  Positive for shortness of breath. Negative for difficulty breathing.   Cardiovascular:  Negative for chest pain and palpitations.  Gastrointestinal:  Negative  for blood in stool, constipation and diarrhea.  Endocrine: Negative for increased urination.  Genitourinary:  Negative for difficulty urinating.  Musculoskeletal:  Positive for joint pain, joint pain, joint swelling and morning stiffness. Negative for myalgias, muscle tenderness and myalgias.  Skin:  Negative for color change, rash and redness.  Allergic/Immunologic: Negative for susceptible to infections.  Neurological:  Positive for headaches. Negative for dizziness, numbness, memory loss and weakness.  Hematological:  Positive for bruising/bleeding tendency.  Psychiatric/Behavioral:  Negative for confusion.     PMFS History:  Patient Active Problem List   Diagnosis Date Noted   High risk medication use 06/22/2021   Systemic inflammatory response syndrome (HCC) 03/31/2021   Inflammatory arthritis 03/29/2021   CKD (chronic kidney disease), stage III (HCC) 03/29/2021   Microcytic anemia 03/29/2021   AKI (acute kidney injury) (HCC) 03/11/2021   Adverse reaction to non-steroidal anti-inflammatory drug (NSAID), initial encounter 03/11/2021   Vitamin D deficiency 11/17/2019   Chronic gout of foot 10/11/2018   Current smoker 10/11/2018   Generalized pain 10/11/2018   Chronic nonintractable headache 08/06/2017   Essential hypertension 08/06/2017   Stroke (HCC) 07/17/2017    Past Medical History:  Diagnosis Date   Acid reflux 08/2019   Asthma    Depression    Gout    Hypertension    Stroke [facility])     Family History  Problem Relation Age of Onset   Heart failure Mother    Hypertension Mother    Stroke Neg Hx    Past Surgical History:  Procedure Laterality Date   LOOP RECORDER INSERTION N/A 08/19/2017   Procedure: LOOP RECORDER INSERTION;  Surgeon: Hillis Range, MD;  Location: [facility];  Service: Cardiovascular;  Laterality: N/A;   TEE WITHOUT CARDIOVERSION N/A 08/19/2017   Procedure: TRANSESOPHAGEAL ECHOCARDIOGRAM (TEE);  Surgeon: Pricilla Riffle, MD;  Location: [facility]  ENDOSCOPY;  Service: Cardiovascular;  Laterality: N/A;   Social History   Social History Narrative   Not on file    There is no immunization history on file for this patient.   Objective: Vital Signs: BP 134/86 (BP Location: Right Arm, Patient Position: Sitting, Cuff Size: Large)   Pulse 79   Ht 5\' 4"  (1.626 m)   Wt 183 lb (83 kg)   LMP 05/22/2021   BMI 31.41 kg/m    Physical Exam Constitutional:      Appearance: She is obese.     Comments: In wheelchair  Eyes:     Conjunctiva/sclera: Conjunctivae normal.  Cardiovascular:     Rate and Rhythm: Normal rate and regular rhythm.  Pulmonary:     Effort: Pulmonary effort is normal.     Breath sounds: Normal breath sounds.  Musculoskeletal:     Right lower leg: No edema.     Left lower leg: No edema.  Skin:    General: Skin is warm and dry.     Findings: No rash.  Neurological:     Mental Status: She is alert.  Psychiatric:        Mood and Affect: Mood normal.      Musculoskeletal Exam:  Elbows full ROM right side nodule in olecranon bursa left normal Wrists full ROM no tenderness or swelling Fingers full ROM no tenderness or swelling Knees decreased extension and flexion b/l, right knee swelling worse than left Ankles full ROM no tenderness or swelling 1st MTP joint lateral deviation with bony nodule and decreased ROM   Investigation: No additional findings.  Imaging: No results found.  Recent Labs: Lab Results  Component Value Date   WBC 8.3 01/03/2022   HGB 10.7 (L) 01/03/2022   PLT 288 01/03/2022   NA 138 01/03/2022   K 3.9 01/03/2022   CL 109 01/03/2022   CO2 21 (L) 01/03/2022   GLUCOSE 87 01/03/2022   BUN 15 01/03/2022   CREATININE 1.13 (H) 01/03/2022   BILITOT 0.3 12/12/2021   ALKPHOS 56 12/12/2021   AST 20 12/12/2021   ALT 11 12/12/2021   PROT 7.4 12/12/2021   ALBUMIN 4.4 12/12/2021   CALCIUM 9.4 01/03/2022   GFRAA 72 11/15/2019   QFTBGOLDPLUS NEGATIVE 06/22/2021    Speciality  Comments: No specialty comments available.  Procedures:  No procedures performed Allergies: Nsaids   Assessment / Plan:     Visit Diagnoses: Inflammatory arthritis Chronic gout of multiple sites, unspecified cause - Plan: Uric acid  Appears to be very out-of-control gout most likely coming secondary to chronic kidney disease stage IIIa.  We will check serum uric acid level today this could be slightly lower than true value given ongoing symptoms of inflammation.  Agree with continuing to treat the acute inflammation with colchicine and prednisone but will need to start allopurinol.  Starting dose of 100 mg and will have to titrate somewhat slowly due to chronic kidney disease for safety.  Stage 3a chronic kidney disease (HCC)  Probably the main cause of this refractory gout and also requires slow allopurinol titration.  High risk medication use - Plan: Hepatitis B core antibody, IgM, Hepatitis B surface  antigen, Hepatitis C antibody, QuantiFERON-TB Gold Plus  Positive rheumatoid factor and persistent joint inflammation if uric acid is less elevated than expected would consider whether DMARD treatment needed.  Checking baseline hepatitis B and C and QuantiFERON today.  Orders: Orders Placed This Encounter  Procedures   Uric acid   Hepatitis B core antibody, IgM   Hepatitis B surface antigen   Hepatitis C antibody   QuantiFERON-TB Gold Plus    Meds ordered this encounter  Medications   DISCONTD: allopurinol (ZYLOPRIM) 100 MG tablet    Sig: Take 1 tablet (100 mg total) by mouth daily.    Dispense:  30 tablet    Refill:  0   DISCONTD: allopurinol (ZYLOPRIM) 100 MG tablet    Sig: Take 1 tablet (100 mg total) by mouth daily.    Dispense:  30 tablet    Refill:  1      Follow-Up Instructions: Return in about 4 weeks (around 07/20/2021) for New pt gout allopurinol/colchicine f/u 4wks.   Collier Salina, MD  Note - This record has been created using [employer].  Chart  creation errors have been sought, but may not always  have been located. Such creation errors do not reflect on  the standard of medical care.

## 2021-06-22 ENCOUNTER — Other Ambulatory Visit: Payer: Self-pay

## 2021-06-22 ENCOUNTER — Encounter: Payer: Self-pay | Admitting: Internal Medicine

## 2021-06-22 ENCOUNTER — Ambulatory Visit (INDEPENDENT_AMBULATORY_CARE_PROVIDER_SITE_OTHER): Payer: Medicaid Other | Admitting: Internal Medicine

## 2021-06-22 VITALS — BP 134/86 | HR 79 | Ht 64.0 in | Wt 183.0 lb

## 2021-06-22 DIAGNOSIS — N1831 Chronic kidney disease, stage 3a: Secondary | ICD-10-CM

## 2021-06-22 DIAGNOSIS — M1A0790 Idiopathic chronic gout, unspecified ankle and foot, without tophus (tophi): Secondary | ICD-10-CM

## 2021-06-22 DIAGNOSIS — M1990 Unspecified osteoarthritis, unspecified site: Secondary | ICD-10-CM | POA: Diagnosis not present

## 2021-06-22 DIAGNOSIS — M1380 Other specified arthritis, unspecified site: Secondary | ICD-10-CM

## 2021-06-22 DIAGNOSIS — M10471 Other secondary gout, right ankle and foot: Secondary | ICD-10-CM

## 2021-06-22 DIAGNOSIS — Z79899 Other long term (current) drug therapy: Secondary | ICD-10-CM

## 2021-06-22 DIAGNOSIS — M1A09X0 Idiopathic chronic gout, multiple sites, without tophus (tophi): Secondary | ICD-10-CM | POA: Diagnosis not present

## 2021-06-22 MED ORDER — ALLOPURINOL 100 MG PO TABS
100.0000 mg | ORAL_TABLET | Freq: Every day | ORAL | 1 refills | Status: DC
  Filled 2021-06-22: qty 30, 30d supply, fill #0

## 2021-06-22 MED ORDER — ALLOPURINOL 100 MG PO TABS
100.0000 mg | ORAL_TABLET | Freq: Every day | ORAL | 0 refills | Status: DC
  Filled 2021-06-22: qty 30, 30d supply, fill #0

## 2021-06-25 ENCOUNTER — Telehealth: Payer: Self-pay | Admitting: Internal Medicine

## 2021-06-25 NOTE — Progress Notes (Signed)
Labs show uric acid 12.0 which is very high. This is consistent with gout as the cause of arthritis. She should continue the allopurinol 100 mg and colchicine for now and we will recheck in a month, she can let us know if there is a bad flare up in the meantime.

## 2021-06-25 NOTE — Telephone Encounter (Signed)
Returned patient's call. See lab note for details.  °

## 2021-06-25 NOTE — Telephone Encounter (Signed)
Patient called the office stating she was returning Suzanne Graham's call.

## 2021-06-25 NOTE — Telephone Encounter (Signed)
Patient asked if her disability paperwork had been completed. I spoke with Dr. Dimple Casey and he will complete the paperwork tonight and it will be ready tomorrow. I advised patient and she verbalized understanding.

## 2021-06-27 LAB — URIC ACID: Uric Acid, Serum: 12 mg/dL — ABNORMAL HIGH (ref 2.5–7.0)

## 2021-06-27 LAB — HEPATITIS C ANTIBODY
Hepatitis C Ab: NONREACTIVE
SIGNAL TO CUT-OFF: 0.06 (ref <1.00)

## 2021-06-27 LAB — HEPATITIS B SURFACE ANTIGEN: Hepatitis B Surface Ag: NONREACTIVE

## 2021-06-27 LAB — QUANTIFERON-TB GOLD PLUS
Mitogen-NIL: 5.31 IU/mL
NIL: 0.05 IU/mL
QuantiFERON-TB Gold Plus: NEGATIVE
TB1-NIL: 0 IU/mL
TB2-NIL: 0.01 IU/mL

## 2021-06-27 LAB — HEPATITIS B CORE ANTIBODY, IGM: Hep B C IgM: NONREACTIVE

## 2021-07-03 ENCOUNTER — Other Ambulatory Visit: Payer: Self-pay

## 2021-07-24 NOTE — Progress Notes (Signed)
Office Visit Note  Patient: Suzanne Graham             Date of Birth: [birth year]           MRN: [id number]             PCP: Teena Dunk, NP Referring: Bo Merino I, NP Visit Date: 07/25/2021   Subjective:   History of Present Illness: Suzanne Graham is a 40 y.o. 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  After our last visit her symptoms improved quickly with the short prednisone taper but then returned again.  Right now having the most trouble with pain and swelling throughout her foot and ankle.  She is not experience any trouble taking the allopurinol or colchicine.   Review of Systems  Constitutional:  Negative for fatigue.  HENT:  Negative for mouth sores, mouth dryness and nose dryness.   Eyes:  Negative for pain, itching and dryness.  Respiratory:  Negative for shortness of breath and difficulty breathing.   Cardiovascular:  Negative for chest pain and palpitations.  Gastrointestinal:  Negative for blood in stool, constipation and diarrhea.  Endocrine: Negative for increased urination.  Genitourinary:  Negative for difficulty urinating.  Musculoskeletal:  Positive for joint pain, joint pain, joint swelling, myalgias, morning stiffness, muscle tenderness and myalgias.  Skin:  Negative for color change, rash and redness.  Allergic/Immunologic: Negative for susceptible to infections.  Neurological:  Negative for dizziness, numbness, headaches, memory loss and weakness.   PMFS History:  Patient Active Problem List   Diagnosis Date Noted   High risk medication use 06/22/2021   Systemic inflammatory response syndrome ([place]) 03/31/2021   Inflammatory arthritis 03/29/2021   CKD (chronic kidney disease), stage III ([place]) 03/29/2021   Microcytic anemia 03/29/2021   Gout 03/11/2021   AKI (acute kidney injury) ([place]) 03/11/2021    Adverse reaction to non-steroidal anti-inflammatory drug (NSAID), initial encounter 03/11/2021   Vitamin D deficiency 11/17/2019   Chronic gout of foot 10/11/2018   Current smoker 10/11/2018   Generalized pain 10/11/2018   Chronic nonintractable headache 08/06/2017   Essential hypertension 08/06/2017   Stroke ([place]) 07/17/2017    Past Medical History:  Diagnosis Date   Acid reflux 08/2019   Asthma    Depression    Gout    Hypertension    Stroke ([place])     Family History  Problem Relation Age of Onset   Heart failure Mother    Hypertension Mother    Stroke Neg Hx    Past Surgical History:  Procedure Laterality Date   LOOP RECORDER INSERTION N/A 08/19/2017   Procedure: LOOP RECORDER INSERTION;  Surgeon: Thompson Grayer, MD;  Location: [place] [facility];  Service: Cardiovascular;  Laterality: N/A;   TEE WITHOUT CARDIOVERSION N/A 08/19/2017   Procedure: TRANSESOPHAGEAL ECHOCARDIOGRAM (TEE);  Surgeon: Fay Records, MD;  Location: [facility] ENDOSCOPY;  Service: Cardiovascular;  Laterality: N/A;   Social History   Social History Narrative   Not on file    There is no immunization history on file for this patient.   Objective: Vital Signs: BP (!) 142/97 (BP Location: Right Arm, Patient Position: Sitting, Cuff Size: Normal)    Pulse 83    Ht 5\' 4"  (1.626 m)    Wt 197 lb (89.4 kg) Comment: per patient   BMI 33.81 kg/m    Physical  Exam Constitutional:      Appearance: She is obese.     Comments: In wheelchair  Cardiovascular:     Rate and Rhythm: Normal rate and regular rhythm.  Pulmonary:     Effort: Pulmonary effort is normal.     Breath sounds: Normal breath sounds.  Musculoskeletal:     Right lower leg: No edema.     Left lower leg: No edema.  Skin:    General: Skin is warm and dry.  Neurological:     Mental Status: She is alert.  Psychiatric:        Mood and Affect: Mood normal.     Musculoskeletal Exam:  Elbows full ROM no tenderness or swelling Wrists full ROM no  tenderness or swelling Fingers full ROM no tenderness or swelling Knees full ROM no tenderness or swelling Right ankle normal left ankle tenderness, warmth, decreased ROM and swelling worst above and below medial malleolus   Investigation: No additional findings.  Imaging: No results found.  Recent Labs: Lab Results  Component Value Date   WBC 6.1 04/24/2021   HGB 9.2 (L) 04/24/2021   PLT 239 04/24/2021   NA 141 04/24/2021   K 4.8 04/24/2021   CL 103 04/24/2021   CO2 23 04/24/2021   GLUCOSE 71 04/24/2021   BUN 14 04/24/2021   CREATININE 1.34 (H) 04/24/2021   BILITOT 0.3 04/24/2021   ALKPHOS 60 04/24/2021   AST 11 04/24/2021   ALT 14 04/24/2021   PROT 5.9 (L) 04/24/2021   ALBUMIN 3.6 (L) 04/24/2021   CALCIUM 9.8 04/24/2021   GFRAA 72 11/15/2019   QFTBGOLDPLUS NEGATIVE 06/22/2021    Speciality Comments: No specialty comments available.  Procedures:  No procedures performed Allergies: Nsaids   Assessment / Plan:     Visit Diagnoses: Chronic gout due to renal impairment involving foot without tophus, unspecified laterality - Plan: Uric acid  Looks to still be active with acute return of right ankle pain and inflammation it is obviously swollen warm and tender on exam today.  Rechecking uric acid now on the allopurinol.  We will plan to titrate by 100 mg increment to 200 mg daily.  Continue colchicine 0.6 mg for flare prophylaxis.  Repeating 6-day steroid taper for current acute flare. She can get labs checked for next dose increase without office visit next month if doing okay.  Stage 3a chronic kidney disease ([place])  Continuing with the slower titration of allopurinol for increased risk due to underlying renal impairment.   Orders: Orders Placed This Encounter  Procedures   Uric acid   Meds ordered this encounter  Medications   predniSONE (STERAPRED UNI-PAK 21 TAB) 10 MG (21) TBPK tablet    Sig: Take by mouth daily. Take once daily by mouth decreasing dose over  6 days: 6, 5, 4, 3, 2, 1 tablet per day    Dispense:  21 tablet    Refill:  0     Follow-Up Instructions: Return in about 3 months (around 10/23/2021) for Gout on allopurinol/colchicine/GC taper f/u 106mos.   Collier Salina, MD  Note - This record has been created using [employer].  Chart creation errors have been sought, but may not always  have been located. Such creation errors do not reflect on  the standard of medical care.

## 2021-07-25 ENCOUNTER — Encounter: Payer: Self-pay | Admitting: Internal Medicine

## 2021-07-25 ENCOUNTER — Other Ambulatory Visit: Payer: Self-pay

## 2021-07-25 ENCOUNTER — Ambulatory Visit: Payer: Self-pay | Admitting: Nurse Practitioner

## 2021-07-25 ENCOUNTER — Ambulatory Visit: Payer: Medicaid Other | Admitting: Internal Medicine

## 2021-07-25 VITALS — BP 142/97 | HR 83 | Ht 64.0 in | Wt 197.0 lb

## 2021-07-25 DIAGNOSIS — M1A0790 Idiopathic chronic gout, unspecified ankle and foot, without tophus (tophi): Secondary | ICD-10-CM

## 2021-07-25 DIAGNOSIS — N1831 Chronic kidney disease, stage 3a: Secondary | ICD-10-CM | POA: Diagnosis not present

## 2021-07-25 DIAGNOSIS — M10471 Other secondary gout, right ankle and foot: Secondary | ICD-10-CM | POA: Diagnosis not present

## 2021-07-25 DIAGNOSIS — M1000 Idiopathic gout, unspecified site: Secondary | ICD-10-CM

## 2021-07-25 DIAGNOSIS — M1A3790 Chronic gout due to renal impairment, unspecified ankle and foot, without tophus (tophi): Secondary | ICD-10-CM | POA: Diagnosis not present

## 2021-07-25 LAB — URIC ACID: Uric Acid, Serum: 7.2 mg/dL — ABNORMAL HIGH (ref 2.5–7.0)

## 2021-07-25 MED ORDER — PREDNISONE 10 MG (21) PO TBPK: ORAL_TABLET | Freq: Every day | ORAL | 0 refills | Status: DC

## 2021-07-25 MED ORDER — COLCHICINE 0.6 MG PO TABS: 0.6000 mg | ORAL_TABLET | Freq: Two times a day (BID) | ORAL | 3 refills | Status: DC

## 2021-07-25 MED ORDER — ALLOPURINOL 100 MG PO TABS: 200.0000 mg | ORAL_TABLET | Freq: Every day | ORAL | 1 refills | Status: DC

## 2021-07-26 NOTE — Progress Notes (Signed)
Uric acid is 7.2 which is improving compared to 12.0 before but still not at goal of under 6. She should increase the allopurinol to 200 mg (2 tablets) daily as planned.

## 2021-08-02 ENCOUNTER — Other Ambulatory Visit: Payer: Self-pay | Admitting: Internal Medicine

## 2021-08-02 DIAGNOSIS — M1000 Idiopathic gout, unspecified site: Secondary | ICD-10-CM

## 2021-08-15 ENCOUNTER — Other Ambulatory Visit: Payer: Self-pay

## 2021-08-15 ENCOUNTER — Other Ambulatory Visit: Payer: Self-pay | Admitting: Nurse Practitioner

## 2021-08-15 DIAGNOSIS — F17200 Nicotine dependence, unspecified, uncomplicated: Secondary | ICD-10-CM

## 2021-10-01 ENCOUNTER — Telehealth: Payer: Self-pay | Admitting: Nurse Practitioner

## 2021-10-01 NOTE — Telephone Encounter (Signed)
Blood pressure meds refill request ?

## 2021-10-03 ENCOUNTER — Other Ambulatory Visit: Payer: Self-pay

## 2021-10-03 ENCOUNTER — Other Ambulatory Visit (HOSPITAL_BASED_OUTPATIENT_CLINIC_OR_DEPARTMENT_OTHER): Payer: Self-pay

## 2021-10-16 ENCOUNTER — Other Ambulatory Visit: Payer: Self-pay

## 2021-10-16 ENCOUNTER — Other Ambulatory Visit: Payer: Self-pay | Admitting: Nurse Practitioner

## 2021-10-16 DIAGNOSIS — R0602 Shortness of breath: Secondary | ICD-10-CM

## 2021-10-23 ENCOUNTER — Encounter: Payer: Self-pay | Admitting: Internal Medicine

## 2021-10-23 ENCOUNTER — Ambulatory Visit (INDEPENDENT_AMBULATORY_CARE_PROVIDER_SITE_OTHER): Payer: Medicaid Other | Admitting: Internal Medicine

## 2021-10-23 VITALS — BP 145/94 | HR 84 | Resp 15 | Ht 64.0 in | Wt 177.0 lb

## 2021-10-23 DIAGNOSIS — M1A0790 Idiopathic chronic gout, unspecified ankle and foot, without tophus (tophi): Secondary | ICD-10-CM

## 2021-10-23 DIAGNOSIS — N1831 Chronic kidney disease, stage 3a: Secondary | ICD-10-CM

## 2021-10-23 DIAGNOSIS — M10471 Other secondary gout, right ankle and foot: Secondary | ICD-10-CM | POA: Diagnosis not present

## 2021-10-23 DIAGNOSIS — M1A3790 Chronic gout due to renal impairment, unspecified ankle and foot, without tophus (tophi): Secondary | ICD-10-CM | POA: Diagnosis not present

## 2021-10-23 NOTE — Progress Notes (Signed)
? ?Office Visit Note ? ?Patient: Suzanne Graham             ?Date of Birth: [birth year]           ?MRN: [id number]             ?PCP: Teena Dunk, NP ?Referring: Teena Dunk, NP ?Visit Date: 10/23/2021 ? ? ?Subjective:  ?Gout ([place] bil feet) ? ? ?History of Present Illness: Suzanne Graham is a 41 y.o. female here for follow up for gout on allopurinol 200 mg daily and colchicine. She is generally doing well she has some mild upper respiratory symptoms she attributes to seasonal allergies. Within the past week new pain in both feet started on the left foot on dorsal and lateral side and within past 2 days started having pain in similar area of the right foot. She recalls doing a lot of walking in possible wrong sized shoes but not sure about any other preceding event.  ? ?Previous HPI ?07/25/21 ?Suzanne Graham is a 41 y.o. 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  After our last visit her symptoms improved quickly with the short prednisone taper but then returned again.  Right now having the most trouble with pain and swelling throughout her foot and ankle.  She is not experience any trouble taking the allopurinol or colchicine. ?  ? ? ?Review of Systems  ?Constitutional:  Negative for fatigue.  ?HENT:  Negative for mouth dryness.   ?Eyes:  Negative for dryness.  ?Respiratory:  Negative for shortness of breath.   ?Cardiovascular:  Positive for swelling in legs/feet.  ?Gastrointestinal:  Negative for constipation.  ?Endocrine: Positive for cold intolerance.  ?Genitourinary:  Negative for difficulty urinating.  ?Musculoskeletal:  Positive for joint pain, joint pain, joint swelling and morning stiffness.  ?Skin:  Negative for rash.  ?Allergic/Immunologic: Negative for susceptible to infections.  ?Neurological:  Negative for weakness.  ?Hematological:  Negative for  bruising/bleeding tendency.  ?Psychiatric/Behavioral:  Negative for sleep disturbance.   ? ?PMFS History:  ?Patient Active Problem List  ? Diagnosis Date Noted  ? High risk medication use 06/22/2021  ? Systemic inflammatory response syndrome ([place]) 03/31/2021  ? Inflammatory arthritis 03/29/2021  ? CKD (chronic kidney disease), stage III ([place]) 03/29/2021  ? Microcytic anemia 03/29/2021  ? AKI (acute kidney injury) ([place]) 03/11/2021  ? Adverse reaction to non-steroidal anti-inflammatory drug (NSAID), initial encounter 03/11/2021  ? Vitamin D deficiency 11/17/2019  ? Chronic gout of foot 10/11/2018  ? Current smoker 10/11/2018  ? Generalized pain 10/11/2018  ? Chronic nonintractable headache 08/06/2017  ? Essential hypertension 08/06/2017  ? Stroke ([place]) 07/17/2017  ?  ?Past Medical History:  ?Diagnosis Date  ? Acid reflux 08/2019  ? Asthma   ? Depression   ? Gout   ? Hypertension   ? Stroke [facility])   ?  ?Family History  ?Problem Relation Age of Onset  ? Heart failure Mother   ? Hypertension Mother   ? Stroke Neg Hx   ? ?Past Surgical History:  ?Procedure Laterality Date  ? LOOP RECORDER INSERTION N/A 08/19/2017  ? Procedure: LOOP RECORDER INSERTION;  Surgeon: Thompson Grayer, MD;  Location: [facility] CV LAB;  Service: Cardiovascular;  Laterality: N/A;  ? TEE WITHOUT CARDIOVERSION N/A 08/19/2017  ? Procedure: TRANSESOPHAGEAL ECHOCARDIOGRAM (TEE);  Surgeon: Fay Records, MD;  Location: [place];  Service: Cardiovascular;  Laterality: N/A;  ? ?Social History  ? ?Social History Narrative  ? Not on file  ? ? ?There is no immunization history on file for this patient.  ? ?Objective: ?Vital Signs: BP (!) 145/94 (BP Location: Left Arm, Patient Position: Sitting, Cuff Size: Normal)   Pulse 84   Resp 15   Ht 5\' 4"  (1.626 m)   Wt 177 lb (80.3 kg)   BMI 30.38 kg/m?   ? ?Physical Exam ?Cardiovascular:  ?   Rate and Rhythm: Normal rate and regular rhythm.  ?Pulmonary:  ?   Effort: Pulmonary effort is normal.  ?   Breath  sounds: Normal breath sounds.  ?Musculoskeletal:  ?   Right lower leg: No edema.  ?   Left lower leg: No edema.  ?Skin: ?   General: Skin is warm and dry.  ?   Findings: No rash.  ?Neurological:  ?   Mental Status: She is alert.  ?Psychiatric:     ?   Mood and Affect: Mood normal.  ?  ? ?Musculoskeletal Exam:  ?Elbows full ROM no tenderness or swelling ?Wrists full ROM no tenderness or swelling ?Fingers full ROM no tenderness or swelling ?Knees full ROM no tenderness or swelling ?Ankles full ROM no tenderness or swelling ?Mild soft tissue swelling on lateral midfoot no warmth or discoloration present ?No MTP tenderness or swelling ? ? ?Investigation: ?No additional findings. ? ?Imaging: ?No results found. ? ?Recent Labs: ?Lab Results  ?Component Value Date  ? WBC 6.1 04/24/2021  ? HGB 9.2 (L) 04/24/2021  ? PLT 239 04/24/2021  ? NA 141 04/24/2021  ? K 4.8 04/24/2021  ? CL 103 04/24/2021  ? CO2 23 04/24/2021  ? GLUCOSE 71 04/24/2021  ? BUN 14 04/24/2021  ? CREATININE 1.34 (H) 04/24/2021  ? BILITOT 0.3 04/24/2021  ? ALKPHOS 60 04/24/2021  ? AST 11 04/24/2021  ? ALT 14 04/24/2021  ? PROT 5.9 (L) 04/24/2021  ? ALBUMIN 3.6 (L) 04/24/2021  ? CALCIUM 9.8 04/24/2021  ? GFRAA 72 11/15/2019  ? QFTBGOLDPLUS NEGATIVE 06/22/2021  ? ? ?Speciality Comments: No specialty comments available. ? ?Procedures:  ?No procedures performed ?Allergies: Nsaids  ? ?Assessment / Plan:     ?Visit Diagnoses: Chronic gout due to renal impairment involving foot without tophus, unspecified laterality - Plan: Sedimentation rate, Uric acid ? ?Doing well overall on current medications no bad flares. I am not sure whether current foot pain is really gout activity, symmetric involvement and minimal warmth, swelling, or tenderness on exam. Checking sed rate for evaluation. If very high or if she updates [place] symptoms not doing better on just colchicine within another 2 days would repeat prednisone taper. Repeating uric acid now on allopurinol 200 mg daily  dose. If still above goal will titrate to 300 gm daily if at goal continue same as before. ? ?Stage 3a chronic kidney disease ([place]) ? ?Titrating allopurinol dose slowly for chronic renal impairment as above. ? ?Orders: ?Orders Placed This Encounter  ?Procedures  ? Sedimentation rate  ? Uric acid  ? ?No orders of the defined types were placed in this encounter. ? ? ? ?Follow-Up Instructions: Return in about 3 months (around 01/22/2022) for Gout on allopurinol f/u 13mos. ? ? ?Collier Salina, MD ? ?Note - This record has been created using [employer].  ?Chart creation errors have been sought, but may not always  ?have been located. Such creation errors do not reflect on  ?the standard of medical  care. ? ?

## 2021-10-24 LAB — URIC ACID: Uric Acid, Serum: 6.2 mg/dL (ref 2.5–7.0)

## 2021-10-24 LAB — SEDIMENTATION RATE: Sed Rate: 17 mm/h (ref 0–20)

## 2021-10-24 MED ORDER — COLCHICINE 0.6 MG PO TABS: 0.6000 mg | ORAL_TABLET | Freq: Two times a day (BID) | ORAL | 2 refills | Status: DC | PRN

## 2021-10-24 MED ORDER — ALLOPURINOL 300 MG PO TABS: 300.0000 mg | ORAL_TABLET | Freq: Every day | ORAL | 1 refills | Status: DC

## 2021-10-24 NOTE — Addendum Note (Signed)
Addended by: Fuller Plan on: 10/24/2021 07:48 AM ? ? Modules accepted: Orders ? ?

## 2021-10-24 NOTE — Progress Notes (Signed)
The sedimentation rate is normal hers was very high during flare ups last year. This suggests against a gout flare as a cause for foot pain I recommend just monitoring symptoms for right now. ? ?Uric acid level is 6.2 this is still slightly above the goal, she can increase allopurinol to 300 mg daily this will be 1 tablet of the higher strength prescription.

## 2021-12-07 ENCOUNTER — Ambulatory Visit: Payer: Medicaid Other | Admitting: Nurse Practitioner

## 2021-12-12 ENCOUNTER — Ambulatory Visit: Payer: Medicaid Other | Admitting: Nurse Practitioner

## 2021-12-12 ENCOUNTER — Encounter: Payer: Self-pay | Admitting: Nurse Practitioner

## 2021-12-12 VITALS — BP 121/101 | HR 79 | Temp 97.4°F | Ht 64.0 in | Wt 179.2 lb

## 2021-12-12 DIAGNOSIS — M1A3790 Chronic gout due to renal impairment, unspecified ankle and foot, without tophus (tophi): Secondary | ICD-10-CM | POA: Diagnosis not present

## 2021-12-12 DIAGNOSIS — M1990 Unspecified osteoarthritis, unspecified site: Secondary | ICD-10-CM | POA: Diagnosis not present

## 2021-12-12 DIAGNOSIS — Z0000 Encounter for general adult medical examination without abnormal findings: Secondary | ICD-10-CM | POA: Diagnosis not present

## 2021-12-12 NOTE — Progress Notes (Signed)
[facility] [place], [place]  [address] Phone:  [phone]   Fax:  [phone] Subjective:   Patient ID: Suzanne Graham, female    DOB: 12-Nov-1980, 41 y.o.   MRN: [id number]  Chief Complaint  Patient presents with   Follow-up    6 month follow up;chronic illness/gout   HPI Suzanne Graham 41 y.o. female  has a past medical history of Acid reflux (08/2019), Asthma, Depression, Gout, Hypertension, and Stroke ([place]). To the [facility] for reevaluation of gout pain and hypertension.  Hypertension: Patient here for follow-up of elevated blood pressure. She is not exercising and is adherent to low salt diet.  Does not check B/P regularly at home. Cardiac symptoms none. Patient denies chest pain, claudication, dyspnea, and fatigue.  Cardiovascular risk factors: obesity (BMI >= 30 kg/m2) and sedentary lifestyle. Use of agents associated with hypertension: none. History of target organ damage: none. When question about her B/P today, states that she has not taken her medications.   Since last visit, denies any significant gout flare. States that she is able to complete ADLS without usage of assistive devices. Continues to require the assistance of her mother for everyday tasks.Denies any pain during today's visit.  Denies any other concerns today.  Past Medical History:  Diagnosis Date   Acid reflux 08/2019   Asthma    Depression    Gout    Hypertension    Stroke [facility])     Past Surgical History:  Procedure Laterality Date   LOOP RECORDER INSERTION N/A 08/19/2017   Procedure: LOOP RECORDER INSERTION;  Surgeon: Thompson Grayer, MD;  Location: [facility] LAB;  Service: Cardiovascular;  Laterality: N/A;   TEE WITHOUT CARDIOVERSION N/A 08/19/2017   Procedure: TRANSESOPHAGEAL ECHOCARDIOGRAM (TEE);  Surgeon: Fay Records, MD;  Location: [facility] ENDOSCOPY;  Service: Cardiovascular;  Laterality: N/A;    Family History  Problem Relation Age of Onset   Heart failure  Mother    Hypertension Mother    Stroke Neg Hx     Social History   Socioeconomic History   Marital status: Single    Spouse name: Not on file   Number of children: Not on file   Years of education: Not on file   Highest education level: Not on file  Occupational History   Not on file  Tobacco Use   Smoking status: Every Day    Packs/day: 0.25    Years: 7.00    Total pack years: 1.75    Types: Cigarettes   Smokeless tobacco: Never  Vaping Use   Vaping Use: Never used  Substance and Sexual Activity   Alcohol use: Yes    Comment: rare   Drug use: No   Sexual activity: Not Currently  Other Topics Concern   Not on file  Social History Narrative   Not on file   Social Determinants of Health   Financial Resource Strain: Not on file  Food Insecurity: Not on file  Transportation Needs: Not on file  Physical Activity: Not on file  Stress: Not on file  Social Connections: Not on file  Intimate Partner Violence: Not on file    Outpatient Medications Prior to Visit  Medication Sig Dispense Refill   acetaminophen (TYLENOL) 500 MG tablet Take 500 mg by mouth every 6 (six) hours as needed for moderate pain or headache.     allopurinol (ZYLOPRIM) 300 MG tablet Take 1 tablet (300 mg total) by mouth daily. 90 tablet 1  amLODipine (NORVASC) 10 MG tablet TAKE 1 TABLET BY MOUTH DAILY 30 tablet 6   colchicine 0.6 MG tablet Take 1 tablet (0.6 mg total) by mouth 2 (two) times daily as needed. 60 tablet 2   VENTOLIN HFA 108 (90 Base) MCG/ACT inhaler INHALE 2 PUFFS INTO THE LUNGS EVERY 6 HOURS AS NEEDED FOR WHEEZING OR SHORTNESS OF BREATH 18 g 2   diclofenac Sodium (VOLTAREN) 1 % GEL Apply 2 grams to affected area four times daily as needed (for musculoskeletal or joint pain) (Patient not taking: Reported on 10/23/2021) 100 g 1   ferrous sulfate 325 (65 FE) MG tablet Take 1 tab by mouth daily with breakfast (Patient not taking: Reported on 06/22/2021) 30 tablet 1   folic acid (FOLVITE) 1  MG tablet take 1 tab by mouth daily (Patient not taking: Reported on 06/22/2021) 30 tablet 1   HYDROcodone-acetaminophen (NORCO/VICODIN) 5-325 MG tablet Take 1 tablet by mouth every 4 (four) hours as needed. (Patient not taking: Reported on 06/22/2021) 10 tablet 0   predniSONE (STERAPRED UNI-PAK 21 TAB) 10 MG (21) TBPK tablet Take by mouth daily. Take once daily by mouth decreasing dose over 6 days: 6, 5, 4, 3, 2, 1 tablet per day (Patient not taking: Reported on 10/23/2021) 21 tablet 0   vitamin B-12 (CYANOCOBALAMIN) 1000 MCG tablet take 1 tab by mouth daily (Patient not taking: Reported on 04/24/2021) 30 tablet 1   No facility-administered medications prior to visit.    Allergies  Allergen Reactions   Nsaids Other (See Comments)    Likely causing AKI    Review of Systems  Constitutional:  Negative for chills, fever and malaise/fatigue.  Respiratory:  Negative for cough and shortness of breath.   Cardiovascular:  Negative for chest pain, palpitations and leg swelling.  Gastrointestinal:  Negative for abdominal pain, blood in stool, constipation, diarrhea, nausea and vomiting.  Musculoskeletal: Negative.   Skin: Negative.   Neurological: Negative.   Psychiatric/Behavioral:  Negative for depression. The patient is not nervous/anxious.   All other systems reviewed and are negative.      Objective:    Physical Exam Vitals reviewed.  Constitutional:      General: She is not in acute distress.    Appearance: Normal appearance. She is obese.  HENT:     Head: Normocephalic.  Neck:     Vascular: No carotid bruit.  Cardiovascular:     Rate and Rhythm: Normal rate and regular rhythm.     Pulses: Normal pulses.     Heart sounds: Normal heart sounds.     Comments: No obvious peripheral edema Pulmonary:     Effort: Pulmonary effort is normal.     Breath sounds: Normal breath sounds.  Musculoskeletal:        General: No swelling, tenderness, deformity or signs of injury. Normal range  of motion.     Cervical back: Normal range of motion and neck supple. No rigidity or tenderness.     Right lower leg: No edema.     Left lower leg: No edema.  Lymphadenopathy:     Cervical: No cervical adenopathy.  Skin:    General: Skin is warm and dry.     Capillary Refill: Capillary refill takes less than 2 seconds.  Neurological:     General: No focal deficit present.     Mental Status: She is alert and oriented to person, place, and time.  Psychiatric:        Mood and Affect: Mood normal.  Behavior: Behavior normal.        Thought Content: Thought content normal.        Judgment: Judgment normal.     BP (!) 121/101   Pulse 79   Temp (!) 97.4 F (36.3 C)   Ht '5\' 4"'  (1.626 m)   Wt 179 lb 3.2 oz (81.3 kg)   SpO2 98%   BMI 30.76 kg/m  Wt Readings from Last 3 Encounters:  12/12/21 179 lb 3.2 oz (81.3 kg)  10/23/21 177 lb (80.3 kg)  07/25/21 197 lb (89.4 kg)     There is no immunization history on file for this patient.  Diabetic Foot Exam - Simple   No data filed     Lab Results  Component Value Date   TSH 0.658 11/15/2019   Lab Results  Component Value Date   WBC 5.6 12/12/2021   HGB 11.3 12/12/2021   HCT 35.3 12/12/2021   MCV 78 (L) 12/12/2021   PLT 346 12/12/2021   Lab Results  Component Value Date   NA 137 12/12/2021   K 4.9 12/12/2021   CO2 21 12/12/2021   GLUCOSE 79 12/12/2021   BUN 15 12/12/2021   CREATININE 1.09 (H) 12/12/2021   BILITOT 0.3 12/12/2021   ALKPHOS 56 12/12/2021   AST 20 12/12/2021   ALT 11 12/12/2021   PROT 7.4 12/12/2021   ALBUMIN 4.4 12/12/2021   CALCIUM 9.7 12/12/2021   ANIONGAP 9 04/04/2021   EGFR 65 12/12/2021   Lab Results  Component Value Date   CHOL 207 (H) 12/12/2021   CHOL 117 04/04/2021   CHOL CANCELED 04/04/2021   Lab Results  Component Value Date   HDL 138 12/12/2021   HDL 38 (L) 04/04/2021   HDL CANCELED 04/04/2021   Lab Results  Component Value Date   LDLCALC 60 12/12/2021   LDLCALC 67  04/04/2021   LDLCALC 67 11/15/2019   Lab Results  Component Value Date   TRIG 45 12/12/2021   TRIG 61 04/04/2021   TRIG CANCELED 04/04/2021   Lab Results  Component Value Date   CHOLHDL 1.5 12/12/2021   CHOLHDL 3.1 04/04/2021   CHOLHDL 2.0 11/15/2019   Lab Results  Component Value Date   HGBA1C 5.0 11/15/2019   HGBA1C 5.0 11/15/2019   HGBA1C 5.0 (A) 11/15/2019   HGBA1C 5.0 11/15/2019       Assessment & Plan:   Problem List Items Addressed This Visit       Musculoskeletal and Integument   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)   Inflammatory arthritis Maintain existing treatment plan  Encouraged to maintain follow up with specialist Encouraged continued diet and exercise efforts  Encouraged continued compliance with medication     Other Visit Diagnoses     Encounter for wellness examination in adult       Relevant Orders   CBC with Differential/Platelet (Completed)   Comprehensive metabolic panel (Completed)   Lipid panel (Completed) Encouraged continued diet and exercise efforts  Encouraged continued compliance with medication     Follow up in 6 mths for wellness visit, sooner as needed     I have discontinued Lua Geisel's diclofenac Sodium, folic acid, vitamin X-64, ferrous sulfate, HYDROcodone-acetaminophen, and predniSONE. I am also having her maintain her acetaminophen, amLODipine, Ventolin HFA, allopurinol, and colchicine.  No orders of the defined types were placed in this encounter.    Teena Dunk, NP

## 2021-12-13 LAB — LIPID PANEL
Chol/HDL Ratio: 1.5 ratio (ref 0.0–4.4)
Cholesterol, Total: 207 mg/dL — ABNORMAL HIGH (ref 100–199)
HDL: 138 mg/dL (ref >39)
LDL Chol Calc (NIH): 60 mg/dL (ref 0–99)
Triglycerides: 45 mg/dL (ref 0–149)
VLDL Cholesterol Cal: 9 mg/dL (ref 5–40)

## 2021-12-13 LAB — CBC WITH DIFFERENTIAL/PLATELET
Basophils Absolute: 0 10*3/uL (ref 0.0–0.2)
Basos: 1 %
EOS (ABSOLUTE): 0.3 10*3/uL (ref 0.0–0.4)
Eos: 6 %
Hematocrit: 35.3 % (ref 34.0–46.6)
Hemoglobin: 11.3 g/dL (ref 11.1–15.9)
Immature Grans (Abs): 0 10*3/uL (ref 0.0–0.1)
Immature Granulocytes: 0 %
Lymphocytes Absolute: 1.4 10*3/uL (ref 0.7–3.1)
Lymphs: 25 %
MCH: 25.1 pg — ABNORMAL LOW (ref 26.6–33.0)
MCHC: 32 g/dL (ref 31.5–35.7)
MCV: 78 fL — ABNORMAL LOW (ref 79–97)
Monocytes Absolute: 0.5 10*3/uL (ref 0.1–0.9)
Monocytes: 8 %
Neutrophils Absolute: 3.4 10*3/uL (ref 1.4–7.0)
Neutrophils: 60 %
Platelets: 346 10*3/uL (ref 150–450)
RBC: 4.51 x10E6/uL (ref 3.77–5.28)
RDW: 14.7 % (ref 11.7–15.4)
WBC: 5.6 10*3/uL (ref 3.4–10.8)

## 2021-12-13 LAB — COMPREHENSIVE METABOLIC PANEL
ALT: 11 IU/L (ref 0–32)
AST: 20 IU/L (ref 0–40)
Albumin/Globulin Ratio: 1.5 (ref 1.2–2.2)
Albumin: 4.4 g/dL (ref 3.8–4.8)
Alkaline Phosphatase: 56 IU/L (ref 44–121)
BUN/Creatinine Ratio: 14 (ref 9–23)
BUN: 15 mg/dL (ref 6–24)
Bilirubin Total: 0.3 mg/dL (ref 0.0–1.2)
CO2: 21 mmol/L (ref 20–29)
Calcium: 9.7 mg/dL (ref 8.7–10.2)
Chloride: 104 mmol/L (ref 96–106)
Creatinine, Ser: 1.09 mg/dL — ABNORMAL HIGH (ref 0.57–1.00)
Globulin, Total: 3 g/dL (ref 1.5–4.5)
Glucose: 79 mg/dL (ref 70–99)
Potassium: 4.9 mmol/L (ref 3.5–5.2)
Sodium: 137 mmol/L (ref 134–144)
Total Protein: 7.4 g/dL (ref 6.0–8.5)
eGFR: 65 mL/min/{1.73_m2} (ref >59)

## 2022-01-02 ENCOUNTER — Telehealth: Payer: Self-pay

## 2022-01-02 NOTE — Telephone Encounter (Signed)
No additional notes needed  

## 2022-01-03 ENCOUNTER — Other Ambulatory Visit: Payer: Self-pay

## 2022-01-03 ENCOUNTER — Emergency Department (HOSPITAL_COMMUNITY)
Admission: EM | Admit: 2022-01-03 | Discharge: 2022-01-03 | Disposition: A | Discharge status: Home / Self Care | Payer: Medicaid Other | Attending: Emergency Medicine | Admitting: Emergency Medicine

## 2022-01-03 ENCOUNTER — Telehealth: Payer: Self-pay | Admitting: Internal Medicine

## 2022-01-03 ENCOUNTER — Encounter (HOSPITAL_COMMUNITY): Payer: Self-pay

## 2022-01-03 DIAGNOSIS — M10072 Idiopathic gout, left ankle and foot: Secondary | ICD-10-CM | POA: Diagnosis not present

## 2022-01-03 DIAGNOSIS — M109 Gout, unspecified: Secondary | ICD-10-CM

## 2022-01-03 DIAGNOSIS — M25572 Pain in left ankle and joints of left foot: Secondary | ICD-10-CM | POA: Diagnosis present

## 2022-01-03 LAB — CBC WITH DIFFERENTIAL/PLATELET
Abs Immature Granulocytes: 0.02 10*3/uL (ref 0.00–0.07)
Basophils Absolute: 0 10*3/uL (ref 0.0–0.1)
Basophils Relative: 1 %
Eosinophils Absolute: 0.2 10*3/uL (ref 0.0–0.5)
Eosinophils Relative: 3 %
HCT: 33.4 % — ABNORMAL LOW (ref 36.0–46.0)
Hemoglobin: 10.7 g/dL — ABNORMAL LOW (ref 12.0–15.0)
Immature Granulocytes: 0 %
Lymphocytes Relative: 16 %
Lymphs Abs: 1.3 10*3/uL (ref 0.7–4.0)
MCH: 25.9 pg — ABNORMAL LOW (ref 26.0–34.0)
MCHC: 32 g/dL (ref 30.0–36.0)
MCV: 80.9 fL (ref 80.0–100.0)
Monocytes Absolute: 0.7 10*3/uL (ref 0.1–1.0)
Monocytes Relative: 9 %
Neutro Abs: 6 10*3/uL (ref 1.7–7.7)
Neutrophils Relative %: 71 %
Platelets: 288 10*3/uL (ref 150–400)
RBC: 4.13 MIL/uL (ref 3.87–5.11)
RDW: 15.1 % (ref 11.5–15.5)
WBC: 8.3 10*3/uL (ref 4.0–10.5)
nRBC: 0 % (ref 0.0–0.2)

## 2022-01-03 LAB — BASIC METABOLIC PANEL
Anion gap: 8 (ref 5–15)
BUN: 15 mg/dL (ref 6–20)
CO2: 21 mmol/L — ABNORMAL LOW (ref 22–32)
Calcium: 9.4 mg/dL (ref 8.9–10.3)
Chloride: 109 mmol/L (ref 98–111)
Creatinine, Ser: 1.13 mg/dL — ABNORMAL HIGH (ref 0.44–1.00)
GFR, Estimated: >60 mL/min (ref >60)
Glucose, Bld: 87 mg/dL (ref 70–99)
Potassium: 3.9 mmol/L (ref 3.5–5.1)
Sodium: 138 mmol/L (ref 135–145)

## 2022-01-03 MED ORDER — OXYCODONE-ACETAMINOPHEN 5-325 MG PO TABS
1.0000 | ORAL_TABLET | Freq: Once | ORAL | Status: AC
  Administered 2022-01-03: 1 via ORAL
  Filled 2022-01-03: qty 1

## 2022-01-03 MED ORDER — PREDNISONE 20 MG PO TABS
60.0000 mg | ORAL_TABLET | Freq: Once | ORAL | Status: AC
  Administered 2022-01-03: 60 mg via ORAL
  Filled 2022-01-03: qty 3

## 2022-01-03 MED ORDER — PREDNISONE 20 MG PO TABS: ORAL_TABLET | ORAL | 0 refills | Status: DC

## 2022-01-03 NOTE — Telephone Encounter (Signed)
Patient called requesting prescription refill of Prednisone to be sent to [facility] at [address].  Patient states she is experiencing pain and swelling in her feet, knees, and legs.

## 2022-01-03 NOTE — Discharge Instructions (Addendum)
Your knee and ankle pain is likely due to a gout flare.  Please hold off on using allopurinol during this time as it may exacerbate your symptoms. You may resume taking allopurinol once your gout flare resolve. Take prednisone as prescribed, continue to taking Tylenol and colchicine for symptom control.  Follow-up with your doctor for further care.

## 2022-01-03 NOTE — ED Provider Triage Note (Signed)
Emergency Medicine Provider Triage Evaluation Note  Suzanne Graham , a 41 y.o. female  was evaluated in triage.  Pt complains of pain in the left knee and left forefoot.  Patient states pain started without injury approximately 2 days ago.  Patient has history of gout states this feels similar to previous gout flares.  Patient states she tried to contact primary care provider but he is out of the office.  Patient states that previously she has been given prednisone which has helped get her symptoms under control.  Review of Systems  Positive: Left foot and knee pain Negative: Injury  Physical Exam  Ht 5\' 4"  (1.626 m)   Wt 92.1 kg   LMP 12/01/2021 (Approximate)   BMI 34.84 kg/m  Gen:   Awake, no distress   Resp:  Normal effort  MSK:   Moves extremities without difficulty  Other:    Medical Decision Making  Medically screening exam initiated at 3:32 PM.  Appropriate orders placed.  Suzanne Graham was informed that the remainder of the evaluation will be completed by another provider, this initial triage assessment does not replace that evaluation, and the importance of remaining in the ED until their evaluation is complete.     Suzanne Graham, Suzanne Graham 01/03/22 1533

## 2022-01-03 NOTE — ED Triage Notes (Signed)
Pt with hx of gout presents with pain and swelling to left foot and left knee, states she takes Allopurinol and Colchicine

## 2022-01-03 NOTE — ED Provider Notes (Signed)
[facility] EMERGENCY DEPARTMENT Provider Note   CSN: [id number] Arrival date & time: 01/03/22  1522     History  Chief Complaint  Patient presents with   Foot Pain    Suzanne Graham is a 41 y.o. female.  The history is provided by the patient and medical records. No language interpreter was used.  Foot Pain    41 year old female significant history of gout presenting complaining of joint pain.  Patient report for the past 2 days she has noticed increasing pain to her left knee and left ankle/foot.  Pain is sharp throbbing achy felt very similar to prior gouty flare that she has had in the past.  She tries taking Tylenol at home without adequate relief.  She mention prednisone in the past seems to help.  She wanted to follow-up with her PCP but he is not available at this time.  She is currently taking allopurinol and colchicine for her symptoms.  She denies any recent trauma and denies having fever.  No back pain.  Home Medications Prior to Admission medications   Medication Sig Start Date End Date Taking? Authorizing Provider  acetaminophen (TYLENOL) 500 MG tablet Take 500 mg by mouth every 6 (six) hours as needed for moderate pain or headache.    [provider]  allopurinol (ZYLOPRIM) 300 MG tablet Take 1 tablet (300 mg total) by mouth daily. 10/24/21   Rice, Jamesetta Orleans, MD  amLODipine (NORVASC) 10 MG tablet TAKE 1 TABLET BY MOUTH DAILY 05/23/21 05/23/22  Orion Crook I, NP  colchicine 0.6 MG tablet Take 1 tablet (0.6 mg total) by mouth 2 (two) times daily as needed. 10/24/21 02/21/22  Rice, Jamesetta Orleans, MD  VENTOLIN HFA 108 (90 Base) MCG/ACT inhaler INHALE 2 PUFFS INTO THE LUNGS EVERY 6 HOURS AS NEEDED FOR WHEEZING OR SHORTNESS OF BREATH 10/16/21   Orion Crook I, NP      Allergies    Nsaids    Review of Systems   Review of Systems  All other systems reviewed and are negative.   Physical Exam Updated Vital Signs BP (!) 132/95 (BP  Location: Right Arm)   Pulse 86   Temp 99 F (37.2 C) (Oral)   Resp 18   Ht 5\' 4"  (1.626 m)   Wt 92.1 kg   LMP 12/01/2021 (Approximate)   SpO2 100%   BMI 34.84 kg/m  Physical Exam Vitals and nursing note reviewed.  Constitutional:      General: She is not in acute distress.    Appearance: She is well-developed.  HENT:     Head: Atraumatic.  Eyes:     Conjunctiva/sclera: Conjunctivae normal.  Pulmonary:     Effort: Pulmonary effort is normal.  Musculoskeletal:        General: Tenderness (Left lower extremity: Edema and tenderness noted to left ankle and dorsum of foot with decreased range of motion.  Tenderness to left knee without edema.  Leg compartment soft) present.     Cervical back: Neck supple.  Skin:    Findings: No rash.  Neurological:     Mental Status: She is alert.  Psychiatric:        Mood and Affect: Mood normal.     ED Results / Procedures / Treatments   Labs (all labs ordered are listed, but only abnormal results are displayed) Labs Reviewed  BASIC METABOLIC PANEL - Abnormal; Notable for the following components:      Result Value   CO2 21 (*)  Creatinine, Ser 1.13 (*)    All other components within normal limits  CBC WITH DIFFERENTIAL/PLATELET - Abnormal; Notable for the following components:   Hemoglobin 10.7 (*)    HCT 33.4 (*)    MCH 25.9 (*)    All other components within normal limits    EKG None  Radiology No results found.  Procedures Procedures    Medications Ordered in ED Medications - No data to display  ED Course/ Medical Decision Making/ A&P                           Medical Decision Making  BP (!) 132/95 (BP Location: Right Arm)   Pulse 86   Temp 99 F (37.2 C) (Oral)   Resp 18   Ht 5\' 4"  (1.626 m)   Wt 92.1 kg   LMP 12/01/2021 (Approximate)   SpO2 100%   BMI 34.84 kg/m   6:50 PM This is a 41 year old female significant history of recurrent gout flare presenting complaining of left knee and left ankle/foot  pain ongoing for the past 2 days.  Pain felt very similar to prior gouty flare that she has had in the past.  She denies any trauma she denies any fever she denies any back pain.  She tries taking Tylenol along with allopurinol and colchicine at home without adequate relief.  On exam, she does have edema and tenderness noted to the dorsum of her left foot/ankle as well as tenderness to her left knee.  I suspect this is consistent with gout I have low suspicion for septic joint or cellulitis.  Low suspicion for fracture or dislocation.  Have considered obtaining x-ray but due to lack of trauma I felt x-ray is low yield.  Plan to discharge home with burst and taper steroid.  Recommend patient to stop using allopurinol while she is having a gouty flare as it may exacerbate symptoms.  She should follow-up with her PCP for further care.        Final Clinical Impression(s) / ED Diagnoses Final diagnoses:  Acute gout of left ankle, unspecified cause    Rx / DC Orders ED Discharge Orders          Ordered    predniSONE (DELTASONE) 20 MG tablet        01/03/22 1917              01/05/22, PA-C 01/03/22 1919    01/05/22, MD 01/04/22 1028

## 2022-01-07 NOTE — Telephone Encounter (Signed)
I called patient, patient is improving.  

## 2022-01-07 NOTE — Telephone Encounter (Signed)
Sorry to hear about her flare up. It appears she saw the ED the same day Thursday who were able to take a look and prescribed 4 days of prednisone that she should be finishing now. Have her symptoms improved significantly? If so she can resume her allopurinol, if not we can continue the taper several days.

## 2022-01-09 ENCOUNTER — Encounter (HOSPITAL_COMMUNITY): Payer: Self-pay | Admitting: Emergency Medicine

## 2022-01-09 ENCOUNTER — Emergency Department (HOSPITAL_COMMUNITY): Payer: Medicaid Other

## 2022-01-09 ENCOUNTER — Emergency Department (HOSPITAL_COMMUNITY)
Admission: EM | Admit: 2022-01-09 | Discharge: 2022-01-09 | Disposition: A | Discharge status: Home / Self Care | Payer: Medicaid Other | Attending: Emergency Medicine | Admitting: Emergency Medicine

## 2022-01-09 DIAGNOSIS — R0781 Pleurodynia: Secondary | ICD-10-CM | POA: Insufficient documentation

## 2022-01-09 DIAGNOSIS — R079 Chest pain, unspecified: Secondary | ICD-10-CM | POA: Diagnosis present

## 2022-01-09 DIAGNOSIS — J45909 Unspecified asthma, uncomplicated: Secondary | ICD-10-CM | POA: Diagnosis not present

## 2022-01-09 DIAGNOSIS — N189 Chronic kidney disease, unspecified: Secondary | ICD-10-CM | POA: Insufficient documentation

## 2022-01-09 DIAGNOSIS — Z79899 Other long term (current) drug therapy: Secondary | ICD-10-CM | POA: Diagnosis not present

## 2022-01-09 DIAGNOSIS — I129 Hypertensive chronic kidney disease with stage 1 through stage 4 chronic kidney disease, or unspecified chronic kidney disease: Secondary | ICD-10-CM | POA: Insufficient documentation

## 2022-01-09 MED ORDER — METHOCARBAMOL 500 MG PO TABS: 500.0000 mg | ORAL_TABLET | Freq: Three times a day (TID) | ORAL | 0 refills | Status: DC | PRN

## 2022-01-09 MED ORDER — HYDROCODONE-ACETAMINOPHEN 5-325 MG PO TABS
1.0000 | ORAL_TABLET | Freq: Once | ORAL | Status: AC
  Administered 2022-01-09: 1 via ORAL
  Filled 2022-01-09: qty 1

## 2022-01-09 MED ORDER — LIDOCAINE 5 % EX PTCH
1.0000 | MEDICATED_PATCH | CUTANEOUS | Status: DC
Start: 2022-01-09 — End: 2022-01-09
  Administered 2022-01-09: 1 via TRANSDERMAL
  Filled 2022-01-09: qty 1

## 2022-01-09 MED ORDER — LIDOCAINE 5 % EX PTCH: 1.0000 | MEDICATED_PATCH | Freq: Every day | CUTANEOUS | 0 refills | Status: DC | PRN
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
## 2022-01-09 NOTE — ED Notes (Signed)
Patient transported to X-ray 
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
## 2022-01-09 NOTE — Discharge Instructions (Addendum)
You were seen in the emergency department today for rib/chest pain after injury.  Your x-ray does not show any fractures at this time.  We are sending you home the following medicines to help with your pain:  - Robaxin- this is the muscle relaxer I have prescribed, this is meant to help with muscle tightness/spasms. Be aware that this medication may make you drowsy therefore the first time you take this it should be at a time you are in an environment where you can rest. Do not drive or operate heavy machinery when taking this medication. Do not drink alcohol or take other sedating medications with this medicine such as narcotics or benzodiazepines.   - Lidoderm patch- Apply 1 patch to your area of most significant pain once per day to help numb/soothe this area. Remove & discard patch within 12 hours of application.  Do not apply heat over top of the patches.   You make take Tylenol per over the counter dosing with these medications.   We have prescribed you new medication(s) today. Discuss the medications prescribed today with your pharmacist as they can have adverse effects and interactions with your other medicines including over the counter and prescribed medications. Seek medical evaluation if you start to experience new or abnormal symptoms after taking one of these medicines, seek care immediately if you start to experience difficulty breathing, feeling of your throat closing, facial swelling, or rash as these could be indications of a more serious allergic reaction  We are also sending you home with an incentive spirometer, this is something we would like you to breathe into as demonstrated in the emergency department every hour while awake.  This is to ensure that your lungs are expanding after chest x-ray is showing some findings concerning that you are not taking good deep breaths.  Please follow with your primary care provider within 3 days.  Return to the emergency department for new or  worsening symptoms including but not limited to new or worsening pain, coughing up thick sputum, coughing up blood, fever, trouble breathing, or any other concerns.

## 2022-01-15 ENCOUNTER — Other Ambulatory Visit: Payer: Self-pay | Admitting: Family Medicine

## 2022-01-15 ENCOUNTER — Telehealth: Payer: Self-pay

## 2022-01-15 DIAGNOSIS — I10 Essential (primary) hypertension: Secondary | ICD-10-CM

## 2022-01-15 MED ORDER — AMLODIPINE BESYLATE 10 MG PO TABS: ORAL_TABLET | Freq: Every day | ORAL | 6 refills | Status: DC

## 2022-01-15 NOTE — Progress Notes (Signed)
Meds ordered this encounter  Medications   amLODipine (NORVASC) 10 MG tablet    Sig: TAKE 1 TABLET BY MOUTH DAILY    Dispense:  30 tablet    Refill:  6    Order Specific Question:   Supervising Provider    Answer:   Quentin Angst [[id number]]   Nolon Nations  APRN, MSN, FNP-C [facility] [address] [place], [place] [address] [phone]

## 2022-01-15 NOTE — Telephone Encounter (Signed)
BP med to be refilled 

## 2022-01-29 ENCOUNTER — Other Ambulatory Visit: Payer: Self-pay

## 2022-01-29 DIAGNOSIS — R0602 Shortness of breath: Secondary | ICD-10-CM

## 2022-02-04 NOTE — Progress Notes (Deleted)
Office Visit Note  Patient: Suzanne Graham             Date of Birth: 1981-05-19           MRN: [id number]             PCP: Kathrynn Speed, NP Referring: Kathrynn Speed, NP Visit Date: 02/13/2022   Subjective:  No chief complaint on file.   History of Present Illness: Suzanne Graham is a 41 y.o. female here for follow up for gout.   Previous HPI 10/23/2021  Suzanne Graham is a 41 y.o. female here for follow up for gout on allopurinol 200 mg daily and colchicine. She is generally doing well she has some mild upper respiratory symptoms she attributes to seasonal allergies. Within the past week new pain in both feet started on the left foot on dorsal and lateral side and within past 2 days started having pain in similar area of the right foot. She recalls doing a lot of walking in possible wrong sized shoes but not sure about any other preceding event.    Previous HPI 07/25/21 Suzanne Graham is a 41 y.o. 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  After our last visit her symptoms improved quickly with the short prednisone taper but then returned again.  Right now having the most trouble with pain and swelling throughout her foot and ankle.  She is not experience any trouble taking the allopurinol or colchicine.   No Rheumatology ROS completed.   PMFS History:  Patient Active Problem List   Diagnosis Date Noted   High risk medication use 06/22/2021   Systemic inflammatory response syndrome (HCC) 03/31/2021   Inflammatory arthritis 03/29/2021   CKD (chronic kidney disease), stage III (HCC) 03/29/2021   Microcytic anemia 03/29/2021   AKI (acute kidney injury) (HCC) 03/11/2021   Adverse reaction to non-steroidal anti-inflammatory drug (NSAID), initial encounter 03/11/2021   Vitamin D deficiency 11/17/2019   Chronic gout of foot 10/11/2018    Current smoker 10/11/2018   Generalized pain 10/11/2018   Chronic nonintractable headache 08/06/2017   Essential hypertension 08/06/2017   Stroke (HCC) 07/17/2017    Past Medical History:  Diagnosis Date   Acid reflux 08/2019   Asthma    Depression    Gout    Hypertension    Stroke (HCC)     Family History  Problem Relation Age of Onset   Heart failure Mother    Hypertension Mother    Stroke Neg Hx    Past Surgical History:  Procedure Laterality Date   LOOP RECORDER INSERTION N/A 08/19/2017   Procedure: LOOP RECORDER INSERTION;  Surgeon: Hillis Range, MD;  Location: [facility];  Service: Cardiovascular;  Laterality: N/A;   TEE WITHOUT CARDIOVERSION N/A 08/19/2017   Procedure: TRANSESOPHAGEAL ECHOCARDIOGRAM (TEE);  Surgeon: Pricilla Riffle, MD;  Location: [facility] ENDOSCOPY;  Service: Cardiovascular;  Laterality: N/A;   Social History   Social History Narrative   Not on file    There is no immunization history on file for this patient.   Objective: Vital Signs: There were no vitals taken for this visit.   Physical Exam   Musculoskeletal Exam: ***  CDAI Exam: CDAI Score: -- Patient Global: --; Provider Global: -- Swollen: --; Tender: -- Joint Exam 02/13/2022   No joint exam has been documented for this visit  There is currently no information documented on the homunculus. Go to the Rheumatology activity and complete the homunculus joint exam.  Investigation: No additional findings.  Imaging: DG Ribs Unilateral W/Chest Left  Result Date: 01/09/2022 CLINICAL DATA:  Status post fall few days ago with left rib pain. EXAM: LEFT RIBS AND CHEST - 3+ VIEW COMPARISON:  March 29, 2021 FINDINGS: No fracture or other bone lesions are seen involving the ribs. There is no evidence of pneumothorax or pleural effusion. Mild linear atelectasis of the inferolateral left lung base is identified. Heart size and mediastinal contours are within normal limits. IMPRESSION: No  acute fracture or dislocation. Mild linear atelectasis of the inferolateral left lung base. Electronically Signed   By: Sherian Rein M.D.   On: 01/09/2022 10:25    Recent Labs: Lab Results  Component Value Date   WBC 8.3 01/03/2022   HGB 10.7 (L) 01/03/2022   PLT 288 01/03/2022   NA 138 01/03/2022   K 3.9 01/03/2022   CL 109 01/03/2022   CO2 21 (L) 01/03/2022   GLUCOSE 87 01/03/2022   BUN 15 01/03/2022   CREATININE 1.13 (H) 01/03/2022   BILITOT 0.3 12/12/2021   ALKPHOS 56 12/12/2021   AST 20 12/12/2021   ALT 11 12/12/2021   PROT 7.4 12/12/2021   ALBUMIN 4.4 12/12/2021   CALCIUM 9.4 01/03/2022   GFRAA 72 11/15/2019   QFTBGOLDPLUS NEGATIVE 06/22/2021    Speciality Comments: No specialty comments available.  Procedures:  No procedures performed Allergies: Nsaids   Assessment / Plan:     Visit Diagnoses: No diagnosis found.  ***  Orders: No orders of the defined types were placed in this encounter.  No orders of the defined types were placed in this encounter.    Follow-Up Instructions: No follow-ups on file.   Jairo Ben, RT  Note - This record has been created using Animal nutritionist.  Chart creation errors have been sought, but may not always  have been located. Such creation errors do not reflect on  the standard of medical care.

## 2022-02-13 ENCOUNTER — Ambulatory Visit: Payer: Medicaid Other | Admitting: Internal Medicine

## 2022-02-13 DIAGNOSIS — M10471 Other secondary gout, right ankle and foot: Secondary | ICD-10-CM

## 2022-02-13 DIAGNOSIS — Z79899 Other long term (current) drug therapy: Secondary | ICD-10-CM

## 2022-02-13 DIAGNOSIS — M1A09X0 Idiopathic chronic gout, multiple sites, without tophus (tophi): Secondary | ICD-10-CM

## 2022-02-13 DIAGNOSIS — N1831 Chronic kidney disease, stage 3a: Secondary | ICD-10-CM

## 2022-02-13 DIAGNOSIS — M1A3790 Chronic gout due to renal impairment, unspecified ankle and foot, without tophus (tophi): Secondary | ICD-10-CM

## 2022-04-19 ENCOUNTER — Other Ambulatory Visit: Payer: Self-pay | Admitting: Internal Medicine

## 2022-04-19 DIAGNOSIS — M1A0790 Idiopathic chronic gout, unspecified ankle and foot, without tophus (tophi): Secondary | ICD-10-CM

## 2022-06-13 ENCOUNTER — Ambulatory Visit: Payer: Medicaid Other | Admitting: Nurse Practitioner

## 2022-08-28 ENCOUNTER — Other Ambulatory Visit: Payer: Self-pay | Admitting: Internal Medicine

## 2022-08-28 DIAGNOSIS — M1A0790 Idiopathic chronic gout, unspecified ankle and foot, without tophus (tophi): Secondary | ICD-10-CM

## 2022-09-12 ENCOUNTER — Other Ambulatory Visit: Payer: Self-pay

## 2022-09-12 ENCOUNTER — Emergency Department (HOSPITAL_COMMUNITY): Payer: Medicaid Other

## 2022-09-12 ENCOUNTER — Encounter (HOSPITAL_COMMUNITY): Payer: Self-pay

## 2022-09-12 ENCOUNTER — Emergency Department (HOSPITAL_COMMUNITY)
Admission: EM | Admit: 2022-09-12 | Discharge: 2022-09-12 | Disposition: A | Discharge status: Home / Self Care | Payer: Medicaid Other | Attending: Emergency Medicine | Admitting: Emergency Medicine

## 2022-09-12 ENCOUNTER — Telehealth: Payer: Self-pay | Admitting: Nurse Practitioner

## 2022-09-12 DIAGNOSIS — I10 Essential (primary) hypertension: Secondary | ICD-10-CM | POA: Insufficient documentation

## 2022-09-12 DIAGNOSIS — J45909 Unspecified asthma, uncomplicated: Secondary | ICD-10-CM | POA: Insufficient documentation

## 2022-09-12 DIAGNOSIS — L03213 Periorbital cellulitis: Secondary | ICD-10-CM

## 2022-09-12 DIAGNOSIS — Z79899 Other long term (current) drug therapy: Secondary | ICD-10-CM | POA: Diagnosis not present

## 2022-09-12 DIAGNOSIS — J320 Chronic maxillary sinusitis: Secondary | ICD-10-CM | POA: Diagnosis not present

## 2022-09-12 DIAGNOSIS — M7911 Myalgia of mastication muscle: Secondary | ICD-10-CM | POA: Diagnosis not present

## 2022-09-12 DIAGNOSIS — R6884 Jaw pain: Secondary | ICD-10-CM | POA: Diagnosis present

## 2022-09-12 LAB — BASIC METABOLIC PANEL
Anion gap: 10 (ref 5–15)
BUN: 16 mg/dL (ref 6–20)
CO2: 21 mmol/L — ABNORMAL LOW (ref 22–32)
Calcium: 9 mg/dL (ref 8.9–10.3)
Chloride: 102 mmol/L (ref 98–111)
Creatinine, Ser: 1.07 mg/dL — ABNORMAL HIGH (ref 0.44–1.00)
GFR, Estimated: >60 mL/min (ref >60)
Glucose, Bld: 86 mg/dL (ref 70–99)
Potassium: 4.7 mmol/L (ref 3.5–5.1)
Sodium: 133 mmol/L — ABNORMAL LOW (ref 135–145)

## 2022-09-12 LAB — CBC WITH DIFFERENTIAL/PLATELET
Abs Immature Granulocytes: 0.04 10*3/uL (ref 0.00–0.07)
Basophils Absolute: 0 10*3/uL (ref 0.0–0.1)
Basophils Relative: 1 %
Eosinophils Absolute: 0.1 10*3/uL (ref 0.0–0.5)
Eosinophils Relative: 1 %
HCT: 33 % — ABNORMAL LOW (ref 36.0–46.0)
Hemoglobin: 10.9 g/dL — ABNORMAL LOW (ref 12.0–15.0)
Immature Granulocytes: 1 %
Lymphocytes Relative: 13 %
Lymphs Abs: 0.9 10*3/uL (ref 0.7–4.0)
MCH: 25.8 pg — ABNORMAL LOW (ref 26.0–34.0)
MCHC: 33 g/dL (ref 30.0–36.0)
MCV: 78.2 fL — ABNORMAL LOW (ref 80.0–100.0)
Monocytes Absolute: 0.5 10*3/uL (ref 0.1–1.0)
Monocytes Relative: 7 %
Neutro Abs: 5.4 10*3/uL (ref 1.7–7.7)
Neutrophils Relative %: 77 %
Platelets: 218 10*3/uL (ref 150–400)
RBC: 4.22 MIL/uL (ref 3.87–5.11)
RDW: 15.8 % — ABNORMAL HIGH (ref 11.5–15.5)
WBC: 6.9 10*3/uL (ref 4.0–10.5)
nRBC: 0 % (ref 0.0–0.2)

## 2022-09-12 LAB — I-STAT BETA HCG BLOOD, ED (MC, WL, AP ONLY): I-stat hCG, quantitative: <5 m[IU]/mL (ref <5)

## 2022-09-12 MED ORDER — IOHEXOL 350 MG/ML SOLN
75.0000 mL | Freq: Once | INTRAVENOUS | Status: AC | PRN
  Administered 2022-09-12: 75 mL via INTRAVENOUS

## 2022-09-12 MED ORDER — CLINDAMYCIN HCL 150 MG PO CAPS
300.0000 mg | ORAL_CAPSULE | Freq: Once | ORAL | Status: AC
Start: 2022-09-12 — End: 2022-09-12
  Administered 2022-09-12: 300 mg via ORAL
  Filled 2022-09-12: qty 2

## 2022-09-12 MED ORDER — CLINDAMYCIN HCL 300 MG PO CAPS: 300.0000 mg | ORAL_CAPSULE | Freq: Three times a day (TID) | ORAL | 0 refills | Status: DC

## 2022-09-12 NOTE — Telephone Encounter (Signed)
Gaut med's to be refilled

## 2022-09-12 NOTE — ED Triage Notes (Signed)
'  Pt came in via POV d/t bottom Rt Dental pain, she woke up this morning & her bottom Rt jaw was swollen, denies pain or fever. Pt reports this has happened before, no drainage from the area at this time.

## 2022-09-12 NOTE — ED Provider Triage Note (Signed)
Emergency Medicine Provider Triage Evaluation Note  Suzanne Graham , a 42 y.o. female  was evaluated in triage.  Pt complains of right lower jaw facial swelling.  3 weeks ago she had the same, it burst and she had drainage.  It started to be swelling this morning this is aggressively worsening.  She denies any fevers but has pain with chewing..  Review of Systems  Per HPI  Physical Exam  BP (!) 132/99 (BP Location: Left Arm)   Pulse 85   Temp 99.7 F (37.6 C) (Oral)   Resp 20   Ht '5\' 4"'$  (1.626 m)   SpO2 100%   BMI 34.84 kg/m  Gen:   Awake, no distress   Resp:  Normal effort  MSK:   Moves extremities without difficulty  Other:  No tenderness over percussion lower jaw, significant swelling right lower jaw with tenderness to palpation, no uvular swelling, sublingual tenderness or swelling.  Medical Decision Making  Medically screening exam initiated at 12:15 PM.  Appropriate orders placed.  Latoyya Shain was informed that the remainder of the evaluation will be completed by another provider, this initial triage assessment does not replace that evaluation, and the importance of remaining in the ED until their evaluation is complete.     Sherrill Raring, PA-C 09/12/22 1216

## 2022-09-12 NOTE — ED Provider Notes (Signed)
[facility] Provider Note   CSN: [id number] Arrival date & time: 09/12/22  1139     History  Chief Complaint  Patient presents with   Dental Pain   Facial Swelling     Suzanne Graham is a 42 y.o. female.  steub   Dental Pain    This is a 42 year old female with history of hypertension, depression, asthma, GERD presenting to the emergency department due to right lower jaw pain.  Patient states 3 weeks ago she had an abscess to the external right lower jaw, this burst on its own and drained.  It recollected this morning, its associated with significant pain and facial swelling which has been progressing over the day.  Denies any difficulty swallowing, fevers at home, dental pain.  Home Medications Prior to Admission medications   Medication Sig Start Date End Date Taking? Authorizing Provider  clindamycin (CLEOCIN) 300 MG capsule Take 1 capsule (300 mg total) by mouth 3 (three) times daily. 09/12/22  Yes Sherrill Raring, PA-C  acetaminophen (TYLENOL) 500 MG tablet Take 500 mg by mouth every 6 (six) hours as needed for moderate pain or headache.    [provider]  allopurinol (ZYLOPRIM) 300 MG tablet Take 1 tablet (300 mg total) by mouth daily. 10/24/21   Collier Salina, MD  amLODipine (NORVASC) 10 MG tablet TAKE 1 TABLET BY MOUTH DAILY 01/15/22 01/15/23  Dorena Dew, FNP  colchicine 0.6 MG tablet Take 1 tablet (0.6 mg total) by mouth 2 (two) times daily as needed. 10/24/21 02/21/22  Rice, Resa Miner, MD  lidocaine (LIDODERM) 5 % Place 1 patch onto the skin daily as needed. Apply patch to area most significant pain once per day.  Remove and discard patch within 12 hours of application. 01/09/22   Petrucelli, Samantha R, PA-C  methocarbamol (ROBAXIN) 500 MG tablet Take 1 tablet (500 mg total) by mouth every 8 (eight) hours as needed for muscle spasms. 01/09/22   Petrucelli, Aldona Bar R, PA-C  predniSONE (DELTASONE) 20 MG tablet 3  tabs po day one, then 2 tabs daily x 4 days 01/03/22   Domenic Moras, PA-C  VENTOLIN HFA 108 (90 Base) MCG/ACT inhaler INHALE 2 PUFFS INTO THE LUNGS EVERY 6 HOURS AS NEEDED FOR WHEEZING OR SHORTNESS OF BREATH 10/16/21   Bo Merino I, NP      Allergies    Nsaids    Review of Systems   Review of Systems  Physical Exam Updated Vital Signs BP (!) 139/91   Pulse 76   Temp 98.6 F (37 C) (Oral)   Resp 16   Ht '5\' 4"'$  (1.626 m)   SpO2 96%   BMI 34.84 kg/m  Physical Exam Vitals and nursing note reviewed. Exam conducted with a chaperone present.  Constitutional:      General: She is not in acute distress.    Appearance: Normal appearance.  HENT:     Head: Normocephalic and atraumatic.      Comments: Uvula is midline, normal phonation.  Tenderness and swelling right lower jaw, does not cross midline. No submandibular swelling.  Eyes:     General: No scleral icterus.    Extraocular Movements: Extraocular movements intact.     Pupils: Pupils are equal, round, and reactive to light.  Skin:    Coloration: Skin is not jaundiced.  Neurological:     Mental Status: She is alert. Mental status is at baseline.     Coordination: Coordination normal.  ED Results / Procedures / Treatments   Labs (all labs ordered are listed, but only abnormal results are displayed) Labs Reviewed  BASIC METABOLIC PANEL - Abnormal; Notable for the following components:      Result Value   Sodium 133 (*)    CO2 21 (*)    Creatinine, Ser 1.07 (*)    All other components within normal limits  CBC WITH DIFFERENTIAL/PLATELET - Abnormal; Notable for the following components:   Hemoglobin 10.9 (*)    HCT 33.0 (*)    MCV 78.2 (*)    MCH 25.8 (*)    RDW 15.8 (*)    All other components within normal limits  I-STAT BETA HCG BLOOD, ED ([facility], [facility], [facility] ONLY)    EKG None  Radiology CT Maxillofacial W Contrast  Result Date: 09/12/2022 CLINICAL DATA:  Mastication paralysis.  Bottom right dental pain. EXAM:  CT MAXILLOFACIAL WITH CONTRAST TECHNIQUE: Multidetector CT imaging of the maxillofacial structures was performed with intravenous contrast. Multiplanar CT image reconstructions were also generated. RADIATION DOSE REDUCTION: This exam was performed according to the departmental dose-optimization program which includes automated exposure control, adjustment of the mA and/or kV according to patient size and/or use of iterative reconstruction technique. CONTRAST:  60m OMNIPAQUE IOHEXOL 350 MG/ML SOLN COMPARISON:  None Available. FINDINGS: Osseous: There are multiple large dental caries the bilateral mandibular arch dentition and the right maxillary arch dentition. There are large periapical lucencies along the expected location of the right maxillary molars and right maxillary canines lateral incisors (series 10, image 39, 34). Periapical lucencies are also present along the right mandibular first molar (series 10, image 33) and left maxillary molar (series 10, image 60). Orbits: Negative. No traumatic or inflammatory finding. Sinuses: There is pansinus mucosal thickening with near-complete opacification of the right sphenoid, bilateral ethmoid, bilateral maxillary sinuses. Near osseous findings suggestive chronic maxillary and right sphenoid sinusitis. Soft tissues: There is soft tissue stranding in the subcutaneous soft tissues around the right perimandibular region. Soft tissue stranding is also seen in the right submandibular region (series 7, image 43). There is no evidence of a discrete drainable fluid collection, there appear to be focal lesions of hyperdensity within this area of soft tissue stranding (series 10, image 31) There is hypertrophy of the bilateral palatine and lingual tonsils, which is nonspecific and may be reactive. No evidence of a tonsillar or peritonsillar abscess. Limited intracranial: No significant or unexpected finding. IMPRESSION: 1. Soft tissue stranding in the subcutaneous soft tissues  around the right perimandibular region/chin, as well as mild soft tissue stranding in the right submandibular region, consistent with cellulitis. There are areas of low density within this region of soft tissue stranding, which could represent phlegmonous change. No discrete drainable fluid colleciton. 2. Severe odontogenic disease, as above. 3. Pansinus mucosal thickening with near-complete opacification of the right sphenoid, bilateral ethmoid, bilateral maxillary sinuses. Near osseous findings suggestive of chronic maxillary and right sphenoid sinusitis. 4. Hypertrophy of the bilateral palatine and lingual tonsils, which is nonspecific and may be reactive. No evidence of a tonsillar or peritonsillar abscess. Electronically Signed   By: HMarin RobertsM.D.   On: 09/12/2022 15:20    Procedures Procedures    Medications Ordered in ED Medications  iohexol (OMNIPAQUE) 350 MG/ML injection 75 mL (75 mLs Intravenous Contrast Given 09/12/22 1452)  clindamycin (CLEOCIN) capsule 300 mg (300 mg Oral Given 09/12/22 1616)    ED Course/ Medical Decision Making/ A&P  Medical Decision Making Amount and/or Complexity of Data Reviewed Labs: ordered. Radiology: ordered.  Risk Prescription drug management.   This is a 42 year old female presenting to the emergency department due to right lower jaw pain/swelling.  Functional includes abscess, osteomyelitis, cellulitis.  Patient is having angina but it is not cross the midline, there is no submandibular tenderness or swelling and I think that is less likely.  I will proceed with a CT to evaluate for developing abscess versus osteomyelitis, will also check laboratory workup and then reevaluate.  Laboratory workup is unremarkable, no leukocytosis.  Patient does not meet any septic criteria.    CT is notable for cellulitis but no appreciable phlegmon or collection, not amenable to I&D.  I considered admission for IV antibiotics the  patient is nonseptic, is able to take antibiotics by mouth and does not have any trismus.  Will discharge home with clindamycin and dental follow-up.  Return precautions were discussed with the patient who verbalized understanding and agreement.  Discussed HPI, physical exam and plan of care for this patient with attending Lajean Saver. The attending physician evaluated this patient as part of a shared visit and agrees with plan of care.         Final Clinical Impression(s) / ED Diagnoses Final diagnoses:  Periorbital cellulitis of right eye    Rx / DC Orders ED Discharge Orders          Ordered    clindamycin (CLEOCIN) 300 MG capsule  3 times daily        09/12/22 1606              Sherrill Raring, PA-C 09/12/22 2037    Lajean Saver, MD 09/13/22 1049

## 2022-09-12 NOTE — Discharge Instructions (Addendum)
You are seen today in the emergency for facial swelling, you have a cellulitis to the right jaw.  Take the antibiotic 3 times daily for the next 7 days.  Call and schedule an appointment for follow-up with a dentist information above. Return for worsening swelling, fevers, crossing to the other side of your face, etc  Narrative  CLINICAL DATA:  Mastication paralysis.  Bottom right dental pain.    EXAM:  CT MAXILLOFACIAL WITH CONTRAST    TECHNIQUE:  Multidetector CT imaging of the maxillofacial structures was  performed with intravenous contrast. Multiplanar CT image  reconstructions were also generated.    RADIATION DOSE REDUCTION: This exam was performed according to the  departmental dose-optimization program which includes automated  exposure control, adjustment of the mA and/or kV according to  patient size and/or use of iterative reconstruction technique.    CONTRAST:  23m OMNIPAQUE IOHEXOL 350 MG/ML SOLN    COMPARISON:  None Available.    FINDINGS:  Osseous: There are multiple large dental caries the bilateral  mandibular arch dentition and the right maxillary arch dentition.  There are large periapical lucencies along the expected location of  the right maxillary molars and right maxillary canines lateral  incisors (series 10, image 39, 34). Periapical lucencies are also  present along the right mandibular first molar (series 10, image 33)  and left maxillary molar (series 10, image 60).    Orbits: Negative. No traumatic or inflammatory finding.    Sinuses: There is pansinus mucosal thickening with near-complete  opacification of the right sphenoid, bilateral ethmoid, bilateral  maxillary sinuses. Near osseous findings suggestive chronic  maxillary and right sphenoid sinusitis.    Soft tissues: There is soft tissue stranding in the subcutaneous  soft tissues around the right perimandibular region. Soft tissue  stranding is also seen in the right submandibular region  (series 7,  image 43). There is no evidence of a discrete drainable fluid  collection, there appear to be focal lesions of hyperdensity within  this area of soft tissue stranding (series 10, image 31)    There is hypertrophy of the bilateral palatine and lingual tonsils,  which is nonspecific and may be reactive. No evidence of a tonsillar  or peritonsillar abscess.    Limited intracranial: No significant or unexpected finding.    IMPRESSION:  1. Soft tissue stranding in the subcutaneous soft tissues around the  right perimandibular region/chin, as well as mild soft tissue  stranding in the right submandibular region, consistent with  cellulitis. There are areas of low density within this region of  soft tissue stranding, which could represent phlegmonous change. No  discrete drainable fluid colleciton.  2. Severe odontogenic disease, as above.  3. Pansinus mucosal thickening with near-complete opacification of  the right sphenoid, bilateral ethmoid, bilateral maxillary sinuses.  Near osseous findings suggestive of chronic maxillary and right  sphenoid sinusitis.  4. Hypertrophy of the bilateral palatine and lingual tonsils, which  is nonspecific and may be reactive. No evidence of a tonsillar or  peritonsillar abscess.      Electronically Signed    By: HMarin RobertsM.D.    On: 09/12/2022 15:20

## 2022-09-12 NOTE — ED Notes (Signed)
Pt has 3+ swelling of right side of face. Pt is eupneic. Pt able to maintain secretions.

## 2022-09-13 ENCOUNTER — Telehealth: Payer: Self-pay | Admitting: Nurse Practitioner

## 2022-09-13 NOTE — Telephone Encounter (Signed)
Please resend medication for gout to [facility] [address]   She said they moved locations

## 2022-09-16 ENCOUNTER — Telehealth: Payer: Self-pay | Admitting: Nurse Practitioner

## 2022-09-16 ENCOUNTER — Other Ambulatory Visit: Payer: Self-pay

## 2022-09-16 ENCOUNTER — Telehealth: Payer: Self-pay

## 2022-09-16 DIAGNOSIS — M1A0790 Idiopathic chronic gout, unspecified ankle and foot, without tophus (tophi): Secondary | ICD-10-CM

## 2022-09-16 NOTE — Transitions of Care (Post Inpatient/ED Visit) (Cosign Needed)
   09/16/2022  Name: Suzanne Graham MRN: [id number] DOB: 10-29-1980  Today's TOC FU Call Status: Today's TOC FU Call Status:: Successful TOC FU Call Competed TOC FU Call Complete Date: 09/16/22  Transition Care Management Follow-up Telephone Call Date of Discharge: 09/12/22 Discharge Facility: [facility]) Type of Discharge: Emergency Department Reason for ED Visit: Other: How have you been since you were released from the hospital?: Better Any questions or concerns?: No  Items Reviewed: Did you receive and understand the discharge instructions provided?: Yes Medications obtained and verified?: Yes (Medications Reviewed) Any new allergies since your discharge?: No Dietary orders reviewed?: NA Do you have support at home?: Yes People in Home: other relative(s)  Home Care and Equipment/Supplies: [place] Ordered?: NA Any new equipment or medical supplies ordered?: NA  Functional Questionnaire: Do you need assistance with bathing/showering or dressing?: No Do you need assistance with meal preparation?: No Do you need assistance with eating?: No Do you have difficulty maintaining continence: No Do you need assistance with getting out of bed/getting out of a chair/moving?: No Do you have difficulty managing or taking your medications?: No  Folllow up appointments reviewed: PCP Follow-up appointment confirmed?: Yes Follow-up Provider: [facility] Follow-up appointment confirmed?: Yes Date of Specialist follow-up appointment?: 09/19/22 (pt will be contacted) Do you need transportation to your follow-up appointment?: No Do you understand care options if your condition(s) worsen?: Yes-patient verbalized understanding    [place] RMA

## 2022-09-16 NOTE — Telephone Encounter (Signed)
Caller & Relationship to patient:  MRN #  [id number]   Call Back Number:   Date of Last Office Visit: 09/16/2022     Date of Next Office Visit: 09/18/2022    Medication(s) to be Refilled: colchicine  Preferred Pharmacy: [email or web address]   3rd request    ** Please notify patient to allow 48-72 hours to process** **Let patient know to contact pharmacy at the end of the day to make sure medication is ready. ** **If patient has not been seen in a year or longer, book an appointment **Advise to use MyChart for refill requests OR to contact their pharmacy

## 2022-09-16 NOTE — Telephone Encounter (Signed)
Please send meds to [facility] on [address] and [address]

## 2022-09-16 NOTE — Telephone Encounter (Signed)
Caller & Relationship to patient:  MRN #  [id number]   Call Back Number:   Date of Last Office Visit: 09/16/2022     Date of Next Office Visit: 09/18/2022    Medication(s) to be Refilled: Gaut med to be refilled  Preferred Pharmacy:   ** Please notify patient to allow 48-72 hours to process** **Let patient know to contact pharmacy at the end of the day to make sure medication is ready. ** **If patient has not been seen in a year or longer, book an appointment **Advise to use MyChart for refill requests OR to contact their pharmacy

## 2022-09-18 ENCOUNTER — Ambulatory Visit: Payer: Self-pay | Admitting: Nurse Practitioner

## 2022-09-19 ENCOUNTER — Encounter: Payer: Self-pay | Admitting: Nurse Practitioner

## 2022-09-19 ENCOUNTER — Ambulatory Visit: Payer: Medicaid Other | Admitting: Nurse Practitioner

## 2022-09-19 VITALS — BP 122/73 | HR 75 | Temp 97.8°F | Ht 64.0 in | Wt 174.4 lb

## 2022-09-19 DIAGNOSIS — M1A0790 Idiopathic chronic gout, unspecified ankle and foot, without tophus (tophi): Secondary | ICD-10-CM | POA: Diagnosis not present

## 2022-09-19 DIAGNOSIS — K047 Periapical abscess without sinus: Secondary | ICD-10-CM | POA: Insufficient documentation

## 2022-09-19 MED ORDER — COLCHICINE 0.6 MG PO TABS: 0.6000 mg | ORAL_TABLET | Freq: Two times a day (BID) | ORAL | 2 refills | Status: DC | PRN

## 2022-09-19 NOTE — Patient Instructions (Addendum)
1. Dental abscess  Complete clindamycin  Follow up with dentist  2. Chronic gout of foot, unspecified cause, unspecified laterality  - colchicine 0.6 MG tablet; Take 1 tablet (0.6 mg total) by mouth 2 (two) times daily as needed.  Dispense: 60 tablet; Refill: 2  Follow up:  Follow up in 3 months

## 2022-09-19 NOTE — Assessment & Plan Note (Signed)
Complete clindamycin  Follow up with dentist  2. Chronic gout of foot, unspecified cause, unspecified laterality  - colchicine 0.6 MG tablet; Take 1 tablet (0.6 mg total) by mouth 2 (two) times daily as needed.  Dispense: 60 tablet; Refill: 2  Follow up:  Follow up in 3 months

## 2022-09-19 NOTE — Progress Notes (Signed)
$'@Patient'E$  ID: Suzanne Graham, female    DOB: 1980-12-27, 42 y.o.   MRN: [id number]  Chief Complaint  Patient presents with   Hospitalization Follow-up    Tooth ache    Referring provider: Fenton Foy, NP   HPI  Suzanne Graham 42 y.o. female  has a past medical history of Acid reflux (08/2019), Asthma, Depression, Gout, Hypertension, and Stroke ([place]). To the [facility] for reevaluation of gout pain and hypertension.    Patient presents today for a follow-up from a recent ED visit.  Patient was seen for dental abscess.  She was prescribed clindamycin and continues on antibiotic.  She was advised to follow-up with a dentist but has not made an appointment with dentist yet. Will call to make appointment  - does have office number. Denies f/c/s, n/v/d, hemoptysis, PND, leg swelling Denies chest pain or edema     Allergies  Allergen Reactions   Nsaids Other (See Comments)    Likely causing AKI     There is no immunization history on file for this patient.  Past Medical History:  Diagnosis Date   Acid reflux 08/2019   Asthma    Depression    Gout    Hypertension    Stroke ([place])     Tobacco History: Social History   Tobacco Use  Smoking Status Every Day   Packs/day: 0.25   Years: 7.00   Additional pack years: 0.00   Total pack years: 1.75   Types: Cigarettes  Smokeless Tobacco Never   Ready to quit: Not Answered Counseling given: Not Answered   Outpatient Encounter Medications as of 09/19/2022  Medication Sig   acetaminophen (TYLENOL) 500 MG tablet Take 500 mg by mouth every 6 (six) hours as needed for moderate pain or headache.   allopurinol (ZYLOPRIM) 300 MG tablet Take 1 tablet (300 mg total) by mouth daily.   amLODipine (NORVASC) 10 MG tablet TAKE 1 TABLET BY MOUTH DAILY   clindamycin (CLEOCIN) 300 MG capsule Take 1 capsule (300 mg total) by mouth 3 (three) times daily.   lidocaine (LIDODERM) 5 % Place 1 patch onto the skin daily as needed. Apply patch to area  most significant pain once per day.  Remove and discard patch within 12 hours of application.   [DISCONTINUED] colchicine 0.6 MG tablet Take 1 tablet (0.6 mg total) by mouth 2 (two) times daily as needed.   colchicine 0.6 MG tablet Take 1 tablet (0.6 mg total) by mouth 2 (two) times daily as needed.   methocarbamol (ROBAXIN) 500 MG tablet Take 1 tablet (500 mg total) by mouth every 8 (eight) hours as needed for muscle spasms. (Patient not taking: Reported on 09/16/2022)   predniSONE (DELTASONE) 20 MG tablet 3 tabs po day one, then 2 tabs daily x 4 days (Patient not taking: Reported on 09/16/2022)   VENTOLIN HFA 108 (90 Base) MCG/ACT inhaler INHALE 2 PUFFS INTO THE LUNGS EVERY 6 HOURS AS NEEDED FOR WHEEZING OR SHORTNESS OF BREATH (Patient not taking: Reported on 09/19/2022)   No facility-administered encounter medications on file as of 09/19/2022.     Review of Systems  Review of Systems  Constitutional: Negative.   HENT: Negative.    Cardiovascular: Negative.   Gastrointestinal: Negative.   Allergic/Immunologic: Negative.   Neurological: Negative.   Psychiatric/Behavioral: Negative.         Physical Exam  BP 122/73   Pulse 75   Temp 97.8 F (36.6 C)   Ht '5\' 4"'$  (1.626 m)  Wt 174 lb 6.4 oz (79.1 kg)   LMP 08/08/2022   SpO2 100%   BMI 29.94 kg/m   Wt Readings from Last 5 Encounters:  09/19/22 174 lb 6.4 oz (79.1 kg)  01/03/22 203 lb (92.1 kg)  12/12/21 179 lb 3.2 oz (81.3 kg)  10/23/21 177 lb (80.3 kg)  07/25/21 197 lb (89.4 kg)     Physical Exam   Lab Results:  CBC    Component Value Date/Time   WBC 6.9 09/12/2022 1320   RBC 4.22 09/12/2022 1320   HGB 10.9 (L) 09/12/2022 1320   HGB 11.3 12/12/2021 1231   HCT 33.0 (L) 09/12/2022 1320   HCT 35.3 12/12/2021 1231   PLT 218 09/12/2022 1320   PLT 346 12/12/2021 1231   MCV 78.2 (L) 09/12/2022 1320   MCV 78 (L) 12/12/2021 1231   MCH 25.8 (L) 09/12/2022 1320   MCHC 33.0 09/12/2022 1320   RDW 15.8 (H) 09/12/2022  1320   RDW 14.7 12/12/2021 1231   LYMPHSABS 0.9 09/12/2022 1320   LYMPHSABS 1.4 12/12/2021 1231   MONOABS 0.5 09/12/2022 1320   EOSABS 0.1 09/12/2022 1320   EOSABS 0.3 12/12/2021 1231   BASOSABS 0.0 09/12/2022 1320   BASOSABS 0.0 12/12/2021 1231    BMET    Component Value Date/Time   NA 133 (L) 09/12/2022 1320   NA 137 12/12/2021 1231   K 4.7 09/12/2022 1320   CL 102 09/12/2022 1320   CO2 21 (L) 09/12/2022 1320   GLUCOSE 86 09/12/2022 1320   BUN 16 09/12/2022 1320   BUN 15 12/12/2021 1231   CREATININE 1.07 (H) 09/12/2022 1320   CALCIUM 9.0 09/12/2022 1320   GFRNONAA >60 09/12/2022 1320   GFRAA 72 11/15/2019 1139    BNP No results found for: "BNP"  ProBNP No results found for: "PROBNP"  Imaging: CT Maxillofacial W Contrast  Result Date: 09/12/2022 CLINICAL DATA:  Mastication paralysis.  Bottom right dental pain. EXAM: CT MAXILLOFACIAL WITH CONTRAST TECHNIQUE: Multidetector CT imaging of the maxillofacial structures was performed with intravenous contrast. Multiplanar CT image reconstructions were also generated. RADIATION DOSE REDUCTION: This exam was performed according to the departmental dose-optimization program which includes automated exposure control, adjustment of the mA and/or kV according to patient size and/or use of iterative reconstruction technique. CONTRAST:  37m OMNIPAQUE IOHEXOL 350 MG/ML SOLN COMPARISON:  None Available. FINDINGS: Osseous: There are multiple large dental caries the bilateral mandibular arch dentition and the right maxillary arch dentition. There are large periapical lucencies along the expected location of the right maxillary molars and right maxillary canines lateral incisors (series 10, image 39, 34). Periapical lucencies are also present along the right mandibular first molar (series 10, image 33) and left maxillary molar (series 10, image 60). Orbits: Negative. No traumatic or inflammatory finding. Sinuses: There is pansinus mucosal  thickening with near-complete opacification of the right sphenoid, bilateral ethmoid, bilateral maxillary sinuses. Near osseous findings suggestive chronic maxillary and right sphenoid sinusitis. Soft tissues: There is soft tissue stranding in the subcutaneous soft tissues around the right perimandibular region. Soft tissue stranding is also seen in the right submandibular region (series 7, image 43). There is no evidence of a discrete drainable fluid collection, there appear to be focal lesions of hyperdensity within this area of soft tissue stranding (series 10, image 31) There is hypertrophy of the bilateral palatine and lingual tonsils, which is nonspecific and may be reactive. No evidence of a tonsillar or peritonsillar abscess. Limited intracranial: No significant or unexpected  finding. IMPRESSION: 1. Soft tissue stranding in the subcutaneous soft tissues around the right perimandibular region/chin, as well as mild soft tissue stranding in the right submandibular region, consistent with cellulitis. There are areas of low density within this region of soft tissue stranding, which could represent phlegmonous change. No discrete drainable fluid colleciton. 2. Severe odontogenic disease, as above. 3. Pansinus mucosal thickening with near-complete opacification of the right sphenoid, bilateral ethmoid, bilateral maxillary sinuses. Near osseous findings suggestive of chronic maxillary and right sphenoid sinusitis. 4. Hypertrophy of the bilateral palatine and lingual tonsils, which is nonspecific and may be reactive. No evidence of a tonsillar or peritonsillar abscess. Electronically Signed   By: Marin Roberts M.D.   On: 09/12/2022 15:20     Assessment & Plan:   Dental abscess Complete clindamycin  Follow up with dentist  2. Chronic gout of foot, unspecified cause, unspecified laterality  - colchicine 0.6 MG tablet; Take 1 tablet (0.6 mg total) by mouth 2 (two) times daily as needed.  Dispense: 60 tablet;  Refill: 2  Follow up:  Follow up in 3 months     Fenton Foy, NP 09/19/2022

## 2022-10-10 ENCOUNTER — Other Ambulatory Visit: Payer: Self-pay

## 2022-10-10 DIAGNOSIS — I10 Essential (primary) hypertension: Secondary | ICD-10-CM

## 2022-10-10 MED ORDER — AMLODIPINE BESYLATE 10 MG PO TABS: ORAL_TABLET | Freq: Every day | ORAL | 1 refills | Status: DC

## 2022-10-11 ENCOUNTER — Telehealth: Payer: Self-pay | Admitting: Nurse Practitioner

## 2022-10-11 NOTE — Telephone Encounter (Signed)
B/P med was sent please advise on the inhaler [facility]

## 2022-10-11 NOTE — Telephone Encounter (Signed)
Please send blood pressure and inhaler medication to [facility] on [address]

## 2022-10-14 ENCOUNTER — Other Ambulatory Visit: Payer: Self-pay | Admitting: Nurse Practitioner

## 2022-10-14 DIAGNOSIS — R0602 Shortness of breath: Secondary | ICD-10-CM

## 2022-10-14 MED ORDER — ALBUTEROL SULFATE HFA 108 (90 BASE) MCG/ACT IN AERS: 2.0000 | INHALATION_SPRAY | Freq: Four times a day (QID) | RESPIRATORY_TRACT | 2 refills | Status: DC | PRN

## 2022-11-01 ENCOUNTER — Ambulatory Visit: Payer: Medicaid Other | Admitting: Nurse Practitioner

## 2022-11-01 ENCOUNTER — Encounter: Payer: Self-pay | Admitting: Nurse Practitioner

## 2022-11-01 VITALS — BP 125/71 | HR 81 | Temp 97.3°F | Wt 175.8 lb

## 2022-11-01 DIAGNOSIS — M7052 Other bursitis of knee, left knee: Secondary | ICD-10-CM

## 2022-11-01 MED ORDER — PREDNISONE 10 MG PO TABS: ORAL_TABLET | ORAL | 0 refills | Status: DC

## 2022-11-01 NOTE — Progress Notes (Signed)
@Patient  ID: Suzanne Graham, female    DOB: 11/16/1980, 42 y.o.   MRN: [id number]  Chief Complaint  Patient presents with   Knee Pain    Left no injury started two weeks ago    Referring provider: Ivonne Andrew, NP   HPI  Patient presents today for knee pain.  She states that this has been ongoing for the past 2 weeks.  She states that the pain is in her left knee.  She denies any recent injury.  Patient does have a history of gout but states that she has not been having redness or warmth to the touch around the knee joint.  She does have full range of motion.  We will trial prednisone.  Most likely bursitis versus gout.  We did discuss elevating her knee over the weekend and using ice packs.  We also discussed getting a knee sleeve.  We also discussed the importance of supportive shoes at work. Denies f/c/s, n/v/d, hemoptysis, PND, leg swelling Denies chest pain or edema      Allergies  Allergen Reactions   Nsaids Other (See Comments)    Likely causing AKI     There is no immunization history on file for this patient.  Past Medical History:  Diagnosis Date   Acid reflux 08/2019   Asthma    Depression    Gout    Hypertension    Stroke ([facility])     Tobacco History: Social History   Tobacco Use  Smoking Status Every Day   Packs/day: 0.25   Years: 7.00   Additional pack years: 0.00   Total pack years: 1.75   Types: Cigarettes  Smokeless Tobacco Never   Ready to quit: Not Answered Counseling given: Not Answered   Outpatient Encounter Medications as of 11/01/2022  Medication Sig   acetaminophen (TYLENOL) 500 MG tablet Take 500 mg by mouth every 6 (six) hours as needed for moderate pain or headache.   albuterol (VENTOLIN HFA) 108 (90 Base) MCG/ACT inhaler Inhale 2 puffs into the lungs every 6 (six) hours as needed for wheezing or shortness of breath.   allopurinol (ZYLOPRIM) 300 MG tablet Take 1 tablet (300 mg total) by mouth daily.   amLODipine (NORVASC) 10 MG  tablet TAKE 1 TABLET BY MOUTH DAILY   colchicine 0.6 MG tablet Take 1 tablet (0.6 mg total) by mouth 2 (two) times daily as needed.   predniSONE (DELTASONE) 10 MG tablet Take 4 tabs for 2 days, then 3 tabs for 2 days, then 2 tabs for 2 days, then 1 tab for 2 days, then stop   clindamycin (CLEOCIN) 300 MG capsule Take 1 capsule (300 mg total) by mouth 3 (three) times daily. (Patient not taking: Reported on 11/01/2022)   lidocaine (LIDODERM) 5 % Place 1 patch onto the skin daily as needed. Apply patch to area most significant pain once per day.  Remove and discard patch within 12 hours of application. (Patient not taking: Reported on 11/01/2022)   methocarbamol (ROBAXIN) 500 MG tablet Take 1 tablet (500 mg total) by mouth every 8 (eight) hours as needed for muscle spasms. (Patient not taking: Reported on 09/16/2022)   predniSONE (DELTASONE) 20 MG tablet 3 tabs po day one, then 2 tabs daily x 4 days (Patient not taking: Reported on 09/16/2022)   No facility-administered encounter medications on file as of 11/01/2022.     Review of Systems  Review of Systems  Constitutional: Negative.   HENT: Negative.    Cardiovascular: Negative.  Gastrointestinal: Negative.   Musculoskeletal:        Left knee pain  Allergic/Immunologic: Negative.   Neurological: Negative.   Psychiatric/Behavioral: Negative.         Physical Exam  BP 125/71   Pulse 81   Temp (!) 97.3 F (36.3 C)   Wt 175 lb 12.8 oz (79.7 kg)   LMP 10/24/2022   SpO2 100%   BMI 30.18 kg/m   Wt Readings from Last 5 Encounters:  11/01/22 175 lb 12.8 oz (79.7 kg)  09/19/22 174 lb 6.4 oz (79.1 kg)  01/03/22 203 lb (92.1 kg)  12/12/21 179 lb 3.2 oz (81.3 kg)  10/23/21 177 lb (80.3 kg)     Physical Exam Vitals and nursing note reviewed.  Constitutional:      General: She is not in acute distress.    Appearance: She is well-developed.  Cardiovascular:     Rate and Rhythm: Normal rate and regular rhythm.  Pulmonary:      Effort: Pulmonary effort is normal.     Breath sounds: Normal breath sounds.  Neurological:     Mental Status: She is alert and oriented to person, place, and time.      Lab Results:  CBC    Component Value Date/Time   WBC 6.9 09/12/2022 1320   RBC 4.22 09/12/2022 1320   HGB 10.9 (L) 09/12/2022 1320   HGB 11.3 12/12/2021 1231   HCT 33.0 (L) 09/12/2022 1320   HCT 35.3 12/12/2021 1231   PLT 218 09/12/2022 1320   PLT 346 12/12/2021 1231   MCV 78.2 (L) 09/12/2022 1320   MCV 78 (L) 12/12/2021 1231   MCH 25.8 (L) 09/12/2022 1320   MCHC 33.0 09/12/2022 1320   RDW 15.8 (H) 09/12/2022 1320   RDW 14.7 12/12/2021 1231   LYMPHSABS 0.9 09/12/2022 1320   LYMPHSABS 1.4 12/12/2021 1231   MONOABS 0.5 09/12/2022 1320   EOSABS 0.1 09/12/2022 1320   EOSABS 0.3 12/12/2021 1231   BASOSABS 0.0 09/12/2022 1320   BASOSABS 0.0 12/12/2021 1231    BMET    Component Value Date/Time   NA 133 (L) 09/12/2022 1320   NA 137 12/12/2021 1231   K 4.7 09/12/2022 1320   CL 102 09/12/2022 1320   CO2 21 (L) 09/12/2022 1320   GLUCOSE 86 09/12/2022 1320   BUN 16 09/12/2022 1320   BUN 15 12/12/2021 1231   CREATININE 1.07 (H) 09/12/2022 1320   CALCIUM 9.0 09/12/2022 1320   GFRNONAA >60 09/12/2022 1320   GFRAA 72 11/15/2019 1139      Assessment & Plan:   Bursitis of left knee - predniSONE (DELTASONE) 10 MG tablet; Take 4 tabs for 2 days, then 3 tabs for 2 days, then 2 tabs for 2 days, then 1 tab for 2 days, then stop  Dispense: 20 tablet; Refill: 0  Follow up:  Follow up as scheduled     Ivonne Andrew, NP 11/01/2022

## 2022-11-01 NOTE — Patient Instructions (Addendum)
1. Bursitis of left knee, unspecified bursa  - predniSONE (DELTASONE) 10 MG tablet; Take 4 tabs for 2 days, then 3 tabs for 2 days, then 2 tabs for 2 days, then 1 tab for 2 days, then stop  Dispense: 20 tablet; Refill: 0  Follow up:  Follow up as scheduled      Bursitis  Bursitis is when the fluid-filled sac (bursa) that covers and protects a joint is swollen (inflamed). Bursitis is most common near joints such as the knees, elbows, hips, and shoulders. It can cause pain and stiffness. What are the causes? An injury to a joint area. Repeated use of a joint. Infection. Certain conditions that cause swelling. What increases the risk? Putting stress on a joint over and over again. Having a condition that weakens your body's defense system (immune system). Doing any of these often: Lifting and reaching overhead. Kneeling or leaning on hard surfaces. Doing activities that have a motion that you do over and over again. This includes running and walking. What are the signs or symptoms? Common symptoms of this condition include: Pain that gets worse when you move the affected body part or use it to support your body weight. Irritation and swelling (inflammation). Stiffness. Other symptoms include: Redness. Swelling. Tenderness. Warmth. Pain that stays after rest. Fever or chills if there is an infection. How is this treated? This condition can often be treated at home with: Rest. Ice. Wrapping the area with an elastic bandage (compression). Keeping the affected area raised (elevation). Other treatments may include: Medicine for pain and swelling. Shots of medicine to the area to lessen swelling. Draining fluid out of the bursa. Antibiotic medicine for infection. Using a splint, brace, wrap, pads, or walking aid. Therapy if pain continues or you have limited movement. Surgery. Follow these instructions at home: Medicines Take over-the-counter and prescription medicines  only as told by your doctor. If you were prescribed an antibiotic medicine, take it as told by your doctor. Do not stop taking it even if you start to feel better. Managing pain, stiffness, and swelling     Raise the injured area above the level of your heart while you are sitting or lying down. If told, put ice on the affected area. To do this: Put ice in a plastic bag. Place a towel between your skin and the bag, or between your splint or brace and the bag. Leave the ice on for 20 minutes, 2-3 times a day. Take off the ice if your skin turns bright red. This is very important. If you cannot feel pain, heat, or cold, you have a greater risk of damage to the area. If told, put heat on the affected area. Do this as often as told by your doctor. Use the heat source that your doctor recommends, such as a moist heat pack or a heating pad. Place a towel between your skin and the heat source. Leave the heat on for 20-30 minutes. Take off the heat if your skin turns bright red. This is very important. If you cannot feel pain, heat, or cold, you have a greater risk of getting burned. General instructions Rest the affected area as told by your doctor. Avoid doing things that make the pain worse. Use a splint, brace, pad, wrap, or walking aid as told by your doctor. Keep all follow-up visits. Preventing symptoms Wear knee pads if you kneel often. Wear running or walking shoes that fit you well. Take a lot of breaks during activities that  involve doing the same movements again and again. Before you do any activity that takes a lot of effort, get your body ready by stretching. Stay at a healthy weight or lose weight if your doctor says you should. If you need help doing this, ask your doctor. Exercise often. If you start any new physical activity, do it slowly. Work with your physical or occupational therapist and doctor to find what caused the bursitis. Contact a doctor if: You have a fever or  chills. You have symptoms that do not get better with treatment. You have pain or swelling that: Gets worse. Goes away and then comes back. You have pus coming from the affected area. You have redness around the affected area. The affected area is warm to the touch. Summary Bursitis is when the fluid-filled sac (bursa) that covers and protects a joint is swollen. Rest the affected area as told by your doctor. Avoid doing things that make the pain worse. Put ice on the affected area as told by your doctor. This information is not intended to replace advice given to you by your health care provider. Make sure you discuss any questions you have with your health care provider. Document Revised: 06/19/2021 Document Reviewed: 06/19/2021 Elsevier Patient Education  2023 [employer].

## 2022-11-01 NOTE — Assessment & Plan Note (Signed)
-   predniSONE (DELTASONE) 10 MG tablet; Take 4 tabs for 2 days, then 3 tabs for 2 days, then 2 tabs for 2 days, then 1 tab for 2 days, then stop  Dispense: 20 tablet; Refill: 0  Follow up:  Follow up as scheduled

## 2022-12-06 ENCOUNTER — Ambulatory Visit: Payer: Self-pay | Admitting: Nurse Practitioner

## 2022-12-20 ENCOUNTER — Ambulatory Visit: Payer: Medicaid Other | Admitting: Nurse Practitioner

## 2022-12-20 ENCOUNTER — Encounter: Payer: Self-pay | Admitting: Nurse Practitioner

## 2022-12-20 ENCOUNTER — Ambulatory Visit (HOSPITAL_COMMUNITY)
Admission: RE | Admit: 2022-12-20 | Discharge: 2022-12-20 | Disposition: A | Discharge status: Home / Self Care | Payer: Medicaid Other | Source: Ambulatory Visit | Attending: Nurse Practitioner | Admitting: Nurse Practitioner

## 2022-12-20 VITALS — BP 122/81 | HR 70 | Temp 97.2°F | Wt 171.8 lb

## 2022-12-20 DIAGNOSIS — Z13220 Encounter for screening for lipoid disorders: Secondary | ICD-10-CM | POA: Diagnosis not present

## 2022-12-20 DIAGNOSIS — M7052 Other bursitis of knee, left knee: Secondary | ICD-10-CM | POA: Diagnosis not present

## 2022-12-20 DIAGNOSIS — I10 Essential (primary) hypertension: Secondary | ICD-10-CM | POA: Diagnosis not present

## 2022-12-20 DIAGNOSIS — M25562 Pain in left knee: Secondary | ICD-10-CM | POA: Diagnosis not present

## 2022-12-20 NOTE — Progress Notes (Signed)
@Patient  ID: Suzanne Graham, female    DOB: Jun 03, 1981, 42 y.o.   MRN: [id number]  Chief Complaint  Patient presents with   Hypertension    Follow up    Referring provider: Ivonne Andrew, NP   HPI  Suzanne Graham 42 y.o. female  has a past medical history of Acid reflux (08/2019), Asthma, Depression, Gout, Hypertension, and Stroke (HCC). To the [facility] for reevaluation of gout pain and hypertension.   Hypertension: Patient here for follow-up of elevated blood pressure. She is not exercising and is adherent to low salt diet.  Does not check B/P regularly at home. Cardiac symptoms none. Patient denies chest pain, claudication, dyspnea, and fatigue.  Cardiovascular risk factors: obesity (BMI >= 30 kg/m2) and sedentary lifestyle. Use of agents associated with hypertension: none. History of target organ damage: none. When question about her B/P today, states that she has not taken her medications.  Patient was here a few weeks ago and was seen for left knee pain.  She was diagnosed with bursitis and given prednisone taper.  She states that this did make the pain go away but it came back after finishing the prednisone.  We will send her for x-ray today.  We will place referral for Ortho.  Denies f/c/s, n/v/d, hemoptysis, PND, leg swelling Denies chest pain or edema       Allergies  Allergen Reactions   Nsaids Other (See Comments)    Likely causing AKI     There is no immunization history on file for this patient.  Past Medical History:  Diagnosis Date   Acid reflux 08/2019   Asthma    Depression    Gout    Hypertension    Stroke (HCC)     Tobacco History: Social History   Tobacco Use  Smoking Status Every Day   Packs/day: 0.25   Years: 7.00   Additional pack years: 0.00   Total pack years: 1.75   Types: Cigarettes  Smokeless Tobacco Never   Ready to quit: Not Answered Counseling given: Not Answered   Outpatient Encounter Medications as of 12/20/2022   Medication Sig   acetaminophen (TYLENOL) 500 MG tablet Take 500 mg by mouth every 6 (six) hours as needed for moderate pain or headache.   albuterol (VENTOLIN HFA) 108 (90 Base) MCG/ACT inhaler Inhale 2 puffs into the lungs every 6 (six) hours as needed for wheezing or shortness of breath.   allopurinol (ZYLOPRIM) 300 MG tablet Take 1 tablet (300 mg total) by mouth daily.   amLODipine (NORVASC) 10 MG tablet TAKE 1 TABLET BY MOUTH DAILY   colchicine 0.6 MG tablet Take 1 tablet (0.6 mg total) by mouth 2 (two) times daily as needed.   clindamycin (CLEOCIN) 300 MG capsule Take 1 capsule (300 mg total) by mouth 3 (three) times daily. (Patient not taking: Reported on 11/01/2022)   lidocaine (LIDODERM) 5 % Place 1 patch onto the skin daily as needed. Apply patch to area most significant pain once per day.  Remove and discard patch within 12 hours of application. (Patient not taking: Reported on 11/01/2022)   methocarbamol (ROBAXIN) 500 MG tablet Take 1 tablet (500 mg total) by mouth every 8 (eight) hours as needed for muscle spasms. (Patient not taking: Reported on 09/16/2022)   predniSONE (DELTASONE) 10 MG tablet Take 4 tabs for 2 days, then 3 tabs for 2 days, then 2 tabs for 2 days, then 1 tab for 2 days, then stop (Patient not taking: Reported on 12/20/2022)  predniSONE (DELTASONE) 20 MG tablet 3 tabs po day one, then 2 tabs daily x 4 days (Patient not taking: Reported on 09/16/2022)   No facility-administered encounter medications on file as of 12/20/2022.     Review of Systems  Review of Systems  Constitutional: Negative.   HENT: Negative.    Cardiovascular: Negative.   Gastrointestinal: Negative.   Musculoskeletal:        Left knee pain  Allergic/Immunologic: Negative.   Neurological: Negative.   Psychiatric/Behavioral: Negative.         Physical Exam  BP 122/81   Pulse 70   Temp (!) 97.2 F (36.2 C)   Wt 171 lb 12.8 oz (77.9 kg)   SpO2 100%   BMI 29.49 kg/m   Wt Readings from  Last 5 Encounters:  12/20/22 171 lb 12.8 oz (77.9 kg)  11/01/22 175 lb 12.8 oz (79.7 kg)  09/19/22 174 lb 6.4 oz (79.1 kg)  01/03/22 203 lb (92.1 kg)  12/12/21 179 lb 3.2 oz (81.3 kg)     Physical Exam Vitals and nursing note reviewed.  Constitutional:      General: She is not in acute distress.    Appearance: She is well-developed.  Cardiovascular:     Rate and Rhythm: Normal rate and regular rhythm.  Pulmonary:     Effort: Pulmonary effort is normal.     Breath sounds: Normal breath sounds.  Musculoskeletal:     Left knee: No swelling. Normal range of motion. Tenderness present.       Legs:  Neurological:     Mental Status: She is alert and oriented to person, place, and time.      Lab Results:  CBC    Component Value Date/Time   WBC 6.9 09/12/2022 1320   RBC 4.22 09/12/2022 1320   HGB 10.9 (L) 09/12/2022 1320   HGB 11.3 12/12/2021 1231   HCT 33.0 (L) 09/12/2022 1320   HCT 35.3 12/12/2021 1231   PLT 218 09/12/2022 1320   PLT 346 12/12/2021 1231   MCV 78.2 (L) 09/12/2022 1320   MCV 78 (L) 12/12/2021 1231   MCH 25.8 (L) 09/12/2022 1320   MCHC 33.0 09/12/2022 1320   RDW 15.8 (H) 09/12/2022 1320   RDW 14.7 12/12/2021 1231   LYMPHSABS 0.9 09/12/2022 1320   LYMPHSABS 1.4 12/12/2021 1231   MONOABS 0.5 09/12/2022 1320   EOSABS 0.1 09/12/2022 1320   EOSABS 0.3 12/12/2021 1231   BASOSABS 0.0 09/12/2022 1320   BASOSABS 0.0 12/12/2021 1231    BMET    Component Value Date/Time   NA 133 (L) 09/12/2022 1320   NA 137 12/12/2021 1231   K 4.7 09/12/2022 1320   CL 102 09/12/2022 1320   CO2 21 (L) 09/12/2022 1320   GLUCOSE 86 09/12/2022 1320   BUN 16 09/12/2022 1320   BUN 15 12/12/2021 1231   CREATININE 1.07 (H) 09/12/2022 1320   CALCIUM 9.0 09/12/2022 1320   GFRNONAA >60 09/12/2022 1320   GFRAA 72 11/15/2019 1139    BNP No results found for: "BNP"  ProBNP No results found for: "PROBNP"  Imaging: No results found.   Assessment & Plan:   Bursitis of  left knee - DG Knee Complete 4 Views Left - Ambulatory referral to Orthopedics  2. Essential hypertension  - CBC - Comprehensive metabolic panel  3. Lipid screening  - Lipid Panel  Follow up:  Follow up in 6 months     Ivonne Andrew, NP 12/20/2022

## 2022-12-20 NOTE — Assessment & Plan Note (Signed)
-   DG Knee Complete 4 Views Left - Ambulatory referral to Orthopedics  2. Essential hypertension  - CBC - Comprehensive metabolic panel  3. Lipid screening  - Lipid Panel  Follow up:  Follow up in 6 months

## 2022-12-20 NOTE — Patient Instructions (Signed)
1. Bursitis of left knee, unspecified bursa  - DG Knee Complete 4 Views Left - Ambulatory referral to Orthopedics  2. Essential hypertension  - CBC - Comprehensive metabolic panel  3. Lipid screening  - Lipid Panel  Follow up:  Follow up in 6 months

## 2022-12-21 LAB — COMPREHENSIVE METABOLIC PANEL
ALT: 20 IU/L (ref 0–32)
AST: 35 IU/L (ref 0–40)
Albumin/Globulin Ratio: 1.4
Albumin: 4.2 g/dL (ref 3.9–4.9)
Alkaline Phosphatase: 51 IU/L (ref 44–121)
BUN/Creatinine Ratio: 12 (ref 9–23)
BUN: 12 mg/dL (ref 6–24)
Bilirubin Total: 0.3 mg/dL (ref 0.0–1.2)
CO2: 19 mmol/L — ABNORMAL LOW (ref 20–29)
Calcium: 9.5 mg/dL (ref 8.7–10.2)
Chloride: 108 mmol/L — ABNORMAL HIGH (ref 96–106)
Creatinine, Ser: 1.02 mg/dL — ABNORMAL HIGH (ref 0.57–1.00)
Globulin, Total: 3 g/dL (ref 1.5–4.5)
Glucose: 83 mg/dL (ref 70–99)
Potassium: 4.4 mmol/L (ref 3.5–5.2)
Sodium: 140 mmol/L (ref 134–144)
Total Protein: 7.2 g/dL (ref 6.0–8.5)
eGFR: 70 mL/min/{1.73_m2} (ref >59)

## 2022-12-21 LAB — CBC
Hematocrit: 35 % (ref 34.0–46.6)
Hemoglobin: 11.1 g/dL (ref 11.1–15.9)
MCH: 25.8 pg — ABNORMAL LOW (ref 26.6–33.0)
MCHC: 31.7 g/dL (ref 31.5–35.7)
MCV: 81 fL (ref 79–97)
Platelets: 263 10*3/uL (ref 150–450)
RBC: 4.3 x10E6/uL (ref 3.77–5.28)
RDW: 15 % (ref 11.7–15.4)
WBC: 4.9 10*3/uL (ref 3.4–10.8)

## 2022-12-21 LAB — LIPID PANEL
Chol/HDL Ratio: 1.6 ratio (ref 0.0–4.4)
Cholesterol, Total: 233 mg/dL — ABNORMAL HIGH (ref 100–199)
HDL: 147 mg/dL (ref >39)
LDL Chol Calc (NIH): 76 mg/dL (ref 0–99)
Triglycerides: 56 mg/dL (ref 0–149)
VLDL Cholesterol Cal: 10 mg/dL (ref 5–40)

## 2022-12-31 ENCOUNTER — Other Ambulatory Visit: Payer: Self-pay

## 2022-12-31 ENCOUNTER — Encounter: Payer: Self-pay | Admitting: Sports Medicine

## 2022-12-31 ENCOUNTER — Ambulatory Visit: Payer: Medicaid Other | Admitting: Sports Medicine

## 2022-12-31 DIAGNOSIS — M1A09X0 Idiopathic chronic gout, multiple sites, without tophus (tophi): Secondary | ICD-10-CM

## 2022-12-31 DIAGNOSIS — M1712 Unilateral primary osteoarthritis, left knee: Secondary | ICD-10-CM | POA: Diagnosis not present

## 2022-12-31 DIAGNOSIS — M25461 Effusion, right knee: Secondary | ICD-10-CM | POA: Diagnosis not present

## 2022-12-31 DIAGNOSIS — M25462 Effusion, left knee: Secondary | ICD-10-CM | POA: Diagnosis not present

## 2022-12-31 DIAGNOSIS — M1711 Unilateral primary osteoarthritis, right knee: Secondary | ICD-10-CM | POA: Diagnosis not present

## 2022-12-31 MED ORDER — METHYLPREDNISOLONE ACETATE 40 MG/ML IJ SUSP
80.0000 mg | INTRAMUSCULAR | Status: AC | PRN
Start: 2022-12-31 — End: 2022-12-31
  Administered 2022-12-31: 80 mg via INTRA_ARTICULAR

## 2022-12-31 MED ORDER — BUPIVACAINE HCL 0.25 % IJ SOLN
2.0000 mL | INTRAMUSCULAR | Status: AC | PRN
Start: 2022-12-31 — End: 2022-12-31
  Administered 2022-12-31: 2 mL via INTRA_ARTICULAR

## 2022-12-31 NOTE — Progress Notes (Signed)
No injury Does complain of swelling Was told she has fluid on her knee Tylenol for pain- minimal relief

## 2022-12-31 NOTE — Addendum Note (Signed)
Addended by: Jamie Kato III on: 12/31/2022 11:01 PM   Modules accepted: Level of Service

## 2022-12-31 NOTE — Progress Notes (Addendum)
Suzanne Graham - 42 y.o. female MRN [id number]  Date of birth: 10-27-1980  Office Visit Note: Visit Date: 12/31/2022 PCP: Ivonne Andrew, NP Referred by: Ivonne Andrew, NP  Subjective: Chief Complaint  Patient presents with   Left Knee - Pain   HPI: Suzanne Graham is a pleasant 42 y.o. female who presents today for acute on chronic left knee pain with swelling.  She has had a few months of left knee pain and swelling, was previously put on oral prednisone by her PCP which transiently helped the knee pain and swelling but then when she completed this data returned.  Feels tightness and pressure in the knee.  No redness, no fever or chills.  She has a history of gout both in the great toe as well as in her knees.  She is on colchcine 0.6 mg daily. Had been on allopurinol in the past, but was taken off this by her PCP. Reports history of gout in great toe and knee in the past.  Pertinent ROS were reviewed with the patient and found to be negative unless otherwise specified above in HPI.   Assessment & Plan: Visit Diagnoses:  1. Unilateral primary osteoarthritis, left knee   2. Effusion, left knee   3. Idiopathic chronic gout of multiple sites without tophus    Plan: Discussed with Keyaira today that on review of her knee x-rays she does have end-stage arthritic change of the patellofemoral joint and medial tibiofemoral compartment, as well as a history of gout (of great toe and knee). She does have an effusion on the knee today -we discussed all treatment options including oral medication, aspiration/injection therapy, physical therapy.  Through shared decision-making, elected to proceed with ultrasound-guided aspiration and subsequent knee injection.  We did aspirate about 30 cc of brightly yellow synovial fluid, which was concerning for possible gout exacerbation versus underlying OA flare.  We did send this fluid off for synovial fluid analysis and culture to help guide our  management process.  He will continue her colchicine 0.6 mg daily, may use ice and compression for any postinjection pain for the knee.  I would like to see her back in about 2 weeks to see how the knee is doing and review her lab results from the synovial fluid analysis.  If this is indeed a gout flare, may have a discussion with her PCP regarding gout prophylaxis therapy (was on allopurinol previously?)  Follow-up: Return for f/u in 2-3 for f/u right knee and lab review.   Meds & Orders:  Meds ordered this encounter  Medications   bupivacaine (MARCAINE) 0.25 % (with pres) injection 2 mL   methylPREDNISolone acetate (DEPO-MEDROL) injection 80 mg    Orders Placed This Encounter  Procedures   Large Joint Inj: L knee   Anaerobic and Aerobic Culture   US Guided Needle Placement - No Linked Charges   Synovial Fluid Analysis, Complete     Procedures: Large Joint Inj: L knee on 12/31/2022 3:14 PM Indications: joint swelling, pain and diagnostic evaluation Details: 18 G 1.5 in needle, ultrasound-guided superolateral approach Medications: 2 mL bupivacaine 0.25 %; 80 mg methylPREDNISolone acetate 40 MG/ML Aspirate: clear and yellow; sent for lab analysis Outcome: tolerated well, no immediate complications  US-guided Knee Aspiration, Left: After discussion on risks/benefits/indications was provided, informed verbal consent was obtained and a timeout was performed, patient was lying supine on exam table. The knee was prepped with alcohol swab.  Utilizing superolateral approach, approximately 4 mL of lidocaine  1% was used for local anesthesia. Then using ultrasound guidance an 18g needle on 30cc syringe, 29 mL of bright yellow-clear fluid was aspirated from the knee. Utilizing the same portal under ultrasound guidance, the knee joint was then injected with 2:2 bupivicaine:depomedrol.  Patient tolerated procedure well without immediate complications.  Procedure, treatment alternatives, risks and  benefits explained, specific risks discussed. Consent was given by the patient. Immediately prior to procedure a time out was called to verify the correct patient, procedure, equipment, support staff and site/side marked as required. Patient was prepped and draped in the usual sterile fashion.             Clinical History: No specialty comments available.  She reports that she has been smoking cigarettes. She has a 1.8 pack-year smoking history. She has never used smokeless tobacco. No results for input(s): "HGBA1C", "LABURIC" in the last 8760 hours.  Objective:   Vital Signs: LMP 12/18/2022   Physical Exam  Gen: Well-appearing, in no acute distress; non-toxic CV: Well-perfused. Warm.  Resp: Breathing unlabored on room air; no wheezing. Psych: Fluid speech in conversation; appropriate affect; normal thought process Neuro: Sensation intact throughout. No gross coordination deficits.   Ortho Exam - Left knee: There is medial greater than lateral joint line TTP, there is a moderate effusion on the knee with mild warmth, no redness.  Range of motion from 0-125 degrees compared to 0-135 degrees of the contralateral knee.  There is no varus or valgus instability, ligamentously intact.  Imaging: *Independent review and interpretation of 4 view left knee x-ray from 12/20/2022 was reviewed and interpreted by myself today.  AP, oblique and lateral film was interpreted.  X-rays demonstrate essentially bone-on-bone collapse of the medial tibiofemoral joint space, bone-on-bone severe arthritic change at the patellofemoral joint with significant spurring and bony sclerosis.  There is tricompartmental arthritis noted.  Likely joint effusion noted, otherwise no acute bony abnormality noted.  DG Knee Complete 4 Views Left CLINICAL DATA:  Left knee pain for 2 months  EXAM: LEFT KNEE - COMPLETE 4+ VIEW  COMPARISON:  09/27/2020  FINDINGS: Frontal, bilateral oblique, lateral views of the left knee  are obtained. No acute fracture, subluxation, or dislocation. There is moderate to severe 3 compartmental osteoarthritis, greatest in the medial and patellofemoral compartments. No significant change since prior exam. Small joint effusion, stable and likely reactive. Soft tissues are unremarkable.  IMPRESSION: 1. Stable moderate to severe 3 compartmental osteoarthritis. 2. Stable small joint effusion, likely reactive. 3. No acute fracture.  Electronically Signed   By: Sharlet Salina M.D.   On: 12/26/2022 15:57   Past Medical/Family/Surgical/Social History: Medications & Allergies reviewed per EMR, new medications updated. Patient Active Problem List   Diagnosis Date Noted   Bursitis of left knee 11/01/2022   Dental abscess 09/19/2022   High risk medication use 06/22/2021   Systemic inflammatory response syndrome (HCC) 03/31/2021   Inflammatory arthritis 03/29/2021   CKD (chronic kidney disease), stage III (HCC) 03/29/2021   Microcytic anemia 03/29/2021   AKI (acute kidney injury) (HCC) 03/11/2021   Adverse reaction to non-steroidal anti-inflammatory drug (NSAID), initial encounter 03/11/2021   Vitamin D deficiency 11/17/2019   Chronic gout of foot 10/11/2018   Current smoker 10/11/2018   Generalized pain 10/11/2018   Chronic nonintractable headache 08/06/2017   Essential hypertension 08/06/2017   Stroke (HCC) 07/17/2017   Past Medical History:  Diagnosis Date   Acid reflux 08/2019   Asthma    Depression    Gout  Hypertension    Stroke [facility])    Family History  Problem Relation Age of Onset   Heart failure Mother    Hypertension Mother    Stroke Neg Hx    Past Surgical History:  Procedure Laterality Date   LOOP RECORDER INSERTION N/A 08/19/2017   Procedure: LOOP RECORDER INSERTION;  Surgeon: Hillis Range, MD;  Location: [facility] INVASIVE CV LAB;  Service: Cardiovascular;  Laterality: N/A;   TEE WITHOUT CARDIOVERSION N/A 08/19/2017   Procedure: TRANSESOPHAGEAL  ECHOCARDIOGRAM (TEE);  Surgeon: Pricilla Riffle, MD;  Location: [facility] ENDOSCOPY;  Service: Cardiovascular;  Laterality: N/A;   Social History   Occupational History   Not on file  Tobacco Use   Smoking status: Every Day    Current packs/day: 0.25    Average packs/day: 0.3 packs/day for 7.0 years (1.8 ttl pk-yrs)    Types: Cigarettes   Smokeless tobacco: Never  Vaping Use   Vaping status: Never Used  Substance and Sexual Activity   Alcohol use: Yes    Comment: rare   Drug use: No   Sexual activity: Not Currently

## 2023-01-01 ENCOUNTER — Telehealth: Payer: Self-pay | Admitting: Sports Medicine

## 2023-01-01 NOTE — Telephone Encounter (Signed)
It is ready at my desk for pick up

## 2023-01-01 NOTE — Telephone Encounter (Addendum)
Patient called asked if she can get a note stating she was at the doctor's office yesterday and out of work today. Patient advised she will pick note up from the front desk. The number to contact patient is [phone]

## 2023-01-05 LAB — SYNOVIAL FLUID ANALYSIS, COMPLETE
Basophils, %: 0 %
Eosinophils-Synovial: 0 % (ref 0–2)
Lymphocytes-Synovial Fld: 10 % (ref 0–74)
Monocyte/Macrophage: 44 % (ref 0–69)
Neutrophil, Synovial: 46 % — ABNORMAL HIGH (ref 0–24)
Synoviocytes, %: 0 % (ref 0–15)
WBC, Synovial: 952 cells/uL — ABNORMAL HIGH (ref <150)

## 2023-01-05 LAB — ANAEROBIC AND AEROBIC CULTURE
AER RESULT:: NO GROWTH
MICRO NUMBER:: 15124837
MICRO NUMBER:: 15124838
SPECIMEN QUALITY:: ADEQUATE
SPECIMEN QUALITY:: ADEQUATE

## 2023-01-28 ENCOUNTER — Encounter: Payer: Self-pay | Admitting: Sports Medicine

## 2023-01-28 ENCOUNTER — Ambulatory Visit: Payer: Medicaid Other | Admitting: Sports Medicine

## 2023-01-28 DIAGNOSIS — M25462 Effusion, left knee: Secondary | ICD-10-CM

## 2023-01-28 DIAGNOSIS — M25562 Pain in left knee: Secondary | ICD-10-CM | POA: Diagnosis not present

## 2023-01-28 DIAGNOSIS — M1712 Unilateral primary osteoarthritis, left knee: Secondary | ICD-10-CM | POA: Diagnosis not present

## 2023-01-28 DIAGNOSIS — M1A09X0 Idiopathic chronic gout, multiple sites, without tophus (tophi): Secondary | ICD-10-CM

## 2023-01-28 DIAGNOSIS — N182 Chronic kidney disease, stage 2 (mild): Secondary | ICD-10-CM

## 2023-01-28 MED ORDER — MELOXICAM 15 MG PO TABS: 15.0000 mg | ORAL_TABLET | Freq: Every day | ORAL | 0 refills | Status: DC

## 2023-01-28 NOTE — Progress Notes (Signed)
Left knee follow up Was doing ok until about 3 days ago Pain goes down into lower leg Minimal swelling Tylenol for pain- no relief

## 2023-01-28 NOTE — Progress Notes (Signed)
Suzanne Graham - 42 y.o. female MRN [id number]  Date of birth: 05-Oct-1980  Office Visit Note: Visit Date: 01/28/2023 PCP: Suzanne Andrew, NP Referred by: Suzanne Andrew, NP  Subjective: Chief Complaint  Patient presents with   Left Knee - Pain   HPI: Suzanne Graham is a pleasant 42 y.o. female who presents today for follow-up of left knee pain with known OA, as well as gout.  Last seen on 12/31/2022, she did have a large effusion which we aspirated -did review aerobic and anaerobic culture which did not show any evidence of bacterial infection, synovial fluid analysis did show inflammatory effusion with monosodium urate crystals, indicative of gout.  Does have a history of gout in past in multiple locations (knee, foot).  Lab Results  Component Value Date   LABURIC 6.2 10/23/2021   -Uric acid on 07/25/2021 was 7.2, previous uric acid was 12.0 on 06/22/2021. -She is currently on colchicine 0.6 mg daily.  In the past she has been on allopurinol 300 mg, she tells me today that this was discontinued by her primary care physician.  Unclear exactly why.  Knee pain was largely improved after previous aspiration and injection.  Here over the last few days has started to swell slightly as well as cause her pain.  Pain with walking and prolonged standing.  She does use Tylenol for pain relief which is minimally effective.  Pertinent ROS were reviewed with the patient and found to be negative unless otherwise specified above in HPI.   Assessment & Plan: Visit Diagnoses:  1. Unilateral primary osteoarthritis, left knee   2. Idiopathic chronic gout of multiple sites without tophus   3. CKD (chronic kidney disease), stage II   4. Pain and swelling of left knee    Plan: Discussed with Suzanne Graham that her left knee pain is multifactorial as she does have advanced knee osteoarthritis, but recently had an exacerbation and flare of her gout which we treated with aspiration and injection.  Her  previous uric acid levels were slightly above goal.  She was taken off allopurinol 300 mg in the past by her PCP.  She may continue her colchicine 0.6 mg daily, but I think it would be wise to place her back on allopurinol.  She will notify her PCP and let them make this decision.  If she is back on allopurinol, would likely start at 300 mg and we will plan on rechecking a uric acid level about 6 weeks after starting this.  Discussed treatment options for the knee such as repeat corticosteroid injection one-time versus trialing a short-term anti-inflammatory.  She does have stage II CKD with her last EGFR of 70.  We will start her back on meloxicam to be taken once daily 15 mg for only the next 3 weeks and then discontinue to minimize long-term effects.  She will notify me if she is placed back on allopurinol 100 like to see her for follow-up about 6 weeks to see how the knee is doing as well as repeat uric acid levels.  She may call or return sooner if any issues arise.  Follow-up: Return for will notify me of PCP recommendation on allopurinol; f/u 6 weeks after starting med.   Meds & Orders:  Meds ordered this encounter  Medications   meloxicam (MOBIC) 15 MG tablet    Sig: Take 1 tablet (15 mg total) by mouth daily.    Dispense:  21 tablet    Refill:  0  No orders of the defined types were placed in this encounter.    Procedures: No procedures performed      Clinical History: No specialty comments available.  She reports that she has been smoking cigarettes. She has a 1.8 pack-year smoking history. She has never used smokeless tobacco. No results for input(s): "HGBA1C", "LABURIC" in the last 8760 hours.  Objective:   Vital Signs: There were no vitals taken for this visit.  Physical Exam  Gen: Well-appearing, in no acute distress; non-toxic CV: Well-perfused. Warm.  Resp: Breathing unlabored on room air; no wheezing. Psych: Fluid speech in conversation; appropriate affect; normal  thought process Neuro: Sensation intact throughout. No gross coordination deficits.   Ortho Exam - Left knee: There is a small effusion on the knee without significant warmth or redness.  Positive TTP over the medial joint line.  Range of motion slightly limited from 0-125 degrees.  Ligaments are intact.  Imaging:  DG Knee Complete 4 Views Left CLINICAL DATA:  Left knee pain for 2 months   EXAM: LEFT KNEE - COMPLETE 4+ VIEW   COMPARISON:  09/27/2020   FINDINGS: Frontal, bilateral oblique, lateral views of the left knee are obtained. No acute fracture, subluxation, or dislocation. There is moderate to severe 3 compartmental osteoarthritis, greatest in the medial and patellofemoral compartments. No significant change since prior exam. Small joint effusion, stable and likely reactive. Soft tissues are unremarkable.   IMPRESSION: 1. Stable moderate to severe 3 compartmental osteoarthritis. 2. Stable small joint effusion, likely reactive. 3. No acute fracture.   Electronically Signed   By: Suzanne Graham M.D.   On: 12/26/2022 15:57  Past Medical/Family/Surgical/Social History: Medications & Allergies reviewed per EMR, new medications updated. Patient Active Problem List   Diagnosis Date Noted   Bursitis of left knee 11/01/2022   Dental abscess 09/19/2022   High risk medication use 06/22/2021   Systemic inflammatory response syndrome (HCC) 03/31/2021   Inflammatory arthritis 03/29/2021   CKD (chronic kidney disease), stage III (HCC) 03/29/2021   Microcytic anemia 03/29/2021   AKI (acute kidney injury) (HCC) 03/11/2021   Adverse reaction to non-steroidal anti-inflammatory drug (NSAID), initial encounter 03/11/2021   Vitamin D deficiency 11/17/2019   Chronic gout of foot 10/11/2018   Current smoker 10/11/2018   Generalized pain 10/11/2018   Chronic nonintractable headache 08/06/2017   Essential hypertension 08/06/2017   Stroke (HCC) 07/17/2017   Past Medical History:   Diagnosis Date   Acid reflux 08/2019   Asthma    Depression    Gout    Hypertension    Stroke (HCC)    Family History  Problem Relation Age of Onset   Heart failure Mother    Hypertension Mother    Stroke Neg Hx    Past Surgical History:  Procedure Laterality Date   LOOP RECORDER INSERTION N/A 08/19/2017   Procedure: LOOP RECORDER INSERTION;  Surgeon: Hillis Range, MD;  Location: [facility] INVASIVE CV LAB;  Service: Cardiovascular;  Laterality: N/A;   TEE WITHOUT CARDIOVERSION N/A 08/19/2017   Procedure: TRANSESOPHAGEAL ECHOCARDIOGRAM (TEE);  Surgeon: Pricilla Riffle, MD;  Location: [facility] ENDOSCOPY;  Service: Cardiovascular;  Laterality: N/A;   Social History   Occupational History   Not on file  Tobacco Use   Smoking status: Every Day    Current packs/day: 0.25    Average packs/day: 0.3 packs/day for 7.0 years (1.8 ttl pk-yrs)    Types: Cigarettes   Smokeless tobacco: Never  Vaping Use   Vaping status: Never Used  Substance and Sexual Activity   Alcohol use: Yes    Comment: rare   Drug use: No   Sexual activity: Not Currently

## 2023-02-07 ENCOUNTER — Ambulatory Visit (INDEPENDENT_AMBULATORY_CARE_PROVIDER_SITE_OTHER): Payer: 59 | Admitting: Nurse Practitioner

## 2023-02-07 ENCOUNTER — Encounter: Payer: Self-pay | Admitting: Nurse Practitioner

## 2023-02-07 VITALS — BP 103/68 | HR 75 | Temp 97.2°F | Wt 171.0 lb

## 2023-02-07 DIAGNOSIS — M10471 Other secondary gout, right ankle and foot: Secondary | ICD-10-CM | POA: Diagnosis not present

## 2023-02-07 DIAGNOSIS — M109 Gout, unspecified: Secondary | ICD-10-CM | POA: Insufficient documentation

## 2023-02-07 DIAGNOSIS — M1712 Unilateral primary osteoarthritis, left knee: Secondary | ICD-10-CM

## 2023-02-07 MED ORDER — ALLOPURINOL 300 MG PO TABS
300.0000 mg | ORAL_TABLET | Freq: Every day | ORAL | 1 refills | Status: DC
Start: 2023-02-07 — End: 2023-06-10

## 2023-02-07 NOTE — Patient Instructions (Addendum)
1. 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  Dispense: 90 tablet; Refill: 1    It is important that you exercise regularly at least 30 minutes 5 times a week as tolerated  Think about what you will eat, plan ahead. Choose " clean, green, fresh or frozen" over canned, processed or packaged foods which are more sugary, salty and fatty. 70 to 75% of food eaten should be vegetables and fruit. Three meals at set times with snacks allowed between meals, but they must be fruit or vegetables. Aim to eat over a 12 hour period , example 7 am to 7 pm, and STOP after  your last meal of the day. Drink water,generally about 64 ounces per day, no other drink is as healthy. Fruit juice is best enjoyed in a healthy way, by EATING the fruit.  Thanks for choosing [facility] we consider it a privelige to serve you.

## 2023-02-07 NOTE — Assessment & Plan Note (Signed)
Continue meloxicam 15 mg daily, alternate with Tylenol 650 mg every 6 hours as needed Patient encouraged to follow-up with orthopedics, may benefit from a knee joint injection

## 2023-02-07 NOTE — Progress Notes (Signed)
Acute Office Visit  Subjective:     Patient ID: Suzanne Graham, female    DOB: 1980-10-15, 42 y.o.   MRN: [id number]  Chief Complaint  Patient presents with   Gout    Left lower leg from knee down    HPI Suzanne Graham  has a past medical history of Acid reflux (08/2019), Asthma, Depression, Gout, Hypertension, and Stroke (HCC).  Patient presents with complaints of chronic left knee pain worse in the past 3 days.  She was evaluated by orthopedics on 01/28/2023, of note patient has advanced knee osteoarthritis, recently had aspiration and injection.  She was started back on meloxicam 15 mg daily and was advised to follow-up with PCP to restart her allopurinol which she was taking for gout.  She denies leg pain at rest states that her knee pain can be up to 8/10 when walking.  No complaints of fever, chills, chest pain, shortness of breath.    Review of Systems  Constitutional:  Negative for activity change, appetite change, chills, fatigue and fever.  HENT:  Negative for congestion, dental problem, ear discharge, ear pain, hearing loss, rhinorrhea, sinus pressure, sinus pain, sneezing and sore throat.   Eyes:  Negative for pain, discharge, redness and itching.  Respiratory:  Negative for cough, chest tightness, shortness of breath and wheezing.   Cardiovascular:  Negative for chest pain, palpitations and leg swelling.  Gastrointestinal:  Negative for abdominal distention, abdominal pain, anal bleeding, blood in stool, constipation, diarrhea, nausea, rectal pain and vomiting.  Endocrine: Negative for cold intolerance, heat intolerance, polydipsia, polyphagia and polyuria.  Genitourinary:  Negative for difficulty urinating, dysuria, flank pain, frequency, hematuria, menstrual problem, pelvic pain and vaginal bleeding.  Musculoskeletal:  Positive for arthralgias. Negative for back pain, gait problem, joint swelling and myalgias.  Skin:  Negative for color change, pallor, rash and wound.   Allergic/Immunologic: Negative for environmental allergies, food allergies and immunocompromised state.  Neurological:  Negative for dizziness, tremors, facial asymmetry, weakness and headaches.  Hematological:  Negative for adenopathy. Does not bruise/bleed easily.  Psychiatric/Behavioral:  Negative for agitation, behavioral problems, confusion, decreased concentration, hallucinations, self-injury and suicidal ideas.         Objective:    BP 103/68   Pulse 75   Temp (!) 97.2 F (36.2 C)   Wt 171 lb (77.6 kg)   SpO2 100%   BMI 29.35 kg/m    Physical Exam Vitals and nursing note reviewed.  Constitutional:      General: She is not in acute distress.    Appearance: Normal appearance. She is not ill-appearing, toxic-appearing or diaphoretic.  HENT:     Mouth/Throat:     Mouth: Mucous membranes are moist.     Pharynx: Oropharynx is clear. No oropharyngeal exudate or posterior oropharyngeal erythema.  Eyes:     General: No scleral icterus.       Right eye: No discharge.        Left eye: No discharge.     Extraocular Movements: Extraocular movements intact.     Conjunctiva/sclera: Conjunctivae normal.  Cardiovascular:     Rate and Rhythm: Normal rate and regular rhythm.     Pulses: Normal pulses.     Heart sounds: Normal heart sounds. No murmur heard.    No friction rub. No gallop.  Pulmonary:     Effort: Pulmonary effort is normal. No respiratory distress.     Breath sounds: Normal breath sounds. No stridor. No wheezing, rhonchi or rales.  Chest:  Chest wall: No tenderness.  Abdominal:     General: There is no distension.     Palpations: Abdomen is soft.     Tenderness: There is no abdominal tenderness. There is no right CVA tenderness, left CVA tenderness or guarding.  Musculoskeletal:        General: No swelling, tenderness, deformity or signs of injury.     Right lower leg: No edema.     Left lower leg: No edema.     Comments: No swelling or redness noted on  examination, skin warm and dry, denies tenderness on range of motion of the left knee while sitting  Skin:    General: Skin is warm and dry.     Capillary Refill: Capillary refill takes less than 2 seconds.     Coloration: Skin is not jaundiced or pale.     Findings: No bruising, erythema or lesion.  Neurological:     Mental Status: She is alert and oriented to person, place, and time.     Motor: No weakness.     Coordination: Coordination normal.     Gait: Gait normal.  Psychiatric:        Mood and Affect: Mood normal.        Behavior: Behavior normal.        Thought Content: Thought content normal.        Judgment: Judgment normal.     No results found for any visits on 02/07/23.      Assessment & Plan:   Problem List Items Addressed This Visit       Musculoskeletal and Integument   Primary osteoarthritis of left knee - Primary    Continue meloxicam 15 mg daily, alternate with Tylenol 650 mg every 6 hours as needed Patient encouraged to follow-up with orthopedics, may benefit from a knee joint injection      Relevant Medications   allopurinol (ZYLOPRIM) 300 MG tablet   Gout involving toe of right foot    Please start allopurinol 300 mg daily Only take colchicine as needed for gout flareup She will follow-up with PCP in 6 weeks, will need uric level acid at that visit Education on gout prevention completed      Relevant Medications   allopurinol (ZYLOPRIM) 300 MG tablet    Meds ordered this encounter  Medications   allopurinol (ZYLOPRIM) 300 MG tablet    Sig: Take 1 tablet (300 mg total) by mouth daily.    Dispense:  90 tablet    Refill:  1    Return in about 6 weeks (around 03/21/2023).  Donell Beers, FNP

## 2023-02-07 NOTE — Assessment & Plan Note (Signed)
Please start allopurinol 300 mg daily Only take colchicine as needed for gout flareup She will follow-up with PCP in 6 weeks, will need uric level acid at that visit Education on gout prevention completed

## 2023-02-15 ENCOUNTER — Other Ambulatory Visit: Payer: Self-pay | Admitting: Nurse Practitioner

## 2023-02-15 DIAGNOSIS — M1A0790 Idiopathic chronic gout, unspecified ankle and foot, without tophus (tophi): Secondary | ICD-10-CM

## 2023-03-13 ENCOUNTER — Ambulatory Visit: Payer: Self-pay | Admitting: Nurse Practitioner

## 2023-03-13 ENCOUNTER — Emergency Department (HOSPITAL_COMMUNITY)
Admission: EM | Admit: 2023-03-13 | Discharge: 2023-03-13 | Disposition: A | Discharge status: Home / Self Care | Payer: 59 | Attending: Emergency Medicine | Admitting: Emergency Medicine

## 2023-03-13 ENCOUNTER — Other Ambulatory Visit: Payer: Self-pay

## 2023-03-13 ENCOUNTER — Encounter (HOSPITAL_COMMUNITY): Payer: Self-pay

## 2023-03-13 DIAGNOSIS — M549 Dorsalgia, unspecified: Secondary | ICD-10-CM | POA: Diagnosis not present

## 2023-03-13 DIAGNOSIS — Y92410 Unspecified street and highway as the place of occurrence of the external cause: Secondary | ICD-10-CM | POA: Insufficient documentation

## 2023-03-13 DIAGNOSIS — M542 Cervicalgia: Secondary | ICD-10-CM | POA: Insufficient documentation

## 2023-03-13 MED ORDER — METHOCARBAMOL 500 MG PO TABS: 1000.0000 mg | ORAL_TABLET | Freq: Three times a day (TID) | ORAL | 0 refills | Status: DC | PRN
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
## 2023-03-17 ENCOUNTER — Ambulatory Visit: Payer: 59 | Admitting: Sports Medicine

## 2023-03-20 ENCOUNTER — Encounter: Payer: Self-pay | Admitting: Nurse Practitioner

## 2023-03-20 ENCOUNTER — Ambulatory Visit (INDEPENDENT_AMBULATORY_CARE_PROVIDER_SITE_OTHER): Payer: 59 | Admitting: Nurse Practitioner

## 2023-03-20 VITALS — BP 114/73 | HR 71 | Temp 97.0°F | Wt 166.0 lb

## 2023-03-20 DIAGNOSIS — M25462 Effusion, left knee: Secondary | ICD-10-CM | POA: Insufficient documentation

## 2023-03-20 DIAGNOSIS — M542 Cervicalgia: Secondary | ICD-10-CM | POA: Diagnosis not present
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
## 2023-03-20 NOTE — Assessment & Plan Note (Signed)
-  please follow with ortho ASAP   2. Neck pain on right side  - may continue Robaxin 500 mg as needed  -May use heat and alternate with ice packs.   Follow up:  Follow up as scheduled

## 2023-03-21 ENCOUNTER — Ambulatory Visit: Payer: Self-pay | Admitting: Nurse Practitioner

## 2023-04-10 ENCOUNTER — Encounter: Payer: Self-pay | Admitting: Sports Medicine

## 2023-04-10 ENCOUNTER — Ambulatory Visit (INDEPENDENT_AMBULATORY_CARE_PROVIDER_SITE_OTHER): Payer: 59 | Admitting: Sports Medicine

## 2023-04-10 DIAGNOSIS — M1A09X0 Idiopathic chronic gout, multiple sites, without tophus (tophi): Secondary | ICD-10-CM

## 2023-04-10 DIAGNOSIS — M1712 Unilateral primary osteoarthritis, left knee: Secondary | ICD-10-CM

## 2023-04-10 DIAGNOSIS — M1A0620 Idiopathic chronic gout, left knee, without tophus (tophi): Secondary | ICD-10-CM

## 2023-04-10 NOTE — Progress Notes (Signed)
Patient says that her knee is still aching. She says that since her last visit it does not feel any better or worse, and location of pain is the same. She says that she talked to her PCP about the allopurinol and her PCP wanted to know more about it, so she has not taken it between her last visit and now.

## 2023-04-10 NOTE — Progress Notes (Signed)
Suzanne Graham - 42 y.o. female MRN [id number]  Date of birth: 05/15/1981  Office Visit Note: Visit Date: 04/10/2023 PCP: Ivonne Andrew, NP Referred by: Ivonne Andrew, NP  Subjective: Chief Complaint  Patient presents with   Left Knee - Follow-up, Pain   HPI: Suzanne Graham is a pleasant 42 y.o. female who presents today for chronic left knee pain with effusions and gout follow-up.  Left knee - has advanced osteoarthritic change tricompartmentally.  She had a large effusion on 12/31/2022 and we did perform aspiration and send this for fluid studies which did show monosodium urate crystals indicative of a gout flare.  She has been managed on colchicine 0.6 mg daily and has been on allopurinol in the past but this was discontinued.  Recently her knee pain has been stable, only has pain with standing for long periods of time.  Taking Tylenol as needed.  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  Lab Results  Component Value Date   LABURIC 6.2 10/23/2021   Uric acid 07/25/21 - 7.2  Uric acid 06/22/21 - 12.0  Pertinent ROS were reviewed with the patient and found to be negative unless otherwise specified above in HPI.   Assessment & Plan: Visit Diagnoses:  1. Unilateral primary osteoarthritis, left knee   2. Idiopathic chronic gout of multiple sites without tophus   3. Chronic gout of left knee, unspecified cause    Plan: Discussed with Amberly the nature of her chronic left knee pain with previous effusions.  This is multifactorial as she has advanced knee osteoarthritis tricompartmentally, however back in June she had a large effusion which we aspirated and did return for monosodium urate crystals indicative of gout.  She has had gout in multiple other locations as well.  She previously was on allopurinol but has stopped this and she takes colchicine 0.6 mg daily.  Given her breakthrough gout  flare, I would like her to resume her allopurinol 300mg  in addition.  She may continue colchicine 0.6 mg daily as well during this time.  She may not need 300 mg but we will see how her uric acid levels reduce once being on this.  She will follow-up with me in 6 weeks and we will recheck the knee and repeat uric acid levels.  She will continue to remain active, work on range of motion and flexibility about the knee joint.  Could consider PT or home exercise program in the future.   Follow-up: Return in about 6 weeks (around 05/22/2023) for for left knee and gout (needs uric acid drawn).   Meds & Orders: No orders of the defined types were placed in this encounter.  No orders of the defined types were placed in this encounter.    Procedures: No procedures performed      Clinical History: No specialty comments available.  She reports that she has been smoking cigarettes. She has a 1.8 pack-year smoking history. She has never used smokeless tobacco. No results for input(s): "HGBA1C", "LABURIC" in the last 8760 hours.  Objective:     Physical Exam  Gen: Well-appearing, in no acute distress; non-toxic CV: Well-perfused. Warm.  Resp: Breathing unlabored on room air; no wheezing. Psych: Fluid speech in conversation; appropriate affect; normal thought process Neuro: Sensation intact throughout. No gross coordination deficits.   Ortho Exam - Left knee: Evaluation of the left knee joint  shows a trace to small effusion without warmth or redness.  Range of motion is restricted from about 5-95 degrees.  There is no varus or valgus instability although some pseudo instability with varus stress.   - Right elbow: Small effusion of the right olecranon bursa without redness, warmth or pain.  Full range of motion of the elbow.  Imaging:  DG Knee Complete 4 Views Left CLINICAL DATA:  Left knee pain for 2 months  EXAM: LEFT KNEE - COMPLETE 4+ VIEW  COMPARISON:  09/27/2020  FINDINGS: Frontal,  bilateral oblique, lateral views of the left knee are obtained. No acute fracture, subluxation, or dislocation. There is moderate to severe 3 compartmental osteoarthritis, greatest in the medial and patellofemoral compartments. No significant change since prior exam. Small joint effusion, stable and likely reactive. Soft tissues are unremarkable.  IMPRESSION: 1. Stable moderate to severe 3 compartmental osteoarthritis. 2. Stable small joint effusion, likely reactive. 3. No acute fracture.  Electronically Signed   By: Sharlet Salina M.D.   On: 12/26/2022 15:57    Past Medical/Family/Surgical/Social History: Medications & Allergies reviewed per EMR, new medications updated. Patient Active Problem List   Diagnosis Date Noted   Effusion of left knee 03/20/2023   Neck pain on right side 03/20/2023   Primary osteoarthritis of left knee 02/07/2023   Gout involving toe of right foot 02/07/2023   Bursitis of left knee 11/01/2022   Dental abscess 09/19/2022   High risk medication use 06/22/2021   Systemic inflammatory response syndrome (HCC) 03/31/2021   Inflammatory arthritis 03/29/2021   CKD (chronic kidney disease), stage III (HCC) 03/29/2021   Microcytic anemia 03/29/2021   AKI (acute kidney injury) (HCC) 03/11/2021   Adverse reaction to non-steroidal anti-inflammatory drug (NSAID), initial encounter 03/11/2021   Vitamin D deficiency 11/17/2019   Chronic gout of foot 10/11/2018   Current smoker 10/11/2018   Generalized pain 10/11/2018   Chronic nonintractable headache 08/06/2017   Essential hypertension 08/06/2017   Stroke (HCC) 07/17/2017   Past Medical History:  Diagnosis Date   Acid reflux 08/2019   Asthma    Depression    Gout    Hypertension    Stroke (HCC)    Family History  Problem Relation Age of Onset   Heart failure Mother    Hypertension Mother    Stroke Neg Hx    Past Surgical History:  Procedure Laterality Date   LOOP RECORDER INSERTION N/A 08/19/2017    Procedure: LOOP RECORDER INSERTION;  Surgeon: Hillis Range, MD;  Location: [facility];  Service: Cardiovascular;  Laterality: N/A;   TEE WITHOUT CARDIOVERSION N/A 08/19/2017   Procedure: TRANSESOPHAGEAL ECHOCARDIOGRAM (TEE);  Surgeon: Pricilla Riffle, MD;  Location: [facility] ENDOSCOPY;  Service: Cardiovascular;  Laterality: N/A;   Social History   Occupational History   Not on file  Tobacco Use   Smoking status: Every Day    Current packs/day: 0.25    Average packs/day: 0.3 packs/day for 7.0 years (1.8 ttl pk-yrs)    Types: Cigarettes   Smokeless tobacco: Never  Vaping Use   Vaping status: Never Used  Substance and Sexual Activity   Alcohol use: Yes    Comment: rare   Drug use: No   Sexual activity: Not Currently

## 2023-04-23 ENCOUNTER — Encounter: Payer: Self-pay | Admitting: Nurse Practitioner

## 2023-04-23 ENCOUNTER — Ambulatory Visit (INDEPENDENT_AMBULATORY_CARE_PROVIDER_SITE_OTHER): Payer: 59 | Admitting: Nurse Practitioner

## 2023-04-23 VITALS — BP 127/89 | HR 78 | Temp 98.5°F | Resp 14 | Ht 64.0 in | Wt 180.0 lb

## 2023-04-23 DIAGNOSIS — M109 Gout, unspecified: Secondary | ICD-10-CM | POA: Diagnosis not present

## 2023-04-23 MED ORDER — PREDNISONE 10 MG PO TABS
ORAL_TABLET | ORAL | 0 refills | Status: DC
Start: 2023-04-23 — End: 2023-05-26

## 2023-04-23 NOTE — Progress Notes (Signed)
Subjective   Patient ID: Suzanne Graham, female    DOB: 1981-02-18, 42 y.o.   MRN: [id number]  Chief Complaint  Patient presents with   Hand Pain    Referring provider: Ivonne Andrew, NP  Suzanne Graham is a 42 y.o. female with Past Medical History: 08/2019: Acid reflux No date: Asthma No date: Depression No date: Gout No date: Hypertension No date: Stroke [facility])  HPI  Patient presents today for an acute visit.  She states that she is having a gout flare to her left hand.  She states that this started 2 days ago.  She has seen Ortho recently for gout flares to her knees.  She colchicine 0.6 mg daily and orthopedics recently added allopurinol 300 mg daily.  Patient states that when she takes allopurinol causes increased gout flares.  We discussed that she should follow-up with orthopedics to see if they want to change his medication.  For now she can stop the allopurinol and we will trial a prednisone taper. Denies f/c/s, n/v/d, hemoptysis, PND, leg swelling Denies chest pain or edema   Pain in right hand 2 days ago.     Allergies  Allergen Reactions   Nsaids Other (See Comments)    Likely causing AKI     There is no immunization history on file for this patient.  Tobacco History: Social History   Tobacco Use  Smoking Status Every Day   Current packs/day: 0.25   Average packs/day: 0.3 packs/day for 7.0 years (1.8 ttl pk-yrs)   Types: Cigarettes  Smokeless Tobacco Never   Ready to quit: Not Answered Counseling given: Not Answered   Outpatient Encounter Medications as of 04/23/2023  Medication Sig   acetaminophen (TYLENOL) 500 MG tablet Take 500 mg by mouth every 6 (six) hours as needed for moderate pain or headache.   albuterol (VENTOLIN HFA) 108 (90 Base) MCG/ACT inhaler Inhale 2 puffs into the lungs every 6 (six) hours as needed for wheezing or shortness of breath.   allopurinol (ZYLOPRIM) 300 MG tablet Take 1 tablet (300 mg total) by mouth daily.    amLODipine (NORVASC) 10 MG tablet TAKE 1 TABLET BY MOUTH DAILY   colchicine 0.6 MG tablet TAKE 1 TABLET(0.6 MG) BY MOUTH TWICE DAILY AS NEEDED   meloxicam (MOBIC) 15 MG tablet Take 1 tablet (15 mg total) by mouth daily.   methocarbamol (ROBAXIN) 500 MG tablet Take 2 tablets (1,000 mg total) by mouth every 8 (eight) hours as needed for muscle spasms.   predniSONE (DELTASONE) 10 MG tablet Take 4 tabs for 2 days, then 3 tabs for 2 days, then 2 tabs for 2 days, then 1 tab for 2 days, then stop   No facility-administered encounter medications on file as of 04/23/2023.    Review of Systems  Review of Systems  Constitutional: Negative.   HENT: Negative.    Cardiovascular: Negative.   Gastrointestinal: Negative.   Musculoskeletal:        Left hand joint pain  Allergic/Immunologic: Negative.   Neurological: Negative.   Psychiatric/Behavioral: Negative.       Objective:   BP 127/89 (BP Location: Left Arm, Patient Position: Sitting, Cuff Size: Normal)   Pulse 78   Temp 98.5 F (36.9 C)   Resp 14   Ht 5\' 4"  (1.626 m)   Wt 180 lb (81.6 kg)   SpO2 100%   BMI 30.90 kg/m   Wt Readings from Last 5 Encounters:  04/23/23 180 lb (81.6 kg)  03/20/23 166 lb (  75.3 kg)  03/13/23 171 lb (77.6 kg)  02/07/23 171 lb (77.6 kg)  12/20/22 171 lb 12.8 oz (77.9 kg)     Physical Exam Vitals and nursing note reviewed.  Constitutional:      General: She is not in acute distress.    Appearance: She is well-developed.  Cardiovascular:     Rate and Rhythm: Normal rate and regular rhythm.  Pulmonary:     Effort: Pulmonary effort is normal.     Breath sounds: Normal breath sounds.  Musculoskeletal:     Left hand: Swelling and tenderness present. Decreased range of motion.  Neurological:     Mental Status: She is alert and oriented to person, place, and time.       Assessment & Plan:   Acute gout of left hand, unspecified cause -     predniSONE; Take 4 tabs for 2 days, then 3 tabs for 2  days, then 2 tabs for 2 days, then 1 tab for 2 days, then stop  Dispense: 20 tablet; Refill: 0     Return if symptoms worsen or fail to improve.   Ivonne Andrew, NP 04/23/2023

## 2023-04-23 NOTE — Patient Instructions (Signed)
1. Acute gout of left hand, unspecified cause  - predniSONE (DELTASONE) 10 MG tablet; Take 4 tabs for 2 days, then 3 tabs for 2 days, then 2 tabs for 2 days, then 1 tab for 2 days, then stop  Dispense: 20 tablet; Refill: 0  Follow up:  Follow up as scheduled

## 2023-05-01 DIAGNOSIS — J45909 Unspecified asthma, uncomplicated: Secondary | ICD-10-CM | POA: Diagnosis not present

## 2023-05-01 DIAGNOSIS — M109 Gout, unspecified: Secondary | ICD-10-CM | POA: Diagnosis not present

## 2023-05-01 DIAGNOSIS — I10 Essential (primary) hypertension: Secondary | ICD-10-CM | POA: Diagnosis not present

## 2023-05-01 DIAGNOSIS — Z809 Family history of malignant neoplasm, unspecified: Secondary | ICD-10-CM | POA: Diagnosis not present

## 2023-05-01 DIAGNOSIS — Z720 Tobacco use: Secondary | ICD-10-CM | POA: Diagnosis not present

## 2023-05-01 DIAGNOSIS — Z8249 Family history of ischemic heart disease and other diseases of the circulatory system: Secondary | ICD-10-CM | POA: Diagnosis not present

## 2023-05-01 DIAGNOSIS — K219 Gastro-esophageal reflux disease without esophagitis: Secondary | ICD-10-CM | POA: Diagnosis not present

## 2023-05-01 DIAGNOSIS — M1990 Unspecified osteoarthritis, unspecified site: Secondary | ICD-10-CM | POA: Diagnosis not present

## 2023-05-01 DIAGNOSIS — Z823 Family history of stroke: Secondary | ICD-10-CM | POA: Diagnosis not present

## 2023-05-01 DIAGNOSIS — Z8673 Personal history of transient ischemic attack (TIA), and cerebral infarction without residual deficits: Secondary | ICD-10-CM | POA: Diagnosis not present

## 2023-05-01 DIAGNOSIS — M064 Inflammatory polyarthropathy: Secondary | ICD-10-CM | POA: Diagnosis not present

## 2023-05-05 ENCOUNTER — Other Ambulatory Visit: Payer: Self-pay

## 2023-05-05 DIAGNOSIS — I10 Essential (primary) hypertension: Secondary | ICD-10-CM

## 2023-05-05 MED ORDER — AMLODIPINE BESYLATE 10 MG PO TABS
ORAL_TABLET | Freq: Every day | ORAL | 1 refills | Status: DC
Start: 2023-05-05 — End: 2023-12-25

## 2023-05-22 ENCOUNTER — Ambulatory Visit: Payer: 59 | Admitting: Sports Medicine

## 2023-05-23 ENCOUNTER — Ambulatory Visit (INDEPENDENT_AMBULATORY_CARE_PROVIDER_SITE_OTHER): Payer: 59 | Admitting: Sports Medicine

## 2023-05-23 ENCOUNTER — Encounter: Payer: Self-pay | Admitting: Sports Medicine

## 2023-05-23 DIAGNOSIS — R768 Other specified abnormal immunological findings in serum: Secondary | ICD-10-CM

## 2023-05-23 DIAGNOSIS — M25462 Effusion, left knee: Secondary | ICD-10-CM

## 2023-05-23 DIAGNOSIS — M25432 Effusion, left wrist: Secondary | ICD-10-CM

## 2023-05-23 DIAGNOSIS — R2232 Localized swelling, mass and lump, left upper limb: Secondary | ICD-10-CM | POA: Diagnosis not present

## 2023-05-23 DIAGNOSIS — M1A09X0 Idiopathic chronic gout, multiple sites, without tophus (tophi): Secondary | ICD-10-CM | POA: Diagnosis not present

## 2023-05-23 DIAGNOSIS — M2550 Pain in unspecified joint: Secondary | ICD-10-CM | POA: Diagnosis not present

## 2023-05-23 NOTE — Progress Notes (Signed)
Suzanne Graham - 42 y.o. female MRN [id number]  Date of birth: [birth year]  Office Visit Note: Visit Date: 05/23/2023 PCP: Ivonne Andrew, NP Referred by: Ivonne Andrew, NP  Subjective: Chief Complaint  Patient presents with   Left Knee - Follow-up   HPI: Suzanne Graham is a pleasant 42 y.o. female who presents today for follow-up of gout and prior left knee effusion. Also with left hand/wrist swelling and pain.  Since we performed the joint aspiration and subsequent injection the knee has been doing quite well.  Unfortunately has had acute recurrence left wrist and hand effusion.  She has been compliant to her allopurinol 300 mg and still has had these flares.  Was seen by PCP, Angus Seller, NP on 04/23/23 - note reviewed. Started on Prednisone taper x 6 days.    She is continuing colchicine 0.6 mg daily, has been adherent to her allopurinol 300 mg daily as well.  Lab Results  Component Value Date   LABURIC 6.2 10/23/2021   Uric acid 07/25/21 - 7.2   Uric acid 06/22/21 - 12.0  Independent chart review shows a history of positive ANA 5 years ago, but negative 2 years ago.  She has had elevated rheumatoid factor is both on 03/07/2021 and 03/30/2021 (26.3).  Pertinent ROS were reviewed with the patient and found to be negative unless otherwise specified above in HPI.   Assessment & Plan: Visit Diagnoses:  1. Idiopathic chronic gout of multiple sites without tophus   2. Polyarthralgia   3. Effusion, left knee   4. Localized swelling on left hand   5. Left wrist effusion   6. Rheumatoid factor positive    Plan: Discussed with Alzora the nature of her polyarthralgia with recurrent effusions and pain.  We have aspirated her knees on separate occurrences that both have shown gout as well as nongouty effusions.  She has been adherent to her allopurinol and has had to rest/pain effusion flares since this time.  We will check uric acid levels, but I am somewhat suspicious for  rheumatologic condition such as RA.  Chart review does show positive RF and ANA levels in the past.  For the meantime she will continue her colchicine 0.6 mg daily as well as her allopurinol 300 mg daily.  I will add a 6-day Medrol Dosepak for her current effusion and significant left wrist pain.  If her uric acid levels are lower in the normal range, she may benefit from trialing off the allopurinol as this more likely could be coming from RA or other rheumatologic conditions.  Will await lab results and will message her with next steps, this could include referral to rheumatology however.  Follow-up: Return for will message with plan after labs result.   Meds & Orders: No orders of the defined types were placed in this encounter.   Orders Placed This Encounter  Procedures   Uric acid   Sed Rate (ESR)   Antinuclear Antib (ANA)   Rheumatoid Factor   Anti-DNA antibody, double-stranded     Procedures: No procedures performed      Clinical History: No specialty comments available.  She reports that she has been smoking cigarettes. She has a 1.8 pack-year smoking history. She has never used smokeless tobacco. No results for input(s): "HGBA1C", "LABURIC" in the last 8760 hours.  Objective:    Physical Exam  Gen: Well-appearing, in no acute distress; non-toxic CV: Well-perfused. Warm.  Resp: Breathing unlabored on room air; no wheezing. Psych: Fluid speech  in conversation; appropriate affect; normal thought process Neuro: Sensation intact throughout. No gross coordination deficits.   Ortho Exam - Left knee: Range of motion from 0-125 degrees.  Mild bony bossing from OA but no significant joint effusion or warmth.  - Left hand and wrist: There is a moderate effusion of the left wrist and hand that significant warmth.  Mild degree of dactylitis.  There is no redness or cellulitic change.  There is limited range of motion secondary to swelling about the wrist.  Cap refill less than 2  seconds.  Imaging: No results found.  Past Medical/Family/Surgical/Social History: Medications & Allergies reviewed per EMR, new medications updated. Patient Active Problem List   Diagnosis Date Noted   Effusion of left knee 03/20/2023   Neck pain on right side 03/20/2023   Primary osteoarthritis of left knee 02/07/2023   Gout involving toe of right foot 02/07/2023   Bursitis of left knee 11/01/2022   Dental abscess 09/19/2022   High risk medication use 06/22/2021   Systemic inflammatory response syndrome (HCC) 03/31/2021   Inflammatory arthritis 03/29/2021   CKD (chronic kidney disease), stage III (HCC) 03/29/2021   Microcytic anemia 03/29/2021   AKI (acute kidney injury) (HCC) 03/11/2021   Adverse reaction to non-steroidal anti-inflammatory drug (NSAID), initial encounter 03/11/2021   Vitamin D deficiency 11/17/2019   Chronic gout of foot 10/11/2018   Current smoker 10/11/2018   Generalized pain 10/11/2018   Chronic nonintractable headache 08/06/2017   Essential hypertension 08/06/2017   Stroke (HCC) 07/17/2017   Past Medical History:  Diagnosis Date   Acid reflux 08/2019   Asthma    Depression    Gout    Hypertension    Stroke (HCC)    Family History  Problem Relation Age of Onset   Heart failure Mother    Hypertension Mother    Stroke Neg Hx    Past Surgical History:  Procedure Laterality Date   LOOP RECORDER INSERTION N/A 08/19/2017   Procedure: LOOP RECORDER INSERTION;  Surgeon: Hillis Range, MD;  Location: [facility] INVASIVE CV LAB;  Service: Cardiovascular;  Laterality: N/A;   TEE WITHOUT CARDIOVERSION N/A 08/19/2017   Procedure: TRANSESOPHAGEAL ECHOCARDIOGRAM (TEE);  Surgeon: Pricilla Riffle, MD;  Location: [facility] ENDOSCOPY;  Service: Cardiovascular;  Laterality: N/A;   Social History   Occupational History   Not on file  Tobacco Use   Smoking status: Every Day    Current packs/day: 0.25    Average packs/day: 0.3 packs/day for 7.0 years (1.8 ttl pk-yrs)     Types: Cigarettes   Smokeless tobacco: Never  Vaping Use   Vaping status: Never Used  Substance and Sexual Activity   Alcohol use: Yes    Comment: rare   Drug use: No   Sexual activity: Not Currently

## 2023-05-24 LAB — URIC ACID: Uric Acid, Serum: 9.4 mg/dL — ABNORMAL HIGH (ref 2.5–7.0)

## 2023-05-25 LAB — ANTI-NUCLEAR AB-TITER (ANA TITER): ANA Titer 1: 1:1280 {titer} — ABNORMAL HIGH

## 2023-05-25 LAB — ANTI-DNA ANTIBODY, DOUBLE-STRANDED: ds DNA Ab: <1 [IU]/mL

## 2023-05-25 LAB — SEDIMENTATION RATE: Sed Rate: 45 mm/h — ABNORMAL HIGH (ref 0–20)

## 2023-05-25 LAB — RHEUMATOID FACTOR: Rheumatoid fact SerPl-aCnc: <10 [IU]/mL (ref <14)

## 2023-05-25 LAB — ANA: Anti Nuclear Antibody (ANA): POSITIVE — AB

## 2023-05-26 ENCOUNTER — Telehealth: Payer: Self-pay | Admitting: Sports Medicine

## 2023-05-26 ENCOUNTER — Encounter: Payer: Self-pay | Admitting: Sports Medicine

## 2023-05-26 MED ORDER — METHYLPREDNISOLONE 4 MG PO TBPK: ORAL_TABLET | ORAL | 0 refills | Status: DC

## 2023-05-26 NOTE — Addendum Note (Signed)
Addended by: Jamie Kato III on: 05/26/2023 02:34 PM   Modules accepted: Orders

## 2023-05-26 NOTE — Telephone Encounter (Signed)
Patient called and said that you sent the medication to the wrong pharmacy. [facility] on [address]. CB#[phone]

## 2023-06-02 ENCOUNTER — Telehealth: Payer: Self-pay | Admitting: Sports Medicine

## 2023-06-02 NOTE — Telephone Encounter (Signed)
Called patient and scheduled appointment for 06/10/2023 at 4:00pm.

## 2023-06-02 NOTE — Telephone Encounter (Signed)
Patient called and said yo called her to go over the bloodwork results. CB#[phone]

## 2023-06-10 ENCOUNTER — Encounter: Payer: Self-pay | Admitting: Sports Medicine

## 2023-06-10 ENCOUNTER — Ambulatory Visit (INDEPENDENT_AMBULATORY_CARE_PROVIDER_SITE_OTHER): Payer: 59 | Admitting: Sports Medicine

## 2023-06-10 DIAGNOSIS — M2550 Pain in unspecified joint: Secondary | ICD-10-CM

## 2023-06-10 DIAGNOSIS — R768 Other specified abnormal immunological findings in serum: Secondary | ICD-10-CM | POA: Diagnosis not present

## 2023-06-10 DIAGNOSIS — M1A09X0 Idiopathic chronic gout, multiple sites, without tophus (tophi): Secondary | ICD-10-CM | POA: Diagnosis not present

## 2023-06-10 DIAGNOSIS — E790 Hyperuricemia without signs of inflammatory arthritis and tophaceous disease: Secondary | ICD-10-CM

## 2023-06-10 DIAGNOSIS — M25432 Effusion, left wrist: Secondary | ICD-10-CM

## 2023-06-10 MED ORDER — PROBENECID 500 MG PO TABS: 500.0000 mg | ORAL_TABLET | Freq: Two times a day (BID) | ORAL | 1 refills | Status: DC

## 2023-06-10 NOTE — Progress Notes (Signed)
Suzanne Graham - 42 y.o. female MRN [id number]  Date of birth: May 20, 1981  Office Visit Note: Visit Date: 06/10/2023 PCP: Ivonne Andrew, NP Referred by: Ivonne Andrew, NP  Subjective: Chief Complaint  Patient presents with   Other   HPI: Suzanne Graham is a pleasant 42 y.o. female who presents today for follow-up of gout of multiple sites, rheumatologic lab review.  [name] has had multiple gout flares in the past which have responded well to aspiration and injection.  We have done this in the knee multiple times as well as a recent left wrist and hand effusion that was treated with a prednisone taper.  She currently has had resolution of her flares and is doing much better.  She still gets some pain in the fingers of her right hand more so than her left.  Uric acid review - 9.4 (05/23/23) --> 6.2 (10/23/21) --> 7.2 (07/25/21) --> 12.0 (06/22/21)  *In the past she had elevated uric acid levels but this has been lower since she has been taking her daily colchicine.  We did restart her allopurinol 300 mg on 02/07/2023 and she has been taking this faithfully but unfortunately has had 2 subsequent gouty flare exacerbations in lieu of this.  In the past she had been told by her primary physician that she had labs concerning for rheumatologic condition.  Independent chart review showed a history of positive ANA 5 years ago, was -2 years ago, but most recent lab was positive.  She has had elevated rheumatoid factor both on 03/07/2021 and 03/30/2021 (26.3)  Pertinent ROS were reviewed with the patient and found to be negative unless otherwise specified above in HPI.   Assessment & Plan: Visit Diagnoses:  1. Idiopathic chronic gout of multiple sites without tophus   2. Polyarthralgia   3. Left wrist effusion   4. Rheumatoid factor positive   5. Elevated uric acid in blood    Plan: [name] is now past her acute gouty flare of her left hand and wrist after the oral prednisone.  She continues  on colchicine 0.6 mg every day-BID.  She has had gout flares over the last few years of multiple sites including bilateral hands and wrist as well as knees.  Her uric acid levels have fluctuated over the years and she has been on allopurinol although not consistently.  Since the beginning of August she has been taking allopurinol 300 mg daily and has had 2 breakthrough acute gouty flares.  Given this, I am unsure of the benefit of allopurinol and would suggest she needs more of a uricosuric agent such as Probenacid to help control her uric acid levels. Ideally would need 24-hour urine to test, but for convenience we discussed discontinuing Allopurinol and starting Probenacid (250 mg BID x 1 week) 500mg  BID. I would like her uric acid levels to be retested in about 1 month after starting this medication.  Per chart review she has seen Dr. Dimple Casey in the past - she has had mixed rheumatologic labs including positive ANA, positive RF, elevated ESR most recently as well as in the past.  Given the complexity of this and her family history of rheumatoid arthritis, I would like to send her back to Dr. Dimple Casey and rheumatology to help rule out concomitant underlying RA or other rheumatologic conditions. I will send a message to both Dr. Dimple Casey as well as her PCP Angus Seller regarding her gout and treatment as want to have consistency of care regarding her ongoing  treatment. [name] is agreeable to this plan.  Follow-up: Return in about 1 month (around 07/11/2023) for for gout f/u (draw uric acid levels).   Meds & Orders:  Meds ordered this encounter  Medications   probenecid (BENEMID) 500 MG tablet    Sig: Take 1 tablet (500 mg total) by mouth 2 (two) times daily. Take 1/2 tablet twice a day for the first week, then may increase to 1 tablet twice daily    Dispense:  60 tablet    Refill:  1    Please cut 7 tablets in half to allow for dosing as above for the first week   No orders of the defined types were placed in  this encounter.    Procedures: No procedures performed      Clinical History: No specialty comments available.  She reports that she has been smoking cigarettes. She has a 1.8 pack-year smoking history. She has never used smokeless tobacco.  Recent Labs    05/23/23 1319  LABURIC 9.4*   Lab Results  Component Value Date   ANA POSITIVE (A) 05/23/2023   Lab Results  Component Value Date   RF <10 05/23/2023   Lab Results  Component Value Date   ESRSEDRATE 45 (H) 05/23/2023   *Labs independently reviewed by myself today during the visit  Objective:    Physical Exam  Gen: Well-appearing, in no acute distress; non-toxic CV: Well-perfused. Warm.  Resp: Breathing unlabored on room air; no wheezing. Psych: Fluid speech in conversation; appropriate affect; normal thought process Neuro: Sensation intact throughout. No gross coordination deficits.   Ortho Exam -Left hand/wrist: There is a trace effusion of the left wrist without significant warmth.  There is a mild degree of dactylitis noted of bilateral hands.  Cap refill less than 2 seconds.  Imaging: No results found.  Past Medical/Family/Surgical/Social History: Medications & Allergies reviewed per EMR, new medications updated. Patient Active Problem List   Diagnosis Date Noted   Effusion of left knee 03/20/2023   Neck pain on right side 03/20/2023   Primary osteoarthritis of left knee 02/07/2023   Gout involving toe of right foot 02/07/2023   Bursitis of left knee 11/01/2022   Dental abscess 09/19/2022   High risk medication use 06/22/2021   Systemic inflammatory response syndrome (HCC) 03/31/2021   Inflammatory arthritis 03/29/2021   CKD (chronic kidney disease), stage III (HCC) 03/29/2021   Microcytic anemia 03/29/2021   AKI (acute kidney injury) (HCC) 03/11/2021   Adverse reaction to non-steroidal anti-inflammatory drug (NSAID), initial encounter 03/11/2021   Vitamin D deficiency 11/17/2019   Chronic gout of  foot 10/11/2018   Current smoker 10/11/2018   Generalized pain 10/11/2018   Chronic nonintractable headache 08/06/2017   Essential hypertension 08/06/2017   Stroke (HCC) 07/17/2017   Past Medical History:  Diagnosis Date   Acid reflux 08/2019   Asthma    Depression    Gout    Hypertension    Stroke (HCC)    Family History  Problem Relation Age of Onset   Heart failure Mother    Hypertension Mother    Stroke Neg Hx    Past Surgical History:  Procedure Laterality Date   LOOP RECORDER INSERTION N/A 08/19/2017   Procedure: LOOP RECORDER INSERTION;  Surgeon: Hillis Range, MD;  Location: [facility];  Service: Cardiovascular;  Laterality: N/A;   TEE WITHOUT CARDIOVERSION N/A 08/19/2017   Procedure: TRANSESOPHAGEAL ECHOCARDIOGRAM (TEE);  Surgeon: Pricilla Riffle, MD;  Location: [facility] ENDOSCOPY;  Service: Cardiovascular;  Laterality: N/A;   Social History   Occupational History   Not on file  Tobacco Use   Smoking status: Every Day    Current packs/day: 0.25    Average packs/day: 0.3 packs/day for 7.0 years (1.8 ttl pk-yrs)    Types: Cigarettes   Smokeless tobacco: Never  Vaping Use   Vaping status: Never Used  Substance and Sexual Activity   Alcohol use: Yes    Comment: rare   Drug use: No   Sexual activity: Not Currently

## 2023-06-10 NOTE — Patient Instructions (Addendum)
Suzanne Graham,  - I have referred you to Rheumatology for them to work-up and possibly treat your underlying rheumatologic condition  - For your gout, I do not think the Allopurinol medication is working to prevent your gout attacks. We will do the following: - STOP taking allopurinol - continue taking your Colchicine 0.6 mg twice daily - START taking new medicine, Probenacid. You will take 1/2 tablet twice daily for the first week, then after this week you will take 1 full tablet twice a day consistently. I would like to see you back in about 4-5 weeks to recheck your uric acid levels. We can do that here in the office or have that checked at your PCP office.  If any questions, please message or call me/the office  - Dr. Shon Baton

## 2023-06-20 ENCOUNTER — Encounter: Payer: Self-pay | Admitting: Nurse Practitioner

## 2023-06-20 ENCOUNTER — Ambulatory Visit (INDEPENDENT_AMBULATORY_CARE_PROVIDER_SITE_OTHER): Payer: 59 | Admitting: Nurse Practitioner

## 2023-06-20 VITALS — BP 123/77 | HR 72 | Temp 97.2°F | Wt 173.0 lb

## 2023-06-20 DIAGNOSIS — I10 Essential (primary) hypertension: Secondary | ICD-10-CM

## 2023-06-20 NOTE — Patient Instructions (Addendum)
1. Essential hypertension (Primary)  - CBC - Comprehensive metabolic panel    Follow up:  Follow up in 3 months

## 2023-06-20 NOTE — Progress Notes (Signed)
Subjective   Patient ID: Suzanne Graham, female    DOB: Nov 26, 1980, 42 y.o.   MRN: [id number]  Chief Complaint  Patient presents with   Hypertension    Referring provider: Ivonne Andrew, NP  Suzanne Graham is a 42 y.o. female with Past Medical History: 08/2019: Acid reflux No date: Asthma No date: Depression No date: Gout No date: Hypertension No date: Stroke [facility])  HPI  Hypertension: Patient here for follow-up. She is not exercising and is adherent to low salt diet.  Does not check B/P regularly at home. Cardiac symptoms none. Patient denies chest pain, claudication, dyspnea, and fatigue.  Cardiovascular risk factors: obesity (BMI >= 30 kg/m2) and sedentary lifestyle. Use of agents associated with hypertension: none. History of target organ damage: none.    Allergies  Allergen Reactions   Nsaids Other (See Comments)    Likely causing AKI     There is no immunization history on file for this patient.  Tobacco History: Social History   Tobacco Use  Smoking Status Every Day   Current packs/day: 0.25   Average packs/day: 0.3 packs/day for 7.0 years (1.8 ttl pk-yrs)   Types: Cigarettes  Smokeless Tobacco Never   Ready to quit: No Counseling given: Yes   Outpatient Encounter Medications as of 06/20/2023  Medication Sig   acetaminophen (TYLENOL) 500 MG tablet Take 500 mg by mouth every 6 (six) hours as needed for moderate pain or headache.   albuterol (VENTOLIN HFA) 108 (90 Base) MCG/ACT inhaler Inhale 2 puffs into the lungs every 6 (six) hours as needed for wheezing or shortness of breath.   amLODipine (NORVASC) 10 MG tablet TAKE 1 TABLET BY MOUTH DAILY   colchicine 0.6 MG tablet TAKE 1 TABLET(0.6 MG) BY MOUTH TWICE DAILY AS NEEDED   probenecid (BENEMID) 500 MG tablet Take 1 tablet (500 mg total) by mouth 2 (two) times daily. Take 1/2 tablet twice a day for the first week, then may increase to 1 tablet twice daily   meloxicam (MOBIC) 15 MG tablet Take 1 tablet  (15 mg total) by mouth daily. (Patient not taking: Reported on 06/20/2023)   methocarbamol (ROBAXIN) 500 MG tablet Take 2 tablets (1,000 mg total) by mouth every 8 (eight) hours as needed for muscle spasms. (Patient not taking: Reported on 06/20/2023)   methylPREDNISolone (MEDROL DOSEPAK) 4 MG TBPK tablet Take per packet instructions. Taper dosing. (Patient not taking: Reported on 06/20/2023)   No facility-administered encounter medications on file as of 06/20/2023.    Review of Systems  Review of Systems  Constitutional: Negative.   HENT: Negative.    Cardiovascular: Negative.   Gastrointestinal: Negative.   Allergic/Immunologic: Negative.   Neurological: Negative.   Psychiatric/Behavioral: Negative.       Objective:   BP 123/77   Pulse 72   Temp (!) 97.2 F (36.2 C)   Wt 173 lb (78.5 kg)   SpO2 100%   BMI 29.70 kg/m   Wt Readings from Last 5 Encounters:  06/20/23 173 lb (78.5 kg)  04/23/23 180 lb (81.6 kg)  03/20/23 166 lb (75.3 kg)  03/13/23 171 lb (77.6 kg)  02/07/23 171 lb (77.6 kg)     Physical Exam Vitals and nursing note reviewed.  Constitutional:      General: She is not in acute distress.    Appearance: She is well-developed.  Cardiovascular:     Rate and Rhythm: Normal rate and regular rhythm.  Pulmonary:     Effort: Pulmonary effort is normal.  Breath sounds: Normal breath sounds.  Neurological:     Mental Status: She is alert and oriented to person, place, and time.       Assessment & Plan:   Essential hypertension     Return in about 3 months (around 09/18/2023).     Ivonne Andrew, NP 06/20/2023

## 2023-07-11 ENCOUNTER — Ambulatory Visit: Payer: 59 | Admitting: Sports Medicine

## 2023-07-29 ENCOUNTER — Encounter: Payer: Self-pay | Admitting: Sports Medicine

## 2023-07-29 ENCOUNTER — Ambulatory Visit (INDEPENDENT_AMBULATORY_CARE_PROVIDER_SITE_OTHER): Payer: 59 | Admitting: Sports Medicine

## 2023-07-29 DIAGNOSIS — M2550 Pain in unspecified joint: Secondary | ICD-10-CM | POA: Diagnosis not present

## 2023-07-29 DIAGNOSIS — M1712 Unilateral primary osteoarthritis, left knee: Secondary | ICD-10-CM | POA: Diagnosis not present

## 2023-07-29 DIAGNOSIS — M1A09X0 Idiopathic chronic gout, multiple sites, without tophus (tophi): Secondary | ICD-10-CM

## 2023-07-29 DIAGNOSIS — E790 Hyperuricemia without signs of inflammatory arthritis and tophaceous disease: Secondary | ICD-10-CM

## 2023-07-29 NOTE — Progress Notes (Signed)
Patient says that she has not had any gout flares since her last visit. She is taking her medicine consistently. She says that her knee is still painful, but not gout flare.

## 2023-07-29 NOTE — Progress Notes (Signed)
Suzanne Graham - 43 y.o. female MRN [id number]  Date of birth: 1980-12-21  Office Visit Note: Visit Date: 07/29/2023 PCP: Ivonne Andrew, NP Referred by: Ivonne Andrew, NP  Subjective: Chief Complaint  Patient presents with   Left Knee - Follow-up   HPI: Suzanne Graham is a pleasant 43 y.o. female who presents today for follow-up gout with hyperuricemia, left knee pain with OA.  Gout - since her last visit on 06/10/2023, she has fortunately not had any flareups of her gout exacerbation.  She has tolerated starting the probenecid and titrating up to 500 mg.  Uric acid review - 9.4 (05/23/23) --> 6.2 (10/23/21) --> 7.2 (07/25/21) --> 12.0 (06/22/21)   Left knee OA -she has known advanced arthritis of the left knee.  Feels like she has not had any exacerbation of fluid accumulation but still has pain in the knees which feels more like arthritic change.  Has had corticosteroid injections in the past with good relief but only temporary.  Uses Tylenol as needed.  Pertinent ROS were reviewed with the patient and found to be negative unless otherwise specified above in HPI.   Assessment & Plan: Visit Diagnoses:  1. Idiopathic chronic gout of multiple sites without tophus   2. Polyarthralgia   3. Unilateral primary osteoarthritis, left knee   4. Hyperuricemia    Plan: Impression is acute on chronic left knee pain with advanced osteoarthritis.  We have controlled her effusions which is not present today, but she does have some restriction with flexion and pain associated with this.  I would like to get her started in formalized physical therapy to try to preserve her range of motion and work on stabilization of the knee, referral sent today.  She may use her colchicine 0.6 mg daily as well for this and her gout.  In terms of her gout, she has not had a flare since starting probenecid.  She will continue probenecid 500 mg twice daily, we did draw a uric acid level today, discussed with her  shooting for a goal of uric acid less than 6.  Will titrate this as needed. Will call once UA results and discuss f/u and/or medication adjustments.  Follow-up: Return if symptoms worsen or fail to improve, for will call with uric acid level results.   Meds & Orders: No orders of the defined types were placed in this encounter.   Orders Placed This Encounter  Procedures   Uric acid   Ambulatory referral to Physical Therapy    Procedures: No procedures performed      Clinical History: No specialty comments available.  She reports that she has been smoking cigarettes. She has a 1.8 pack-year smoking history. She has never used smokeless tobacco.  Recent Labs    05/23/23 1319  LABURIC 9.4*    Objective:    Physical Exam  Gen: Well-appearing, in no acute distress; non-toxic CV: Well-perfused. Warm.  Resp: Breathing unlabored on room air; no wheezing. Psych: Fluid speech in conversation; appropriate affect; normal thought process  Ortho Exam - Left knee: There is no significant effusion on the knee, positive TTP over the medial joint line.  Range of motion slightly restricted from 0-120 degrees with pain at endrange flexion.  Imaging:  DG Knee Complete 4 Views Left CLINICAL DATA:  Left knee pain for 2 months  EXAM: LEFT KNEE - COMPLETE 4+ VIEW  COMPARISON:  09/27/2020  FINDINGS: Frontal, bilateral oblique, lateral views of the left knee are obtained. No acute  fracture, subluxation, or dislocation. There is moderate to severe 3 compartmental osteoarthritis, greatest in the medial and patellofemoral compartments. No significant change since prior exam. Small joint effusion, stable and likely reactive. Soft tissues are unremarkable.  IMPRESSION: 1. Stable moderate to severe 3 compartmental osteoarthritis. 2. Stable small joint effusion, likely reactive. 3. No acute fracture.  Electronically Signed   By: Sharlet Salina M.D.   On: 12/26/2022 15:57  Past  Medical/Family/Surgical/Social History: Medications & Allergies reviewed per EMR, new medications updated. Patient Active Problem List   Diagnosis Date Noted   Effusion of left knee 03/20/2023   Neck pain on right side 03/20/2023   Primary osteoarthritis of left knee 02/07/2023   Gout involving toe of right foot 02/07/2023   Bursitis of left knee 11/01/2022   Dental abscess 09/19/2022   High risk medication use 06/22/2021   Systemic inflammatory response syndrome (HCC) 03/31/2021   Inflammatory arthritis 03/29/2021   CKD (chronic kidney disease), stage III (HCC) 03/29/2021   Microcytic anemia 03/29/2021   AKI (acute kidney injury) (HCC) 03/11/2021   Adverse reaction to non-steroidal anti-inflammatory drug (NSAID), initial encounter 03/11/2021   Vitamin D deficiency 11/17/2019   Chronic gout of foot 10/11/2018   Current smoker 10/11/2018   Generalized pain 10/11/2018   Chronic nonintractable headache 08/06/2017   Essential hypertension 08/06/2017   Stroke (HCC) 07/17/2017   Past Medical History:  Diagnosis Date   Acid reflux 08/2019   Asthma    Depression    Gout    Hypertension    Stroke (HCC)    Family History  Problem Relation Age of Onset   Heart failure Mother    Hypertension Mother    Stroke Neg Hx    Past Surgical History:  Procedure Laterality Date   LOOP RECORDER INSERTION N/A 08/19/2017   Procedure: LOOP RECORDER INSERTION;  Surgeon: Hillis Range, MD;  Location: [facility];  Service: Cardiovascular;  Laterality: N/A;   TEE WITHOUT CARDIOVERSION N/A 08/19/2017   Procedure: TRANSESOPHAGEAL ECHOCARDIOGRAM (TEE);  Surgeon: Pricilla Riffle, MD;  Location: [facility] ENDOSCOPY;  Service: Cardiovascular;  Laterality: N/A;   Social History   Occupational History   Not on file  Tobacco Use   Smoking status: Every Day    Current packs/day: 0.25    Average packs/day: 0.3 packs/day for 7.0 years (1.8 ttl pk-yrs)    Types: Cigarettes   Smokeless tobacco: Never   Vaping Use   Vaping status: Never Used  Substance and Sexual Activity   Alcohol use: Yes    Comment: rare   Drug use: No   Sexual activity: Not Currently

## 2023-07-31 LAB — URIC ACID: Uric Acid, Serum: 7.7 mg/dL — ABNORMAL HIGH (ref 2.5–7.0)

## 2023-08-04 ENCOUNTER — Encounter: Payer: Self-pay | Admitting: Sports Medicine

## 2023-08-05 ENCOUNTER — Encounter: Payer: Self-pay | Admitting: Nurse Practitioner

## 2023-08-05 ENCOUNTER — Ambulatory Visit (INDEPENDENT_AMBULATORY_CARE_PROVIDER_SITE_OTHER): Payer: Self-pay | Admitting: Nurse Practitioner

## 2023-08-05 VITALS — BP 136/97 | HR 74 | Temp 97.0°F | Wt 167.0 lb

## 2023-08-05 DIAGNOSIS — M25551 Pain in right hip: Secondary | ICD-10-CM | POA: Diagnosis not present

## 2023-08-05 MED ORDER — KETOROLAC TROMETHAMINE 30 MG/ML IJ SOLN
30.0000 mg | Freq: Once | INTRAMUSCULAR | Status: AC
  Administered 2023-08-05: 30 mg via INTRAMUSCULAR

## 2023-08-05 MED ORDER — CYCLOBENZAPRINE HCL 10 MG PO TABS: 10.0000 mg | ORAL_TABLET | Freq: Three times a day (TID) | ORAL | 0 refills | Status: AC | PRN

## 2023-08-05 NOTE — Progress Notes (Signed)
Subjective   Patient ID: Suzanne Graham, female    DOB: [birth year], 43 y.o.   MRN: [id number]  Chief Complaint  Patient presents with   Hip Pain    Right hip pain after a fall     Referring provider: Ivonne Andrew, NP  Suzanne Graham is a 43 y.o. female with Past Medical History: 08/2019: Acid reflux No date: Asthma No date: Depression No date: Gout No date: Hypertension No date: Stroke [facility])   HPI  Patient presents today for an acute visit.  She states that she fell 2 days ago while mopping.  Patient has been having right hip pain and right low back pain since the fall.  She does have good range of motion to her right hip.  She denies any rash or swelling.  We will trial Toradol in office today.  We will order x-ray. Denies f/c/s, n/v/d, hemoptysis, PND, leg swelling Denies chest pain or edema      Allergies  Allergen Reactions   Nsaids Other (See Comments)    Likely causing AKI     There is no immunization history on file for this patient.  Tobacco History: Social History   Tobacco Use  Smoking Status Every Day   Current packs/day: 0.25   Average packs/day: 0.3 packs/day for 7.0 years (1.8 ttl pk-yrs)   Types: Cigarettes  Smokeless Tobacco Never   Ready to quit: No Counseling given: Yes   Outpatient Encounter Medications as of 08/05/2023  Medication Sig   acetaminophen (TYLENOL) 500 MG tablet Take 500 mg by mouth every 6 (six) hours as needed for moderate pain or headache.   albuterol (VENTOLIN HFA) 108 (90 Base) MCG/ACT inhaler Inhale 2 puffs into the lungs every 6 (six) hours as needed for wheezing or shortness of breath.   amLODipine (NORVASC) 10 MG tablet TAKE 1 TABLET BY MOUTH DAILY   colchicine 0.6 MG tablet TAKE 1 TABLET(0.6 MG) BY MOUTH TWICE DAILY AS NEEDED   cyclobenzaprine (FLEXERIL) 10 MG tablet Take 1 tablet (10 mg total) by mouth 3 (three) times daily as needed for muscle spasms.   probenecid (BENEMID) 500 MG tablet Take 1 tablet (500 mg  total) by mouth 2 (two) times daily. Take 1/2 tablet twice a day for the first week, then may increase to 1 tablet twice daily   meloxicam (MOBIC) 15 MG tablet Take 1 tablet (15 mg total) by mouth daily. (Patient not taking: Reported on 08/05/2023)   methocarbamol (ROBAXIN) 500 MG tablet Take 2 tablets (1,000 mg total) by mouth every 8 (eight) hours as needed for muscle spasms. (Patient not taking: Reported on 08/05/2023)   methylPREDNISolone (MEDROL DOSEPAK) 4 MG TBPK tablet Take per packet instructions. Taper dosing. (Patient not taking: Reported on 08/05/2023)   Facility-Administered Encounter Medications as of 08/05/2023  Medication   ketorolac (TORADOL) 30 MG/ML injection 30 mg    Review of Systems  Review of Systems  Constitutional: Negative.   HENT: Negative.    Cardiovascular: Negative.   Gastrointestinal: Negative.   Allergic/Immunologic: Negative.   Neurological: Negative.   Psychiatric/Behavioral: Negative.       Objective:   BP (!) 136/97   Pulse 74   Temp (!) 97 F (36.1 C)   Wt 167 lb (75.8 kg)   SpO2 100%   BMI 28.67 kg/m   Wt Readings from Last 5 Encounters:  08/05/23 167 lb (75.8 kg)  06/20/23 173 lb (78.5 kg)  04/23/23 180 lb (81.6 kg)  03/20/23 166 lb (75.3  kg)  03/13/23 171 lb (77.6 kg)     Physical Exam Vitals and nursing note reviewed.  Constitutional:      General: She is not in acute distress.    Appearance: She is well-developed.  Cardiovascular:     Rate and Rhythm: Normal rate and regular rhythm.  Pulmonary:     Effort: Pulmonary effort is normal.     Breath sounds: Normal breath sounds.  Neurological:     Mental Status: She is alert and oriented to person, place, and time.       Assessment & Plan:   Right hip pain -     DG HIP UNILAT W OR W/O PELVIS 2-3 VIEWS RIGHT -     Ketorolac Tromethamine -     Cyclobenzaprine HCl; Take 1 tablet (10 mg total) by mouth 3 (three) times daily as needed for muscle spasms.  Dispense: 30 tablet;  Refill: 0     Return if symptoms worsen or fail to improve.   Ivonne Andrew, NP 08/05/2023

## 2023-08-05 NOTE — Patient Instructions (Signed)
1. Right hip pain (Primary)  - DG HIP UNILAT W OR W/O PELVIS 2-3 VIEWS RIGHT - ketorolac (TORADOL) 30 MG/ML injection 30 mg - cyclobenzaprine (FLEXERIL) 10 MG tablet; Take 1 tablet (10 mg total) by mouth 3 (three) times daily as needed for muscle spasms.  Dispense: 30 tablet; Refill: 0  Follow up:  Follow up as scheduled

## 2023-08-06 ENCOUNTER — Ambulatory Visit (HOSPITAL_COMMUNITY)
Admission: RE | Admit: 2023-08-06 | Discharge: 2023-08-06 | Disposition: A | Discharge status: Home / Self Care | Payer: 59 | Source: Ambulatory Visit | Attending: Nurse Practitioner | Admitting: Nurse Practitioner

## 2023-08-06 DIAGNOSIS — M1611 Unilateral primary osteoarthritis, right hip: Secondary | ICD-10-CM | POA: Diagnosis not present

## 2023-08-06 DIAGNOSIS — M25551 Pain in right hip: Secondary | ICD-10-CM | POA: Insufficient documentation

## 2023-08-06 DIAGNOSIS — M47816 Spondylosis without myelopathy or radiculopathy, lumbar region: Secondary | ICD-10-CM | POA: Diagnosis not present

## 2023-08-12 ENCOUNTER — Other Ambulatory Visit: Payer: Self-pay | Admitting: Nurse Practitioner

## 2023-08-12 DIAGNOSIS — M1A0790 Idiopathic chronic gout, unspecified ankle and foot, without tophus (tophi): Secondary | ICD-10-CM

## 2023-08-12 NOTE — Telephone Encounter (Signed)
 Please advise Kh

## 2023-08-12 NOTE — Telephone Encounter (Signed)
Last Fill: 02/17/23  Last OV: 08/05/23 Next OV: 09/19/23  Routing to provider for review/authorization.

## 2023-08-12 NOTE — Telephone Encounter (Signed)
 Copied from CRM [id number]. Topic: Clinical - Medication Refill >> Aug 12, 2023  1:55 PM Farrel B wrote: Most Recent Primary Care Visit:  Provider: OLEY BASCOM RAMAN  Department: [facility]  Visit Type: OFFICE VISIT  Date: 08/05/2023  Medication: colchicine  0.6 MG tablet  Has the patient contacted their pharmacy? Yes (Agent: If no, request that the patient contact the pharmacy for the refill. If patient does not wish to contact the pharmacy document the reason why and proceed with request.) (Agent: If yes, when and what did the pharmacy advise?)Stated they had not heard anything from provider on requesting a refill   Is this the correct pharmacy for this prescription? Yes If no, delete pharmacy and type the correct one.  This is the patient's preferred pharmacy:  [id number] - [place], [place] - [address] [place] [address] Phone: [phone] Fax: [phone]   Has the prescription been filled recently? 08/24 its a 3 month prescription the patient stated   Is the patient out of the medication? Yes  Has the patient been seen for an appointment in the last year OR does the patient have an upcoming appointment? Yes  Can we respond through MyChart? No  Agent: Please be advised that Rx refills may take up to 3 business days. We ask that you follow-up with your pharmacy.

## 2023-08-13 MED ORDER — COLCHICINE 0.6 MG PO TABS: 0.6000 mg | ORAL_TABLET | Freq: Two times a day (BID) | ORAL | 2 refills | Status: DC

## 2023-08-18 ENCOUNTER — Other Ambulatory Visit: Payer: Self-pay | Admitting: Nurse Practitioner

## 2023-08-18 DIAGNOSIS — M25551 Pain in right hip: Secondary | ICD-10-CM

## 2023-08-19 ENCOUNTER — Encounter: Payer: Self-pay | Admitting: Physician Assistant

## 2023-08-19 ENCOUNTER — Ambulatory Visit (INDEPENDENT_AMBULATORY_CARE_PROVIDER_SITE_OTHER): Payer: 59 | Admitting: Physician Assistant

## 2023-08-19 DIAGNOSIS — M10021 Idiopathic gout, right elbow: Secondary | ICD-10-CM | POA: Diagnosis not present

## 2023-08-19 NOTE — Progress Notes (Signed)
Office Visit Note   Patient: Suzanne Graham           Date of Birth: [birth year]           MRN: [id number] Visit Date: 08/19/2023              Requested by: Ivonne Andrew, NP [phone] [address],  [place] [address] PCP: Ivonne Andrew, NP  Chief Complaint  Patient presents with   Right Elbow - Pain      HPI: Patient is a 43 year old woman who is a patient of Dr. Shon Baton.  She has a long history of gout.  She has multiple sites.  She has had difficulty controlling the gout but has done well with less flaring with Benemid and colchicine.  Unfortunately she ran out of colchicine and had difficulty contacting her doctor so was without it for 3 days.  She subsequently developed swelling and gouty symptoms in both her right elbow and wrist.  She is right-handed.  She did recently able to get in touch with her primary care and did get more colchicine  Assessment & Plan: Visit Diagnoses: Gout right elbow and wrist.  Plan: I see no sign of infection she is neurovascularly intact she just very tender in her right elbow and right hand.  She was off the colchicine for 3 to 4 days.  She does not have any fever chills or red flags.  Will give her a sling to help elevate her arm should treat this symptomatically will give her off from work until Monday hopefully the symptoms will resolve can follow-up with Dr. Shon Baton  Follow-Up Instructions: If no improvement  Ortho Exam  Patient is alert, oriented, no adenopathy, well-dressed, normal affect, normal respiratory effort. Examination of her right arm her pulses palpable she has diffuse swelling from the elbow down into her hand quite tender.  No history of trauma no redness or erythema no cellulitis  Imaging: No results found. No images are attached to the encounter.  Labs: Lab Results  Component Value Date   HGBA1C 5.0 11/15/2019   HGBA1C 5.0 11/15/2019   HGBA1C 5.0 (A) 11/15/2019   HGBA1C 5.0 11/15/2019   ESRSEDRATE 45 (H)  05/23/2023   ESRSEDRATE 17 10/23/2021   ESRSEDRATE 120 (H) 03/31/2021   CRP 18.1 (H) 03/31/2021   CRP 33.2 (H) 03/28/2021   CRP 14.0 (H) 03/12/2021   LABURIC 7.7 (H) 07/29/2023   LABURIC 9.4 (H) 05/23/2023   LABURIC 6.2 10/23/2021   REPTSTATUS 04/01/2021 FINAL 03/30/2021   GRAMSTAIN  03/29/2021    NO SQUAMOUS EPITHELIAL CELLS SEEN NO WBC SEEN NO ORGANISMS SEEN    CULT MULTIPLE SPECIES PRESENT, SUGGEST RECOLLECTION (A) 03/30/2021   LABORGA ESCHERICHIA COLI (A) 07/17/2017     Lab Results  Component Value Date   ALBUMIN 4.2 12/20/2022   ALBUMIN 4.4 12/12/2021   ALBUMIN 3.6 (L) 04/24/2021    Lab Results  Component Value Date   MG 1.9 11/15/2019   Lab Results  Component Value Date   VD25OH 6.5 (L) 11/15/2019    No results found for: "PREALBUMIN"    Latest Ref Rng & Units 12/20/2022   11:46 AM 09/12/2022    1:20 PM 01/03/2022    3:36 PM  CBC EXTENDED  WBC 3.4 - 10.8 x10E3/uL 4.9  6.9  8.3   RBC 3.77 - 5.28 x10E6/uL 4.30  4.22  4.13   Hemoglobin 11.1 - 15.9 g/dL 34.7  42.5  95.6   HCT  34.0 - 46.6 % 35.0  33.0  33.4   Platelets 150 - 450 x10E3/uL 263  218  288   NEUT# 1.7 - 7.7 K/uL  5.4  6.0   Lymph# 0.7 - 4.0 K/uL  0.9  1.3      There is no height or weight on file to calculate BMI.  Orders:  No orders of the defined types were placed in this encounter.  No orders of the defined types were placed in this encounter.    Procedures: No procedures performed  Clinical Data: No additional findings.  ROS:  All other systems negative, except as noted in the HPI. Review of Systems  Objective: Vital Signs: LMP 08/03/2023   Specialty Comments:  No specialty comments available.  PMFS History: Patient Active Problem List   Diagnosis Date Noted   Effusion of left knee 03/20/2023   Neck pain on right side 03/20/2023   Primary osteoarthritis of left knee 02/07/2023   Gout involving toe of right foot 02/07/2023   Bursitis of left knee 11/01/2022   Dental  abscess 09/19/2022   High risk medication use 06/22/2021   Systemic inflammatory response syndrome (HCC) 03/31/2021   Inflammatory arthritis 03/29/2021   CKD (chronic kidney disease), stage III (HCC) 03/29/2021   Microcytic anemia 03/29/2021   AKI (acute kidney injury) (HCC) 03/11/2021   Adverse reaction to non-steroidal anti-inflammatory drug (NSAID), initial encounter 03/11/2021   Vitamin D deficiency 11/17/2019   Chronic gout of foot 10/11/2018   Current smoker 10/11/2018   Generalized pain 10/11/2018   Chronic nonintractable headache 08/06/2017   Essential hypertension 08/06/2017   Stroke (HCC) 07/17/2017   Past Medical History:  Diagnosis Date   Acid reflux 08/2019   Asthma    Depression    Gout    Hypertension    Stroke (HCC)     Family History  Problem Relation Age of Onset   Heart failure Mother    Hypertension Mother    Stroke Neg Hx     Past Surgical History:  Procedure Laterality Date   LOOP RECORDER INSERTION N/A 08/19/2017   Procedure: LOOP RECORDER INSERTION;  Surgeon: Hillis Range, MD;  Location: [facility] INVASIVE CV LAB;  Service: Cardiovascular;  Laterality: N/A;   TEE WITHOUT CARDIOVERSION N/A 08/19/2017   Procedure: TRANSESOPHAGEAL ECHOCARDIOGRAM (TEE);  Surgeon: Pricilla Riffle, MD;  Location: [facility] ENDOSCOPY;  Service: Cardiovascular;  Laterality: N/A;   Social History   Occupational History   Not on file  Tobacco Use   Smoking status: Every Day    Current packs/day: 0.25    Average packs/day: 0.3 packs/day for 7.0 years (1.8 ttl pk-yrs)    Types: Cigarettes   Smokeless tobacco: Never  Vaping Use   Vaping status: Never Used  Substance and Sexual Activity   Alcohol use: Yes    Comment: rare   Drug use: No   Sexual activity: Not Currently

## 2023-08-20 ENCOUNTER — Telehealth: Payer: Self-pay | Admitting: Nurse Practitioner

## 2023-08-20 NOTE — Telephone Encounter (Signed)
Copied from CRM [id number]. Topic: General - Other >> Aug 19, 2023  5:59 PM Eunice Blase wrote: Reason for CRM: Pt called stated someone call her at about 2:00 p.m. Please call pt. [phone]

## 2023-09-10 ENCOUNTER — Emergency Department (HOSPITAL_COMMUNITY)

## 2023-09-10 ENCOUNTER — Emergency Department (HOSPITAL_COMMUNITY)
Admission: EM | Admit: 2023-09-10 | Discharge: 2023-09-10 | Disposition: A | Discharge status: Home / Self Care | Attending: Emergency Medicine | Admitting: Emergency Medicine

## 2023-09-10 ENCOUNTER — Other Ambulatory Visit: Payer: Self-pay

## 2023-09-10 DIAGNOSIS — J45909 Unspecified asthma, uncomplicated: Secondary | ICD-10-CM | POA: Diagnosis not present

## 2023-09-10 DIAGNOSIS — Z79899 Other long term (current) drug therapy: Secondary | ICD-10-CM | POA: Diagnosis not present

## 2023-09-10 DIAGNOSIS — R0602 Shortness of breath: Secondary | ICD-10-CM | POA: Insufficient documentation

## 2023-09-10 DIAGNOSIS — I10 Essential (primary) hypertension: Secondary | ICD-10-CM | POA: Diagnosis not present

## 2023-09-10 DIAGNOSIS — Z8673 Personal history of transient ischemic attack (TIA), and cerebral infarction without residual deficits: Secondary | ICD-10-CM | POA: Insufficient documentation

## 2023-09-10 DIAGNOSIS — F419 Anxiety disorder, unspecified: Secondary | ICD-10-CM | POA: Diagnosis not present

## 2023-09-10 LAB — BASIC METABOLIC PANEL
Anion gap: 11 (ref 5–15)
BUN: 15 mg/dL (ref 6–20)
CO2: 22 mmol/L (ref 22–32)
Calcium: 9.1 mg/dL (ref 8.9–10.3)
Chloride: 103 mmol/L (ref 98–111)
Creatinine, Ser: 1.01 mg/dL — ABNORMAL HIGH (ref 0.44–1.00)
GFR, Estimated: >60 mL/min (ref >60)
Glucose, Bld: 83 mg/dL (ref 70–99)
Potassium: 3.8 mmol/L (ref 3.5–5.1)
Sodium: 136 mmol/L (ref 135–145)

## 2023-09-10 LAB — CBC
HCT: 31.9 % — ABNORMAL LOW (ref 36.0–46.0)
Hemoglobin: 10.3 g/dL — ABNORMAL LOW (ref 12.0–15.0)
MCH: 25.5 pg — ABNORMAL LOW (ref 26.0–34.0)
MCHC: 32.3 g/dL (ref 30.0–36.0)
MCV: 79 fL — ABNORMAL LOW (ref 80.0–100.0)
Platelets: 255 10*3/uL (ref 150–400)
RBC: 4.04 MIL/uL (ref 3.87–5.11)
RDW: 14.6 % (ref 11.5–15.5)
WBC: 5.8 10*3/uL (ref 4.0–10.5)
nRBC: 0 % (ref 0.0–0.2)

## 2023-09-10 LAB — HCG, SERUM, QUALITATIVE: Preg, Serum: NEGATIVE

## 2023-09-10 MED ORDER — HYDROXYZINE HCL 25 MG PO TABS: 25.0000 mg | ORAL_TABLET | Freq: Four times a day (QID) | ORAL | 0 refills | Status: DC

## 2023-09-10 MED ORDER — HYDROXYZINE HCL 25 MG PO TABS
25.0000 mg | ORAL_TABLET | Freq: Once | ORAL | Status: AC
  Administered 2023-09-10: 25 mg via ORAL
  Filled 2023-09-10: qty 1

## 2023-09-10 NOTE — ED Provider Notes (Signed)
 [place] EMERGENCY DEPARTMENT AT [facility] Provider Note   CSN: [id number] Arrival date & time: 09/10/23  1012     History  Chief Complaint  Patient presents with   Shortness of Breath    Suzanne Graham is a 43 y.o. female.  43 year old female presents today for concern of sudden onset of shortness of breath while she was at work.  She states she was with her friend talking about her supervisor due to some concern.  Given during my interview of the patient she does seem upset by what ever took place.  She states she does have history of anxiety with history of panic attacks.  Does not currently take anything for anxiety.  She states since arriving to the emergency department she has been completely symptom-free.  No chest pain, shortness of breath.  No prior history of DVT or PE.  The history is provided by the patient. No language interpreter was used.       Home Medications Prior to Admission medications   Medication Sig Start Date End Date Taking? Authorizing Provider  acetaminophen (TYLENOL) 500 MG tablet Take 500 mg by mouth every 6 (six) hours as needed for moderate pain or headache.    [provider]  albuterol (VENTOLIN HFA) 108 (90 Base) MCG/ACT inhaler Inhale 2 puffs into the lungs every 6 (six) hours as needed for wheezing or shortness of breath. 10/14/22   Ivonne Andrew, NP  amLODipine (NORVASC) 10 MG tablet TAKE 1 TABLET BY MOUTH DAILY 05/05/23 05/04/24  Ivonne Andrew, NP  colchicine 0.6 MG tablet Take 1 tablet (0.6 mg total) by mouth 2 (two) times daily. 08/13/23   Ivonne Andrew, NP  cyclobenzaprine (FLEXERIL) 10 MG tablet Take 1 tablet (10 mg total) by mouth 3 (three) times daily as needed for muscle spasms. 08/05/23   Ivonne Andrew, NP  meloxicam (MOBIC) 15 MG tablet Take 1 tablet (15 mg total) by mouth daily. Patient not taking: Reported on 08/05/2023 01/28/23   Madelyn Brunner, DO  methocarbamol (ROBAXIN) 500 MG tablet Take 2 tablets (1,000  mg total) by mouth every 8 (eight) hours as needed for muscle spasms. Patient not taking: Reported on 08/05/2023 03/13/23   Renne Crigler, PA-C  methylPREDNISolone (MEDROL DOSEPAK) 4 MG TBPK tablet Take per packet instructions. Taper dosing. Patient not taking: Reported on 08/05/2023 05/26/23   Madelyn Brunner, DO  probenecid (BENEMID) 500 MG tablet Take 1 tablet (500 mg total) by mouth 2 (two) times daily. Take 1/2 tablet twice a day for the first week, then may increase to 1 tablet twice daily 06/10/23 08/09/23  Madelyn Brunner, DO      Allergies    Nsaids    Review of Systems   Review of Systems  Constitutional:  Negative for chills and fever.  Respiratory:  Negative for shortness of breath.   Cardiovascular:  Negative for chest pain and palpitations.  Neurological:  Negative for light-headedness.  All other systems reviewed and are negative.   Physical Exam Updated Vital Signs BP 130/89 (BP Location: Right Arm)   Pulse 81   Temp 98.5 F (36.9 C)   Resp 16   Ht 5\' 4"  (1.626 m)   Wt 77.6 kg   LMP 09/05/2023   SpO2 100%   BMI 29.35 kg/m  Physical Exam Vitals and nursing note reviewed.  Constitutional:      General: She is not in acute distress.    Appearance: Normal appearance. She is not ill-appearing.  HENT:     Head: Normocephalic and atraumatic.     Nose: Nose normal.  Eyes:     General: No scleral icterus.    Extraocular Movements: Extraocular movements intact.     Conjunctiva/sclera: Conjunctivae normal.  Cardiovascular:     Rate and Rhythm: Normal rate and regular rhythm.     Pulses: Normal pulses.     Heart sounds: Normal heart sounds.  Pulmonary:     Effort: Pulmonary effort is normal. No respiratory distress.     Breath sounds: Normal breath sounds. No wheezing or rales.  Abdominal:     General: There is no distension.     Tenderness: There is no abdominal tenderness.  Musculoskeletal:        General: Normal range of motion.     Cervical back: Normal range of  motion.  Skin:    General: Skin is warm and dry.  Neurological:     General: No focal deficit present.     Mental Status: She is alert. Mental status is at baseline.    ED Results / Procedures / Treatments   Labs (all labs ordered are listed, but only abnormal results are displayed) Labs Reviewed  BASIC METABOLIC PANEL - Abnormal; Notable for the following components:      Result Value   Creatinine, Ser 1.01 (*)    All other components within normal limits  CBC - Abnormal; Notable for the following components:   Hemoglobin 10.3 (*)    HCT 31.9 (*)    MCV 79.0 (*)    MCH 25.5 (*)    All other components within normal limits  HCG, SERUM, QUALITATIVE    EKG EKG Interpretation Date/Time:  Wednesday September 10 2023 10:19:17 EST Ventricular Rate:  77 PR Interval:  130 QRS Duration:  80 QT Interval:  388 QTC Calculation: 439 R Axis:   26  Text Interpretation: Normal sinus rhythm Normal ECG No significant change since last tracing Confirmed by Meridee Score [phone]) on 09/10/2023 10:55:56 AM  Radiology DG Chest 2 View Result Date: 09/10/2023 CLINICAL DATA:  Shortness of breath. EXAM: CHEST - 2 VIEW COMPARISON:  January 09, 2022. FINDINGS: The heart size and mediastinal contours are within normal limits. Both lungs are clear. The visualized skeletal structures are unremarkable. IMPRESSION: No active cardiopulmonary disease. Electronically Signed   By: Lupita Raider M.D.   On: 09/10/2023 13:30    Procedures Procedures    Medications Ordered in ED Medications  hydrOXYzine (ATARAX) tablet 25 mg (25 mg Oral Given 09/10/23 1133)    ED Course/ Medical Decision Making/ A&P                                 Medical Decision Making Amount and/or Complexity of Data Reviewed Labs: ordered. Radiology: ordered.  Risk Prescription drug management.   Medical Decision Making / ED Course   This patient presents to the ED for concern of shortness of breath, anxiety, this involves an  extensive number of treatment options, and is a complaint that carries with it a high risk of complications and morbidity.  The differential diagnosis includes panic attack, anxiety, ACS, PE,  MDM: 43 year old female presents today for concern of sudden onset of shortness of breath while she was at work.  She does have history of asthma.  She states she was discussing something with her friend regarding their supervisor.  She is visibly upset even now regarding what  took place.  However she states she is symptom-free since arrival to the emergency department.  Not on anything for her history of anxiety.  Will give dose of Atarax.  Will check blood work, chest x-ray, and EKG.  CBC with hemoglobin of 10.3 she is not far from her baseline.  No leukocytosis.  BMP without acute concern.  Pregnancy test negative.  Chest x-ray without acute concern.  EKG without acute ischemic change.  Atarax prescription given.  She is appropriate for discharge.  Discharged in stable condition.  Return precaution discussed.  Lab Tests: -I ordered, reviewed, and interpreted labs.   The pertinent results include:   Labs Reviewed  BASIC METABOLIC PANEL - Abnormal; Notable for the following components:      Result Value   Creatinine, Ser 1.01 (*)    All other components within normal limits  CBC - Abnormal; Notable for the following components:   Hemoglobin 10.3 (*)    HCT 31.9 (*)    MCV 79.0 (*)    MCH 25.5 (*)    All other components within normal limits  HCG, SERUM, QUALITATIVE      EKG  EKG Interpretation Date/Time:  Wednesday September 10 2023 10:19:17 EST Ventricular Rate:  77 PR Interval:  130 QRS Duration:  80 QT Interval:  388 QTC Calculation: 439 R Axis:   26  Text Interpretation: Normal sinus rhythm Normal ECG No significant change since last tracing Confirmed by Meridee Score [phone]) on 09/10/2023 10:55:56 AM         Imaging Studies ordered: I ordered imaging studies including chest  x-ray I independently visualized and interpreted imaging. I agree with the radiologist interpretation   Medicines ordered and prescription drug management: Meds ordered this encounter  Medications   hydrOXYzine (ATARAX) tablet 25 mg    -I have reviewed the patients home medicines and have made adjustments as needed   Social Determinants of Health:  Factors impacting patients care include: Good follow-up and social support   Reevaluation: After the interventions noted above, I reevaluated the patient and found that they have :resolved  Co morbidities that complicate the patient evaluation  Past Medical History:  Diagnosis Date   Acid reflux 08/2019   Asthma    Depression    Gout    Hypertension    Stroke [facility])       Dispostion: Discharged in stable condition.  Return precaution discussed.  Patient voices understanding and is in agreement with plan.   Final Clinical Impression(s) / ED Diagnoses Final diagnoses:  Anxiety  Shortness of breath    Rx / DC Orders ED Discharge Orders          Ordered    hydrOXYzine (ATARAX) 25 MG tablet  Every 6 hours        09/10/23 1402              Marita [place], PA-C 09/10/23 1413    Terrilee Files, MD 09/10/23 [phone]

## 2023-09-10 NOTE — ED Triage Notes (Signed)
 Pt. Stated, I was at work and I felt like I couldn't catch my breath so I came here.

## 2023-09-10 NOTE — ED Provider Triage Note (Signed)
 Emergency Medicine Provider Triage Evaluation Note  Suzanne Graham , a 43 y.o. female  was evaluated in triage.  Pt complains of shortness of breath.  Had a few episodes of shortness of breath this morning while at work.  Improved after she used her inhaler.  She does have a history of asthma.  No associated chest pain.  No leg swelling.  No fevers or cough..  Review of Systems  Positive: Wheezing, shortness of breath Negative: Chest pain, leg swelling  Physical Exam  BP 130/89 (BP Location: Right Arm)   Pulse 81   Temp 98.5 F (36.9 C)   Resp 16   Ht 5\' 4"  (1.626 m)   Wt 77.6 kg   LMP 09/05/2023   SpO2 100%   BMI 29.35 kg/m  Gen:   Awake, no distress    Resp:  Normal effort lungs clear on exam MSK:   Moves extremities without difficulty   Other:     Medical Decision Making  Medically screening exam initiated at 10:39 AM.  Appropriate orders placed.  Maurice Ramseur was informed that the remainder of the evaluation will be completed by another provider, this initial triage assessment does not replace that evaluation, and the importance of remaining in the ED until their evaluation is complete.      Rolan Bucco, MD 09/10/23 [phone]

## 2023-09-10 NOTE — Discharge Instructions (Addendum)
 Follow-up with your primary care doctor.  I have prescribed Atarax.  Take this as needed.  Return for any concerning symptoms.  Your x-ray, blood work, EKG was reassuring.
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
## 2023-09-16 ENCOUNTER — Ambulatory Visit: Attending: Sports Medicine | Admitting: Physical Therapy

## 2023-09-16 ENCOUNTER — Other Ambulatory Visit: Payer: Self-pay

## 2023-09-16 ENCOUNTER — Encounter: Payer: Self-pay | Admitting: Physical Therapy

## 2023-09-16 DIAGNOSIS — M6281 Muscle weakness (generalized): Secondary | ICD-10-CM | POA: Insufficient documentation

## 2023-09-16 DIAGNOSIS — M25562 Pain in left knee: Secondary | ICD-10-CM | POA: Diagnosis present

## 2023-09-16 DIAGNOSIS — G8929 Other chronic pain: Secondary | ICD-10-CM | POA: Diagnosis present

## 2023-09-16 NOTE — Patient Instructions (Signed)
 Access Code: [email or web address] URL: [email or web address] Date: 09/16/2023 Prepared by: Rosana Hoes  Exercises - Long Sitting Superior Patellar Glide  - 1 x daily - Supine Quad Set  - 1 x daily - 10 reps - 5 seconds hold - Active Straight Leg Raise with Quad Set  - 1 x daily - 2 sets - 10 reps - Clam with Resistance  - 1 x daily - 2 sets - 10 reps - Standing Gastroc Stretch at Counter  - 1 x daily - 3 reps - 30 seconds hold

## 2023-09-19 ENCOUNTER — Ambulatory Visit: Payer: Self-pay | Admitting: Nurse Practitioner

## 2023-09-24 ENCOUNTER — Ambulatory Visit

## 2023-09-27 ENCOUNTER — Telehealth: Payer: Self-pay

## 2023-09-27 ENCOUNTER — Ambulatory Visit

## 2023-09-27 NOTE — Telephone Encounter (Signed)
 LVM regarding missed appointment. Confirmed next appointment time and reminded of clinic attendance policy.  1st no-show  Berta Minor, [place] 09/27/23 11:41 AM
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
## 2023-09-30 ENCOUNTER — Telehealth: Payer: Self-pay

## 2023-09-30 ENCOUNTER — Ambulatory Visit

## 2023-09-30 NOTE — Telephone Encounter (Signed)
 TC due to missed visit.  VM left regarding next appointment and attendance policy.

## 2023-10-02 ENCOUNTER — Ambulatory Visit

## 2023-10-02 DIAGNOSIS — M6281 Muscle weakness (generalized): Secondary | ICD-10-CM

## 2023-10-02 DIAGNOSIS — G8929 Other chronic pain: Secondary | ICD-10-CM

## 2023-10-02 DIAGNOSIS — M25562 Pain in left knee: Secondary | ICD-10-CM | POA: Diagnosis not present
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
## 2023-10-07 ENCOUNTER — Encounter

## 2023-10-09 ENCOUNTER — Encounter

## 2023-10-14 ENCOUNTER — Encounter

## 2023-10-16 ENCOUNTER — Encounter

## 2023-10-20 ENCOUNTER — Other Ambulatory Visit: Payer: Self-pay | Admitting: Sports Medicine

## 2023-10-20 DIAGNOSIS — M1A09X0 Idiopathic chronic gout, multiple sites, without tophus (tophi): Secondary | ICD-10-CM

## 2023-10-21 ENCOUNTER — Ambulatory Visit

## 2023-11-03 ENCOUNTER — Encounter: Payer: Self-pay | Admitting: Nurse Practitioner

## 2023-11-03 ENCOUNTER — Ambulatory Visit (INDEPENDENT_AMBULATORY_CARE_PROVIDER_SITE_OTHER): Payer: Self-pay | Admitting: Nurse Practitioner

## 2023-11-03 VITALS — BP 120/73 | HR 79 | Temp 98.2°F | Wt 170.8 lb

## 2023-11-03 DIAGNOSIS — I10 Essential (primary) hypertension: Secondary | ICD-10-CM

## 2023-11-03 DIAGNOSIS — Z13220 Encounter for screening for lipoid disorders: Secondary | ICD-10-CM | POA: Diagnosis not present

## 2023-11-03 DIAGNOSIS — Z1329 Encounter for screening for other suspected endocrine disorder: Secondary | ICD-10-CM | POA: Diagnosis not present

## 2023-11-03 NOTE — Progress Notes (Signed)
 Subjective   Patient ID: Suzanne Graham, female    DOB: February 12, 1981, 43 y.o.   MRN: [id number]  Chief Complaint  Patient presents with   Follow-up    Referring provider: Jerrlyn Morel, NP  Suzanne Graham is a 43 y.o. female with Past Medical History: 08/2019: Acid reflux No date: Asthma No date: Depression No date: Gout No date: Hypertension No date: Stroke [facility])   HPI  Hypertension: Patient here for follow-up. She is not exercising and is adherent to low salt diet.  Does not check B/P regularly at home. Cardiac symptoms none. Patient denies chest pain, claudication, dyspnea, and fatigue.  Cardiovascular risk factors: obesity (BMI >= 30 kg/m2) and sedentary lifestyle. Use of agents associated with hypertension: none. History of target organ damage: none.   Allergies  Allergen Reactions   Nsaids Other (See Comments)    Likely causing AKI     There is no immunization history on file for this patient.  Tobacco History: Social History   Tobacco Use  Smoking Status Every Day   Current packs/day: 0.25   Average packs/day: 0.3 packs/day for 7.0 years (1.8 ttl pk-yrs)   Types: Cigarettes  Smokeless Tobacco Never   Ready to quit: Yes Counseling given: Yes   Outpatient Encounter Medications as of 11/03/2023  Medication Sig   acetaminophen  (TYLENOL ) 500 MG tablet Take 500 mg by mouth every 6 (six) hours as needed for moderate pain or headache.   albuterol  (VENTOLIN  HFA) 108 (90 Base) MCG/ACT inhaler Inhale 2 puffs into the lungs every 6 (six) hours as needed for wheezing or shortness of breath.   amLODipine  (NORVASC ) 10 MG tablet TAKE 1 TABLET BY MOUTH DAILY   colchicine  0.6 MG tablet Take 1 tablet (0.6 mg total) by mouth 2 (two) times daily.   cyclobenzaprine  (FLEXERIL ) 10 MG tablet Take 1 tablet (10 mg total) by mouth 3 (three) times daily as needed for muscle spasms.   hydrOXYzine  (ATARAX ) 25 MG tablet Take 1 tablet (25 mg total) by mouth every 6 (six) hours.    probenecid  (BENEMID ) 500 MG tablet TAKE 1 TABLET (500 MG TOTAL) BY MOUTH 2 (TWO) TIMES DAILY. TAKE 1/2 TABLET TWICE A DAY FOR THE FIRST WEEK, THEN MAY INCREASE TO 1 TABLET TWICE DAILY   meloxicam  (MOBIC ) 15 MG tablet Take 1 tablet (15 mg total) by mouth daily. (Patient not taking: Reported on 06/20/2023)   methocarbamol  (ROBAXIN ) 500 MG tablet Take 2 tablets (1,000 mg total) by mouth every 8 (eight) hours as needed for muscle spasms. (Patient not taking: Reported on 06/20/2023)   methylPREDNISolone  (MEDROL  DOSEPAK) 4 MG TBPK tablet Take per packet instructions. Taper dosing. (Patient not taking: Reported on 06/20/2023)   No facility-administered encounter medications on file as of 11/03/2023.    Review of Systems  Review of Systems  Constitutional: Negative.   HENT: Negative.    Cardiovascular: Negative.   Gastrointestinal: Negative.   Allergic/Immunologic: Negative.   Neurological: Negative.   Psychiatric/Behavioral: Negative.       Objective:   BP 120/73   Pulse 79   Temp 98.2 F (36.8 C) (Oral)   Wt 170 lb 12.8 oz (77.5 kg)   SpO2 100%   BMI 29.32 kg/m   Wt Readings from Last 5 Encounters:  11/03/23 170 lb 12.8 oz (77.5 kg)  09/10/23 171 lb (77.6 kg)  08/05/23 167 lb (75.8 kg)  06/20/23 173 lb (78.5 kg)  04/23/23 180 lb (81.6 kg)     Physical Exam Vitals and nursing  note reviewed.  Constitutional:      General: She is not in acute distress.    Appearance: She is well-developed.  Cardiovascular:     Rate and Rhythm: Normal rate and regular rhythm.  Pulmonary:     Effort: Pulmonary effort is normal.     Breath sounds: Normal breath sounds.  Neurological:     Mental Status: She is alert and oriented to person, place, and time.       Assessment & Plan:   Thyroid disorder screen -     TSH  Lipid screening -     Lipid panel  Essential hypertension -     CBC -     Comprehensive metabolic panel with GFR     Return in about 6 months (around 05/04/2024).    Jerrlyn Morel, NP 11/03/2023

## 2023-11-04 LAB — COMPREHENSIVE METABOLIC PANEL WITH GFR
ALT: 9 IU/L (ref 0–32)
AST: 15 IU/L (ref 0–40)
Albumin: 4.1 g/dL (ref 3.9–4.9)
Alkaline Phosphatase: 47 IU/L (ref 44–121)
BUN/Creatinine Ratio: 17 (ref 9–23)
BUN: 24 mg/dL (ref 6–24)
Bilirubin Total: 0.3 mg/dL (ref 0.0–1.2)
CO2: 20 mmol/L (ref 20–29)
Calcium: 9.2 mg/dL (ref 8.7–10.2)
Chloride: 102 mmol/L (ref 96–106)
Creatinine, Ser: 1.38 mg/dL — ABNORMAL HIGH (ref 0.57–1.00)
Globulin, Total: 3.2 g/dL (ref 1.5–4.5)
Glucose: 91 mg/dL (ref 70–99)
Potassium: 4.3 mmol/L (ref 3.5–5.2)
Sodium: 137 mmol/L (ref 134–144)
Total Protein: 7.3 g/dL (ref 6.0–8.5)
eGFR: 48 mL/min/{1.73_m2} — ABNORMAL LOW (ref >59)

## 2023-11-04 LAB — TSH: TSH: 0.954 u[IU]/mL (ref 0.450–4.500)

## 2023-11-04 LAB — CBC
Hematocrit: 33.1 % — ABNORMAL LOW (ref 34.0–46.6)
Hemoglobin: 10.7 g/dL — ABNORMAL LOW (ref 11.1–15.9)
MCH: 25.7 pg — ABNORMAL LOW (ref 26.6–33.0)
MCHC: 32.3 g/dL (ref 31.5–35.7)
MCV: 79 fL (ref 79–97)
Platelets: 243 10*3/uL (ref 150–450)
RBC: 4.17 x10E6/uL (ref 3.77–5.28)
RDW: 14.4 % (ref 11.7–15.4)
WBC: 6.2 10*3/uL (ref 3.4–10.8)

## 2023-11-04 LAB — LIPID PANEL
Chol/HDL Ratio: 1.8 ratio (ref 0.0–4.4)
Cholesterol, Total: 220 mg/dL — ABNORMAL HIGH (ref 100–199)
HDL: 124 mg/dL (ref >39)
LDL Chol Calc (NIH): 82 mg/dL (ref 0–99)
Triglycerides: 80 mg/dL (ref 0–149)
VLDL Cholesterol Cal: 14 mg/dL (ref 5–40)

## 2023-12-12 ENCOUNTER — Ambulatory Visit: Payer: Self-pay

## 2023-12-12 ENCOUNTER — Other Ambulatory Visit: Payer: Self-pay | Admitting: Nurse Practitioner

## 2023-12-12 MED ORDER — PREDNISONE 10 MG PO TABS: ORAL_TABLET | ORAL | 0 refills | Status: DC

## 2023-12-12 NOTE — Telephone Encounter (Signed)
 Copied from CRM [id number]. Topic: Clinical - Red Word Triage >> Dec 12, 2023  8:34 AM Donald Frost wrote: Red Word that prompted transfer to Nurse Triage: The patient called in stating she has been having right foot pain and a bad gout flare up since yesterday. I will transfer to E2C2 NT Reason for Disposition  [1] Swollen foot AND [2] no fever  (Exceptions: localized bump from bunions, calluses, insect bite, sting)    Pt stated right foot severe gout pain: redness & swelling noted. No appts available to PCP office until 01/2024: Pt requesting prednisone , doctor's note or if PCP would like pt to go to urgent care.  Answer Assessment - Initial Assessment Questions 1. ONSET: "When did the pain start?"      yesterday 2. LOCATION: "Where is the pain located?"      Top of right foot - pt states pain is r/t gout 3. PAIN: "How bad is the pain?"    (Scale 1-10; or mild, moderate, severe)  - MILD (1-3): doesn't interfere with normal activities.   - MODERATE (4-7): interferes with normal activities (e.g., work or school) or awakens from sleep, limping.   - SEVERE (8-10): excruciating pain, unable to do any normal activities, unable to walk.      8/10 when walking only: mild pain without movement 4. WORK OR EXERCISE: "Has there been any recent work or exercise that involved this part of the body?"      N/a 5. CAUSE: "What do you think is causing the foot pain?"     Gout flare up - take medications daily for gout: missed 2 days  6. OTHER SYMPTOMS: "Do you have any other symptoms?" (e.g., leg 8/10 right foot pain, rash, fever, numbness)     Redness, swelling-mild 7. PREGNANCY: "Is there any chance you are pregnant?" "When was your last menstrual period?"     N/a  * pt stated pain is so bad that pt was not able to go to work today and is requesting doctor's note to cover for being out.  Pt wanted to schedule appt but no opening with providers at facility: referred pt to urgent care: pt wants to ask PCP to order  prednisone  to help with gout flare up and/or requesting call pt back about Rx or inform pt to go to urgent care.  Protocols used: Foot Pain-A-AH

## 2023-12-12 NOTE — Telephone Encounter (Signed)
 Routed to pcp. KH

## 2023-12-15 NOTE — Telephone Encounter (Signed)
Pt advised. KH 

## 2023-12-25 ENCOUNTER — Other Ambulatory Visit: Payer: Self-pay | Admitting: Nurse Practitioner

## 2023-12-25 DIAGNOSIS — I10 Essential (primary) hypertension: Secondary | ICD-10-CM

## 2023-12-30 ENCOUNTER — Other Ambulatory Visit: Payer: Self-pay | Admitting: Nurse Practitioner

## 2023-12-31 NOTE — Telephone Encounter (Signed)
 Please advise [facility]

## 2024-01-01 ENCOUNTER — Other Ambulatory Visit (HOSPITAL_BASED_OUTPATIENT_CLINIC_OR_DEPARTMENT_OTHER): Payer: Self-pay

## 2024-01-01 ENCOUNTER — Encounter (HOSPITAL_BASED_OUTPATIENT_CLINIC_OR_DEPARTMENT_OTHER): Payer: Self-pay | Admitting: Student

## 2024-01-01 ENCOUNTER — Ambulatory Visit: Payer: Self-pay | Admitting: *Deleted

## 2024-01-01 ENCOUNTER — Ambulatory Visit (HOSPITAL_BASED_OUTPATIENT_CLINIC_OR_DEPARTMENT_OTHER): Admitting: Student

## 2024-01-01 DIAGNOSIS — M79672 Pain in left foot: Secondary | ICD-10-CM

## 2024-01-01 DIAGNOSIS — M1A09X0 Idiopathic chronic gout, multiple sites, without tophus (tophi): Secondary | ICD-10-CM | POA: Diagnosis not present

## 2024-01-01 MED ORDER — METHYLPREDNISOLONE 4 MG PO TBPK
ORAL_TABLET | ORAL | 0 refills | Status: DC
  Filled 2024-01-01: qty 21, 6d supply, fill #0

## 2024-01-01 NOTE — Telephone Encounter (Signed)
 FYI Only or Action Required?: FYI only for provider.  Patient was last seen in primary care on 11/03/2023 by Oley Bascom RAMAN, NP. Called Nurse Triage reporting Foot Pain. Symptoms began yesterday. Interventions attempted: Prescription medications: Colchicine . Symptoms are: gradually worsening.  Triage Disposition: See PCP When Office is Open (Within 3 Days)  Patient/caregiver understands and will follow disposition?: Call to [facility]- no open appointment- UC/mobile unit care options discussed   Reason for Disposition  [1] MODERATE pain (e.g., interferes with normal activities, limping) AND [2] present > 3 days  Answer Assessment - Initial Assessment Questions 1. ONSET: When did the pain start?      Yesterday evenin 2. LOCATION: Where is the pain located?      Top of eft foot 3. PAIN: How bad is the pain?    (Scale 1-10; or mild, moderate, severe)  - MILD (1-3): doesn't interfere with normal activities.   - MODERATE (4-7): interferes with normal activities (e.g., work or school) or awakens from sleep, limping.   - SEVERE (8-10): excruciating pain, unable to do any normal activities, unable to walk.      8/10 4. WORK OR EXERCISE: Has there been any recent work or exercise that involved this part of the body?      Yes- on feet at work 5. CAUSE: What do you think is causing the foot pain?     Hx Gout in left fot 6. OTHER SYMPTOMS: Do you have any other symptoms? (e.g., leg pain, rash, fever, numbness)     no  Protocols used: Foot Pain-A-AH   Copied from CRM [id number]. Topic: Clinical - Red Word Triage >> Jan 01, 2024  8:58 AM Willma R wrote: [facility] that prompted transfer to Nurse Triage: Patient states has a gout flare up. Experiencing pain and swelling on the top of her left foot.

## 2024-01-01 NOTE — Progress Notes (Signed)
 Chief Complaint: Left foot pain     History of Present Illness:    Suzanne Graham is a 43 y.o. female who presents to clinic today for evaluation of acute left foot pain.  She is a patient of Dr. 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  She takes colchicine  and probenecid  daily, although states that she did run out of probenecid  a few days ago.  Patient does report however that her gout flares have been more controlled recently since these medication changes.  Pain began in the foot yesterday without any known injury and she has experienced swelling in the area.  This does make it difficult to walk due to discomfort.  Last uric acid was 7.7 on 07/29/2023.   Surgical History:   None  PMH/PSH/Family History/Social History/Meds/Allergies:    Past Medical History:  Diagnosis Date   Acid reflux 08/2019   Asthma    Depression    Gout    Hypertension    Stroke [facility])    Past Surgical History:  Procedure Laterality Date   LOOP RECORDER INSERTION N/A 08/19/2017   Procedure: LOOP RECORDER INSERTION;  Surgeon: Kelsie Agent, MD;  Location: [facility] INVASIVE CV LAB;  Service: Cardiovascular;  Laterality: N/A;   TEE WITHOUT CARDIOVERSION N/A 08/19/2017   Procedure: TRANSESOPHAGEAL ECHOCARDIOGRAM (TEE);  Surgeon: Okey Vina GAILS, MD;  Location: [facility] ENDOSCOPY;  Service: Cardiovascular;  Laterality: N/A;   Social History   Socioeconomic History   Marital status: Single    Spouse name: Not on file   Number of children: Not on file   Years of education: Not on file   Highest education level: Not on file  Occupational History   Not on file  Tobacco Use   Smoking status: Every Day    Current packs/day: 0.25    Average packs/day: 0.3 packs/day for 7.0 years (1.8 ttl pk-yrs)    Types: Cigarettes   Smokeless tobacco: Never  Vaping Use   Vaping status: Never Used  Substance and Sexual Activity   Alcohol use: Yes    Comment:  rare   Drug use: No   Sexual activity: Not Currently  Other Topics Concern   Not on file  Social History Narrative   Not on file   Social Drivers of Health   Financial Resource Strain: Not on file  Food Insecurity: No Food Insecurity (11/03/2023)   Hunger Vital Sign    Worried About Running Out of Food in the Last Year: Never true    Ran Out of Food in the Last Year: Never true  Transportation Needs: No Transportation Needs (11/03/2023)   PRAPARE - Administrator, Civil Service (Medical): No    Lack of Transportation (Non-Medical): No  Physical Activity: Not on file  Stress: Not on file  Social Connections: Not on file   Family History  Problem Relation Age of Onset   Heart failure Mother    Hypertension Mother    Stroke Neg Hx    Allergies  Allergen Reactions   Nsaids Other (See Comments)    Likely causing AKI   Current Outpatient Medications  Medication Sig Dispense Refill   methylPREDNISolone  (MEDROL  DOSEPAK) 4 MG TBPK tablet Take per packet instructions 21 each 0   acetaminophen  (TYLENOL ) 500 MG tablet Take  500 mg by mouth every 6 (six) hours as needed for moderate pain or headache.     albuterol  (VENTOLIN  HFA) 108 (90 Base) MCG/ACT inhaler Inhale 2 puffs into the lungs every 6 (six) hours as needed for wheezing or shortness of breath. 18 g 2   amLODipine  (NORVASC ) 10 MG tablet TAKE 1 TABLET BY MOUTH EVERY DAY 30 tablet 5   colchicine  0.6 MG tablet Take 1 tablet (0.6 mg total) by mouth 2 (two) times daily. 60 tablet 2   cyclobenzaprine  (FLEXERIL ) 10 MG tablet Take 1 tablet (10 mg total) by mouth 3 (three) times daily as needed for muscle spasms. 30 tablet 0   hydrOXYzine  (ATARAX ) 25 MG tablet Take 1 tablet (25 mg total) by mouth every 6 (six) hours. 12 tablet 0   meloxicam  (MOBIC ) 15 MG tablet Take 1 tablet (15 mg total) by mouth daily. (Patient not taking: Reported on 06/20/2023) 21 tablet 0   methocarbamol  (ROBAXIN ) 500 MG tablet Take 2 tablets (1,000 mg  total) by mouth every 8 (eight) hours as needed for muscle spasms. (Patient not taking: Reported on 06/20/2023) 30 tablet 0   predniSONE  (DELTASONE ) 10 MG tablet Take 4 tabs for 2 days, then 3 tabs for 2 days, then 2 tabs for 2 days, then 1 tab for 2 days, then stop 20 tablet 0   probenecid  (BENEMID ) 500 MG tablet TAKE 1 TABLET (500 MG TOTAL) BY MOUTH 2 (TWO) TIMES DAILY. TAKE 1/2 TABLET TWICE A DAY FOR THE FIRST WEEK, THEN MAY INCREASE TO 1 TABLET TWICE DAILY 60 tablet 1   No current facility-administered medications for this visit.   No results found.  Review of Systems:   A ROS was performed including pertinent positives and negatives as documented in the HPI.  Physical Exam :   Constitutional: NAD and appears stated age Neurological: Alert and oriented Psych: Appropriate affect and cooperative There were no vitals taken for this visit.   Comprehensive Musculoskeletal Exam:    Exam of the left foot demonstrates moderate swelling in the forefoot with mild swelling and erythema particularly noted of the third digit.  This area is tender to palpation.  No significant warmth or diffuse erythema.  Dorsalis pedis pulse 2+.  Distal sensation is intact.  Imaging:     Assessment:   43 y.o. female with history of numerous gout flares in multiple sites presenting with acute left foot pain and swelling.  This does appear consistent with an acute gout flare which I suspect is a result of her running out of her probenecid  a few days ago.  No known recent dietary causes.  Recommend today to begin a Medrol  Dosepak, which have worked well historically to control her acute flares.  Overall her gout flares have been much more controlled recently which is encouraging, so I would recommend continuing with current medication regimen and continuing to follow with Dr. Burnetta and/or her PCP for further management and prevention.  Last uric acid was approximately 5 months ago so this may need to be repeated  soon.  Plan :    - Start Medrol  Dosepak and continue following with Dr. Vinson for gout management     I personally saw and evaluated the patient, and participated in the management and treatment plan.  Leonce Reveal, PA-C Orthopedics

## 2024-01-12 ENCOUNTER — Other Ambulatory Visit: Payer: Self-pay | Admitting: Nurse Practitioner

## 2024-01-12 DIAGNOSIS — R0602 Shortness of breath: Secondary | ICD-10-CM

## 2024-01-12 MED ORDER — ALBUTEROL SULFATE HFA 108 (90 BASE) MCG/ACT IN AERS: 2.0000 | INHALATION_SPRAY | Freq: Four times a day (QID) | RESPIRATORY_TRACT | 2 refills | Status: DC | PRN

## 2024-01-12 NOTE — Telephone Encounter (Signed)
 Copied from CRM [id number]. Topic: Clinical - Medication Refill >> Jan 12, 2024  3:09 PM Essie A wrote: Medication: albuterol  (VENTOLIN  HFA) 108 (90 Base) MCG/ACT inhaler  Has the patient contacted their pharmacy? Yes (Agent: If no, request that the patient contact the pharmacy for the refill. If patient does not wish to contact the pharmacy document the reason why and proceed with request.) (Agent: If yes, when and what did the pharmacy advise?)  This is the patient's preferred pharmacy:  [id number] [address], [place] - [address] [place] [place] [address] Phone: [phone] Fax: [phone]  Is this the correct pharmacy for this prescription? Yes If no, delete pharmacy and type the correct one.   Has the prescription been filled recently? No  Is the patient out of the medication? Yes  Has the patient been seen for an appointment in the last year OR does the patient have an upcoming appointment? Yes  Can we respond through MyChart? No  Agent: Please be advised that Rx refills may take up to 3 business days. We ask that you follow-up with your pharmacy.

## 2024-02-20 ENCOUNTER — Encounter: Payer: Self-pay | Admitting: Nurse Practitioner

## 2024-02-20 ENCOUNTER — Ambulatory Visit: Payer: Self-pay

## 2024-02-20 ENCOUNTER — Ambulatory Visit (INDEPENDENT_AMBULATORY_CARE_PROVIDER_SITE_OTHER): Admitting: Nurse Practitioner

## 2024-02-20 VITALS — BP 129/84 | HR 77 | Temp 98.8°F | Wt 175.0 lb

## 2024-02-20 DIAGNOSIS — J039 Acute tonsillitis, unspecified: Secondary | ICD-10-CM

## 2024-02-20 DIAGNOSIS — J029 Acute pharyngitis, unspecified: Secondary | ICD-10-CM | POA: Insufficient documentation

## 2024-02-20 LAB — POCT RAPID STREP A (OFFICE): Rapid Strep A Screen: NEGATIVE

## 2024-02-20 MED ORDER — AMOXICILLIN-POT CLAVULANATE 875-125 MG PO TABS: 1.0000 | ORAL_TABLET | Freq: Two times a day (BID) | ORAL | 0 refills | Status: DC

## 2024-02-20 NOTE — Telephone Encounter (Signed)
 FYI Only or Action Required?: FYI only for provider.  Patient was last seen in primary care on 11/03/2023 by Oley Bascom RAMAN, NP.  Called Nurse Triage reporting Sore Throat.  Symptoms began yesterday.  Interventions attempted: OTC medications: tylenol .  Symptoms are: gradually worsening.  Triage Disposition: See Physician Within 24 Hours  Patient/caregiver understands and will follow disposition?: Yes     Copied from CRM #[id number]. Topic: Clinical - Red Word Triage >> Feb 20, 2024  8:10 AM Delon HERO wrote: Red Word that prompted transfer to Nurse Triage: Patient is calling to report left swollen tonsel that started yesterday with sore throat. Started taking tynelol with no relief. Reason for Disposition  SEVERE throat pain (e.g., excruciating)  Answer Assessment - Initial Assessment Questions 1. ONSET: When did the throat start hurting? (Hours or days ago)      Yesterday morning 2. SEVERITY: How bad is the sore throat? (Scale 1-10; mild, moderate or severe)     8/10  3. STREP EXPOSURE: Has there been any exposure to strep within the past week? If Yes, ask: What type of contact occurred?      Could have been someone at work 4.  VIRAL SYMPTOMS: Are there any symptoms of a cold, such as a runny nose, cough, hoarse voice or red eyes?      no 5. FEVER: Do you have a fever? If Yes, ask: What is your temperature, how was it measured, and when did it start?     no 6. PUS ON THE TONSILS: Is there pus on the tonsils in the back of your throat?     Swollen left tonsil is swollen 7. OTHER SYMPTOMS: Do you have any other symptoms? (e.g., difficulty breathing, headache, rash)     no  Protocols used: Sore Throat-A-AH

## 2024-02-20 NOTE — Patient Instructions (Signed)
 Tonsillitis  - amoxicillin -clavulanate (AUGMENTIN ) 875-125 MG tablet; Take 1 tablet by mouth 2 (two) times daily.  Dispense: 20 tablet; Refill: 0    It is important that you exercise regularly at least 30 minutes 5 times a week as tolerated  Think about what you will eat, plan ahead. Choose  clean, green, fresh or frozen over canned, processed or packaged foods which are more sugary, salty and fatty. 70 to 75% of food eaten should be vegetables and fruit. Three meals at set times with snacks allowed between meals, but they must be fruit or vegetables. Aim to eat over a 12 hour period , example 7 am to 7 pm, and STOP after  your last meal of the day. Drink water,generally about 64 ounces per day, no other drink is as healthy. Fruit juice is best enjoyed in a healthy way, by EATING the fruit.  Thanks for choosing [facility] we consider it a privelige to serve you.

## 2024-02-20 NOTE — Assessment & Plan Note (Signed)
-   Rapid Strep A negative Augmentin  prescribed for suspected tonsillitis Advised Tylenol  as needed, gargle with salt water and drinking warm fluids - amoxicillin -clavulanate (AUGMENTIN ) 875-125 MG tablet; Take 1 tablet by mouth 2 (two) times daily.  Dispense: 20 tablet; Refill: 0

## 2024-02-20 NOTE — Assessment & Plan Note (Addendum)
-   Rapid Strep A negative Augmentin  prescribed for suspected tonsillitis Advised Tylenol  as needed for pain  gargle with salt water and drinking warm fluids - amoxicillin -clavulanate (AUGMENTIN ) 875-125 MG tablet; Take 1 tablet by mouth 2 (two) times daily.  Dispense: 20 tablet; Refill: 0

## 2024-02-20 NOTE — Progress Notes (Signed)
 Acute Office Visit  Subjective:     Patient ID: Suzanne Graham, female    DOB: 08-04-1980, 43 y.o.   MRN: [id number]  Chief Complaint  Patient presents with   Sore Throat    Patient stated that she woke up yesterday with a sore throat and hard to swallow      Discussed the use of AI scribe software for clinical note transcription with the patient, who gave verbal consent to proceed.  History of Present Illness Suzanne Graham  has a past medical history of Acid reflux (08/2019), Asthma, Depression, Gout, Hypertension, and Stroke (HCC). Patient presents with complaints of sore throat, swollen tonsil on the left since yesterday.  She denies fever, chills, cough, wheezing, shortness of breath.      Review of Systems  Constitutional:  Negative for appetite change, chills, fatigue and fever.  HENT:  Positive for sore throat. Negative for congestion, postnasal drip, rhinorrhea and sneezing.   Respiratory:  Negative for cough, shortness of breath and wheezing.   Cardiovascular:  Negative for chest pain, palpitations and leg swelling.  Gastrointestinal:  Negative for abdominal pain, constipation, nausea and vomiting.  Genitourinary:  Negative for difficulty urinating, dysuria, flank pain and frequency.  Musculoskeletal:  Negative for arthralgias, back pain, joint swelling and myalgias.  Skin:  Negative for color change, pallor, rash and wound.  Neurological:  Negative for dizziness, facial asymmetry, weakness, numbness and headaches.  Psychiatric/Behavioral:  Negative for behavioral problems, confusion, self-injury and suicidal ideas.         Objective:    BP 129/84   Pulse 77   Temp 98.8 F (37.1 C) (Oral)   Wt 175 lb (79.4 kg)   SpO2 98%   BMI 30.04 kg/m    Physical Exam Vitals and nursing note reviewed.  Constitutional:      General: She is not in acute distress.    Appearance: Normal appearance. She is not ill-appearing, toxic-appearing or diaphoretic.  HENT:      Mouth/Throat:     Mouth: Mucous membranes are moist.     Pharynx: Oropharynx is clear. No oropharyngeal exudate or posterior oropharyngeal erythema.     Tonsils: 0 on the right. 0 on the left.  Eyes:     General: No scleral icterus.       Right eye: No discharge.        Left eye: No discharge.     Extraocular Movements: Extraocular movements intact.     Conjunctiva/sclera: Conjunctivae normal.  Cardiovascular:     Rate and Rhythm: Normal rate and regular rhythm.     Pulses: Normal pulses.     Heart sounds: Normal heart sounds. No murmur heard.    No friction rub. No gallop.  Pulmonary:     Effort: Pulmonary effort is normal. No respiratory distress.     Breath sounds: Normal breath sounds. No stridor. No wheezing, rhonchi or rales.  Chest:     Chest wall: No tenderness.  Abdominal:     General: There is no distension.     Palpations: Abdomen is soft.     Tenderness: There is no abdominal tenderness. There is no right CVA tenderness, left CVA tenderness or guarding.  Musculoskeletal:        General: No swelling, tenderness, deformity or signs of injury.     Right lower leg: No edema.     Left lower leg: No edema.  Skin:    General: Skin is warm and dry.     Capillary Refill:  Capillary refill takes less than 2 seconds.     Coloration: Skin is not jaundiced or pale.     Findings: No bruising, erythema or lesion.  Neurological:     Mental Status: She is alert and oriented to person, place, and time.     Motor: No weakness.     Coordination: Coordination normal.     Gait: Gait normal.  Psychiatric:        Mood and Affect: Mood normal.        Behavior: Behavior normal.        Thought Content: Thought content normal.        Judgment: Judgment normal.     Results for orders placed or performed in visit on 02/20/24  Rapid Strep A  Result Value Ref Range   Rapid Strep A Screen Negative Negative        Assessment & Plan:   Problem List Items Addressed This Visit        Respiratory   Tonsillitis    - Rapid Strep A negative Augmentin  prescribed for suspected tonsillitis Advised Tylenol  as needed for pain  gargle with salt water and drinking warm fluids - amoxicillin -clavulanate (AUGMENTIN ) 875-125 MG tablet; Take 1 tablet by mouth 2 (two) times daily.  Dispense: 20 tablet; Refill: 0       Relevant Medications   amoxicillin -clavulanate (AUGMENTIN ) 875-125 MG tablet   Other Relevant Orders   Rapid Strep A (Completed)     Other   Sore throat - Primary    - Rapid Strep A negative Augmentin  prescribed for suspected tonsillitis Advised Tylenol  as needed, gargle with salt water and drinking warm fluids - amoxicillin -clavulanate (AUGMENTIN ) 875-125 MG tablet; Take 1 tablet by mouth 2 (two) times daily.  Dispense: 20 tablet; Refill: 0        Meds ordered this encounter  Medications   amoxicillin -clavulanate (AUGMENTIN ) 875-125 MG tablet    Sig: Take 1 tablet by mouth 2 (two) times daily.    Dispense:  20 tablet    Refill:  0    No follow-ups on file.  Akya Fiorello R Kainoah Bartosiewicz, FNP

## 2024-03-17 ENCOUNTER — Encounter: Payer: Self-pay | Admitting: Nurse Practitioner

## 2024-03-17 ENCOUNTER — Ambulatory Visit (INDEPENDENT_AMBULATORY_CARE_PROVIDER_SITE_OTHER): Admitting: Nurse Practitioner

## 2024-03-17 VITALS — BP 116/87 | HR 72 | Temp 98.2°F | Wt 171.0 lb

## 2024-03-17 DIAGNOSIS — J4540 Moderate persistent asthma, uncomplicated: Secondary | ICD-10-CM | POA: Diagnosis not present

## 2024-03-17 MED ORDER — MONTELUKAST SODIUM 10 MG PO TABS: 10.0000 mg | ORAL_TABLET | Freq: Every day | ORAL | 3 refills | Status: AC

## 2024-03-17 MED ORDER — ALBUTEROL SULFATE HFA 108 (90 BASE) MCG/ACT IN AERS: 1.0000 | INHALATION_SPRAY | Freq: Four times a day (QID) | RESPIRATORY_TRACT | 2 refills | Status: DC | PRN

## 2024-03-17 MED ORDER — BUDESONIDE-FORMOTEROL FUMARATE 80-4.5 MCG/ACT IN AERO: 2.0000 | INHALATION_SPRAY | Freq: Two times a day (BID) | RESPIRATORY_TRACT | 3 refills | Status: AC

## 2024-03-17 NOTE — Progress Notes (Signed)
 Subjective   Patient ID: Suzanne Graham, female    DOB: Sep 17, 1980, 43 y.o.   MRN: [id number]  Chief Complaint  Patient presents with   Medical Management of Chronic Issues    Patient stated that her inhaler does not work like it use to work in the past    Referring provider: Oley Bascom RAMAN, NP  Keeva Reisen is a 43 y.o. female with Past Medical History: 08/2019: Acid reflux No date: Asthma No date: Depression No date: Gout No date: Hypertension No date: Stroke [facility])   HPI  Patient presents today for refills on inhalers.  She states that she was previously on Ventolin  which worked better than her albuterol  inhaler.  We will reorder this for her.  We will trial Symbicort  twice daily.  Will also trial Singulair . Denies f/c/s, n/v/d, hemoptysis, PND, leg swelling Denies chest pain or edema      Allergies  Allergen Reactions   Nsaids Other (See Comments)    Likely causing AKI     There is no immunization history on file for this patient.  Tobacco History: Social History   Tobacco Use  Smoking Status Every Day   Current packs/day: 0.25   Average packs/day: 0.3 packs/day for 7.0 years (1.8 ttl pk-yrs)   Types: Cigarettes  Smokeless Tobacco Never   Ready to quit: Not Answered Counseling given: Yes   Outpatient Encounter Medications as of 03/17/2024  Medication Sig   acetaminophen  (TYLENOL ) 500 MG tablet Take 500 mg by mouth every 6 (six) hours as needed for moderate pain or headache.   albuterol  (VENTOLIN  HFA) 108 (90 Base) MCG/ACT inhaler Inhale 2 puffs into the lungs every 6 (six) hours as needed for wheezing or shortness of breath.   albuterol  (VENTOLIN  HFA) 108 (90 Base) MCG/ACT inhaler Inhale 1-2 puffs into the lungs every 6 (six) hours as needed for wheezing or shortness of breath.   amLODipine  (NORVASC ) 10 MG tablet TAKE 1 TABLET BY MOUTH EVERY DAY   amoxicillin -clavulanate (AUGMENTIN ) 875-125 MG tablet Take 1 tablet by mouth 2 (two) times daily.    budesonide -formoterol  (SYMBICORT ) 80-4.5 MCG/ACT inhaler Inhale 2 puffs into the lungs 2 (two) times daily.   colchicine  0.6 MG tablet Take 1 tablet (0.6 mg total) by mouth 2 (two) times daily.   cyclobenzaprine  (FLEXERIL ) 10 MG tablet Take 1 tablet (10 mg total) by mouth 3 (three) times daily as needed for muscle spasms.   hydrOXYzine  (ATARAX ) 25 MG tablet Take 1 tablet (25 mg total) by mouth every 6 (six) hours.   montelukast  (SINGULAIR ) 10 MG tablet Take 1 tablet (10 mg total) by mouth at bedtime.   meloxicam  (MOBIC ) 15 MG tablet Take 1 tablet (15 mg total) by mouth daily. (Patient not taking: Reported on 06/20/2023)   methocarbamol  (ROBAXIN ) 500 MG tablet Take 2 tablets (1,000 mg total) by mouth every 8 (eight) hours as needed for muscle spasms. (Patient not taking: Reported on 06/20/2023)   probenecid  (BENEMID ) 500 MG tablet TAKE 1 TABLET (500 MG TOTAL) BY MOUTH 2 (TWO) TIMES DAILY. TAKE 1/2 TABLET TWICE A DAY FOR THE FIRST WEEK, THEN MAY INCREASE TO 1 TABLET TWICE DAILY   No facility-administered encounter medications on file as of 03/17/2024.    Review of Systems  Review of Systems  Constitutional: Negative.   HENT: Negative.    Cardiovascular: Negative.   Gastrointestinal: Negative.   Allergic/Immunologic: Negative.   Neurological: Negative.   Psychiatric/Behavioral: Negative.       Objective:   BP 116/87  Pulse 72   Temp 98.2 F (36.8 C) (Oral)   Wt 171 lb (77.6 kg)   SpO2 98%   BMI 29.35 kg/m   Wt Readings from Last 5 Encounters:  03/17/24 171 lb (77.6 kg)  02/20/24 175 lb (79.4 kg)  11/03/23 170 lb 12.8 oz (77.5 kg)  09/10/23 171 lb (77.6 kg)  08/05/23 167 lb (75.8 kg)     Physical Exam Vitals and nursing note reviewed.  Constitutional:      General: She is not in acute distress.    Appearance: She is well-developed.  Cardiovascular:     Rate and Rhythm: Normal rate and regular rhythm.  Pulmonary:     Effort: Pulmonary effort is normal.     Breath  sounds: Normal breath sounds.  Neurological:     Mental Status: She is alert and oriented to person, place, and time.       Assessment & Plan:   Moderate persistent asthma, unspecified whether complicated -     Albuterol  Sulfate HFA; Inhale 1-2 puffs into the lungs every 6 (six) hours as needed for wheezing or shortness of breath.  Dispense: 8 g; Refill: 2 -     Budesonide -Formoterol  Fumarate; Inhale 2 puffs into the lungs 2 (two) times daily.  Dispense: 1 each; Refill: 3 -     Montelukast  Sodium; Take 1 tablet (10 mg total) by mouth at bedtime.  Dispense: 30 tablet; Refill: 3     Return if symptoms worsen or fail to improve.   Bascom GORMAN Borer, NP 03/17/2024

## 2024-03-22 ENCOUNTER — Other Ambulatory Visit: Payer: Self-pay | Admitting: Nurse Practitioner

## 2024-03-22 DIAGNOSIS — M1A0790 Idiopathic chronic gout, unspecified ankle and foot, without tophus (tophi): Secondary | ICD-10-CM

## 2024-03-30 NOTE — Progress Notes (Deleted)
 Office Visit Note  Patient: Suzanne Graham             Date of Birth: 09-Oct-1980           MRN: [id number]             PCP: Oley Bascom RAMAN, NP Referring: Oley Bascom RAMAN, NP Visit Date: 04/13/2024   Subjective:  No chief complaint on file.   History of Present Illness: Suzanne Graham is a 43 y.o. female here for follow up ***   Previous HPI 10/23/21 Suzanne Graham is a 43 y.o. female here for follow up for gout on allopurinol  200 mg daily and colchicine . She is generally doing well she has some mild upper respiratory symptoms she attributes to seasonal allergies. Within the past week new pain in both feet started on the left foot on dorsal and lateral side and within past 2 days started having pain in similar area of the right foot. She recalls doing a lot of walking in possible wrong sized shoes but not sure about any other preceding event.    Previous HPI 07/25/21 Suzanne Graham is a 43 y.o. 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  After our last visit her symptoms improved quickly with the short prednisone  taper but then returned again.  Right now having the most trouble with pain and swelling throughout her foot and ankle.  She is not experience any trouble taking the allopurinol  or colchicine .   No Rheumatology ROS completed.   PMFS History:  Patient Active Problem List   Diagnosis Date Noted   Tonsillitis 02/20/2024   Sore throat 02/20/2024   Effusion of left knee 03/20/2023   Neck pain on right side 03/20/2023   Primary osteoarthritis of left knee 02/07/2023   Gout involving toe of right foot 02/07/2023   Bursitis of left knee 11/01/2022   Dental abscess 09/19/2022   High risk medication use 06/22/2021   Systemic inflammatory response syndrome (HCC) 03/31/2021   Inflammatory arthritis 03/29/2021   CKD (chronic kidney disease),  stage III (HCC) 03/29/2021   Microcytic anemia 03/29/2021   AKI (acute kidney injury) 03/11/2021   Adverse reaction to non-steroidal anti-inflammatory drug (NSAID), initial encounter 03/11/2021   Vitamin D  deficiency 11/17/2019   Chronic gout of foot 10/11/2018   Current smoker 10/11/2018   Generalized pain 10/11/2018   Chronic nonintractable headache 08/06/2017   Essential hypertension 08/06/2017   Stroke (HCC) 07/17/2017    Past Medical History:  Diagnosis Date   Acid reflux 08/2019   Asthma    Depression    Gout    Hypertension    Stroke (HCC)     Family History  Problem Relation Age of Onset   Heart failure Mother    Hypertension Mother    Stroke Neg Hx    Past Surgical History:  Procedure Laterality Date   LOOP RECORDER INSERTION N/A 08/19/2017   Procedure: LOOP RECORDER INSERTION;  Surgeon: Kelsie Agent, MD;  Location: [facility];  Service: Cardiovascular;  Laterality: N/A;   TEE WITHOUT CARDIOVERSION N/A 08/19/2017   Procedure: TRANSESOPHAGEAL ECHOCARDIOGRAM (TEE);  Surgeon: Okey Vina GAILS, MD;  Location[facility] ENDOSCOPY;  Service: Cardiovascular;  Laterality: N/A;   Social History   Social History Narrative   Not on file    There is no immunization history on file for this patient.  Objective: Vital Signs: There were no vitals taken for this visit.   Physical Exam   Musculoskeletal Exam: ***  CDAI Exam: CDAI Score: -- Patient Global: --; Provider Global: -- Swollen: --; Tender: -- Joint Exam 04/13/2024   No joint exam has been documented for this visit   There is currently no information documented on the homunculus. Go to the Rheumatology activity and complete the homunculus joint exam.  Investigation: No additional findings.  Imaging: DG Chest 2 View Result Date: 04/01/2024 EXAM: 2 VIEW(S) XRAY OF THE CHEST 04/01/2024 12:35:00 PM COMPARISON: 09/10/2023 CLINICAL HISTORY: sob FINDINGS: LUNGS AND PLEURA: No focal pulmonary opacity. No  pulmonary edema. No pleural effusion. No pneumothorax. HEART AND MEDIASTINUM: Left chest wall cardiac loop recorder stable in place. Mild-to-moderate aortic arch calcification. BONES AND SOFT TISSUES: Mild-to-moderate multilevel degenerative disc changes of thoracic spine. No acute osseous abnormality. IMPRESSION: 1. No acute cardiopulmonary abnormality. Electronically signed by: Waddell Calk MD 04/01/2024 12:58 PM EDT RP Workstation: [id number]    Recent Labs: Lab Results  Component Value Date   WBC 6.0 04/01/2024   HGB 10.8 (L) 04/01/2024   PLT 203 04/01/2024   NA 135 04/01/2024   K 4.2 04/01/2024   CL 102 04/01/2024   CO2 16 (L) 04/01/2024   GLUCOSE 58 (L) 04/01/2024   BUN 14 04/01/2024   CREATININE 1.04 (H) 04/01/2024   BILITOT 0.3 11/03/2023   ALKPHOS 47 11/03/2023   AST 15 11/03/2023   ALT 9 11/03/2023   PROT 7.3 11/03/2023   ALBUMIN 4.1 11/03/2023   CALCIUM  8.7 (L) 04/01/2024   GFRAA 72 11/15/2019   QFTBGOLDPLUS NEGATIVE 06/22/2021    Speciality Comments: No specialty comments available.  Procedures:  No procedures performed Allergies: Nsaids   Assessment / Plan:     Visit Diagnoses: No diagnosis found.  ***  Orders: No orders of the defined types were placed in this encounter.  No orders of the defined types were placed in this encounter.    Follow-Up Instructions: No follow-ups on file.   Lonni LELON Ester, MD  Note - This record has been created using [employer].  Chart creation errors have been sought, but may not always  have been located. Such creation errors do not reflect on  the standard of medical care.

## 2024-04-01 ENCOUNTER — Emergency Department (HOSPITAL_COMMUNITY)

## 2024-04-01 ENCOUNTER — Ambulatory Visit: Payer: Self-pay

## 2024-04-01 ENCOUNTER — Emergency Department (HOSPITAL_COMMUNITY)
Admission: EM | Admit: 2024-04-01 | Discharge: 2024-04-01 | Disposition: A | Discharge status: Home / Self Care | Attending: Emergency Medicine | Admitting: Emergency Medicine

## 2024-04-01 ENCOUNTER — Encounter (HOSPITAL_COMMUNITY): Payer: Self-pay

## 2024-04-01 ENCOUNTER — Other Ambulatory Visit: Payer: Self-pay

## 2024-04-01 DIAGNOSIS — J4541 Moderate persistent asthma with (acute) exacerbation: Secondary | ICD-10-CM | POA: Insufficient documentation

## 2024-04-01 DIAGNOSIS — F419 Anxiety disorder, unspecified: Secondary | ICD-10-CM | POA: Diagnosis not present

## 2024-04-01 DIAGNOSIS — Z7951 Long term (current) use of inhaled steroids: Secondary | ICD-10-CM | POA: Insufficient documentation

## 2024-04-01 DIAGNOSIS — J45909 Unspecified asthma, uncomplicated: Secondary | ICD-10-CM | POA: Diagnosis present

## 2024-04-01 LAB — CBC WITH DIFFERENTIAL/PLATELET
Abs Immature Granulocytes: 0.02 K/uL (ref 0.00–0.07)
Basophils Absolute: 0.1 K/uL (ref 0.0–0.1)
Basophils Relative: 1 %
Eosinophils Absolute: 0.2 K/uL (ref 0.0–0.5)
Eosinophils Relative: 3 %
HCT: 34.2 % — ABNORMAL LOW (ref 36.0–46.0)
Hemoglobin: 10.8 g/dL — ABNORMAL LOW (ref 12.0–15.0)
Immature Granulocytes: 0 %
Lymphocytes Relative: 22 %
Lymphs Abs: 1.3 K/uL (ref 0.7–4.0)
MCH: 25.4 pg — ABNORMAL LOW (ref 26.0–34.0)
MCHC: 31.6 g/dL (ref 30.0–36.0)
MCV: 80.3 fL (ref 80.0–100.0)
Monocytes Absolute: 0.4 K/uL (ref 0.1–1.0)
Monocytes Relative: 7 %
Neutro Abs: 4 K/uL (ref 1.7–7.7)
Neutrophils Relative %: 67 %
Platelets: 203 K/uL (ref 150–400)
RBC: 4.26 MIL/uL (ref 3.87–5.11)
RDW: 14.1 % (ref 11.5–15.5)
WBC: 6 K/uL (ref 4.0–10.5)
nRBC: 0 % (ref 0.0–0.2)

## 2024-04-01 LAB — BASIC METABOLIC PANEL WITH GFR
Anion gap: 17 — ABNORMAL HIGH (ref 5–15)
BUN: 14 mg/dL (ref 6–20)
CO2: 16 mmol/L — ABNORMAL LOW (ref 22–32)
Calcium: 8.7 mg/dL — ABNORMAL LOW (ref 8.9–10.3)
Chloride: 102 mmol/L (ref 98–111)
Creatinine, Ser: 1.04 mg/dL — ABNORMAL HIGH (ref 0.44–1.00)
GFR, Estimated: >60 mL/min (ref >60)
Glucose, Bld: 58 mg/dL — ABNORMAL LOW (ref 70–99)
Potassium: 4.2 mmol/L (ref 3.5–5.1)
Sodium: 135 mmol/L (ref 135–145)

## 2024-04-01 LAB — HCG, SERUM, QUALITATIVE: Preg, Serum: NEGATIVE

## 2024-04-01 MED ORDER — PREDNISONE 20 MG PO TABS
60.0000 mg | ORAL_TABLET | Freq: Once | ORAL | Status: AC
  Administered 2024-04-01: 60 mg via ORAL
  Filled 2024-04-01: qty 3

## 2024-04-01 MED ORDER — HYDROXYZINE HCL 25 MG PO TABS: 25.0000 mg | ORAL_TABLET | Freq: Four times a day (QID) | ORAL | 0 refills | Status: AC

## 2024-04-01 MED ORDER — HYDROXYZINE HCL 25 MG PO TABS
25.0000 mg | ORAL_TABLET | Freq: Once | ORAL | Status: AC
  Administered 2024-04-01: 25 mg via ORAL
  Filled 2024-04-01: qty 1

## 2024-04-01 MED ORDER — PREDNISONE 20 MG PO TABS: ORAL_TABLET | ORAL | 0 refills | Status: DC

## 2024-04-01 NOTE — ED Triage Notes (Signed)
 Reports having difficulty breathing x 2 weeks and related to ashtma and has been using rescue inhaler but it works sometimes and doesn't which leads to her anxiety worsening.

## 2024-04-01 NOTE — Discharge Instructions (Signed)
Use your inhaler every 4 hours(2 puffs) while awake, return for sudden worsening shortness of breath, or if you need to use your inhaler more often.  ° ° °

## 2024-04-01 NOTE — ED Provider Notes (Signed)
 [facility] AT [facility] Provider Note   CSN: [id number] Arrival date & time: 04/01/24  1201     Patient presents with: Asthma   Suzanne Graham is a 43 y.o. female.   43 yo F with a cc of difficulty breathing.  Going on for couple weeks.  Feels like her typical asthma.  She also thinks that she struggles with anxiety and when she has these bouts of asthma has trouble calming down.  She was seen in the ED previously for this and was prescribed a medicine that she thinks helped quite a bit but recently has run out.  Denies fevers.  Denies sick contacts.   Asthma       Prior to Admission medications   Medication Sig Start Date End Date Taking? Authorizing Provider  predniSONE  (DELTASONE ) 20 MG tablet 2 tabs po daily x 4 days 04/01/24  Yes Emil Share, DO  acetaminophen  (TYLENOL ) 500 MG tablet Take 500 mg by mouth every 6 (six) hours as needed for moderate pain or headache.    [provider]  albuterol  (VENTOLIN  HFA) 108 (90 Base) MCG/ACT inhaler Inhale 2 puffs into the lungs every 6 (six) hours as needed for wheezing or shortness of breath. 01/12/24   Oley Bascom RAMAN, NP  albuterol  (VENTOLIN  HFA) 108 (90 Base) MCG/ACT inhaler Inhale 1-2 puffs into the lungs every 6 (six) hours as needed for wheezing or shortness of breath. 03/17/24   Oley Bascom RAMAN, NP  amLODipine  (NORVASC ) 10 MG tablet TAKE 1 TABLET BY MOUTH EVERY DAY 12/25/23   Nichols, Tonya S, NP  amoxicillin -clavulanate (AUGMENTIN ) 875-125 MG tablet Take 1 tablet by mouth 2 (two) times daily. 02/20/24   Paseda, Folashade R, FNP  budesonide -formoterol  (SYMBICORT ) 80-4.5 MCG/ACT inhaler Inhale 2 puffs into the lungs 2 (two) times daily. 03/17/24   Oley Bascom RAMAN, NP  colchicine  0.6 MG tablet TAKE 1 TABLET BY MOUTH 2 TIMES DAILY. 03/22/24   Oley Bascom RAMAN, NP  cyclobenzaprine  (FLEXERIL ) 10 MG tablet Take 1 tablet (10 mg total) by mouth 3 (three) times daily as needed for muscle spasms. 08/05/23    Oley Bascom RAMAN, NP  hydrOXYzine  (ATARAX ) 25 MG tablet Take 1 tablet (25 mg total) by mouth every 6 (six) hours. 04/01/24   Emil Share, DO  meloxicam  (MOBIC ) 15 MG tablet Take 1 tablet (15 mg total) by mouth daily. Patient not taking: Reported on 06/20/2023 01/28/23   Brooks, Dana, DO  methocarbamol  (ROBAXIN ) 500 MG tablet Take 2 tablets (1,000 mg total) by mouth every 8 (eight) hours as needed for muscle spasms. Patient not taking: Reported on 06/20/2023 03/13/23   Geiple, Joshua, PA-C  montelukast  (SINGULAIR ) 10 MG tablet Take 1 tablet (10 mg total) by mouth at bedtime. 03/17/24   Nichols, Tonya S, NP  probenecid  (BENEMID ) 500 MG tablet TAKE 1 TABLET (500 MG TOTAL) BY MOUTH 2 (TWO) TIMES DAILY. TAKE 1/2 TABLET TWICE A DAY FOR THE FIRST WEEK, THEN MAY INCREASE TO 1 TABLET TWICE DAILY 10/20/23 12/19/23  Brooks, Dana, DO    Allergies: Nsaids    Review of Systems  Updated Vital Signs BP 110/80   Pulse 75   Temp 98.5 F (36.9 C)   Resp 16   Ht 5' 4 (1.626 m)   Wt 77.6 kg   SpO2 99%   BMI 29.35 kg/m   Physical Exam Vitals and nursing note reviewed.  Constitutional:      General: She is not in acute distress.  Appearance: She is well-developed. She is not diaphoretic.  HENT:     Head: Normocephalic and atraumatic.  Eyes:     Pupils: Pupils are equal, round, and reactive to light.  Cardiovascular:     Rate and Rhythm: Normal rate and regular rhythm.     Heart sounds: No murmur heard.    No friction rub. No gallop.  Pulmonary:     Effort: Pulmonary effort is normal.     Breath sounds: No wheezing or rales.     Comments: Prolonged expiratory effort without any obvious wheezes Abdominal:     General: There is no distension.     Palpations: Abdomen is soft.     Tenderness: There is no abdominal tenderness.  Musculoskeletal:        General: No tenderness.     Cervical back: Normal range of motion and neck supple.  Skin:    General: Skin is warm and dry.  Neurological:      Mental Status: She is alert and oriented to person, place, and time.  Psychiatric:        Behavior: Behavior normal.     (all labs ordered are listed, but only abnormal results are displayed) Labs Reviewed  BASIC METABOLIC PANEL WITH GFR - Abnormal; Notable for the following components:      Result Value   CO2 16 (*)    Glucose, Bld 58 (*)    Creatinine, Ser 1.04 (*)    Calcium  8.7 (*)    Anion gap 17 (*)    All other components within normal limits  CBC WITH DIFFERENTIAL/PLATELET - Abnormal; Notable for the following components:   Hemoglobin 10.8 (*)    HCT 34.2 (*)    MCH 25.4 (*)    All other components within normal limits  HCG, SERUM, QUALITATIVE    EKG: None  Radiology: DG Chest 2 View Result Date: 04/01/2024 EXAM: 2 VIEW(S) XRAY OF THE CHEST 04/01/2024 12:35:00 PM COMPARISON: 09/10/2023 CLINICAL HISTORY: sob FINDINGS: LUNGS AND PLEURA: No focal pulmonary opacity. No pulmonary edema. No pleural effusion. No pneumothorax. HEART AND MEDIASTINUM: Left chest wall cardiac loop recorder stable in place. Mild-to-moderate aortic arch calcification. BONES AND SOFT TISSUES: Mild-to-moderate multilevel degenerative disc changes of thoracic spine. No acute osseous abnormality. IMPRESSION: 1. No acute cardiopulmonary abnormality. Electronically signed by: Waddell Calk MD 04/01/2024 12:58 PM EDT RP Workstation: [id number]     Procedures   Medications Ordered in the ED  predniSONE  (DELTASONE ) tablet 60 mg (60 mg Oral Given 04/01/24 2017)  hydrOXYzine  (ATARAX ) tablet 25 mg (25 mg Oral Given 04/01/24 2018)                                    Medical Decision Making Amount and/or Complexity of Data Reviewed Labs: ordered. Radiology: ordered.  Risk Prescription drug management.   43 yo F with a chief complaint of difficulty breathing and resultant anxiety after the episode.  She is PERC negative.  Has benign lung exam here.  I we will start on burst dose steroids.  Have her  follow-up with her family doctor in the office.  She is requesting a refill of her hydroxyzine .  9:49 PM:  I have discussed the diagnosis/risks/treatment options with the patient.  Evaluation and diagnostic testing in the emergency department does not suggest an emergent condition requiring admission or immediate intervention beyond what has been performed at this time.  They will follow  up with PCP. We also discussed returning to the ED immediately if new or worsening sx occur. We discussed the sx which are most concerning (e.g., sudden worsening pain, fever, inability to tolerate by mouth) that necessitate immediate return. Medications administered to the patient during their visit and any new prescriptions provided to the patient are listed below.  Medications given during this visit Medications  predniSONE  (DELTASONE ) tablet 60 mg (60 mg Oral Given 04/01/24 2017)  hydrOXYzine  (ATARAX ) tablet 25 mg (25 mg Oral Given 04/01/24 2018)     The patient appears reasonably screen and/or stabilized for discharge and I doubt any other medical condition or other [facility] requiring further screening, evaluation, or treatment in the ED at this time prior to discharge.       Final diagnoses:  Moderate persistent asthma with exacerbation    ED Discharge Orders          Ordered    predniSONE  (DELTASONE ) 20 MG tablet        04/01/24 2009    hydrOXYzine  (ATARAX ) 25 MG tablet  Every 6 hours        04/01/24 2009               Emil Share, DO 04/01/24 2149

## 2024-04-01 NOTE — Telephone Encounter (Signed)
 Please contact pt if able to fit in today, triage protocol is same day visit but none available. She is currently scheduled for tomorrow.  FYI Only or Action Required?: Action required by provider: request for appointment.  Patient was last seen in primary care on 03/17/2024 by Oley Bascom RAMAN, NP.  Called Nurse Triage reporting Medication Assistance.  Symptoms began several weeks ago.  Interventions attempted: Prescription medications: inhaler.  Symptoms are: unchanged.  Triage Disposition: No disposition on file.  Patient/caregiver understands and will follow disposition?:  Reason for Disposition  [1] Longstanding difficulty breathing (e.g., CHF, COPD, emphysema) AND [2] WORSE than normal  Answer Assessment - Initial Assessment Questions Patient states worse with anxiety, is going to take her anxiety medication now. Is using her inhaler. States she forgets to take her singulair  at night, is asking to take it in the morning. States symptoms same since recent visit. Was hoping for same day appt. Denies wheezing.  This RN recommended she set an alarm on her phone as it is typically recommended it be taken at night. Advised to call back if breathing worsens or anxiety medication is not effective.   1. RESPIRATORY STATUS: Describe your breathing? (e.g., wheezing, shortness of breath, unable to speak, severe coughing)      SOB when laying down, worse when anxious  2. ONSET: When did this breathing problem begin?      3-4 weeks  3. PATTERN Does the difficult breathing come and go, or has it been constant since it started?      Intermittent but frequent. Worse when laying down.   4. SEVERITY: How bad is your breathing? (e.g., mild, moderate, severe)      Moderate  5. RECURRENT SYMPTOM: Have you had difficulty breathing before? If Yes, ask: When was the last time? and What happened that time?      Yes  6. CARDIAC HISTORY: Do you have any history of heart disease?  (e.g., heart attack, angina, bypass surgery, angioplasty)      Denies  7. LUNG HISTORY: Do you have any history of lung disease?  (e.g., pulmonary embolus, asthma, emphysema)     Asthma  8. CAUSE: What do you think is causing the breathing problem?      Asthma  Protocols used: Breathing Difficulty-A-AH

## 2024-04-02 ENCOUNTER — Ambulatory Visit: Payer: Self-pay | Admitting: Nurse Practitioner

## 2024-04-13 ENCOUNTER — Ambulatory Visit: Admitting: Internal Medicine

## 2024-05-05 ENCOUNTER — Encounter: Payer: Self-pay | Admitting: Nurse Practitioner

## 2024-05-05 ENCOUNTER — Ambulatory Visit: Payer: Self-pay | Admitting: Nurse Practitioner

## 2024-05-05 VITALS — BP 135/91 | HR 76 | Wt 173.4 lb

## 2024-05-05 DIAGNOSIS — I10 Essential (primary) hypertension: Secondary | ICD-10-CM | POA: Diagnosis not present

## 2024-05-05 DIAGNOSIS — J4540 Moderate persistent asthma, uncomplicated: Secondary | ICD-10-CM

## 2024-05-05 DIAGNOSIS — R350 Frequency of micturition: Secondary | ICD-10-CM

## 2024-05-05 DIAGNOSIS — N390 Urinary tract infection, site not specified: Secondary | ICD-10-CM | POA: Diagnosis not present

## 2024-05-05 LAB — POCT URINALYSIS DIP (CLINITEK)
Glucose, UA: NEGATIVE mg/dL
Nitrite, UA: POSITIVE — AB
POC PROTEIN,UA: 30 — AB
Spec Grav, UA: >=1.03 — AB
Urobilinogen, UA: 0.2 U/dL
pH, UA: 5.5

## 2024-05-05 MED ORDER — SULFAMETHOXAZOLE-TRIMETHOPRIM 800-160 MG PO TABS: 1.0000 | ORAL_TABLET | Freq: Two times a day (BID) | ORAL | 0 refills | Status: AC

## 2024-05-05 NOTE — Progress Notes (Signed)
 Subjective   Patient ID: Suzanne Graham, female    DOB: 03-10-1981, 43 y.o.   MRN: [id number]  Chief Complaint  Patient presents with   Urinary Frequency    Pressure in the lower back    Follow-up   Asthma    Referring provider: Oley Bascom RAMAN, NP  Suzanne Graham is a 43 y.o. female with Past Medical History: 08/2019: Acid reflux No date: Asthma No date: Depression No date: Gout No date: Hypertension No date: Stroke [facility])   Urinary Frequency  This is a new problem. The current episode started in the past 7 days. The problem has been unchanged. The pain is moderate. There has been no fever. There is No history of pyelonephritis. Associated symptoms include flank pain and frequency. She has tried acetaminophen  for the symptoms. The treatment provided mild relief.   UA was nitrite positive and showed leukocytes.  Will order Bactrim and send urine for culture.  Hypertension: Patient here for follow-up. She is not exercising and is adherent to low salt diet.  Does not check B/P regularly at home. Cardiac symptoms none. Patient denies chest pain, claudication, dyspnea, and fatigue.  Cardiovascular risk factors: obesity (BMI >= 30 kg/m2) and sedentary lifestyle. Use of agents associated with hypertension: none. History of target organ damage: none.    Asthma:  Currently on albuterol  inhaler. Asthma has been stable.   Denies f/c/s, n/v/d, hemoptysis, PND, leg swelling Denies chest pain or edema   Allergies  Allergen Reactions   Nsaids Other (See Comments)    Likely causing AKI     There is no immunization history on file for this patient.  Tobacco History: Social History   Tobacco Use  Smoking Status Every Day   Current packs/day: 0.25   Average packs/day: 0.3 packs/day for 7.0 years (1.8 ttl pk-yrs)   Types: Cigarettes  Smokeless Tobacco Never   Ready to quit: Not Answered Counseling given: Not Answered   Outpatient Encounter Medications as of 05/05/2024   Medication Sig   acetaminophen  (TYLENOL ) 500 MG tablet Take 500 mg by mouth every 6 (six) hours as needed for moderate pain or headache.   albuterol  (VENTOLIN  HFA) 108 (90 Base) MCG/ACT inhaler Inhale 2 puffs into the lungs every 6 (six) hours as needed for wheezing or shortness of breath.   albuterol  (VENTOLIN  HFA) 108 (90 Base) MCG/ACT inhaler Inhale 1-2 puffs into the lungs every 6 (six) hours as needed for wheezing or shortness of breath.   amLODipine  (NORVASC ) 10 MG tablet TAKE 1 TABLET BY MOUTH EVERY DAY   amoxicillin -clavulanate (AUGMENTIN ) 875-125 MG tablet Take 1 tablet by mouth 2 (two) times daily.   budesonide -formoterol  (SYMBICORT ) 80-4.5 MCG/ACT inhaler Inhale 2 puffs into the lungs 2 (two) times daily.   colchicine  0.6 MG tablet TAKE 1 TABLET BY MOUTH 2 TIMES DAILY.   cyclobenzaprine  (FLEXERIL ) 10 MG tablet Take 1 tablet (10 mg total) by mouth 3 (three) times daily as needed for muscle spasms.   hydrOXYzine  (ATARAX ) 25 MG tablet Take 1 tablet (25 mg total) by mouth every 6 (six) hours.   montelukast  (SINGULAIR ) 10 MG tablet Take 1 tablet (10 mg total) by mouth at bedtime.   predniSONE  (DELTASONE ) 20 MG tablet 2 tabs po daily x 4 days   sulfamethoxazole-trimethoprim (BACTRIM DS) 800-160 MG tablet Take 1 tablet by mouth 2 (two) times daily for 7 days.   meloxicam  (MOBIC ) 15 MG tablet Take 1 tablet (15 mg total) by mouth daily. (Patient not taking: Reported on 05/05/2024)  methocarbamol  (ROBAXIN ) 500 MG tablet Take 2 tablets (1,000 mg total) by mouth every 8 (eight) hours as needed for muscle spasms. (Patient not taking: Reported on 05/05/2024)   probenecid  (BENEMID ) 500 MG tablet TAKE 1 TABLET (500 MG TOTAL) BY MOUTH 2 (TWO) TIMES DAILY. TAKE 1/2 TABLET TWICE A DAY FOR THE FIRST WEEK, THEN MAY INCREASE TO 1 TABLET TWICE DAILY (Patient not taking: Reported on 05/05/2024)   No facility-administered encounter medications on file as of 05/05/2024.    Review of Systems  Review of  Systems  Constitutional: Negative.   HENT: Negative.    Cardiovascular: Negative.   Gastrointestinal: Negative.   Genitourinary:  Positive for flank pain and frequency.  Allergic/Immunologic: Negative.   Neurological: Negative.   Psychiatric/Behavioral: Negative.       Objective:   BP (!) 135/91 (BP Location: Left Arm, Patient Position: Sitting, Cuff Size: Large)   Pulse 76   Wt 173 lb 6.4 oz (78.7 kg)   SpO2 100%   BMI 29.76 kg/m   Wt Readings from Last 5 Encounters:  05/05/24 173 lb 6.4 oz (78.7 kg)  04/01/24 171 lb (77.6 kg)  03/17/24 171 lb (77.6 kg)  02/20/24 175 lb (79.4 kg)  11/03/23 170 lb 12.8 oz (77.5 kg)     Physical Exam Vitals and nursing note reviewed.  Constitutional:      General: She is not in acute distress.    Appearance: She is well-developed.  Cardiovascular:     Rate and Rhythm: Normal rate and regular rhythm.  Pulmonary:     Effort: Pulmonary effort is normal.     Breath sounds: Normal breath sounds.  Neurological:     Mental Status: She is alert and oriented to person, place, and time.       Assessment & Plan:   Urinary frequency -     POCT URINALYSIS DIP (CLINITEK)  Essential hypertension -     CBC -     Comprehensive metabolic panel with GFR  Moderate persistent asthma, unspecified whether complicated -     CBC -     Comprehensive metabolic panel with GFR  Urinary tract infection without hematuria, site unspecified -     Urine Culture -     Sulfamethoxazole-Trimethoprim; Take 1 tablet by mouth 2 (two) times daily for 7 days.  Dispense: 14 tablet; Refill: 0     Return in about 6 months (around 11/03/2024).     Bascom GORMAN Borer, NP 05/05/2024

## 2024-05-06 LAB — COMPREHENSIVE METABOLIC PANEL WITH GFR
ALT: 11 IU/L (ref 0–32)
AST: 18 IU/L (ref 0–40)
Albumin: 4.3 g/dL (ref 3.9–4.9)
Alkaline Phosphatase: 44 IU/L (ref 41–116)
BUN/Creatinine Ratio: 13 (ref 9–23)
BUN: 22 mg/dL (ref 6–24)
Bilirubin Total: 0.4 mg/dL (ref 0.0–1.2)
CO2: 18 mmol/L — ABNORMAL LOW (ref 20–29)
Calcium: 9.6 mg/dL (ref 8.7–10.2)
Chloride: 102 mmol/L (ref 96–106)
Creatinine, Ser: 1.66 mg/dL — ABNORMAL HIGH (ref 0.57–1.00)
Globulin, Total: 3 g/dL (ref 1.5–4.5)
Glucose: 76 mg/dL (ref 70–99)
Potassium: 4.5 mmol/L (ref 3.5–5.2)
Sodium: 138 mmol/L (ref 134–144)
Total Protein: 7.3 g/dL (ref 6.0–8.5)
eGFR: 39 mL/min/1.73 — ABNORMAL LOW (ref >59)

## 2024-05-06 LAB — CBC
Hematocrit: 32.9 % — ABNORMAL LOW (ref 34.0–46.6)
Hemoglobin: 10.1 g/dL — ABNORMAL LOW (ref 11.1–15.9)
MCH: 25.9 pg — ABNORMAL LOW (ref 26.6–33.0)
MCHC: 30.7 g/dL — ABNORMAL LOW (ref 31.5–35.7)
MCV: 84 fL (ref 79–97)
Platelets: 287 x10E3/uL (ref 150–450)
RBC: 3.9 x10E6/uL (ref 3.77–5.28)
RDW: 14.5 % (ref 11.7–15.4)
WBC: 6.8 x10E3/uL (ref 3.4–10.8)

## 2024-05-07 ENCOUNTER — Ambulatory Visit: Payer: Self-pay | Admitting: Nurse Practitioner

## 2024-05-07 DIAGNOSIS — N289 Disorder of kidney and ureter, unspecified: Secondary | ICD-10-CM

## 2024-05-09 LAB — URINE CULTURE

## 2024-05-10 ENCOUNTER — Encounter: Payer: Self-pay | Admitting: Radiology

## 2024-05-20 ENCOUNTER — Telehealth: Payer: Self-pay

## 2024-05-20 NOTE — Telephone Encounter (Signed)
 Copied from CRM #[id number]. Topic: Referral - Status >> May 20, 2024 10:53 AM Winona R wrote: Pt would like to know when she can schedule with her referral to Nephrology. She has not received a call yet

## 2024-05-21 NOTE — Telephone Encounter (Signed)
 Pt was called and advised to keep answer unknown number to make sure she is scheduled. kh

## 2024-05-31 ENCOUNTER — Other Ambulatory Visit: Payer: Self-pay | Admitting: Nurse Practitioner

## 2024-05-31 ENCOUNTER — Ambulatory Visit
Admission: RE | Admit: 2024-05-31 | Discharge: 2024-05-31 | Disposition: A | Discharge status: Home / Self Care | Payer: Self-pay | Source: Ambulatory Visit | Attending: Nurse Practitioner | Admitting: Nurse Practitioner

## 2024-05-31 ENCOUNTER — Ambulatory Visit: Payer: Self-pay

## 2024-05-31 VITALS — BP 145/97 | HR 86 | Temp 98.4°F | Resp 17

## 2024-05-31 DIAGNOSIS — M25561 Pain in right knee: Secondary | ICD-10-CM

## 2024-05-31 DIAGNOSIS — J4540 Moderate persistent asthma, uncomplicated: Secondary | ICD-10-CM

## 2024-05-31 DIAGNOSIS — Z8739 Personal history of other diseases of the musculoskeletal system and connective tissue: Secondary | ICD-10-CM

## 2024-05-31 MED ORDER — PREDNISONE 10 MG PO TABS: ORAL_TABLET | ORAL | 0 refills | Status: AC

## 2024-05-31 NOTE — Discharge Instructions (Signed)
 Take the prednisone  taper as prescribed to decrease inflammation May apply cool compresses to your knee Mild support with ACE wrap as needed  Use Tylenol  as needed for pain

## 2024-05-31 NOTE — ED Provider Notes (Signed)
 [facility]    CSN: [id number] Arrival date & time: 05/31/24  1133      History   Chief Complaint Chief Complaint  Patient presents with   Knee Pain    Entered by patient    HPI Suzanne Graham is a 43 y.o. female.   Patient presents for evaluation of a 3-day history of atraumatic right knee pain.  Endorses a history of gout with presentations to multiple areas of her body.  States that this is very similar to previous gout outbreaks.  The last was to her left knee approximately 3 to 4 months ago.  She takes daily colchicine .  No reported fever, body aches, or chills.  There is no other joint erythema, warmth, or tenderness noted elsewhere.  She endorses limited range of motion due to the inflammation.  She has tried Tylenol  without any alleviation of the symptoms.  Also applied an Ace wrap to help support her knee.  Reports that the steroid taper usually helps alleviate the symptoms.  The history is provided by the patient.  Knee Pain Associated symptoms: no fatigue and no fever     Past Medical History:  Diagnosis Date   Acid reflux 08/2019   Asthma    Depression    Gout    Hypertension    Stroke [facility])     Patient Active Problem List   Diagnosis Date Noted   Tonsillitis 02/20/2024   Sore throat 02/20/2024   Effusion of left knee 03/20/2023   Neck pain on right side 03/20/2023   Primary osteoarthritis of left knee 02/07/2023   Gout involving toe of right foot 02/07/2023   Bursitis of left knee 11/01/2022   Dental abscess 09/19/2022   High risk medication use 06/22/2021   Systemic inflammatory response syndrome (HCC) 03/31/2021   Inflammatory arthritis 03/29/2021   CKD (chronic kidney disease), stage III (HCC) 03/29/2021   Microcytic anemia 03/29/2021   AKI (acute kidney injury) 03/11/2021   Adverse reaction to non-steroidal anti-inflammatory drug (NSAID), initial encounter 03/11/2021   Vitamin D  deficiency 11/17/2019   Chronic gout of foot 10/11/2018    Current smoker 10/11/2018   Generalized pain 10/11/2018   Chronic nonintractable headache 08/06/2017   Essential hypertension 08/06/2017   Stroke (HCC) 07/17/2017    Past Surgical History:  Procedure Laterality Date   LOOP RECORDER INSERTION N/A 08/19/2017   Procedure: LOOP RECORDER INSERTION;  Surgeon: Kelsie Agent, MD;  Location: [facility];  Service: Cardiovascular;  Laterality: N/A;   TEE WITHOUT CARDIOVERSION N/A 08/19/2017   Procedure: TRANSESOPHAGEAL ECHOCARDIOGRAM (TEE);  Surgeon: Okey Vina GAILS, MD;  Location: [facility] ENDOSCOPY;  Service: Cardiovascular;  Laterality: N/A;    OB History     Gravida  0   Para  0   Term  0   Preterm  0   AB  0   Living  0      SAB  0   IAB  0   Ectopic  0   Multiple  0   Live Births  0            Home Medications    Prior to Admission medications   Medication Sig Start Date End Date Taking? Authorizing Provider  predniSONE  (DELTASONE ) 10 MG tablet Take 6 tablets (60 mg total) by mouth daily with breakfast for 1 day, THEN 5 tablets (50 mg total) daily with breakfast for 1 day, THEN 4 tablets (40 mg total) daily with breakfast for 1 day, THEN 3  tablets (30 mg total) daily with breakfast for 1 day, THEN 2 tablets (20 mg total) daily with breakfast for 1 day, THEN 1 tablet (10 mg total) daily with breakfast for 1 day. 05/31/24 06/06/24 Yes Janet Therisa PARAS, FNP  acetaminophen  (TYLENOL ) 500 MG tablet Take 500 mg by mouth every 6 (six) hours as needed for moderate pain or headache.    [provider]  albuterol  (VENTOLIN  HFA) 108 (90 Base) MCG/ACT inhaler Inhale 2 puffs into the lungs every 6 (six) hours as needed for wheezing or shortness of breath. 01/12/24   Oley Bascom RAMAN, NP  albuterol  (VENTOLIN  HFA) 108 (90 Base) MCG/ACT inhaler Inhale 1-2 puffs into the lungs every 6 (six) hours as needed for wheezing or shortness of breath. 03/17/24   Oley Bascom RAMAN, NP  amLODipine  (NORVASC ) 10 MG tablet TAKE 1 TABLET BY MOUTH  EVERY DAY 12/25/23   Nichols, Tonya S, NP  amoxicillin -clavulanate (AUGMENTIN ) 875-125 MG tablet Take 1 tablet by mouth 2 (two) times daily. 02/20/24   Paseda, Folashade R, FNP  budesonide -formoterol  (SYMBICORT ) 80-4.5 MCG/ACT inhaler Inhale 2 puffs into the lungs 2 (two) times daily. 03/17/24   Oley Bascom RAMAN, NP  colchicine  0.6 MG tablet TAKE 1 TABLET BY MOUTH 2 TIMES DAILY. 03/22/24   Oley Bascom RAMAN, NP  cyclobenzaprine  (FLEXERIL ) 10 MG tablet Take 1 tablet (10 mg total) by mouth 3 (three) times daily as needed for muscle spasms. 08/05/23   Oley Bascom RAMAN, NP  hydrOXYzine  (ATARAX ) 25 MG tablet Take 1 tablet (25 mg total) by mouth every 6 (six) hours. 04/01/24   Emil Share, DO  montelukast  (SINGULAIR ) 10 MG tablet Take 1 tablet (10 mg total) by mouth at bedtime. 03/17/24   Nichols, Tonya S, NP  probenecid  (BENEMID ) 500 MG tablet TAKE 1 TABLET (500 MG TOTAL) BY MOUTH 2 (TWO) TIMES DAILY. TAKE 1/2 TABLET TWICE A DAY FOR THE FIRST WEEK, THEN MAY INCREASE TO 1 TABLET TWICE DAILY Patient not taking: Reported on 05/05/2024 10/20/23 05/05/24  Burnetta Brunet, DO    Family History Family History  Problem Relation Age of Onset   Heart failure Mother    Hypertension Mother    Stroke Neg Hx     Social History Social History   Tobacco Use   Smoking status: Every Day    Current packs/day: 0.25    Average packs/day: 0.3 packs/day for 7.0 years (1.8 ttl pk-yrs)    Types: Cigarettes   Smokeless tobacco: Never  Vaping Use   Vaping status: Never Used  Substance Use Topics   Alcohol use: Yes    Comment: rare   Drug use: No     Allergies   Nsaids   Review of Systems Review of Systems  Constitutional:  Negative for fatigue and fever.  HENT:  Negative for congestion, rhinorrhea and sore throat.   Eyes:  Negative for pain and redness.  Respiratory:  Negative for cough and shortness of breath.   Cardiovascular:  Negative for chest pain.  Gastrointestinal:  Negative for abdominal pain, diarrhea  and vomiting.  Genitourinary:  Negative for dysuria.  Musculoskeletal:  Positive for arthralgias and joint swelling.  Skin:  Negative for rash.  Neurological:  Negative for dizziness and headaches.     Physical Exam Triage Vital Signs ED Triage Vitals [05/31/24 1238]  Encounter Vitals Group     BP (!) 145/97     Girls Systolic BP Percentile      Girls Diastolic BP Percentile  Boys Systolic BP Percentile      Boys Diastolic BP Percentile      Pulse Rate 86     Resp 17     Temp 98.4 F (36.9 C)     Temp src      SpO2 97 %     Weight      Height      Head Circumference      Peak Flow      Pain Score 8     Pain Loc      Pain Education      Exclude from Growth Chart    No data found.  Updated Vital Signs BP (!) 145/97   Pulse 86   Temp 98.4 F (36.9 C)   Resp 17   LMP 05/01/2024 (Exact Date)   SpO2 97%   Physical Exam Vitals and nursing note reviewed.  Constitutional:      Appearance: Normal appearance.  HENT:     Head: Normocephalic.  Cardiovascular:     Rate and Rhythm: Normal rate and regular rhythm.     Heart sounds: Normal heart sounds. No murmur heard. Pulmonary:     Breath sounds: Normal breath sounds. No wheezing, rhonchi or rales.  Abdominal:     General: Bowel sounds are normal.  Musculoskeletal:        General: Swelling and tenderness present.     Comments: Documentation by exception: Right knee: Patient has moderate tenderness to the suprapatellar region.  Moderate swelling, erythema, and warmth noted.  Limited range of motion due to discomfort-pain is worsened with flexion.  Neurovascular status is intact distally.  There are no external skin changes noted such as abrasion, ecchymosis, or skin lesions.  Skin:    General: Skin is warm and dry.  Neurological:     General: No focal deficit present.     Mental Status: She is alert and oriented to person, place, and time.  Psychiatric:        Mood and Affect: Mood normal.        Behavior:  Behavior normal.        Thought Content: Thought content normal.        Judgment: Judgment normal.      UC Treatments / Results  Labs (all labs ordered are listed, but only abnormal results are displayed) Labs Reviewed - No data to display  EKG   Radiology No results found.  Procedures Procedures (including critical care time)  Medications Ordered in UC Medications - No data to display  Initial Impression / Assessment and Plan / UC Course  I have reviewed the triage vital signs and the nursing notes.  Pertinent labs & imaging results that were available during my care of the patient were reviewed by me and considered in my medical decision making (see chart for details).    Patient presents with a 3-day history of atraumatic right knee pain.  History of gout and reports that the symptoms are very similar.  On physical exam, the right knee is moderately tender, erythematous, warm, and with limited range of motion due to the discomfort.  She is afebrile and without systemic symptoms at this time.  Therefore, this is most likely related to a gout flare rather than a septic joint.  Will start patient on a steroid taper as she states that usually alleviates the symptoms.  Patient was also provided a new Ace wrap.  May continue to use Tylenol  and apply cool compresses.  Close observation for  any new or worsening symptoms. Final Clinical Impressions(s) / UC Diagnoses   Final diagnoses:  Acute pain of right knee  History of gout     Discharge Instructions      Take the prednisone  taper as prescribed to decrease inflammation May apply cool compresses to your knee Mild support with ACE wrap as needed  Use Tylenol  as needed for pain    ED Prescriptions     Medication Sig Dispense Auth. Provider   predniSONE  (DELTASONE ) 10 MG tablet Take 6 tablets (60 mg total) by mouth daily with breakfast for 1 day, THEN 5 tablets (50 mg total) daily with breakfast for 1 day, THEN 4 tablets  (40 mg total) daily with breakfast for 1 day, THEN 3 tablets (30 mg total) daily with breakfast for 1 day, THEN 2 tablets (20 mg total) daily with breakfast for 1 day, THEN 1 tablet (10 mg total) daily with breakfast for 1 day. 21 tablet Janet Therisa PARAS, FNP      PDMP not reviewed this encounter.   Janet Therisa PARAS, FNP 05/31/24 [phone]

## 2024-05-31 NOTE — ED Triage Notes (Signed)
 Pt present with constant rt knee pain x 3 days. Pt states she thinks she has a gout flare up. Pt reports heat to the touch at her knee. Pt has taken Tylenol  for temporary relief. States she cannot bend her knee.

## 2024-05-31 NOTE — Telephone Encounter (Signed)
 FYI Only or Action Required?: FYI only for provider: appointment scheduled on 05/31/2024 at [facility].  Patient was last seen in primary care on 05/05/2024 by Suzanne Bascom RAMAN, NP.  Called Nurse Triage reporting Pain.  Symptoms began x3 days.  Interventions attempted: Nothing.  Symptoms are: gradually worsening.  Triage Disposition: See Physician Within 24 Hours  Patient/caregiver understands and will follow disposition?: Yes    Copied from CRM #[id number]. Topic: Clinical - Red Word Triage >> May 31, 2024  8:08 AM Berwyn MATSU wrote: Red Word that prompted transfer to Nurse Triage: gout flare up in knee painful  on day 3. Reason for Disposition  [1] Painful rash AND [2] multiple small blisters grouped together (i.e., dermatomal distribution or band or stripe)  Answer Assessment - Initial Assessment Questions 1. ONSET: When did the pain start?      X 3 day 2. LOCATION: Where is the pain located?      Right knee 3. PAIN: How bad is the pain?    (Scale 1-10; or mild, moderate, severe)     10/10 4. WORK OR EXERCISE: Has there been any recent work or exercise that involved this part of the body?      na 5. CAUSE: What do you think is causing the leg pain?     gout 6. OTHER SYMPTOMS: Do you have any other symptoms? (e.g., chest pain, back pain, breathing difficulty, swelling, rash, fever, numbness, weakness)     Red, swelling, hot to touch - not able to bend knee 7. PREGNANCY: Is there any chance you are pregnant? When was your last menstrual period?     Na   No available appt today therefore scheduled pt an appt at [facility] per patient request.  Protocols used: Leg Pain-A-AH

## 2024-06-01 NOTE — Telephone Encounter (Signed)
 Please advise [facility]

## 2024-06-01 NOTE — Telephone Encounter (Signed)
 Please see message below

## 2024-06-02 ENCOUNTER — Other Ambulatory Visit: Payer: Self-pay | Admitting: Nurse Practitioner

## 2024-06-02 DIAGNOSIS — R0602 Shortness of breath: Secondary | ICD-10-CM

## 2024-06-02 NOTE — Telephone Encounter (Signed)
 Copied from CRM #[id number]. Topic: Clinical - Medication Refill >> Jun 02, 2024  4:42 PM Everette C wrote: Medication: albuterol  (VENTOLIN  HFA) 108 (90 Base) MCG/ACT inhaler [[id number]]  Has the patient contacted their pharmacy? Yes (Agent: If no, request that the patient contact the pharmacy for the refill. If patient does not wish to contact the pharmacy document the reason why and proceed with request.) (Agent: If yes, when and what did the pharmacy advise?)  This is the patient's preferred pharmacy:  [id number] [address] [place], [place] - [address] [place] [address] Phone: [phone] Fax: [phone]  Is this the correct pharmacy for this prescription? Yes If no, delete pharmacy and type the correct one.   Has the prescription been filled recently? Yes  Is the patient out of the medication? Yes  Has the patient been seen for an appointment in the last year OR does the patient have an upcoming appointment? Yes  Can we respond through MyChart? No  Agent: Please be advised that Rx refills may take up to 3 business days. We ask that you follow-up with your pharmacy.

## 2024-06-07 MED ORDER — ALBUTEROL SULFATE HFA 108 (90 BASE) MCG/ACT IN AERS: 2.0000 | INHALATION_SPRAY | Freq: Four times a day (QID) | RESPIRATORY_TRACT | 2 refills | Status: AC | PRN

## 2024-06-07 NOTE — Telephone Encounter (Signed)
 Please advise [facility]

## 2024-07-12 ENCOUNTER — Emergency Department (HOSPITAL_COMMUNITY)
Admission: EM | Admit: 2024-07-12 | Discharge: 2024-07-12 | Disposition: A | Discharge status: Home / Self Care | Payer: Self-pay | Attending: Emergency Medicine | Admitting: Emergency Medicine

## 2024-07-12 DIAGNOSIS — M10071 Idiopathic gout, right ankle and foot: Secondary | ICD-10-CM | POA: Insufficient documentation

## 2024-07-12 DIAGNOSIS — M109 Gout, unspecified: Secondary | ICD-10-CM

## 2024-07-12 MED ORDER — OXYCODONE-ACETAMINOPHEN 5-325 MG PO TABS
1.0000 | ORAL_TABLET | ORAL | Status: DC | PRN
  Administered 2024-07-12: 1 via ORAL
  Filled 2024-07-12: qty 1

## 2024-07-12 MED ORDER — PREDNISONE 20 MG PO TABS: 40.0000 mg | ORAL_TABLET | Freq: Every day | ORAL | 0 refills | Status: DC

## 2024-07-12 MED ORDER — PREDNISONE 20 MG PO TABS
40.0000 mg | ORAL_TABLET | Freq: Once | ORAL | Status: AC
  Administered 2024-07-12: 40 mg via ORAL
  Filled 2024-07-12: qty 2

## 2024-07-12 NOTE — ED Triage Notes (Signed)
 Pt c/o pain in her right foot that started 2 days ago, hx gout

## 2024-07-12 NOTE — ED Provider Notes (Signed)
 " [place] EMERGENCY DEPARTMENT AT [facility] Provider Note   CSN: [id number] Arrival date & time: 07/12/24  1133     Patient presents with: Claudication   Suzanne Graham is a 44 y.o. female.   HPI   This patient is a 44 year old female presenting with right foot pain, this involves the dorsum of the foot as well as the ankle, she states this is similar to prior gout flareups.  She has had no fevers no trauma but has had mild swelling in those areas and it is severely painful and tender with any range of motion thus she has wrapped the foot in an Ace wrap and is using crutches to walk.  She takes colchicine  daily and has for quite some time at the request of her physician as a prevention for gout.  She states the symptoms started about 3 days ago  Prior to Admission medications  Medication Sig Start Date End Date Taking? Authorizing Provider  predniSONE  (DELTASONE ) 20 MG tablet Take 2 tablets (40 mg total) by mouth daily. 07/12/24  Yes Cleotilde Rogue, MD  acetaminophen  (TYLENOL ) 500 MG tablet Take 500 mg by mouth every 6 (six) hours as needed for moderate pain or headache.    [provider]  albuterol  (VENTOLIN  HFA) 108 (90 Base) MCG/ACT inhaler INHALE 1-2 PUFFS BY MOUTH EVERY 6 HOURS AS NEEDED FOR WHEEZE OR SHORTNESS OF BREATH 06/05/24   Oley Bascom RAMAN, NP  albuterol  (VENTOLIN  HFA) 108 (90 Base) MCG/ACT inhaler Inhale 2 puffs into the lungs every 6 (six) hours as needed for wheezing or shortness of breath. 06/07/24   Oley Bascom RAMAN, NP  amLODipine  (NORVASC ) 10 MG tablet TAKE 1 TABLET BY MOUTH EVERY DAY 12/25/23   Nichols, Tonya S, NP  amoxicillin -clavulanate (AUGMENTIN ) 875-125 MG tablet Take 1 tablet by mouth 2 (two) times daily. 02/20/24   Paseda, Folashade R, FNP  budesonide -formoterol  (SYMBICORT ) 80-4.5 MCG/ACT inhaler Inhale 2 puffs into the lungs 2 (two) times daily. 03/17/24   Oley Bascom RAMAN, NP  colchicine  0.6 MG tablet TAKE 1 TABLET BY MOUTH 2 TIMES DAILY.  03/22/24   Oley Bascom RAMAN, NP  cyclobenzaprine  (FLEXERIL ) 10 MG tablet Take 1 tablet (10 mg total) by mouth 3 (three) times daily as needed for muscle spasms. 08/05/23   Oley Bascom RAMAN, NP  hydrOXYzine  (ATARAX ) 25 MG tablet Take 1 tablet (25 mg total) by mouth every 6 (six) hours. 04/01/24   Emil Share, DO  montelukast  (SINGULAIR ) 10 MG tablet Take 1 tablet (10 mg total) by mouth at bedtime. 03/17/24   Oley Bascom RAMAN, NP  probenecid  (BENEMID ) 500 MG tablet TAKE 1 TABLET (500 MG TOTAL) BY MOUTH 2 (TWO) TIMES DAILY. TAKE 1/2 TABLET TWICE A DAY FOR THE FIRST WEEK, THEN MAY INCREASE TO 1 TABLET TWICE DAILY Patient not taking: Reported on 05/05/2024 10/20/23 05/05/24  Brooks, Dana, DO    Allergies: Nsaids    Review of Systems  All other systems reviewed and are negative.   Updated Vital Signs BP (!) 143/99   Pulse (!) 102   Temp 98.4 F (36.9 C)   Resp 14   SpO2 100%   Physical Exam Vitals and nursing note reviewed.  Constitutional:      Appearance: She is well-developed. She is not diaphoretic.  HENT:     Head: Normocephalic and atraumatic.  Eyes:     General:        Right eye: No discharge.        Left  eye: No discharge.     Conjunctiva/sclera: Conjunctivae normal.  Pulmonary:     Effort: Pulmonary effort is normal. No respiratory distress.  Musculoskeletal:     Comments: The patient has her right ankle wrapped in an Ace wrap and has had edema of the toes on the right side as well as the distal foot.  I have taken down the Ace wrap and there appears to be good pulses, decreased range of motion of the ankle, there is no significant swelling, she has pain with even small range of motion of the ankle and the midfoot.  Skin:    General: Skin is warm and dry.     Findings: No erythema or rash.  Neurological:     Mental Status: She is alert.     Coordination: Coordination normal.     (all labs ordered are listed, but only abnormal results are displayed) Labs Reviewed - No data  to display  EKG: None  Radiology: No results found.   Procedures   Medications Ordered in the ED  predniSONE  (DELTASONE ) tablet 40 mg (has no administration in time range)                                    Medical Decision Making Risk Prescription drug management.   Exam consistent with gouty arthritis  Patient seen for right knee pain in November, seen in September for asthma, followed in the office for gout, has practitioner that she sees, recommended stopping colchicine  and starting prednisone  and follow-up outpatient, she is agreeable, vital signs unremarkable, she is not tachycardic on my exam and she is not febrile, doubt septic joint     Final diagnoses:  Acute gout of right foot, unspecified cause    ED Discharge Orders          Ordered    predniSONE  (DELTASONE ) 20 MG tablet  Daily        07/12/24 1230               Cleotilde Rogue, MD 07/12/24 1233  "

## 2024-07-12 NOTE — Discharge Instructions (Signed)
 Please do not take colchicine  every day, this can hurt your kidneys and possibly your liver if this continues.  I do want you to follow-up with your family doctor within 1 week, I have sent in prednisone  daily for 5 days  Thank you for allowing us  to treat you in the emergency department today.  After reviewing your examination and potential testing that was done it appears that you are safe to go home.  I would like for you to follow-up with your doctor within the next several days, have them obtain your records and follow-up with them to review all potential tests and results from your visit.  If you should develop severe or worsening symptoms return to the emergency department immediately

## 2024-07-19 ENCOUNTER — Ambulatory Visit: Payer: Self-pay | Admitting: Nurse Practitioner

## 2024-07-19 ENCOUNTER — Telehealth: Payer: Self-pay | Admitting: Nurse Practitioner

## 2024-07-19 NOTE — Telephone Encounter (Signed)
 Unable to convert CRM from patient calls folder to nurse triage encounter. Signing to close, See nurse triage encounter today 07/19/24 for details of call.

## 2024-07-19 NOTE — Telephone Encounter (Signed)
 3rd call-back attempt. Left non-detailed VM to call back.     Copied from telephone encounter in patient calls folder due to unable to convert to nurse triage encounter.  Copied from CRM #[id number]. Topic: Clinical - Medication Refill >> Jul 19, 2024 12:22 PM Zebedee SAUNDERS wrote: Medication: predniSONE  (DELTASONE ) 20 MG tablet   Has the patient contacted their pharmacy? Yes (Agent: If no, request that the patient contact the pharmacy for the refill. If patient does not wish to contact the pharmacy document the reason why and proceed with request.) (Agent: If yes, when and what did the pharmacy advise?)   This is the patient's preferred pharmacy:  [id number] [address], [place] - [address] [place] [address] Phone: [phone] Fax: [phone]   Is this the correct pharmacy for this prescription? Yes If no, delete pharmacy and type the correct one.    Has the prescription been filled recently? Yes   Is the patient out of the medication? Yes   Has the patient been seen for an appointment in the last year OR does the patient have an upcoming appointment? Yes   Can we respond through MyChart? Yes   Agent: Please be advised that Rx refills may take up to 3 business days. We ask that you follow-up with your pharmacy.

## 2024-07-19 NOTE — Telephone Encounter (Signed)
" ° ° ° °  Copied from telephone encounter in patient calls folder due to unable to convert to nurse triage encounter.  Copied from CRM #[id number]. Topic: Clinical - Medication Refill >> Jul 19, 2024 12:22 PM Zebedee SAUNDERS wrote: Medication: predniSONE  (DELTASONE ) 20 MG tablet   Has the patient contacted their pharmacy? Yes (Agent: If no, request that the patient contact the pharmacy for the refill. If patient does not wish to contact the pharmacy document the reason why and proceed with request.) (Agent: If yes, when and what did the pharmacy advise?)   This is the patient's preferred pharmacy:  [id number] [address], [place] - [address] [place] [address] Phone: [phone] Fax: [phone]   Is this the correct pharmacy for this prescription? Yes If no, delete pharmacy and type the correct one.    Has the prescription been filled recently? Yes   Is the patient out of the medication? Yes   Has the patient been seen for an appointment in the last year OR does the patient have an upcoming appointment? Yes   Can we respond through MyChart? Yes   Agent: Please be advised that Rx refills may take up to 3 business days. We ask that you follow-up with your pharmacy.    "

## 2024-07-19 NOTE — Telephone Encounter (Unsigned)
 Copied from CRM #[id number]. Topic: Clinical - Medication Refill >> Jul 19, 2024 12:22 PM Zebedee SAUNDERS wrote: Medication: predniSONE  (DELTASONE ) 20 MG tablet  Has the patient contacted their pharmacy? Yes (Agent: If no, request that the patient contact the pharmacy for the refill. If patient does not wish to contact the pharmacy document the reason why and proceed with request.) (Agent: If yes, when and what did the pharmacy advise?)  This is the patient's preferred pharmacy:  [id number] [address], [place] - [address] [place] [address] Phone: [phone] Fax: [phone]  Is this the correct pharmacy for this prescription? Yes If no, delete pharmacy and type the correct one.   Has the prescription been filled recently? Yes  Is the patient out of the medication? Yes  Has the patient been seen for an appointment in the last year OR does the patient have an upcoming appointment? Yes  Can we respond through MyChart? Yes  Agent: Please be advised that Rx refills may take up to 3 business days. We ask that you follow-up with your pharmacy.

## 2024-07-20 ENCOUNTER — Other Ambulatory Visit: Payer: Self-pay

## 2024-07-20 MED ORDER — PREDNISONE 20 MG PO TABS: 40.0000 mg | ORAL_TABLET | Freq: Every day | ORAL | 0 refills | Status: AC

## 2024-07-20 MED ORDER — PREDNISONE 20 MG PO TABS: 40.0000 mg | ORAL_TABLET | Freq: Every day | ORAL | 0 refills | Status: DC

## 2024-07-20 NOTE — Telephone Encounter (Signed)
 Request sent to the provider. KH

## 2024-07-20 NOTE — Telephone Encounter (Signed)
 Please Advise

## 2024-07-20 NOTE — Telephone Encounter (Signed)
 Please advise [facility]

## 2024-08-04 ENCOUNTER — Ambulatory Visit: Payer: Self-pay

## 2024-08-04 ENCOUNTER — Encounter: Payer: Self-pay | Admitting: Nurse Practitioner

## 2024-08-04 ENCOUNTER — Ambulatory Visit: Payer: Self-pay | Admitting: Nurse Practitioner

## 2024-08-04 VITALS — BP 123/87 | HR 85 | Wt 175.0 lb

## 2024-08-04 DIAGNOSIS — N179 Acute kidney failure, unspecified: Secondary | ICD-10-CM

## 2024-08-04 DIAGNOSIS — K0889 Other specified disorders of teeth and supporting structures: Secondary | ICD-10-CM | POA: Insufficient documentation

## 2024-08-04 DIAGNOSIS — K047 Periapical abscess without sinus: Secondary | ICD-10-CM

## 2024-08-04 DIAGNOSIS — Z8739 Personal history of other diseases of the musculoskeletal system and connective tissue: Secondary | ICD-10-CM | POA: Insufficient documentation

## 2024-08-04 DIAGNOSIS — D649 Anemia, unspecified: Secondary | ICD-10-CM

## 2024-08-04 DIAGNOSIS — L089 Local infection of the skin and subcutaneous tissue, unspecified: Secondary | ICD-10-CM | POA: Insufficient documentation

## 2024-08-04 MED ORDER — AMOXICILLIN-POT CLAVULANATE 875-125 MG PO TABS: 1.0000 | ORAL_TABLET | Freq: Two times a day (BID) | ORAL | 0 refills | Status: AC

## 2024-08-04 MED ORDER — ALLOPURINOL 100 MG PO TABS: 50.0000 mg | ORAL_TABLET | Freq: Every day | ORAL | 1 refills | Status: AC

## 2024-08-04 NOTE — Patient Instructions (Addendum)
 1. Pain, dental (Primary) - amoxicillin -clavulanate (AUGMENTIN ) 875-125 MG tablet; Take 1 tablet by mouth 2 (two) times daily.  Dispense: 20 tablet; Refill: 0  2. Soft tissue infection - amoxicillin -clavulanate (AUGMENTIN ) 875-125 MG tablet; Take 1 tablet by mouth 2 (two) times daily.  Dispense: 20 tablet; Refill: 0  3. History of gout - Uric Acid - allopurinol  (ZYLOPRIM ) 100 MG tablet; Take 0.5 tablets (50 mg total) by mouth daily.  Dispense: 60 tablet; Refill: 1  4. AKI (acute kidney injury) - Basic Metabolic Panel    It is important that you exercise regularly at least 30 minutes 5 times a week as tolerated  Think about what you will eat, plan ahead. Choose  clean, green, fresh or frozen over canned, processed or packaged foods which are more sugary, salty and fatty. 70 to 75% of food eaten should be vegetables and fruit. Three meals at set times with snacks allowed between meals, but they must be fruit or vegetables. Aim to eat over a 12 hour period , example 7 am to 7 pm, and STOP after  your last meal of the day. Drink water,generally about 64 ounces per day, no other drink is as healthy. Fruit juice is best enjoyed in a healthy way, by EATING the fruit.  Thanks for choosing [facility] we consider it a privelige to serve you.

## 2024-08-04 NOTE — Assessment & Plan Note (Signed)
 Lab Results  Component Value Date   NA 138 05/05/2024   K 4.5 05/05/2024   CO2 18 (L) 05/05/2024   GLUCOSE 76 05/05/2024   BUN 22 05/05/2024   CREATININE 1.66 (H) 05/05/2024   CALCIUM  9.6 05/05/2024   EGFR 39 (L) 05/05/2024   GFRNONAA >60 04/01/2024   Previous referral to nephrology. Kidney function slightly lower than normal. - Ensure adequate hydration with at least 64 ounces of water daily. - Avoid NSAIDs such as Aleve  and ibuprofen . - Continue follow-up with nephrology appointment.

## 2024-08-04 NOTE — Assessment & Plan Note (Signed)
 Lab Results  Component Value Date   WBC 6.8 05/05/2024   HGB 10.1 (L) 05/05/2024   HCT 32.9 (L) 05/05/2024   MCV 84 05/05/2024   PLT 287 05/05/2024   No abnormal bleeding reported. Menstrual cycles regular. - Ordered CBC and iron level tests. - Advised on potential need for iron supplementation based on lab results.

## 2024-08-04 NOTE — Progress Notes (Signed)
 "  Acute Office Visit  Subjective:     Patient ID: Suzanne Graham, female    DOB: [birth year], 44 y.o.   MRN: [id number]  Chief Complaint  Patient presents with   Dental Pain    HPI    Discussed the use of AI scribe software for clinical note transcription with the patient, who gave verbal consent to proceed.  History of Present Illness Suzanne Graham is a 44 year old female  has a past medical history of Acid reflux (08/2019), Asthma, Depression, Gout, Hypertension, and Stroke (HCC).  who presents with facial swelling , and dental pain due to a suspected abscess.  She has been experiencing sudden onset right sided facial swelling and dental pain since yesterday, suspected to be due to an abscess. The swelling was noted upon waking this morning, with no prior pain before yesterday. She experienced similar issues approximately a year ago but did not seek dental care at that time.  She has a history of gout, with the last attack occurring on her right foot. She was previously on probenecid  which she  ran out of.  She has a history. She uses colchicine  as needed for gout attacks but was advised against daily use due to potential kidney issues. Gout flares up when not on medication. She has not been on preventive medication for gout recently and has not seen her orthopedic doctor since last year.  She has a history of hypertension and is currently taking amlodipine  10 mg daily. She reports a history of kidney issues, with a referral to a kidney specialist already made.  She reports anemia but denies any abnormal bleeding, noting her menstrual cycle lasts three to four days each month. She is not taking iron supplements.  No fever, chills, chest pain, or shortness of breath. She cannot take ibuprofen  due to previous issues and uses Tylenol  650 mg every six hours as needed for pain relief.  Assessment & Plan        Review of Systems  Constitutional:  Negative for appetite change,  chills, fatigue and fever.  HENT:  Positive for dental problem and facial swelling. Negative for congestion, postnasal drip, rhinorrhea and sneezing.   Respiratory:  Negative for cough, shortness of breath and wheezing.   Cardiovascular:  Negative for chest pain, palpitations and leg swelling.  Gastrointestinal:  Negative for abdominal pain, constipation, nausea and vomiting.  Genitourinary:  Negative for difficulty urinating, dysuria, flank pain and frequency.  Musculoskeletal:  Negative for arthralgias, back pain, joint swelling and myalgias.  Skin:  Negative for color change, pallor, rash and wound.  Neurological:  Negative for dizziness, facial asymmetry, weakness, numbness and headaches.  Psychiatric/Behavioral:  Negative for behavioral problems, confusion, self-injury and suicidal ideas.         Objective:    BP 123/87   Pulse 85   Wt 175 lb (79.4 kg)   SpO2 100%   BMI 30.04 kg/m    Physical Exam Vitals and nursing note reviewed.  Constitutional:      General: She is not in acute distress.    Appearance: Normal appearance. She is obese. She is not ill-appearing, toxic-appearing or diaphoretic.  HENT:     Mouth/Throat:     Mouth: Mucous membranes are moist.     Dentition: Gingival swelling and dental caries present.     Pharynx: Oropharynx is clear. No oropharyngeal exudate or posterior oropharyngeal erythema.     Comments: Face swelling and tenderness  on the right Eyes:  General: No scleral icterus.       Right eye: No discharge.        Left eye: No discharge.     Extraocular Movements: Extraocular movements intact.     Conjunctiva/sclera: Conjunctivae normal.  Cardiovascular:     Rate and Rhythm: Normal rate and regular rhythm.     Pulses: Normal pulses.     Heart sounds: Normal heart sounds. No murmur heard.    No friction rub. No gallop.  Pulmonary:     Effort: Pulmonary effort is normal. No respiratory distress.     Breath sounds: Normal breath sounds. No  stridor. No wheezing, rhonchi or rales.  Chest:     Chest wall: No tenderness.  Abdominal:     General: There is no distension.     Palpations: Abdomen is soft.     Tenderness: There is no abdominal tenderness. There is no right CVA tenderness, left CVA tenderness or guarding.  Musculoskeletal:        General: No swelling, tenderness, deformity or signs of injury.     Right lower leg: No edema.     Left lower leg: No edema.  Skin:    General: Skin is warm and dry.     Capillary Refill: Capillary refill takes less than 2 seconds.     Coloration: Skin is not jaundiced or pale.     Findings: Lesion present. No bruising or erythema.     Comments: A small boil noted on the left cheek   Neurological:     Mental Status: She is alert and oriented to person, place, and time.     Motor: No weakness.     Coordination: Coordination normal.     Gait: Gait normal.  Psychiatric:        Mood and Affect: Mood normal.        Behavior: Behavior normal.        Thought Content: Thought content normal.        Judgment: Judgment normal.     No results found for any visits on 08/04/24.      Assessment & Plan:   Problem List Items Addressed This Visit       Digestive   Dental abscess - Primary   Dental abscess with soft tissue infection Acute dental abscess with soft tissue infection, presenting with swelling and pain. No systemic symptoms. Previous similar episodes noted. - Prescribed Augmentin   twice a day for 10 days. - Advised warm compress application. - Referred to dentist for further evaluation.  Provided a list of dentist - Scheduled follow-up appointment in one week to assess response to treatment.       Relevant Medications   amoxicillin -clavulanate (AUGMENTIN ) 875-125 MG tablet     Genitourinary   AKI (acute kidney injury)   Lab Results  Component Value Date   NA 138 05/05/2024   K 4.5 05/05/2024   CO2 18 (L) 05/05/2024   GLUCOSE 76 05/05/2024   BUN 22 05/05/2024    CREATININE 1.66 (H) 05/05/2024   CALCIUM  9.6 05/05/2024   EGFR 39 (L) 05/05/2024   GFRNONAA >60 04/01/2024   Previous referral to nephrology. Kidney function slightly lower than normal. - Ensure adequate hydration with at least 64 ounces of water daily. - Avoid NSAIDs such as Aleve  and ibuprofen . - Continue follow-up with nephrology appointment.       Relevant Orders   Basic Metabolic Panel     Other   Anemia   Lab Results  Component  Value Date   WBC 6.8 05/05/2024   HGB 10.1 (L) 05/05/2024   HCT 32.9 (L) 05/05/2024   MCV 84 05/05/2024   PLT 287 05/05/2024   No abnormal bleeding reported. Menstrual cycles regular. - Ordered CBC and iron level tests. - Advised on potential need for iron supplementation based on lab results.      Relevant Orders   CBC   Iron, TIBC and Ferritin Panel   Soft tissue infection   Take Tylenol  650 mg every 6 hours as needed for pain.  Use of warm compress advised Augmentin  prescribed      Relevant Medications   amoxicillin -clavulanate (AUGMENTIN ) 875-125 MG tablet   History of gout    Recent flare-up in the right foot. Previous colchicine  use led to kidney issues. Uric acid levels previously elevated. Alcohol and high-purine foods may exacerbate symptoms. - Prescribed allopurinol  50 mg daily (half of a 100 mg tablet). -Discontinue colchicine  - Ordered uric acid level test. - Advised on dietary modifications: limit alcohol, avoid high-purine foods, and maintain hydration. - Discussed potential use of prednisone  for future outbreaks.       Relevant Medications   allopurinol  (ZYLOPRIM ) 100 MG tablet   Other Relevant Orders   Uric Acid    Meds ordered this encounter  Medications   amoxicillin -clavulanate (AUGMENTIN ) 875-125 MG tablet    Sig: Take 1 tablet by mouth 2 (two) times daily.    Dispense:  20 tablet    Refill:  0   allopurinol  (ZYLOPRIM ) 100 MG tablet    Sig: Take 0.5 tablets (50 mg total) by mouth daily.    Dispense:  60  tablet    Refill:  1    Return in about 1 week (around 08/11/2024) for dental infection .  Mirissa Lopresti R Dhruvan Gullion, FNP  "

## 2024-08-04 NOTE — Telephone Encounter (Signed)
 FYI Only or Action Required?: FYI only for provider: appointment scheduled on 08/04/24.  Patient was last seen in primary care on 05/05/2024 by Oley Bascom RAMAN, NP.  Called Nurse Triage reporting Abscess and Dental Pain.  Symptoms began yesterday.  Interventions attempted: Nothing.  Symptoms are: unchanged.  Triage Disposition: Call Dentist When Office is Open  Patient/caregiver understands and will follow disposition?: Yes   Message from Hadassah PARAS sent at 08/04/2024 12:39 PM EST  Reason for Triage: Swollen gums,possibly abscess on gums, sore throat. Pt is req antibiotics. Transfer to [facility]   Reason for Disposition  Red or yellow lump present at the gum line of the painful tooth  Answer Assessment - Initial Assessment Questions Scheduled 08/04/24 Advised call back or ED/911 if symptoms worsen. Patient verbalized understanding.  Advised to call dentist today.    1. LOCATION: Which tooth is hurting?  (e.g., right-side/left-side, upper/lower, front/back)    Right bottom, redness, swollen gums, jaw swollen, sore throat  2. ONSET: When did the toothache start?  (e.g., hours, days)      yesterday 3. SEVERITY: How bad is the toothache?  (Scale 1-10; mild, moderate or severe)     8/10; tylenol  4. SWELLING: Is there any visible swelling of your face?     Jaw; right side 5. OTHER SYMPTOMS: Do you have any other symptoms? (e.g., fever)     Denies diff breathing, chest pain, faint, cold/clammy, fever chills, n/v, HA  Protocols used: Toothache-A-AH

## 2024-08-04 NOTE — Assessment & Plan Note (Signed)
 Dental abscess with soft tissue infection Acute dental abscess with soft tissue infection, presenting with swelling and pain. No systemic symptoms. Previous similar episodes noted. - Prescribed Augmentin   twice a day for 10 days. - Advised warm compress application. - Referred to dentist for further evaluation.  Provided a list of dentist - Scheduled follow-up appointment in one week to assess response to treatment.

## 2024-08-04 NOTE — Assessment & Plan Note (Signed)
" °  Recent flare-up in the right foot. Previous colchicine  use led to kidney issues. Uric acid levels previously elevated. Alcohol and high-purine foods may exacerbate symptoms. - Prescribed allopurinol  50 mg daily (half of a 100 mg tablet). -Discontinue colchicine  - Ordered uric acid level test. - Advised on dietary modifications: limit alcohol, avoid high-purine foods, and maintain hydration. - Discussed potential use of prednisone  for future outbreaks.  "

## 2024-08-04 NOTE — Assessment & Plan Note (Signed)
 Take Tylenol  650 mg every 6 hours as needed for pain.  Use of warm compress advised Augmentin  prescribed

## 2024-08-05 LAB — CBC
Hematocrit: 34.2 % (ref 34.0–46.6)
Hemoglobin: 10.5 g/dL — ABNORMAL LOW (ref 11.1–15.9)
MCH: 24.8 pg — ABNORMAL LOW (ref 26.6–33.0)
MCHC: 30.7 g/dL — ABNORMAL LOW (ref 31.5–35.7)
MCV: 81 fL (ref 79–97)
Platelets: 259 10*3/uL (ref 150–450)
RBC: 4.24 x10E6/uL (ref 3.77–5.28)
RDW: 14.1 % (ref 11.7–15.4)
WBC: 9.1 10*3/uL (ref 3.4–10.8)

## 2024-08-05 LAB — BASIC METABOLIC PANEL WITH GFR
BUN/Creatinine Ratio: 13 (ref 9–23)
BUN: 13 mg/dL (ref 6–24)
CO2: 23 mmol/L (ref 20–29)
Calcium: 9.5 mg/dL (ref 8.7–10.2)
Chloride: 104 mmol/L (ref 96–106)
Creatinine, Ser: 1 mg/dL (ref 0.57–1.00)
Glucose: 86 mg/dL (ref 70–99)
Potassium: 4.1 mmol/L (ref 3.5–5.2)
Sodium: 143 mmol/L (ref 134–144)
eGFR: 71 mL/min/{1.73_m2}

## 2024-08-05 LAB — URIC ACID: Uric Acid: 9.8 mg/dL — ABNORMAL HIGH (ref 2.6–6.2)

## 2024-08-05 LAB — IRON,TIBC AND FERRITIN PANEL
Ferritin: 112 ng/mL (ref 15–150)
Iron Saturation: 27 % (ref 15–55)
Iron: 64 ug/dL (ref 27–159)
Total Iron Binding Capacity: 238 ug/dL — ABNORMAL LOW (ref 250–450)
UIBC: 174 ug/dL (ref 131–425)

## 2024-08-05 NOTE — Telephone Encounter (Signed)
 Pt was seen yesterday and list of dental office was also provided. Suzanne Graham

## 2024-08-06 ENCOUNTER — Ambulatory Visit: Payer: Self-pay | Admitting: Nurse Practitioner

## 2024-08-18 ENCOUNTER — Ambulatory Visit: Payer: Self-pay | Admitting: Nurse Practitioner

## 2024-11-03 ENCOUNTER — Ambulatory Visit: Payer: Self-pay | Admitting: Nurse Practitioner
